# Patient Record
Sex: Female | Born: 1935 | Race: White | Hispanic: No | Smoking: Never smoker
Health system: Southern US, Community
[De-identification: ages and names within clinical notes are randomized; demographics above are authoritative.]

## PROBLEM LIST (undated history)

## (undated) DIAGNOSIS — K5792 Diverticulitis of intestine, part unspecified, without perforation or abscess without bleeding: Secondary | ICD-10-CM

## (undated) DIAGNOSIS — J449 Chronic obstructive pulmonary disease, unspecified: Secondary | ICD-10-CM

## (undated) DIAGNOSIS — I2510 Atherosclerotic heart disease of native coronary artery without angina pectoris: Secondary | ICD-10-CM

## (undated) DIAGNOSIS — H3530 Unspecified macular degeneration: Secondary | ICD-10-CM

## (undated) DIAGNOSIS — N189 Chronic kidney disease, unspecified: Secondary | ICD-10-CM

## (undated) DIAGNOSIS — I480 Paroxysmal atrial fibrillation: Secondary | ICD-10-CM

## (undated) DIAGNOSIS — A0472 Enterocolitis due to Clostridium difficile, not specified as recurrent: Secondary | ICD-10-CM

## (undated) DIAGNOSIS — K219 Gastro-esophageal reflux disease without esophagitis: Secondary | ICD-10-CM

## (undated) DIAGNOSIS — I10 Essential (primary) hypertension: Secondary | ICD-10-CM

## (undated) DIAGNOSIS — R001 Bradycardia, unspecified: Secondary | ICD-10-CM

## (undated) DIAGNOSIS — I5032 Chronic diastolic (congestive) heart failure: Secondary | ICD-10-CM

## (undated) HISTORY — DX: Atherosclerotic heart disease of native coronary artery without angina pectoris: I25.10

## (undated) HISTORY — DX: Chronic kidney disease, unspecified: N18.9

## (undated) HISTORY — DX: Chronic obstructive pulmonary disease, unspecified: J44.9

## (undated) HISTORY — DX: Chronic diastolic (congestive) heart failure: I50.32

## (undated) HISTORY — PX: APPENDECTOMY: SHX54

## (undated) HISTORY — PX: VAGINAL HYSTERECTOMY: SUR661

## (undated) HISTORY — DX: Bradycardia, unspecified: R00.1

## (undated) HISTORY — DX: Enterocolitis due to Clostridium difficile, not specified as recurrent: A04.72

## (undated) HISTORY — PX: CHOLECYSTECTOMY: SHX55

## (undated) HISTORY — PX: EYE SURGERY: SHX253

## (undated) HISTORY — DX: Paroxysmal atrial fibrillation: I48.0

---

## 1997-12-08 ENCOUNTER — Ambulatory Visit (HOSPITAL_COMMUNITY): Admission: RE | Admit: 1997-12-08 | Discharge: 1997-12-08 | Payer: Self-pay | Admitting: Internal Medicine

## 1997-12-08 ENCOUNTER — Emergency Department (HOSPITAL_COMMUNITY): Admission: EM | Admit: 1997-12-08 | Discharge: 1997-12-08 | Payer: Self-pay

## 1998-05-21 ENCOUNTER — Emergency Department (HOSPITAL_COMMUNITY): Admission: EM | Admit: 1998-05-21 | Discharge: 1998-05-21 | Payer: Self-pay | Admitting: Emergency Medicine

## 1998-05-21 ENCOUNTER — Encounter: Payer: Self-pay | Admitting: Emergency Medicine

## 1998-05-22 ENCOUNTER — Encounter: Payer: Self-pay | Admitting: Emergency Medicine

## 1998-07-12 ENCOUNTER — Ambulatory Visit (HOSPITAL_COMMUNITY): Admission: RE | Admit: 1998-07-12 | Discharge: 1998-07-12 | Payer: Self-pay | Admitting: Ophthalmology

## 1999-07-24 ENCOUNTER — Other Ambulatory Visit: Admission: RE | Admit: 1999-07-24 | Discharge: 1999-07-24 | Payer: Self-pay | Admitting: *Deleted

## 1999-12-25 ENCOUNTER — Encounter: Admission: RE | Admit: 1999-12-25 | Discharge: 1999-12-25 | Payer: Self-pay | Admitting: *Deleted

## 1999-12-25 ENCOUNTER — Encounter: Payer: Self-pay | Admitting: *Deleted

## 2001-02-25 ENCOUNTER — Ambulatory Visit (HOSPITAL_COMMUNITY): Admission: RE | Admit: 2001-02-25 | Discharge: 2001-02-25 | Payer: Self-pay | Admitting: Gastroenterology

## 2001-02-25 ENCOUNTER — Encounter: Payer: Self-pay | Admitting: Family Medicine

## 2001-07-08 ENCOUNTER — Encounter: Payer: Self-pay | Admitting: *Deleted

## 2001-07-08 ENCOUNTER — Encounter: Admission: RE | Admit: 2001-07-08 | Discharge: 2001-07-08 | Payer: Self-pay | Admitting: *Deleted

## 2001-11-11 ENCOUNTER — Ambulatory Visit (HOSPITAL_COMMUNITY): Admission: RE | Admit: 2001-11-11 | Discharge: 2001-11-11 | Payer: Self-pay | Admitting: Internal Medicine

## 2003-01-28 ENCOUNTER — Encounter: Admission: RE | Admit: 2003-01-28 | Discharge: 2003-01-28 | Payer: Self-pay | Admitting: Internal Medicine

## 2003-01-28 ENCOUNTER — Encounter: Payer: Self-pay | Admitting: Internal Medicine

## 2003-06-11 ENCOUNTER — Encounter: Admission: RE | Admit: 2003-06-11 | Discharge: 2003-06-11 | Payer: Self-pay | Admitting: Internal Medicine

## 2004-05-04 ENCOUNTER — Ambulatory Visit (HOSPITAL_COMMUNITY): Admission: RE | Admit: 2004-05-04 | Discharge: 2004-05-04 | Payer: Self-pay | Admitting: Internal Medicine

## 2005-10-20 ENCOUNTER — Emergency Department (HOSPITAL_COMMUNITY): Admission: EM | Admit: 2005-10-20 | Discharge: 2005-10-21 | Payer: Self-pay | Admitting: *Deleted

## 2005-11-19 ENCOUNTER — Encounter: Admission: RE | Admit: 2005-11-19 | Discharge: 2005-11-19 | Payer: Self-pay | Admitting: Interventional Cardiology

## 2006-07-15 ENCOUNTER — Ambulatory Visit: Payer: Self-pay | Admitting: Family Medicine

## 2006-07-15 ENCOUNTER — Encounter: Payer: Self-pay | Admitting: Family Medicine

## 2006-07-15 DIAGNOSIS — I10 Essential (primary) hypertension: Secondary | ICD-10-CM | POA: Insufficient documentation

## 2006-07-15 DIAGNOSIS — F329 Major depressive disorder, single episode, unspecified: Secondary | ICD-10-CM

## 2006-07-15 DIAGNOSIS — M7120 Synovial cyst of popliteal space [Baker], unspecified knee: Secondary | ICD-10-CM | POA: Insufficient documentation

## 2006-07-15 DIAGNOSIS — I2510 Atherosclerotic heart disease of native coronary artery without angina pectoris: Secondary | ICD-10-CM | POA: Insufficient documentation

## 2006-07-15 DIAGNOSIS — H5440 Blindness, one eye, unspecified eye: Secondary | ICD-10-CM | POA: Insufficient documentation

## 2006-07-15 DIAGNOSIS — E785 Hyperlipidemia, unspecified: Secondary | ICD-10-CM | POA: Insufficient documentation

## 2006-07-15 DIAGNOSIS — M810 Age-related osteoporosis without current pathological fracture: Secondary | ICD-10-CM | POA: Insufficient documentation

## 2006-07-15 DIAGNOSIS — K5730 Diverticulosis of large intestine without perforation or abscess without bleeding: Secondary | ICD-10-CM | POA: Insufficient documentation

## 2006-07-15 DIAGNOSIS — R159 Full incontinence of feces: Secondary | ICD-10-CM | POA: Insufficient documentation

## 2006-07-15 DIAGNOSIS — H3530 Unspecified macular degeneration: Secondary | ICD-10-CM | POA: Insufficient documentation

## 2006-12-02 ENCOUNTER — Encounter: Admission: RE | Admit: 2006-12-02 | Discharge: 2006-12-02 | Payer: Self-pay | Admitting: Family Medicine

## 2006-12-03 ENCOUNTER — Encounter (INDEPENDENT_AMBULATORY_CARE_PROVIDER_SITE_OTHER): Payer: Self-pay | Admitting: *Deleted

## 2006-12-04 ENCOUNTER — Ambulatory Visit: Payer: Self-pay | Admitting: Family Medicine

## 2006-12-04 DIAGNOSIS — G4700 Insomnia, unspecified: Secondary | ICD-10-CM

## 2006-12-04 DIAGNOSIS — N209 Urinary calculus, unspecified: Secondary | ICD-10-CM

## 2006-12-04 DIAGNOSIS — R4789 Other speech disturbances: Secondary | ICD-10-CM | POA: Insufficient documentation

## 2006-12-04 DIAGNOSIS — R079 Chest pain, unspecified: Secondary | ICD-10-CM

## 2006-12-13 ENCOUNTER — Encounter: Payer: Self-pay | Admitting: Family Medicine

## 2006-12-16 ENCOUNTER — Telehealth (INDEPENDENT_AMBULATORY_CARE_PROVIDER_SITE_OTHER): Payer: Self-pay | Admitting: *Deleted

## 2006-12-18 ENCOUNTER — Ambulatory Visit: Payer: Self-pay | Admitting: Family Medicine

## 2006-12-18 DIAGNOSIS — R197 Diarrhea, unspecified: Secondary | ICD-10-CM

## 2006-12-18 DIAGNOSIS — J158 Pneumonia due to other specified bacteria: Secondary | ICD-10-CM | POA: Insufficient documentation

## 2006-12-19 ENCOUNTER — Encounter: Payer: Self-pay | Admitting: Family Medicine

## 2006-12-30 ENCOUNTER — Ambulatory Visit: Payer: Self-pay | Admitting: Family Medicine

## 2006-12-30 ENCOUNTER — Encounter: Payer: Self-pay | Admitting: Family Medicine

## 2006-12-30 ENCOUNTER — Encounter (HOSPITAL_COMMUNITY): Admission: RE | Admit: 2006-12-30 | Discharge: 2007-03-12 | Payer: Self-pay | Admitting: Family Medicine

## 2006-12-30 ENCOUNTER — Telehealth: Payer: Self-pay | Admitting: Family Medicine

## 2006-12-30 DIAGNOSIS — R0609 Other forms of dyspnea: Secondary | ICD-10-CM

## 2006-12-30 DIAGNOSIS — E860 Dehydration: Secondary | ICD-10-CM

## 2006-12-30 DIAGNOSIS — R0989 Other specified symptoms and signs involving the circulatory and respiratory systems: Secondary | ICD-10-CM

## 2006-12-30 LAB — CONVERTED CEMR LAB
AST: 21 units/L (ref 0–37)
BUN: 14 mg/dL (ref 6–23)
Chloride: 101 meq/L (ref 96–112)
Cholesterol: 130 mg/dL (ref 0–200)
Creatinine, Ser: 0.9 mg/dL (ref 0.4–1.2)
HDL: 31.5 mg/dL — ABNORMAL LOW (ref >39.0)
LDL Cholesterol: 71 mg/dL (ref 0–99)
Potassium: 4.1 meq/L (ref 3.5–5.1)
Sodium: 137 meq/L (ref 135–145)
Total Bilirubin: 1.2 mg/dL (ref 0.3–1.2)
Total CHOL/HDL Ratio: 4.1
VLDL: 28 mg/dL (ref 0–40)

## 2006-12-31 ENCOUNTER — Ambulatory Visit: Payer: Self-pay | Admitting: Family Medicine

## 2006-12-31 LAB — CONVERTED CEMR LAB
Basophils Absolute: 0.1 10*3/uL (ref 0.0–0.1)
Basophils Relative: 0.8 % (ref 0.0–1.0)
Eosinophils Absolute: 0.3 10*3/uL (ref 0.0–0.6)
HCT: 33.1 % — ABNORMAL LOW (ref 36.0–46.0)
Hemoglobin: 11.9 g/dL — ABNORMAL LOW (ref 12.0–15.0)
Lymphocytes Relative: 16.7 % (ref 12.0–46.0)
MCHC: 35.9 g/dL (ref 30.0–36.0)
MCV: 98.6 fL (ref 78.0–100.0)
Monocytes Absolute: 0.7 10*3/uL (ref 0.2–0.7)
Neutro Abs: 6.4 10*3/uL (ref 1.4–7.7)
RBC: 3.35 M/uL — ABNORMAL LOW (ref 3.87–5.11)

## 2007-01-17 ENCOUNTER — Ambulatory Visit: Payer: Self-pay | Admitting: Family Medicine

## 2007-01-17 DIAGNOSIS — F0280 Dementia in other diseases classified elsewhere without behavioral disturbance: Secondary | ICD-10-CM | POA: Insufficient documentation

## 2007-01-17 LAB — CONVERTED CEMR LAB

## 2007-01-20 ENCOUNTER — Encounter: Payer: Self-pay | Admitting: Family Medicine

## 2007-01-20 LAB — CONVERTED CEMR LAB: TSH: 3.13 microintl units/mL (ref 0.35–5.50)

## 2007-02-03 ENCOUNTER — Telehealth: Payer: Self-pay | Admitting: Family Medicine

## 2007-02-04 ENCOUNTER — Ambulatory Visit: Payer: Self-pay | Admitting: Family Medicine

## 2007-03-04 ENCOUNTER — Encounter (INDEPENDENT_AMBULATORY_CARE_PROVIDER_SITE_OTHER): Payer: Self-pay | Admitting: *Deleted

## 2007-03-21 ENCOUNTER — Encounter: Payer: Self-pay | Admitting: Family Medicine

## 2007-09-19 ENCOUNTER — Encounter: Payer: Self-pay | Admitting: Family Medicine

## 2007-09-24 ENCOUNTER — Ambulatory Visit (HOSPITAL_COMMUNITY): Admission: RE | Admit: 2007-09-24 | Discharge: 2007-09-24 | Payer: Self-pay | Admitting: Family Medicine

## 2007-10-16 ENCOUNTER — Encounter: Admission: RE | Admit: 2007-10-16 | Discharge: 2007-11-18 | Payer: Self-pay | Admitting: Internal Medicine

## 2008-03-12 ENCOUNTER — Encounter: Admission: RE | Admit: 2008-03-12 | Discharge: 2008-03-12 | Payer: Self-pay | Admitting: Internal Medicine

## 2009-02-05 ENCOUNTER — Emergency Department (HOSPITAL_COMMUNITY): Admission: EM | Admit: 2009-02-05 | Discharge: 2009-02-06 | Payer: Self-pay | Admitting: Emergency Medicine

## 2010-04-30 ENCOUNTER — Encounter: Payer: Self-pay | Admitting: Internal Medicine

## 2010-07-10 ENCOUNTER — Other Ambulatory Visit: Payer: Self-pay | Admitting: Internal Medicine

## 2010-07-10 DIAGNOSIS — Z1231 Encounter for screening mammogram for malignant neoplasm of breast: Secondary | ICD-10-CM

## 2010-07-13 LAB — BASIC METABOLIC PANEL
BUN: 13 mg/dL (ref 6–23)
Calcium: 9.3 mg/dL (ref 8.4–10.5)
Chloride: 91 mEq/L — ABNORMAL LOW (ref 96–112)
Creatinine, Ser: 0.79 mg/dL (ref 0.4–1.2)
GFR calc Af Amer: >60 mL/min (ref >60)
GFR calc non Af Amer: >60 mL/min (ref >60)
Potassium: 4.2 mEq/L (ref 3.5–5.1)

## 2010-07-13 LAB — URINALYSIS, ROUTINE W REFLEX MICROSCOPIC
Glucose, UA: NEGATIVE mg/dL
Nitrite: NEGATIVE
Specific Gravity, Urine: 1.015 (ref 1.005–1.030)
pH: 7 (ref 5.0–8.0)

## 2010-07-13 LAB — CBC
RDW: 12 % (ref 11.5–15.5)
WBC: 5.8 10*3/uL (ref 4.0–10.5)

## 2010-07-13 LAB — TROPONIN I: Troponin I: 0.01 ng/mL (ref 0.00–0.06)

## 2010-07-13 LAB — CK TOTAL AND CKMB (NOT AT ARMC)
CK, MB: 3.8 ng/mL (ref 0.3–4.0)
Relative Index: INVALID (ref 0.0–2.5)
Total CK: 69 U/L (ref 7–177)

## 2010-07-13 LAB — DIFFERENTIAL
Eosinophils Absolute: 0.2 10*3/uL (ref 0.0–0.7)
Lymphs Abs: 1.1 10*3/uL (ref 0.7–4.0)
Monocytes Relative: 13 % — ABNORMAL HIGH (ref 3–12)
Neutro Abs: 3.8 10*3/uL (ref 1.7–7.7)

## 2010-07-13 LAB — GLUCOSE, CAPILLARY: Glucose-Capillary: 113 mg/dL — ABNORMAL HIGH (ref 70–99)

## 2010-07-13 LAB — URINE MICROSCOPIC-ADD ON

## 2010-08-01 ENCOUNTER — Ambulatory Visit
Admission: RE | Admit: 2010-08-01 | Discharge: 2010-08-01 | Disposition: A | Discharge status: Home / Self Care | Payer: MEDICARE | Source: Ambulatory Visit | Attending: Internal Medicine | Admitting: Internal Medicine

## 2010-08-01 DIAGNOSIS — Z1231 Encounter for screening mammogram for malignant neoplasm of breast: Secondary | ICD-10-CM

## 2010-12-27 ENCOUNTER — Other Ambulatory Visit: Payer: Self-pay | Admitting: Internal Medicine

## 2010-12-27 DIAGNOSIS — R1013 Epigastric pain: Secondary | ICD-10-CM

## 2010-12-28 ENCOUNTER — Ambulatory Visit
Admission: RE | Admit: 2010-12-28 | Discharge: 2010-12-28 | Disposition: A | Discharge status: Home / Self Care | Payer: Medicare Other | Source: Ambulatory Visit | Attending: Internal Medicine | Admitting: Internal Medicine

## 2010-12-28 DIAGNOSIS — R1013 Epigastric pain: Secondary | ICD-10-CM

## 2011-01-14 ENCOUNTER — Emergency Department (HOSPITAL_COMMUNITY): Payer: Medicare Other

## 2011-01-14 ENCOUNTER — Inpatient Hospital Stay (HOSPITAL_COMMUNITY)
Admission: EM | Admit: 2011-01-14 | Discharge: 2011-01-17 | DRG: 392 | Disposition: A | Discharge status: Home Health | Payer: Medicare Other | Attending: Internal Medicine | Admitting: Internal Medicine

## 2011-01-14 ENCOUNTER — Inpatient Hospital Stay (INDEPENDENT_AMBULATORY_CARE_PROVIDER_SITE_OTHER)
Admission: RE | Admit: 2011-01-14 | Discharge: 2011-01-14 | Disposition: A | Discharge status: Home / Self Care | Payer: Medicare Other | Source: Ambulatory Visit | Attending: Emergency Medicine | Admitting: Emergency Medicine

## 2011-01-14 DIAGNOSIS — F329 Major depressive disorder, single episode, unspecified: Secondary | ICD-10-CM | POA: Diagnosis present

## 2011-01-14 DIAGNOSIS — Z79899 Other long term (current) drug therapy: Secondary | ICD-10-CM

## 2011-01-14 DIAGNOSIS — R109 Unspecified abdominal pain: Secondary | ICD-10-CM

## 2011-01-14 DIAGNOSIS — I2510 Atherosclerotic heart disease of native coronary artery without angina pectoris: Secondary | ICD-10-CM | POA: Diagnosis present

## 2011-01-14 DIAGNOSIS — F3289 Other specified depressive episodes: Secondary | ICD-10-CM | POA: Diagnosis present

## 2011-01-14 DIAGNOSIS — K5732 Diverticulitis of large intestine without perforation or abscess without bleeding: Principal | ICD-10-CM | POA: Diagnosis present

## 2011-01-14 DIAGNOSIS — Z7902 Long term (current) use of antithrombotics/antiplatelets: Secondary | ICD-10-CM

## 2011-01-14 DIAGNOSIS — I10 Essential (primary) hypertension: Secondary | ICD-10-CM | POA: Diagnosis present

## 2011-01-14 DIAGNOSIS — M1990 Unspecified osteoarthritis, unspecified site: Secondary | ICD-10-CM | POA: Diagnosis present

## 2011-01-14 DIAGNOSIS — E785 Hyperlipidemia, unspecified: Secondary | ICD-10-CM | POA: Diagnosis present

## 2011-01-14 DIAGNOSIS — K5909 Other constipation: Secondary | ICD-10-CM | POA: Diagnosis present

## 2011-01-14 DIAGNOSIS — K219 Gastro-esophageal reflux disease without esophagitis: Secondary | ICD-10-CM | POA: Diagnosis present

## 2011-01-14 LAB — URINALYSIS, ROUTINE W REFLEX MICROSCOPIC
Bilirubin Urine: NEGATIVE
Ketones, ur: NEGATIVE mg/dL
Nitrite: NEGATIVE
Urobilinogen, UA: 0.2 mg/dL (ref 0.0–1.0)

## 2011-01-14 LAB — CBC
HCT: 33.3 % — ABNORMAL LOW (ref 36.0–46.0)
MCHC: 35.1 g/dL (ref 30.0–36.0)
MCV: 94.6 fL (ref 78.0–100.0)
Platelets: 219 10*3/uL (ref 150–400)
RBC: 3.52 MIL/uL — ABNORMAL LOW (ref 3.87–5.11)
RDW: 11.9 % (ref 11.5–15.5)

## 2011-01-14 LAB — DIFFERENTIAL
Basophils Absolute: 0 10*3/uL (ref 0.0–0.1)
Eosinophils Relative: 2 % (ref 0–5)
Lymphocytes Relative: 18 % (ref 12–46)
Lymphs Abs: 1.5 10*3/uL (ref 0.7–4.0)
Monocytes Absolute: 0.9 10*3/uL (ref 0.1–1.0)
Neutrophils Relative %: 70 % (ref 43–77)

## 2011-01-14 LAB — COMPREHENSIVE METABOLIC PANEL
Albumin: 3.3 g/dL — ABNORMAL LOW (ref 3.5–5.2)
Alkaline Phosphatase: 73 U/L (ref 39–117)
BUN: 10 mg/dL (ref 6–23)
CO2: 29 mEq/L (ref 19–32)
Calcium: 9.5 mg/dL (ref 8.4–10.5)
Creatinine, Ser: 0.69 mg/dL (ref 0.50–1.10)
GFR calc Af Amer: 86 mL/min — ABNORMAL LOW (ref >90)
GFR calc non Af Amer: 74 mL/min — ABNORMAL LOW (ref >90)
Glucose, Bld: 99 mg/dL (ref 70–99)

## 2011-01-14 LAB — POCT I-STAT TROPONIN I: Troponin i, poc: 0 ng/mL (ref 0.00–0.08)

## 2011-01-14 MED ORDER — IOHEXOL 300 MG/ML  SOLN
100.0000 mL | Freq: Once | INTRAMUSCULAR | Status: AC | PRN
  Administered 2011-01-14: 100 mL via INTRAVENOUS

## 2011-01-15 LAB — COMPREHENSIVE METABOLIC PANEL
ALT: 11 U/L (ref 0–35)
AST: 21 U/L (ref 0–37)
Albumin: 3.1 g/dL — ABNORMAL LOW (ref 3.5–5.2)
CO2: 28 mEq/L (ref 19–32)
Calcium: 9.2 mg/dL (ref 8.4–10.5)
Chloride: 101 mEq/L (ref 96–112)
Creatinine, Ser: 0.64 mg/dL (ref 0.50–1.10)
GFR calc non Af Amer: 76 mL/min — ABNORMAL LOW (ref >90)
Sodium: 138 mEq/L (ref 135–145)

## 2011-01-15 LAB — CBC
MCH: 32.7 pg (ref 26.0–34.0)
Platelets: 219 10*3/uL (ref 150–400)
RBC: 3.46 MIL/uL — ABNORMAL LOW (ref 3.87–5.11)
RDW: 11.8 % (ref 11.5–15.5)
WBC: 9.5 10*3/uL (ref 4.0–10.5)

## 2011-01-15 LAB — CK TOTAL AND CKMB (NOT AT ARMC)
CK, MB: 4.4 ng/mL — ABNORMAL HIGH (ref 0.3–4.0)
Relative Index: INVALID (ref 0.0–2.5)
Total CK: 59 U/L (ref 7–177)

## 2011-01-15 LAB — CARDIAC PANEL(CRET KIN+CKTOT+MB+TROPI)
CK, MB: 3.6 ng/mL (ref 0.3–4.0)
Relative Index: INVALID (ref 0.0–2.5)
Troponin I: <0.3 ng/mL (ref <0.30)

## 2011-01-15 LAB — TSH: TSH: 5.612 u[IU]/mL — ABNORMAL HIGH (ref 0.350–4.500)

## 2011-01-16 LAB — URINE CULTURE: Colony Count: 15000

## 2011-01-17 ENCOUNTER — Encounter (INDEPENDENT_AMBULATORY_CARE_PROVIDER_SITE_OTHER): Payer: Medicare Other | Admitting: Ophthalmology

## 2011-01-17 LAB — BASIC METABOLIC PANEL
GFR calc Af Amer: 66 mL/min — ABNORMAL LOW (ref >90)
GFR calc non Af Amer: 57 mL/min — ABNORMAL LOW (ref >90)
Potassium: 3.1 mEq/L — ABNORMAL LOW (ref 3.5–5.1)
Sodium: 138 mEq/L (ref 135–145)

## 2011-01-19 NOTE — Discharge Summary (Signed)
NAMEMarland Kitchen  Laura Zuniga, Laura Zuniga              ACCOUNT NO.:  [id number]  MEDICAL RECORD NO.:  [id number]  LOCATION:  4704                         FACILITY:  [facility]  PHYSICIAN:  Georgann Housekeeper, MD      DATE OF BIRTH:  12-11-1917  DATE OF ADMISSION:  01/14/2011 DATE OF DISCHARGE:  01/17/2011                              DISCHARGE SUMMARY   DISCHARGE DIAGNOSES: 1. Acute diverticulitis of the sigmoid colon. 2. History of coronary artery disease. 3. Depression. 4. Osteoarthritis. 5. Hyperlipidemia. 6. Gastroesophageal reflux disease. 7. Constipation.  MEDICATION ON DISCHARGE: 1. Diovan 160 mg p.o. daily. 2. Isosorbide 30 mg (Imdur) 1 tablet daily. 3. Protonix 40 mg daily. 4. Pravachol 40 mg daily. 5. Plavix 75 mg p.o. daily. 6. Lexapro 5 mg p.o. daily. 7. Cipro 500 mg b.i.d. for 10 days. 8. Flagyl 500 mg b.i.d. for 10 days. 9. MiraLax 17 g b.i.d. 10.Continue her I-CAP vitamins, vitamin C, and cranberry fruit daily.  ALLERGIES:  ACE INHIBITORS, SULFA, and PENICILLIN.  LABORATORY DATA:  Blood count, white count of 9.5, hemoglobin 11.3. Chemistries, potassium was 3.1, sodium 138.  On admission, potassium was 4.4, creatinine 0.86, glucose of 99.  Cardiac markers negative x2.  GI, LFTs normal.  Urine culture negative.  Blood culture negative.  UA was negative.  RADIOLOGY:  CT scan of the abdomen and pelvis showed sigmoid diverticulitis without any abscess or perforation.  CONSULTATION:  GI, Dr. Carman Ching.  HOSPITAL COURSE: 36. A 77 year old female admitted with abdominal pain, intermittent,     was found to have on CT scan acute diverticulitis.  The patient was     started on IV antibiotics.  GI was consulted.  She had a history of     chronic constipation, MiraLax was started.  The patient tolerated     the antibiotics well with Cipro and Flagyl.  Her white count     remained stable.  She remained afebrile.  Antibiotic will be     switched to p.o. Cipro and Flagyl for 10 days.   Given her age and     acute episode, GI recommended not to do any colon evaluation at     this point.  I treat conservatively with antibiotics and MiraLax.     The patient had some lower abdominal pain.  Continue Tylenol p.r.n.     Avoid any narcotics because of the worsening of constipation     problem.  The patient will be followed up with me in 1 week.  GI     followup if needed. 2. Hypertension and CAD, medically managed, continue on her current     medication.  She was on telemetry, remained in normal sinus rhythm.     Blood pressure was mildly elevated at the time of admission, but     improved during the hospital course. 3. History of depression, continue on Lexapro 5 mg.  She does have     little bit of confusion episodes in the hospital.  No clear     evidence of dementia.  She does have some memory impairment.  As     far as the GERD, continue on PPI as far as we will arrange  for some     home health and follow up in 1 week in the office and she will     follow up with GI if needed with Dr. Carman Ching.  I discussed     the case in her discharge planning with her daughter who was     present in the exam room with the patient and agree with the plan     of care.  Follow up in 1 week.     Georgann Housekeeper, MD     KH/MEDQ  D:  01/17/2011  T:  01/17/2011  Job:  [id number]  cc:   Fayrene Fearing L. Malon Kindle., M.D.  Electronically Signed by Georgann Housekeeper MD on 01/19/2011 09:22:49 PM

## 2011-01-20 NOTE — H&P (Signed)
NAMEMarland Kitchen  Laura Zuniga, Laura Zuniga              ACCOUNT NO.:  [id number]  MEDICAL RECORD NO.:  [id number]  LOCATION:  [facility]                         FACILITY:  [facility]  PHYSICIAN:  Lonia Blood, M.D.      DATE OF BIRTH:  April 20, 1917  DATE OF ADMISSION:  01/14/2011 DATE OF DISCHARGE:                             HISTORY & PHYSICAL   PRIMARY CARE PHYSICIAN:  Georgann Housekeeper, MD  PRESENTING COMPLAINT:  Nausea, vomiting, abdominal pain, and diarrhea.  HISTORY OF PRESENT ILLNESS:  The patient is a 77 year old female that presented with complaint of nausea, vomiting, and diarrhea.  She has also abdominal pain.  This has been going on, on and off for almost 5-6 weeks.  She has tried a couple of medications, mainly over the counter with no relief.  Her abdominal pain is so far has been 5-6, but the nausea, vomiting, and diarrhea is escalating in the last couple of days, especially since yesterday.  She has not been able to keep much down initially, but today she was able to keep food down.  However, due to the intermittent and worsening nature of her abdominal pain, they brought her to the emergency room.  Her pain is currently rated as 10/10 at its height, but has gotten better with medication here and down to 3/10.  No melena, no bright red blood per rectum, no hematemesis.  The abdominal pain is centrally located, is colicky in nature.  No significant radiation.  The patient denied any particular aggravating or relieving factors.  She has had history of diverticular disease and GERD, but this does not sound like her GERD-like symptoms.  PAST MEDICAL HISTORY:  Significant for: 1. Coronary artery disease. 2. Hypertension. 3. Diverticular disease. 4. GERD. 5. Hyperlipidemia.  ALLERGIES: 1. PENICILLIN. 2. SULFA.  CURRENT MEDICATIONS:  Diovan, isosorbide mononitrate, Lexapro, Plavix, Pravachol, and Protonix.  SOCIAL HISTORY:  The patient is a retired Engineer, civil (consulting).  She lives in [place].  She denied  tobacco, alcohol, or IV drug use.  She is remarkably strong, able to move all her ADLs, moving around, does not require much assistance.  FAMILY HISTORY:  Noncontributory due to her age.  REVIEW OF SYSTEMS:  All systems reviewed are negative except per HPI.  PHYSICAL EXAMINATION:  VITAL SIGNS:  Temperature is 97.5, her blood pressure today is 193/53 with a pulse of 65, respiratory rate 20, and sats 98% on room air. GENERAL:  She is awake, alert, oriented.  She is in no acute distress. HEENT:  PERRL.  EOMI.  No pallor, no jaundice, no rhinorrhea. NECK:  Supple.  No JVD, no lymphadenopathy. RESPIRATORY:  She has good air entry bilaterally.  No significant wheeze, no rales, no crackles. CARDIOVASCULAR:  She has S1 and S2.  No audible murmur. ABDOMEN:  Soft, full with mild left lower quadrant abdominal tenderness. Positive bowel sounds. EXTREMITIES:  No edema, cyanosis, or clubbing.  Stool guaiac is currently pending. SKIN:  No rashes.  No ulcers.  LABS:  White count is 8.6, hemoglobin 11.7 with platelet count of 219 and normal differential.  Sodium is 136, potassium 4.2, chloride 100, CO2 of 29, glucose 99, BUN 10, creatinine is 0.69.  Her total bilirubin is  0.3, alkaline phosphatase 73, albumin 3.3.  Initial cardiac enzymes so far negative.  Urinalysis showed moderate leukocyte esterase and small blood.  Urine microscopy shows wbc 7-10 and rare bacteria.  ASSESSMENT:  This is a 77 year old female presenting with acute sigmoid diverticulitis according to the CT of abdomen and pelvis that showed no free air or abscess.  Also, she has bacteriuria in addition to her chronic medical problems which include coronary artery disease, hypertension, gastroesophageal reflux disease, and hyperlipidemia.  PLAN: 1. Acute sigmoid diverticulitis.  We will admit the patient for bowel     rest.  Keep her only on clear liquids.  Pain control, IV Cipro,     Flagyl.  We will continue her PPIs. 2.  Possible UTI.  Again, she is going to be on Cipro.  Hopefully, her     bacteriuria is sensitive to Cipro and this will take care of this. 3. Coronary artery disease.  No chest pain.  No evidence of acute     coronary syndrome.  I will admit her to tele unit for monitoring,     cycle her enzymes. 4. Hypertension.  Blood pressure is elevated.  I will resume her home     medicine, but in the meantime I will use IV hydralazine due to her     low heart rate.  I will use hydralazine p.r.n. for any systolic     above 160. 5. GERD.  Continue with PPI. 6. Hyperlipidemia.  Continue with statin.     Lonia Blood, M.D.     Laura Zuniga  D:  01/15/2011  T:  01/15/2011  Job:  [id number]  Electronically Signed by Lonia Blood M.D. on 01/20/2011 78:29:56 PM

## 2011-01-21 LAB — CULTURE, BLOOD (ROUTINE X 2)
Culture  Setup Time: 201210081054
Culture: NO GROWTH

## 2011-01-25 NOTE — Consult Note (Signed)
  NAMEMarland Zuniga  Laura, Zuniga              ACCOUNT NO.:  [id number]  MEDICAL RECORD NO.:  [id number]  LOCATION:  [facility]G                          FACILITY:  [facility]  PHYSICIAN:  Winifred Bodiford L. Malon Kindle., M.D.DATE OF BIRTH:  Jul 06, 1917  DATE OF CONSULTATION:  01/15/2011 DATE OF DISCHARGE:  01/14/2011                                CONSULTATION   REQUESTING PHYSICIAN:  Georgann Housekeeper, MD  REASON FOR CONSULTATION:  Diverticulitis.  HISTORY:  The patient is a 77 year old woman who lives independently in her own home.  She has had several prior episodes of presumed clinical diverticulitis treated with outpatient antibiotics.  She was admitted to the hospital with 5-6 weeks of abdominal pain that would come and go. She is chronically constipated and takes some type of over-the-counter pills.  She developed worsening abdominal pain, nausea and vomiting and was admitted to the hospital.  CT scan of her abdomen showed active sigmoid diverticulitis with some stranding but no abscess or no signs of perforation.  The patient has now received about 24 hours of antibiotics.  CURRENT MEDICATIONS:  Cipro and Flagyl here in the hospital.  OUTPATIENT MEDICATIONS:  Diovan, isosorbide mononitrate, Lexapro, Plavix, Pravachol, and Protonix.  ALLERGIC:  She is allergic to PENICILLIN and SULFA.  PAST MEDICAL HISTORY:  She does have a history of coronary artery disease and hypertension.  She has reflux and hyperlipidemia.  FAMILY HISTORY:  Negative for colon cancer, but is positive for breast cancer.  PREVIOUS SURGERIES:  She has had her gallbladder out and has had a hysterectomy, some type of skin lesion.  SOCIAL HISTORY:  She is a retired Engineer, civil (consulting).  She lives in a house independently and is able to take care of herself and do her house work, etc.  PHYSICAL EXAMINATION:  VITAL SIGNS:  Temperature 98.3, pulse 69, blood pressure 179/69. GENERAL:  Alert, talkative, elderly white female, who appears much younger  than her stated age. EYES:  Sclera nonicteric. NECK:  Supple.  No lymphadenopathy. LUNGS:  Clear. HEART:  Regular rate and rhythm.  No murmurs, rubs or gallops. ABDOMEN:  Nondistended with good bowel sounds with mild tenderness in the left lower quadrant.  ASSESSMENT:  Diverticulitis is demonstrated on CT.  There is no worrisome signs.  She is chronically constipated and attention to her constipation would be appropriate.  RECOMMENDATIONS: 1. Would not recommend colonoscopy now.  I have discussed this with     the daughter.  This would place her at increased risk due to air     insufflation etc.  We could consider this in a month or so, but for     now would go ahead and continue to treat her with antibiotics. 2. We will start her on MiraLax here in the hospital and continue that     chronically as an outpatient to control her constipation.          ______________________________ Llana Aliment Malon Kindle., M.D.     Waldron Session  D:  01/15/2011  T:  01/15/2011  Job:  [id number]  cc:   Georgann Housekeeper, MD  Electronically Signed by Carman Ching M.D. on 01/25/2011 12:25:48 PM

## 2011-05-11 ENCOUNTER — Ambulatory Visit (INDEPENDENT_AMBULATORY_CARE_PROVIDER_SITE_OTHER): Payer: Medicare Other | Admitting: Ophthalmology

## 2011-05-11 DIAGNOSIS — H26499 Other secondary cataract, unspecified eye: Secondary | ICD-10-CM

## 2011-05-11 DIAGNOSIS — H43819 Vitreous degeneration, unspecified eye: Secondary | ICD-10-CM

## 2011-05-11 DIAGNOSIS — H3530 Unspecified macular degeneration: Secondary | ICD-10-CM

## 2011-05-21 ENCOUNTER — Ambulatory Visit (INDEPENDENT_AMBULATORY_CARE_PROVIDER_SITE_OTHER): Payer: Medicare Other | Admitting: Ophthalmology

## 2011-05-21 DIAGNOSIS — H2700 Aphakia, unspecified eye: Secondary | ICD-10-CM

## 2011-05-21 DIAGNOSIS — H3530 Unspecified macular degeneration: Secondary | ICD-10-CM

## 2011-06-28 ENCOUNTER — Other Ambulatory Visit: Payer: Self-pay

## 2011-06-28 ENCOUNTER — Emergency Department (HOSPITAL_COMMUNITY): Payer: Medicare Other

## 2011-06-28 ENCOUNTER — Inpatient Hospital Stay (HOSPITAL_COMMUNITY)
Admission: EM | Admit: 2011-06-28 | Discharge: 2011-06-29 | DRG: 312 | Disposition: A | Discharge status: Home Health | Payer: Medicare Other | Attending: Internal Medicine | Admitting: Internal Medicine

## 2011-06-28 ENCOUNTER — Encounter (HOSPITAL_COMMUNITY): Payer: Self-pay | Admitting: Emergency Medicine

## 2011-06-28 DIAGNOSIS — J449 Chronic obstructive pulmonary disease, unspecified: Secondary | ICD-10-CM | POA: Diagnosis present

## 2011-06-28 DIAGNOSIS — R55 Syncope and collapse: Secondary | ICD-10-CM | POA: Diagnosis present

## 2011-06-28 DIAGNOSIS — I509 Heart failure, unspecified: Secondary | ICD-10-CM | POA: Diagnosis present

## 2011-06-28 DIAGNOSIS — M7989 Other specified soft tissue disorders: Secondary | ICD-10-CM | POA: Diagnosis present

## 2011-06-28 DIAGNOSIS — F32A Depression, unspecified: Secondary | ICD-10-CM | POA: Diagnosis present

## 2011-06-28 DIAGNOSIS — F329 Major depressive disorder, single episode, unspecified: Secondary | ICD-10-CM | POA: Diagnosis present

## 2011-06-28 DIAGNOSIS — Z8719 Personal history of other diseases of the digestive system: Secondary | ICD-10-CM

## 2011-06-28 DIAGNOSIS — N390 Urinary tract infection, site not specified: Secondary | ICD-10-CM | POA: Diagnosis present

## 2011-06-28 DIAGNOSIS — I10 Essential (primary) hypertension: Secondary | ICD-10-CM | POA: Diagnosis present

## 2011-06-28 DIAGNOSIS — J4489 Other specified chronic obstructive pulmonary disease: Secondary | ICD-10-CM | POA: Diagnosis present

## 2011-06-28 DIAGNOSIS — F3289 Other specified depressive episodes: Secondary | ICD-10-CM | POA: Diagnosis present

## 2011-06-28 DIAGNOSIS — I5032 Chronic diastolic (congestive) heart failure: Secondary | ICD-10-CM | POA: Diagnosis present

## 2011-06-28 DIAGNOSIS — I2510 Atherosclerotic heart disease of native coronary artery without angina pectoris: Secondary | ICD-10-CM | POA: Diagnosis present

## 2011-06-28 DIAGNOSIS — E785 Hyperlipidemia, unspecified: Secondary | ICD-10-CM | POA: Diagnosis present

## 2011-06-28 HISTORY — DX: Gastro-esophageal reflux disease without esophagitis: K21.9

## 2011-06-28 HISTORY — DX: Diverticulitis of intestine, part unspecified, without perforation or abscess without bleeding: K57.92

## 2011-06-28 HISTORY — DX: Essential (primary) hypertension: I10

## 2011-06-28 LAB — BASIC METABOLIC PANEL
Chloride: 96 mEq/L (ref 96–112)
Creatinine, Ser: 0.82 mg/dL (ref 0.50–1.10)
GFR calc Af Amer: 70 mL/min — ABNORMAL LOW (ref >90)
Potassium: 4.3 mEq/L (ref 3.5–5.1)

## 2011-06-28 LAB — URINALYSIS, ROUTINE W REFLEX MICROSCOPIC
Bilirubin Urine: NEGATIVE
Ketones, ur: NEGATIVE mg/dL
Nitrite: NEGATIVE
Urobilinogen, UA: 0.2 mg/dL (ref 0.0–1.0)

## 2011-06-28 LAB — CBC
HCT: 32.8 % — ABNORMAL LOW (ref 36.0–46.0)
MCH: 33.1 pg (ref 26.0–34.0)
MCHC: 35 g/dL (ref 30.0–36.0)
MCHC: 35.1 g/dL (ref 30.0–36.0)
Platelets: 188 10*3/uL (ref 150–400)
RDW: 11.8 % (ref 11.5–15.5)

## 2011-06-28 LAB — DIFFERENTIAL
Basophils Absolute: 0 10*3/uL (ref 0.0–0.1)
Basophils Relative: 0 % (ref 0–1)
Monocytes Absolute: 0.6 10*3/uL (ref 0.1–1.0)
Neutro Abs: 6.2 10*3/uL (ref 1.7–7.7)

## 2011-06-28 LAB — URINE MICROSCOPIC-ADD ON

## 2011-06-28 LAB — MAGNESIUM: Magnesium: 1.7 mg/dL (ref 1.5–2.5)

## 2011-06-28 LAB — POCT I-STAT TROPONIN I: Troponin i, poc: 0 ng/mL (ref 0.00–0.08)

## 2011-06-28 LAB — COMPREHENSIVE METABOLIC PANEL
Alkaline Phosphatase: 73 U/L (ref 39–117)
BUN: 12 mg/dL (ref 6–23)
Calcium: 8.8 mg/dL (ref 8.4–10.5)
Creatinine, Ser: 0.73 mg/dL (ref 0.50–1.10)
GFR calc Af Amer: 84 mL/min — ABNORMAL LOW (ref >90)
Glucose, Bld: 116 mg/dL — ABNORMAL HIGH (ref 70–99)
Potassium: 4.1 mEq/L (ref 3.5–5.1)
Total Protein: 6.4 g/dL (ref 6.0–8.3)

## 2011-06-28 LAB — PHOSPHORUS: Phosphorus: 3 mg/dL (ref 2.3–4.6)

## 2011-06-28 LAB — CARDIAC PANEL(CRET KIN+CKTOT+MB+TROPI)
CK, MB: 4.5 ng/mL — ABNORMAL HIGH (ref 0.3–4.0)
Troponin I: <0.3 ng/mL (ref <0.30)

## 2011-06-28 LAB — APTT: aPTT: 31 seconds (ref 24–37)

## 2011-06-28 LAB — PROTIME-INR: INR: 1.06 (ref 0.00–1.49)

## 2011-06-28 MED ORDER — SODIUM CHLORIDE 0.9 % IV BOLUS (SEPSIS)
500.0000 mL | Freq: Once | INTRAVENOUS | Status: AC
  Administered 2011-06-28: 500 mL via INTRAVENOUS

## 2011-06-28 MED ORDER — CIPROFLOXACIN IN D5W 400 MG/200ML IV SOLN
400.0000 mg | INTRAVENOUS | Status: DC
  Administered 2011-06-29: 400 mg via INTRAVENOUS
  Filled 2011-06-28 (×2): qty 200

## 2011-06-28 MED ORDER — SIMVASTATIN 20 MG PO TABS
20.0000 mg | ORAL_TABLET | Freq: Every day | ORAL | Status: DC
  Filled 2011-06-28: qty 1

## 2011-06-28 MED ORDER — ADULT MULTIVITAMIN W/MINERALS CH
1.0000 | ORAL_TABLET | Freq: Every day | ORAL | Status: DC
  Administered 2011-06-29: 1 via ORAL
  Filled 2011-06-28: qty 1

## 2011-06-28 MED ORDER — VITAMIN C 500 MG PO TABS
500.0000 mg | ORAL_TABLET | Freq: Every day | ORAL | Status: DC
  Administered 2011-06-29: 500 mg via ORAL
  Filled 2011-06-28: qty 1

## 2011-06-28 MED ORDER — IPRATROPIUM BROMIDE 0.02 % IN SOLN: 0.5000 mg | Freq: Four times a day (QID) | RESPIRATORY_TRACT | Status: DC | PRN

## 2011-06-28 MED ORDER — CALCIUM CARBONATE-VITAMIN D 500-200 MG-UNIT PO TABS
1.0000 | ORAL_TABLET | Freq: Every day | ORAL | Status: DC
  Administered 2011-06-29: 1 via ORAL
  Filled 2011-06-28: qty 1

## 2011-06-28 MED ORDER — IRBESARTAN 150 MG PO TABS
150.0000 mg | ORAL_TABLET | Freq: Every day | ORAL | Status: DC
  Administered 2011-06-29: 150 mg via ORAL
  Filled 2011-06-28 (×2): qty 1

## 2011-06-28 MED ORDER — FUROSEMIDE 40 MG PO TABS
40.0000 mg | ORAL_TABLET | Freq: Every day | ORAL | Status: DC
  Administered 2011-06-29: 40 mg via ORAL
  Filled 2011-06-28 (×2): qty 1

## 2011-06-28 MED ORDER — SODIUM CHLORIDE 0.9 % IV SOLN
INTRAVENOUS | Status: DC
  Administered 2011-06-29: via INTRAVENOUS

## 2011-06-28 MED ORDER — ENOXAPARIN SODIUM 30 MG/0.3ML ~~LOC~~ SOLN
30.0000 mg | Freq: Every day | SUBCUTANEOUS | Status: DC
  Administered 2011-06-29: 30 mg via SUBCUTANEOUS
  Filled 2011-06-28 (×3): qty 0.3

## 2011-06-28 MED ORDER — ALBUTEROL SULFATE (5 MG/ML) 0.5% IN NEBU: 2.5000 mg | INHALATION_SOLUTION | Freq: Four times a day (QID) | RESPIRATORY_TRACT | Status: DC | PRN

## 2011-06-28 MED ORDER — CLOPIDOGREL BISULFATE 75 MG PO TABS
75.0000 mg | ORAL_TABLET | Freq: Every day | ORAL | Status: DC
  Administered 2011-06-29: 75 mg via ORAL
  Filled 2011-06-28: qty 1

## 2011-06-28 MED ORDER — FERROUS SULFATE 325 (65 FE) MG PO TABS
325.0000 mg | ORAL_TABLET | Freq: Every day | ORAL | Status: DC
  Administered 2011-06-29: 325 mg via ORAL
  Filled 2011-06-28: qty 1

## 2011-06-28 MED ORDER — POLYETHYLENE GLYCOL 3350 17 G PO PACK
17.0000 g | PACK | Freq: Every day | ORAL | Status: DC
  Administered 2011-06-29: 17 g via ORAL
  Filled 2011-06-28: qty 1

## 2011-06-28 MED ORDER — PANTOPRAZOLE SODIUM 40 MG PO TBEC
40.0000 mg | DELAYED_RELEASE_TABLET | Freq: Every day | ORAL | Status: DC
  Administered 2011-06-29: 40 mg via ORAL
  Filled 2011-06-28: qty 1

## 2011-06-28 MED ORDER — VITAMIN E 180 MG (400 UNIT) PO CAPS
400.0000 [IU] | ORAL_CAPSULE | Freq: Every day | ORAL | Status: DC
Start: 2011-06-29 — End: 2011-06-29
  Administered 2011-06-29: 400 [IU] via ORAL
  Filled 2011-06-28: qty 1

## 2011-06-28 MED ORDER — CYANOCOBALAMIN 500 MCG PO TABS
500.0000 ug | ORAL_TABLET | Freq: Every day | ORAL | Status: DC
  Administered 2011-06-29: 500 ug via ORAL
  Filled 2011-06-28: qty 1

## 2011-06-28 MED ORDER — SODIUM CHLORIDE 0.9 % IJ SOLN
3.0000 mL | Freq: Two times a day (BID) | INTRAMUSCULAR | Status: DC
  Administered 2011-06-29: 3 mL via INTRAVENOUS

## 2011-06-28 MED ORDER — ESCITALOPRAM OXALATE 5 MG PO TABS
5.0000 mg | ORAL_TABLET | Freq: Every day | ORAL | Status: DC
  Administered 2011-06-29: 5 mg via ORAL
  Filled 2011-06-28: qty 1

## 2011-06-28 MED ORDER — ISOSORBIDE MONONITRATE ER 30 MG PO TB24
30.0000 mg | ORAL_TABLET | Freq: Every day | ORAL | Status: DC
  Administered 2011-06-29: 30 mg via ORAL
  Filled 2011-06-28: qty 1

## 2011-06-28 MED ORDER — FUROSEMIDE 40 MG PO TABS: 40.0000 mg | ORAL_TABLET | Freq: Every day | ORAL | Status: DC

## 2011-06-28 NOTE — ED Notes (Signed)
EKG given to EDP, Linker,MD. 

## 2011-06-28 NOTE — ED Notes (Signed)
Per EMS: Pt became lightheaded, pale, diaphoretic. Sat down, called to daughter, was found "unresponsive" for "20 sec" eyes open during that time. Then awoke and vomited x 2, x3 for EMS. Initial pressure 84/52, hr 64. PIV in right hand, received . 12 lead showed NSR. En route began feeling better, last bp 150/70, hr 65-70. A&O on arrival.

## 2011-06-28 NOTE — ED Provider Notes (Signed)
History     CSN: [id number]  Arrival date & time 06/28/11  1638   First MD Initiated Contact with Patient 06/28/11 1746      Chief Complaint  Patient presents with  . Loss of Consciousness  . Emesis    (Consider location/radiation/quality/duration/timing/severity/associated sxs/prior treatment) HPI Patient presenting after her episode of syncope. Patient states that she felt lightheaded prior to this event and also nauseated. She does not remember the event but remembers afterwards she had 2 episodes of emesis. Family members state that she called out to them stating that she felt that she might pass out. They then saw that she had lost consciousness and this resolved spontaneously after several minutes. She did not have any seizure activity reported during this episode. They do agree that afterward she had multiple episodes of emesis but did seem to come back to her normal mental status rather quickly. Patient states that over the past several days she's been lightheaded upon standing. She has been taking Lasix for the past 2 months and last night took an extra dose. She denies any fever or chills no nausea vomiting or diarrhea other than today surrounding the episode of syncope.  There are no other alleviating or modifying factors.  There are no other associated systemic symptoms.      Past Medical History  Diagnosis Date  . CHF (congestive heart failure)   . GERD (gastroesophageal reflux disease)   . Hypertension   . Diverticulitis   . UTI (urinary tract infection)   . Blockage of coronary artery of heart     Past Surgical History  Procedure Date  . Cholecystectomy   . Appendectomy     History reviewed. No pertinent family history.  History  Substance Use Topics  . Smoking status: Never Smoker   . Smokeless tobacco: Not on file  . Alcohol Use: No    OB History    Grav Para Term Preterm Abortions TAB SAB Ect Mult Living                  Review of Systems ROS  reviewed and otherwise negative except for mentioned in HPI  Allergies  Penicillins and Sulfonamide derivatives  Home Medications   No current outpatient prescriptions on file.  BP 169/90  Pulse 71  Temp(Src) 98.7 F (37.1 C) (Oral)  Resp 24  Ht 5' (1.524 m)  Wt 142 lb 1.6 oz (64.456 kg)  BMI 27.75 kg/m2  SpO2 98% Vitals reviewed Physical Exam Physical Examination: General appearance - alert, well appearing, and in no distress Mental status - alert, oriented to person, place, and time Eyes - pupils equal and reactive, extraocular eye movements intact, no scleral icterus Mouth - mucous membranes moist, pharynx normal without lesions Chest - clear to auscultation, no wheezes, rales or rhonchi, symmetric air entry Heart - normal rate, regular rhythm, normal S1, S2, no murmurs, rubs, clicks or gallops Abdomen - soft, nontender, nondistended, no masses or organomegaly Musculoskeletal - no joint tenderness, deformity or swelling Extremities - peripheral pulses normal, no pedal edema, no clubbing or cyanosis Skin - normal coloration and turgor, no rashes, brisk cap refill  ED Course  Procedures (including critical care time)  Date: 06/28/2011  Rate: 78  Rhythm: sinus rhythm with first degree AV block  QRS Axis: normal  Intervals: normal  ST/T Wave abnormalities: nonspecific T wave changes  Conduction Disutrbances:first-degree A-V block   Narrative Interpretation: poor r wave progression  Old EKG Reviewed: none available  9:57 PM disucssed with [facility]- pt to be admitted to [facility], Team 2, telemetry bed.   Labs Reviewed  BASIC METABOLIC PANEL - Abnormal; Notable for the following:    Sodium 133 (*)    CO2 33 (*)    Glucose, Bld 112 (*)    GFR calc non Af Amer 61 (*)    GFR calc Af Amer 70 (*)    All other components within normal limits  URINALYSIS, ROUTINE W REFLEX MICROSCOPIC - Abnormal; Notable for the following:    Leukocytes, UA SMALL (*)    All other components  within normal limits  CBC - Abnormal; Notable for the following:    RBC 3.56 (*)    Hemoglobin 11.8 (*)    HCT 33.7 (*)    All other components within normal limits  LIPID PANEL - Abnormal; Notable for the following:    Triglycerides 177 (*)    HDL 38 (*)    All other components within normal limits  CBC - Abnormal; Notable for the following:    RBC 3.45 (*)    Hemoglobin 11.5 (*)    HCT 32.8 (*)    All other components within normal limits  COMPREHENSIVE METABOLIC PANEL - Abnormal; Notable for the following:    Glucose, Bld 116 (*)    Albumin 3.3 (*)    GFR calc non Af Amer 72 (*)    GFR calc Af Amer 84 (*)    All other components within normal limits  PRO B NATRIURETIC PEPTIDE - Abnormal; Notable for the following:    Pro B Natriuretic peptide (BNP) 880.2 (*)    All other components within normal limits  CARDIAC PANEL(CRET KIN+CKTOT+MB+TROPI) - Abnormal; Notable for the following:    CK, MB 4.5 (*)    All other components within normal limits  POCT I-STAT TROPONIN I  URINE MICROSCOPIC-ADD ON  MAGNESIUM  PHOSPHORUS  DIFFERENTIAL  APTT  PROTIME-INR  POCT CBG (FASTING - GLUCOSE)-MANUAL ENTRY  CBC  VITAMIN B12  FOLATE RBC  TSH  CARDIAC PANEL(CRET KIN+CKTOT+MB+TROPI)  HEMOGLOBIN A1C  COMPREHENSIVE METABOLIC PANEL  CBC  CARDIAC PANEL(CRET KIN+CKTOT+MB+TROPI)   Dg Chest 2 View  06/28/2011  *RADIOLOGY REPORT*  Clinical Data: Loss of consciousness  CHEST - 2 VIEW  Comparison: 02/05/2009  Findings: Heart size appears within normal limits.  There is chronic blunting of the left costophrenic angle which appears similar to previous exam.  Coarsened interstitial markings are identified bilaterally.  No focal airspace consolidation.  IMPRESSION:  1.  No acute cardiopulmonary abnormalities. 2.  COPD.  Original Report Authenticated By: Rosealee Albee, M.D.   Ct Head Wo Contrast  06/28/2011  *RADIOLOGY REPORT*  Clinical Data: Syncope.  CT HEAD WITHOUT CONTRAST  Technique:   Contiguous axial images were obtained from the base of the skull through the vertex without contrast.  Comparison: MRI of the brain performed 06/11/2003  Findings: There is no evidence of acute infarction, mass lesion, or intra- or extra-axial hemorrhage on CT.  Prominence of the ventricles and sulci reflects moderate cortical volume loss.  Mild cerebellar atrophy is noted.  Scattered periventricular white matter change likely reflects small vessel ischemic microangiopathy.  The brainstem and fourth ventricle are within normal limits.  The basal ganglia are unremarkable in appearance.  The cerebral hemispheres demonstrate grossly normal gray-white differentiation. No mass effect or midline shift is seen.  There is no evidence of fracture; visualized osseous structures are unremarkable in appearance.  The visualized portions of the orbits are  within normal limits.  The paranasal sinuses and mastoid air cells are well-aerated.  No significant soft tissue abnormalities are seen.  IMPRESSION:  1.  No acute intracranial pathology seen on CT. 2.  Moderate cortical volume loss noted. 3.  Scattered small vessel ischemic microangiopathy.  Original Report Authenticated By: Tonia Ghent, M.D.     1. Syncope       MDM  Patient presents after syncopal episode today. Workup in the emergency department was reassuring. By history she was initially hypotensive for EMS. Her symptoms may be due to some mild dehydration as she has been on Lasix for the past several weeks and has by history had multiple episodes that sound consistent with orthostatic hypotension. Patient was admitted to try hospitalist service for further evaluation and management.        Ethelda Chick, MD 06/29/11 [phone]

## 2011-06-28 NOTE — H&P (Addendum)
PCP:  Kerby Nora, MD, MD   DOA:  06/28/2011  5:07 PM  Chief Complaint:  syncope  HPI: 78 year old female with history of HTN, depression who presented to ED after syncopal episode at home. As per family member and patient, patient has lost consciousness for less than 5 minutes and did not appear to be confused after she regained consciousness. Prior to syncope prodromal symptoms reported by patient were lightheadedness but no chest pain and no shortness of breath or palpitations. Syncopal episode occurred while sitting. Patient also reports that she normally takes lasix 40 mg daily given by cardiologist but was told to take an extra dose is she felt short of breath. Since she felt short of breath last night, she took extra dose of lasix but no symptomatic relief of shortness of breath. There was no complaints of nausea or vomiting, no abdominal pain, no diarrhea or constipation.  Assessment/Plan  Principal Problem:   *Syncope - perhaps secondary to dehydration or perhaps secondary to lasix intake (extra dose) versus ? UTI - rule out cardiac arrhythmia: cycle cardiac enzymes and obtain 12 lead EKG - we will start ciprofloxacin IV - CXR is negative, shows COPD - obtain CT head - follow up TSH, A1c, B 12 and folate levels - PT evaluation - admit to telemetry - gentle IV fluid hydration given in ED - normal BP - start home dose lasix 40 mg daily  Active Problems:  Left lower extremity swelling - rule out DVT - give prophylactic dose of lovenox  COPD - nebulizer treatments as needed for shortness of breath - keep O2 saturation above 90%  CHF, diastolic, chronic - obtain BNP - unable to find previous record of 2 D ECHO to confirm the history of CHF but as per patient she has been diagnosed recently and for that reason put on lasix   HYPERLIPIDEMIA - continue pravachol - follow up lipid panel   DEPRESSION - continue lexapro   HYPERTENSION - BP 141/58 - continue imdur,  avapro   CAD - continue plavix provided no  Bleed on CT head  DVT Prophylaxis - lovenox sub Q  Code Status - full code  Education  - test results and diagnostic studies were discussed with patient and pt's family who was present at the bedside - patient and family have verbalized the understanding - questions were answered at the bedside and contact information was provided for additional questions or concerns  Disposition - admit to telemetry - spoke with Deboraha Sprang MD on call who covers for Dr. Jerelyn Scott and have informed them of patient's admission; they will see the patient in am    Allergies: Allergies  Allergen Reactions  . Penicillins   . Sulfonamide Derivatives     REACTION: Rash and swelling    Prior to Admission medications   Medication Sig Start Date End Date Taking? Authorizing Provider  calcium-vitamin D (OSCAL WITH D) 500-200 MG-UNIT per tablet Take 1 tablet by mouth daily.   Yes Historical Provider, MD  clopidogrel (PLAVIX) 75 MG tablet Take 75 mg by mouth daily.   Yes Historical Provider, MD  Cranberry 250 MG CAPS Take 1 capsule by mouth daily.   Yes Historical Provider, MD  cyanocobalamin 500 MCG tablet Take 500 mcg by mouth daily.   Yes Historical Provider, MD  escitalopram (LEXAPRO) 10 MG tablet Take 5 mg by mouth daily.   Yes Historical Provider, MD  ferrous sulfate 325 (65 FE) MG tablet Take 325 mg by mouth daily  with breakfast.   Yes Historical Provider, MD  furosemide (LASIX) 40 MG tablet Take 40 mg by mouth daily. Take extra tablet if needed   Yes Historical Provider, MD  isosorbide mononitrate (IMDUR) 30 MG 24 hr tablet Take 30 mg by mouth daily.   Yes Historical Provider, MD  isosorbide mononitrate (ISMO,MONOKET) 10 MG tablet Take by mouth 2 (two) times daily.   Yes Historical Provider, MD  Multiple Vitamin (MULITIVITAMIN WITH MINERALS) TABS Take 1 tablet by mouth daily.   Yes Historical Provider, MD  Multiple Vitamins-Minerals (ICAPS) CAPS Take 1 capsule by  mouth 2 (two) times daily.   Yes Historical Provider, MD  pantoprazole (PROTONIX) 40 MG tablet Take 40 mg by mouth daily.   Yes Historical Provider, MD  polyethylene glycol (MIRALAX / GLYCOLAX) packet Take 17 g by mouth daily.   Yes Historical Provider, MD  pravastatin (PRAVACHOL) 40 MG tablet Take 40 mg by mouth daily.   Yes Historical Provider, MD  valsartan (DIOVAN) 160 MG tablet Take 160 mg by mouth daily.   Yes Historical Provider, MD  vitamin C (ASCORBIC ACID) 500 MG tablet Take 500 mg by mouth daily.   Yes Historical Provider, MD  vitamin E 400 UNIT capsule Take 400 Units by mouth daily.   Yes Historical Provider, MD    Past Medical History  Diagnosis Date  . CHF (congestive heart failure)   . GERD (gastroesophageal reflux disease)   . Hypertension   . Diverticulitis   . UTI (urinary tract infection)   . Blockage of coronary artery of heart     Past Surgical History  Procedure Date  . Cholecystectomy   . Appendectomy     Social History:  reports that she has never smoked. She does not have any smokeless tobacco history on file. She reports that she does not drink alcohol or use illicit drugs.  History reviewed. No pertinent family history.  Review of Systems:  Constitutional: Denies fever, chills, diaphoresis, appetite change and fatigue.  HEENT: Denies photophobia, eye pain, redness, hearing loss, ear pain, congestion, sore throat, rhinorrhea, sneezing, mouth sores, trouble swallowing, neck pain, neck stiffness and tinnitus.   Respiratory: Denies SOB, DOE, cough, chest tightness,  and wheezing.   Cardiovascular: Denies chest pain, palpitations and leg swelling.  Gastrointestinal: Denies nausea, vomiting, abdominal pain, diarrhea, constipation, blood in stool and abdominal distention.  Genitourinary: Denies dysuria, urgency, frequency, hematuria, flank pain and difficulty urinating.  Musculoskeletal: Denies myalgias, back pain, joint swelling, arthralgias and gait problem.   Skin: Denies pallor, rash and wound.  Neurological: as per HPI Hematological: Denies adenopathy. Easy bruising, personal or family bleeding history  Psychiatric/Behavioral: Denies suicidal ideation, mood changes, confusion, nervousness, sleep disturbance and agitation   Physical Exam:  Filed Vitals:   06/28/11 2045 06/28/11 2100 06/28/11 2115 06/28/11 2130  BP: 155/65 145/56 167/64 141/58  Pulse: 72 69 74 67  Temp:      TempSrc:      Resp: 26 27 27 24   SpO2: 99% 98% 100% 98%    Constitutional: Vital signs reviewed.  Patient is in no acute distress and cooperative with exam. Alert and oriented x3.  Head: Normocephalic and atraumatic Ear: TM normal bilaterally Mouth: no erythema or exudates, dry oral mucosa Eyes: PERRL, EOMI, conjunctivae normal, No scleral icterus.  Neck: Supple, Trachea midline normal ROM, No JVD, mass, thyromegaly, or carotid bruit present.  Cardiovascular: RRR, S1 normal, S2 normal, no MRG, pulses symmetric and intact bilaterally Pulmonary/Chest: CTAB, no wheezes, rales, or  rhonchi Abdominal: Soft. Non-tender, non-distended, bowel sounds are normal, no masses, organomegaly, or guarding present.  GU: no CVA tenderness Musculoskeletal: No joint deformities, erythema, or stiffness, ROM full and no nontender Ext: left lower extremity swollen (+2) pitting edema, right lower extremity normal; ecchymotic areas over bilateral lower extremities  Hematology: no cervical, inginal, or axillary adenopathy.  Neurological: A&O x3, Strenght is normal and symmetric bilaterally, cranial nerve II-XII are grossly intact, no focal motor deficit, sensory intact to light touch bilaterally.  Skin: Warm, dry and intact. No rash, cyanosis, or clubbing.  Psychiatric: Normal mood and affect. speech and behavior is normal. Judgment and thought content normal. Cognition and memory are normal.   Labs on Admission:  Results for orders placed during the hospital encounter of 06/28/11 (from  the past 48 hour(s))  BASIC METABOLIC PANEL     Status: Abnormal   Collection Time   06/28/11  5:50 PM      Component Value Range Comment   Sodium 133 (*) 135 - 145 (mEq/L)    Potassium 4.3  3.5 - 5.1 (mEq/L)    Chloride 96  96 - 112 (mEq/L)    CO2 33 (*) 19 - 32 (mEq/L)    Glucose, Bld 112 (*) 70 - 99 (mg/dL)    BUN 13  6 - 23 (mg/dL)    Creatinine, Ser 1.61  0.50 - 1.10 (mg/dL)    Calcium 9.3  8.4 - 10.5 (mg/dL)    GFR calc non Af Amer 61 (*) >90 (mL/min)    GFR calc Af Amer 70 (*) >90 (mL/min)   POCT I-STAT TROPONIN I     Status: Normal   Collection Time   06/28/11  6:03 PM      Component Value Range Comment   Troponin i, poc 0.00  0.00 - 0.08 (ng/mL)    Comment 3            CBC     Status: Abnormal   Collection Time   06/28/11  6:30 PM      Component Value Range Comment   WBC 9.3  4.0 - 10.5 (K/uL)    RBC 3.56 (*) 3.87 - 5.11 (MIL/uL)    Hemoglobin 11.8 (*) 12.0 - 15.0 (g/dL)    HCT 09.6 (*) 04.5 - 46.0 (%)    MCV 94.7  78.0 - 100.0 (fL)    MCH 33.1  26.0 - 34.0 (pg)    MCHC 35.0  30.0 - 36.0 (g/dL)    RDW 40.9  81.1 - 91.4 (%)    Platelets 188  150 - 400 (K/uL)   URINALYSIS, ROUTINE W REFLEX MICROSCOPIC     Status: Abnormal   Collection Time   06/28/11  7:49 PM      Component Value Range Comment   Color, Urine YELLOW  YELLOW     APPearance CLEAR  CLEAR     Specific Gravity, Urine 1.013  1.005 - 1.030     pH 6.5  5.0 - 8.0     Glucose, UA NEGATIVE  NEGATIVE (mg/dL)    Hgb urine dipstick NEGATIVE  NEGATIVE     Bilirubin Urine NEGATIVE  NEGATIVE     Ketones, ur NEGATIVE  NEGATIVE (mg/dL)    Protein, ur NEGATIVE  NEGATIVE (mg/dL)    Urobilinogen, UA 0.2  0.0 - 1.0 (mg/dL)    Nitrite NEGATIVE  NEGATIVE     Leukocytes, UA SMALL (*) NEGATIVE    URINE MICROSCOPIC-ADD ON     Status: Normal  Collection Time   06/28/11  7:49 PM      Component Value Range Comment   Squamous Epithelial / LPF RARE  RARE     WBC, UA 7-10  <3 (WBC/hpf)     Time Spent on Admission: Over 30  minutes  Carl Butner 06/28/2011, 10:04 PM  [facility] Pager # [phone] Main Office # [phone]

## 2011-06-29 DIAGNOSIS — M79609 Pain in unspecified limb: Secondary | ICD-10-CM

## 2011-06-29 LAB — LIPID PANEL
HDL: 38 mg/dL — ABNORMAL LOW (ref >39)
LDL Cholesterol: 70 mg/dL (ref 0–99)
Total CHOL/HDL Ratio: 3.8 RATIO
Triglycerides: 177 mg/dL — ABNORMAL HIGH (ref <150)
VLDL: 35 mg/dL (ref 0–40)

## 2011-06-29 LAB — COMPREHENSIVE METABOLIC PANEL
BUN: 10 mg/dL (ref 6–23)
CO2: 30 mEq/L (ref 19–32)
Chloride: 102 mEq/L (ref 96–112)
Creatinine, Ser: 0.74 mg/dL (ref 0.50–1.10)
GFR calc Af Amer: 84 mL/min — ABNORMAL LOW (ref >90)
GFR calc non Af Amer: 72 mL/min — ABNORMAL LOW (ref >90)
Total Bilirubin: 0.5 mg/dL (ref 0.3–1.2)

## 2011-06-29 LAB — GLUCOSE, CAPILLARY: Glucose-Capillary: 99 mg/dL (ref 70–99)

## 2011-06-29 LAB — CBC
Platelets: 181 10*3/uL (ref 150–400)
RBC: 3.56 MIL/uL — ABNORMAL LOW (ref 3.87–5.11)
WBC: 6.8 10*3/uL (ref 4.0–10.5)

## 2011-06-29 LAB — CARDIAC PANEL(CRET KIN+CKTOT+MB+TROPI)
CK, MB: 4.2 ng/mL — ABNORMAL HIGH (ref 0.3–4.0)
Total CK: 76 U/L (ref 7–177)

## 2011-06-29 LAB — VITAMIN B12: Vitamin B-12: 897 pg/mL (ref 211–911)

## 2011-06-29 MED ORDER — POTASSIUM CHLORIDE CRYS ER 20 MEQ PO TBCR
20.0000 meq | EXTENDED_RELEASE_TABLET | Freq: Once | ORAL | Status: AC
  Administered 2011-06-29: 20 meq via ORAL
  Filled 2011-06-29 (×2): qty 1

## 2011-06-29 MED ORDER — CIPROFLOXACIN HCL 250 MG PO TABS: 250.0000 mg | ORAL_TABLET | Freq: Two times a day (BID) | ORAL | Status: AC

## 2011-06-29 NOTE — Progress Notes (Signed)
Physical Therapy Note  PT evaluation ordered then discontinued.  Please re-order if appropriate. Thanks.  Zenovia Jarred, PT Pager: [phone]

## 2011-06-29 NOTE — Progress Notes (Signed)
Left lower extremity venous duplex completed.  Preliminary report is negative for DVT, SVT, or a Baker's cyst on the left.  Negative for DVT in the right common femoral vein.

## 2011-06-29 NOTE — Discharge Summary (Signed)
Physician Discharge Summary  Patient ID: Laura Zuniga MRN: [id number] DOB/AGE: Apr 29, 1917 78 y.o.  Admit date: 06/28/2011 Discharge date: 06/29/2011  Admission Diagnoses:  Discharge Diagnoses:  Principal Problem:  *Syncope Active Problems:  HYPERLIPIDEMIA  DEPRESSION  HYPERTENSION  CAD  Swelling of left lower extremity  COPD (chronic obstructive pulmonary disease)  Diastolic CHF, chronic   Disposition: 06-Home-Health Care Svc   Consults: None  Significant Diagnostic Studies:    labs: as below and radiology: CXR: normal and CT scan:CT scan of the headnegative. Radiology   Results for orders placed during the hospital encounter of 06/28/11 (from the past 72 hour(s))  BASIC METABOLIC PANEL     Status: Abnormal   Collection Time   06/28/11  5:50 PM      Component Value Range Comment   Sodium 133 (*) 135 - 145 (mEq/L)    Potassium 4.3  3.5 - 5.1 (mEq/L)    Chloride 96  96 - 112 (mEq/L)    CO2 33 (*) 19 - 32 (mEq/L)    Glucose, Bld 112 (*) 70 - 99 (mg/dL)    BUN 13  6 - 23 (mg/dL)    Creatinine, Ser 4.09  0.50 - 1.10 (mg/dL)    Calcium 9.3  8.4 - 10.5 (mg/dL)    GFR calc non Af Amer 61 (*) >90 (mL/min)    GFR calc Af Amer 70 (*) >90 (mL/min)   POCT I-STAT TROPONIN I     Status: Normal   Collection Time   06/28/11  6:03 PM      Component Value Range Comment   Troponin i, poc 0.00  0.00 - 0.08 (ng/mL)    Comment 3            CBC     Status: Abnormal   Collection Time   06/28/11  6:30 PM      Component Value Range Comment   WBC 9.3  4.0 - 10.5 (K/uL)    RBC 3.56 (*) 3.87 - 5.11 (MIL/uL)    Hemoglobin 11.8 (*) 12.0 - 15.0 (g/dL)    HCT 81.1 (*) 91.4 - 46.0 (%)    MCV 94.7  78.0 - 100.0 (fL)    MCH 33.1  26.0 - 34.0 (pg)    MCHC 35.0  30.0 - 36.0 (g/dL)    RDW 78.2  95.6 - 21.3 (%)    Platelets 188  150 - 400 (K/uL)   URINALYSIS, ROUTINE W REFLEX MICROSCOPIC     Status: Abnormal   Collection Time   06/28/11  7:49 PM      Component Value Range Comment   Color,  Urine YELLOW  YELLOW     APPearance CLEAR  CLEAR     Specific Gravity, Urine 1.013  1.005 - 1.030     pH 6.5  5.0 - 8.0     Glucose, UA NEGATIVE  NEGATIVE (mg/dL)    Hgb urine dipstick NEGATIVE  NEGATIVE     Bilirubin Urine NEGATIVE  NEGATIVE     Ketones, ur NEGATIVE  NEGATIVE (mg/dL)    Protein, ur NEGATIVE  NEGATIVE (mg/dL)    Urobilinogen, UA 0.2  0.0 - 1.0 (mg/dL)    Nitrite NEGATIVE  NEGATIVE     Leukocytes, UA SMALL (*) NEGATIVE    URINE MICROSCOPIC-ADD ON     Status: Normal   Collection Time   06/28/11  7:49 PM      Component Value Range Comment   Squamous Epithelial / LPF RARE  RARE  WBC, UA 7-10  <3 (WBC/hpf)   VITAMIN B12     Status: Normal   Collection Time   06/28/11 10:30 PM      Component Value Range Comment   Vitamin B-12 897  211 - 911 (pg/mL)   LIPID PANEL     Status: Abnormal   Collection Time   06/28/11 10:30 PM      Component Value Range Comment   Cholesterol 143  0 - 200 (mg/dL)    Triglycerides 161 (*) <150 (mg/dL)    HDL 38 (*) >09 (mg/dL)    Total CHOL/HDL Ratio 3.8      VLDL 35  0 - 40 (mg/dL)    LDL Cholesterol 70  0 - 99 (mg/dL)   CBC     Status: Abnormal   Collection Time   06/28/11 10:30 PM      Component Value Range Comment   WBC 8.7  4.0 - 10.5 (K/uL)    RBC 3.45 (*) 3.87 - 5.11 (MIL/uL)    Hemoglobin 11.5 (*) 12.0 - 15.0 (g/dL)    HCT 60.4 (*) 54.0 - 46.0 (%)    MCV 95.1  78.0 - 100.0 (fL)    MCH 33.3  26.0 - 34.0 (pg)    MCHC 35.1  30.0 - 36.0 (g/dL)    RDW 98.1  19.1 - 47.8 (%)    Platelets 176  150 - 400 (K/uL)   COMPREHENSIVE METABOLIC PANEL     Status: Abnormal   Collection Time   06/28/11 10:30 PM      Component Value Range Comment   Sodium 136  135 - 145 (mEq/L)    Potassium 4.1  3.5 - 5.1 (mEq/L)    Chloride 101  96 - 112 (mEq/L)    CO2 31  19 - 32 (mEq/L)    Glucose, Bld 116 (*) 70 - 99 (mg/dL)    BUN 12  6 - 23 (mg/dL)    Creatinine, Ser 2.95  0.50 - 1.10 (mg/dL)    Calcium 8.8  8.4 - 10.5 (mg/dL)    Total Protein 6.4   6.0 - 8.3 (g/dL)    Albumin 3.3 (*) 3.5 - 5.2 (g/dL)    AST 23  0 - 37 (U/L)    ALT 15  0 - 35 (U/L)    Alkaline Phosphatase 73  39 - 117 (U/L)    Total Bilirubin 0.3  0.3 - 1.2 (mg/dL)    GFR calc non Af Amer 72 (*) >90 (mL/min)    GFR calc Af Amer 84 (*) >90 (mL/min)   MAGNESIUM     Status: Normal   Collection Time   06/28/11 10:30 PM      Component Value Range Comment   Magnesium 1.7  1.5 - 2.5 (mg/dL)   PHOSPHORUS     Status: Normal   Collection Time   06/28/11 10:30 PM      Component Value Range Comment   Phosphorus 3.0  2.3 - 4.6 (mg/dL)   DIFFERENTIAL     Status: Normal   Collection Time   06/28/11 10:30 PM      Component Value Range Comment   Neutrophils Relative 72  43 - 77 (%)    Neutro Abs 6.2  1.7 - 7.7 (K/uL)    Lymphocytes Relative 21  12 - 46 (%)    Lymphs Abs 1.8  0.7 - 4.0 (K/uL)    Monocytes Relative 7  3 - 12 (%)    Monocytes Absolute 0.6  0.1 - 1.0 (K/uL)    Eosinophils Relative 1  0 - 5 (%)    Eosinophils Absolute 0.0  0.0 - 0.7 (K/uL)    Basophils Relative 0  0 - 1 (%)    Basophils Absolute 0.0  0.0 - 0.1 (K/uL)   APTT     Status: Normal   Collection Time   06/28/11 10:30 PM      Component Value Range Comment   aPTT 31  24 - 37 (seconds)   PROTIME-INR     Status: Normal   Collection Time   06/28/11 10:30 PM      Component Value Range Comment   Prothrombin Time 14.0  11.6 - 15.2 (seconds)    INR 1.06  0.00 - 1.49    TSH     Status: Normal   Collection Time   06/28/11 10:30 PM      Component Value Range Comment   TSH 3.636  0.350 - 4.500 (uIU/mL)   PRO B NATRIURETIC PEPTIDE     Status: Abnormal   Collection Time   06/28/11 10:30 PM      Component Value Range Comment   Pro B Natriuretic peptide (BNP) 880.2 (*) 0 - 450 (pg/mL)   CARDIAC PANEL(CRET KIN+CKTOT+MB+TROPI)     Status: Abnormal   Collection Time   06/28/11 10:30 PM      Component Value Range Comment   Total CK 81  7 - 177 (U/L)    CK, MB 4.5 (*) 0.3 - 4.0 (ng/mL)    Troponin I <0.30   <0.30 (ng/mL)    Relative Index RELATIVE INDEX IS INVALID  0.0 - 2.5    HEMOGLOBIN A1C     Status: Normal   Collection Time   06/28/11 10:30 PM      Component Value Range Comment   Hemoglobin A1C 5.1  <5.7 (%)    Mean Plasma Glucose 100  <117 (mg/dL)   CARDIAC PANEL(CRET KIN+CKTOT+MB+TROPI)     Status: Abnormal   Collection Time   06/29/11  5:25 AM      Component Value Range Comment   Total CK 76  7 - 177 (U/L)    CK, MB 4.2 (*) 0.3 - 4.0 (ng/mL)    Troponin I <0.30  <0.30 (ng/mL)    Relative Index RELATIVE INDEX IS INVALID  0.0 - 2.5    COMPREHENSIVE METABOLIC PANEL     Status: Abnormal   Collection Time   06/29/11  5:25 AM      Component Value Range Comment   Sodium 137  135 - 145 (mEq/L)    Potassium 3.3 (*) 3.5 - 5.1 (mEq/L)    Chloride 102  96 - 112 (mEq/L)    CO2 30  19 - 32 (mEq/L)    Glucose, Bld 87  70 - 99 (mg/dL)    BUN 10  6 - 23 (mg/dL)    Creatinine, Ser 6.57  0.50 - 1.10 (mg/dL)    Calcium 8.7  8.4 - 10.5 (mg/dL)    Total Protein 6.2  6.0 - 8.3 (g/dL)    Albumin 3.1 (*) 3.5 - 5.2 (g/dL)    AST 22  0 - 37 (U/L)    ALT 15  0 - 35 (U/L)    Alkaline Phosphatase 65  39 - 117 (U/L)    Total Bilirubin 0.5  0.3 - 1.2 (mg/dL)    GFR calc non Af Amer 72 (*) >90 (mL/min)    GFR calc Af Amer 84 (*) >90 (  mL/min)   CBC     Status: Abnormal   Collection Time   06/29/11  5:25 AM      Component Value Range Comment   WBC 6.8  4.0 - 10.5 (K/uL)    RBC 3.56 (*) 3.87 - 5.11 (MIL/uL)    Hemoglobin 11.7 (*) 12.0 - 15.0 (g/dL)    HCT 40.9 (*) 81.1 - 46.0 (%)    MCV 94.7  78.0 - 100.0 (fL)    MCH 32.9  26.0 - 34.0 (pg)    MCHC 34.7  30.0 - 36.0 (g/dL)    RDW 91.4  78.2 - 95.6 (%)    Platelets 181  150 - 400 (K/uL)   GLUCOSE, CAPILLARY     Status: Normal   Collection Time   06/29/11  7:25 AM      Component Value Range Comment   Glucose-Capillary 99  70 - 99 (mg/dL)      Treatments: IV hydration and antibiotics: Cipro  Hospital Course: 78 years old female with underlying  history of diastolic heart failure, COPD, hypertension, depression, dementia,admitted with brief syncopal episode at home. According to the daughter patient was sitting on the chair and she felt passed out worried briefly, she had no prodromal symptoms of seizure or no confusion, no chest pain or shortness of breath. She does have has some dyspnea on exertion according to the daughter has been some days worse than the others. She had taken extra dose of Lasix the night before because she was feeling low short-winded; patient had no swelling in the legs, no chest pain . Hospital course patient was admitted, her initial evaluation including CT of the head negative chest x-ray negative underlying COPD, no acute finding, blood work including blood chemistries blood counts cardiac markers were all unremarkable. BNP was mildly elevated at age 72, without any clinical symptoms of CHF. Potassium was 3.3, urinalysis showed few white cells, previous history of UTIs in the past. She was put on telemetry, no arrhythmias overnight, she ruled out. Mild UTI, Cipro to 250 mg b.i.d. For 3 days. Follow up in the office in one week.  Discharged Condition: {condition:18240   Discharge Orders    Future Appointments: Provider: Department: Dept Phone: Center:   05/26/2012 9:30 AM Sherrie George, MD [facility] [phone] None     Future Orders Please Complete By Expires   Diet - low sodium heart healthy      Increase activity slowly        Medication List  As of 06/29/2011  8:38 AM   TAKE these medications         calcium-vitamin D 500-200 MG-UNIT per tablet   Commonly known as: OSCAL WITH D   Take 1 tablet by mouth daily.      ciprofloxacin 250 MG tablet   Commonly known as: CIPRO   Take 1 tablet (250 mg total) by mouth 2 (two) times daily.      clopidogrel 75 MG tablet   Commonly known as: PLAVIX   Take 75 mg by mouth daily.      Cranberry 250 MG Caps   Take 1 capsule by mouth daily.       cyanocobalamin 500 MCG tablet   Take 500 mcg by mouth daily.      escitalopram 10 MG tablet   Commonly known as: LEXAPRO   Take 5 mg by mouth daily.      ferrous sulfate 325 (65 FE) MG tablet   Take 325 mg  by mouth daily with breakfast.      furosemide 40 MG tablet   Commonly known as: LASIX   Take 40 mg by mouth daily. Take extra tablet if needed      ICAPS Caps   Take 1 capsule by mouth 2 (two) times daily.      isosorbide mononitrate 10 MG tablet   Commonly known as: ISMO,MONOKET   Take by mouth 2 (two) times daily.      isosorbide mononitrate 30 MG 24 hr tablet   Commonly known as: IMDUR   Take 30 mg by mouth daily.      mulitivitamin with minerals Tabs   Take 1 tablet by mouth daily.      pantoprazole 40 MG tablet   Commonly known as: PROTONIX   Take 40 mg by mouth daily.      polyethylene glycol packet   Commonly known as: MIRALAX / GLYCOLAX   Take 17 g by mouth daily.      pravastatin 40 MG tablet   Commonly known as: PRAVACHOL   Take 40 mg by mouth daily.      valsartan 160 MG tablet   Commonly known as: DIOVAN   Take 160 mg by mouth daily.      vitamin C 500 MG tablet   Commonly known as: ASCORBIC ACID   Take 500 mg by mouth daily.      vitamin E 400 UNIT capsule   Take 400 Units by mouth daily.           Follow-up Information    Follow up with Georgann Housekeeper, MD in 10 days.   Contact information:   [address] [place] [address] [phone]          Signed: Georgann Housekeeper 06/29/2011, 8:38 AM

## 2012-01-26 ENCOUNTER — Encounter (HOSPITAL_COMMUNITY): Payer: Self-pay | Admitting: Emergency Medicine

## 2012-01-26 ENCOUNTER — Emergency Department (HOSPITAL_COMMUNITY)
Admission: EM | Admit: 2012-01-26 | Discharge: 2012-01-26 | Disposition: A | Discharge status: Home / Self Care | Payer: Medicare Other | Source: Home / Self Care | Attending: Emergency Medicine | Admitting: Emergency Medicine

## 2012-01-26 DIAGNOSIS — IMO0002 Reserved for concepts with insufficient information to code with codable children: Secondary | ICD-10-CM

## 2012-01-26 DIAGNOSIS — T148XXA Other injury of unspecified body region, initial encounter: Secondary | ICD-10-CM

## 2012-01-26 DIAGNOSIS — Z23 Encounter for immunization: Secondary | ICD-10-CM

## 2012-01-26 MED ORDER — TETANUS-DIPHTH-ACELL PERTUSSIS 5-2.5-18.5 LF-MCG/0.5 IM SUSP
0.5000 mL | Freq: Once | INTRAMUSCULAR | Status: AC
  Administered 2012-01-26: 0.5 mL via INTRAMUSCULAR

## 2012-01-26 MED ORDER — TETANUS-DIPHTH-ACELL PERTUSSIS 5-2.5-18.5 LF-MCG/0.5 IM SUSP
INTRAMUSCULAR | Status: AC
  Filled 2012-01-26: qty 0.5

## 2012-01-26 NOTE — ED Notes (Signed)
Dog scratched left leg causing a laceration.   Pt denies any pain or other symptoms.   Does not remember last tdap.

## 2012-01-26 NOTE — ED Provider Notes (Signed)
Chief Complaint  Patient presents with  . Extremity Laceration    History of Present Illness:  Laura Zuniga is a 78 year old female who was scratched by her dog on the left lower leg this afternoon, sustaining a small L-shaped, flap laceration. Bleeding was easily stopped. There's been no swelling or erythema. No drainage. She denies any numbness or tingling in the extremity. She cannot recall when her last tetanus shot was.  Review of Systems:  Other than noted above, the patient denies any of the following symptoms: Systemic:  No fever or chills. Musculoskeletal:  No joint pain or decreased range of motion. Neuro:  No numbness, tingling, or weakness.  PMFSH:  Past medical history, family history, social history, meds, and allergies were reviewed.  Physical Exam:   Vital signs:  BP 180/71  Pulse 74  Temp 97.3 F (36.3 C) (Oral)  Resp 16  SpO2 98% Ext:  There was a 2 x 1 cm L-shaped, flap, skin tear on the left pretibial surface. Bleeding was stopped. No surrounding erythema or drainage.  All joints had a full ROM without pain.  Pulses were full.  Good capillary refill in all digits.  No edema. Neurological:  Alert and oriented.  No muscle weakness.  Sensation was intact to light touch.   Procedure: Verbal informed consent was obtained.  The patient was informed of the risks and benefits of the procedure and understands and accepts.  Identity of the patient was verified verbally and by wristband.   The laceration area described above was prepped with saline, tincture of benzoin was applied, and the skin tear was closed with 6 0.25 x 8" Steri-Strips. The patient was instructed to leave these on until they fell off on her own and to be on the lookout for any sign of infection.  Medications given in [facility]:  She was given a Tdap vaccine. She tolerated this well without any immediate side effects.  Assessment:  The encounter diagnosis was Skin tear.  Plan:   1.  The following meds were  prescribed:   New Prescriptions   No medications on file   2.  The patient was instructed in wound care and pain control, and handouts were given. 3.  The patient was told to return if any sign of infection.     Reuben Likes, MD 01/26/12 [phone]

## 2012-05-26 ENCOUNTER — Ambulatory Visit (INDEPENDENT_AMBULATORY_CARE_PROVIDER_SITE_OTHER): Payer: Medicare Other | Admitting: Ophthalmology

## 2012-09-11 ENCOUNTER — Other Ambulatory Visit: Payer: Self-pay

## 2012-09-11 DIAGNOSIS — Z1231 Encounter for screening mammogram for malignant neoplasm of breast: Secondary | ICD-10-CM

## 2012-09-23 ENCOUNTER — Ambulatory Visit (INDEPENDENT_AMBULATORY_CARE_PROVIDER_SITE_OTHER): Payer: Self-pay | Admitting: Ophthalmology

## 2012-10-01 ENCOUNTER — Ambulatory Visit (INDEPENDENT_AMBULATORY_CARE_PROVIDER_SITE_OTHER): Payer: Self-pay | Admitting: Ophthalmology

## 2012-10-13 ENCOUNTER — Ambulatory Visit (INDEPENDENT_AMBULATORY_CARE_PROVIDER_SITE_OTHER): Payer: Medicare Other | Admitting: Ophthalmology

## 2012-10-13 DIAGNOSIS — H35039 Hypertensive retinopathy, unspecified eye: Secondary | ICD-10-CM

## 2012-10-13 DIAGNOSIS — H3530 Unspecified macular degeneration: Secondary | ICD-10-CM

## 2012-10-13 DIAGNOSIS — H43819 Vitreous degeneration, unspecified eye: Secondary | ICD-10-CM

## 2012-10-13 DIAGNOSIS — I10 Essential (primary) hypertension: Secondary | ICD-10-CM

## 2012-10-16 ENCOUNTER — Ambulatory Visit
Admission: RE | Admit: 2012-10-16 | Discharge: 2012-10-16 | Disposition: A | Discharge status: Home / Self Care | Payer: Medicare Other | Source: Ambulatory Visit

## 2012-10-16 DIAGNOSIS — Z1231 Encounter for screening mammogram for malignant neoplasm of breast: Secondary | ICD-10-CM

## 2013-02-16 ENCOUNTER — Encounter: Payer: Self-pay | Admitting: Interventional Cardiology

## 2013-02-24 ENCOUNTER — Telehealth: Payer: Self-pay

## 2013-02-24 MED ORDER — FUROSEMIDE 40 MG PO TABS: ORAL_TABLET | ORAL | Status: DC

## 2013-02-24 NOTE — Telephone Encounter (Signed)
Refilled

## 2013-04-09 DIAGNOSIS — A0472 Enterocolitis due to Clostridium difficile, not specified as recurrent: Secondary | ICD-10-CM

## 2013-04-09 HISTORY — DX: Enterocolitis due to Clostridium difficile, not specified as recurrent: A04.72

## 2013-05-02 ENCOUNTER — Encounter: Payer: Self-pay | Admitting: Interventional Cardiology

## 2013-05-06 ENCOUNTER — Ambulatory Visit: Payer: Medicare Other | Admitting: Interventional Cardiology

## 2013-05-18 ENCOUNTER — Other Ambulatory Visit: Payer: Self-pay | Admitting: Cardiology

## 2013-05-18 MED ORDER — CLOPIDOGREL BISULFATE 75 MG PO TABS: 75.0000 mg | ORAL_TABLET | Freq: Every day | ORAL | Status: DC

## 2013-05-18 MED ORDER — VALSARTAN 160 MG PO TABS: 160.0000 mg | ORAL_TABLET | Freq: Every day | ORAL | Status: DC

## 2013-05-18 NOTE — Telephone Encounter (Signed)
Refilled

## 2013-06-01 ENCOUNTER — Telehealth: Payer: Self-pay | Admitting: Interventional Cardiology

## 2013-06-01 NOTE — Telephone Encounter (Signed)
New problem    Pt daughter called [facility] to phar [facility] on [address].   Please call daughter if any questions.

## 2013-06-02 MED ORDER — ISOSORBIDE MONONITRATE ER 60 MG PO TB24: ORAL_TABLET | ORAL | Status: DC

## 2013-06-02 NOTE — Telephone Encounter (Signed)
Refilled

## 2013-06-11 ENCOUNTER — Encounter: Payer: Self-pay | Admitting: Interventional Cardiology

## 2013-06-11 ENCOUNTER — Ambulatory Visit (INDEPENDENT_AMBULATORY_CARE_PROVIDER_SITE_OTHER): Payer: Medicare HMO | Admitting: Interventional Cardiology

## 2013-06-11 VITALS — BP 128/44 | HR 64 | Ht 60.0 in | Wt 143.0 lb

## 2013-06-11 DIAGNOSIS — R9430 Abnormal result of cardiovascular function study, unspecified: Secondary | ICD-10-CM

## 2013-06-11 DIAGNOSIS — I10 Essential (primary) hypertension: Secondary | ICD-10-CM

## 2013-06-11 DIAGNOSIS — I5032 Chronic diastolic (congestive) heart failure: Secondary | ICD-10-CM

## 2013-06-11 DIAGNOSIS — I509 Heart failure, unspecified: Secondary | ICD-10-CM

## 2013-06-11 DIAGNOSIS — E785 Hyperlipidemia, unspecified: Secondary | ICD-10-CM

## 2013-06-11 MED ORDER — FUROSEMIDE 40 MG PO TABS: ORAL_TABLET | ORAL | Status: DC

## 2013-06-11 NOTE — Progress Notes (Signed)
Patient ID: Laura Zuniga, female   DOB: [birth year], 80 y.o.   MRN: [id number]    [place], [address], [place]  [address] Phone: [phone] Fax:  [phone]  Date:  06/11/2013   ID:  Laura Zuniga, DOB 1917-07-31, MRN [id number]  PCP:  Wenda Low, MD      History of Present Illness: Laura Zuniga is a 80 y.o. female  who has CAD. SHe has had worsening SHOB. she has had some improvement with Lasix. Many years ago, she had a markedly abnormal stress test. Her symptoms were controlled with medicines. Due to her age, we opted for more conservative therapy.  She denies any pain or pressure in her chest. She is tired. Her daughter reports that she sleeps much of the time. These issues have been going on for some time.   Vital Signs      Wt Readings from Last 3 Encounters:  06/11/13 143 lb (64.864 kg)  06/29/11 142 lb 3.2 oz (64.5 kg)  02/04/07 136 lb 8 oz (61.916 kg)     Past Medical History  Diagnosis Date  . CHF (congestive heart failure)   . GERD (gastroesophageal reflux disease)   . Hypertension   . Diverticulitis   . UTI (urinary tract infection)   . Blockage of coronary artery of heart     Current Outpatient Prescriptions  Medication Sig Dispense Refill  . calcium-vitamin D (OSCAL WITH D) 500-200 MG-UNIT per tablet Take 1 tablet by mouth daily.      . clopidogrel (PLAVIX) 75 MG tablet Take 1 tablet (75 mg total) by mouth daily.  30 tablet  1  . Cranberry 250 MG CAPS Take 1 capsule by mouth daily.      . cyanocobalamin 500 MCG tablet Take 500 mcg by mouth daily.      Marland Kitchen escitalopram (LEXAPRO) 10 MG tablet Take 5 mg by mouth daily.      . ferrous sulfate 325 (65 FE) MG tablet Take 325 mg by mouth daily with breakfast.      . furosemide (LASIX) 40 MG tablet 2 tablets in the am and 1 tablet in the pm  90 tablet  4  . isosorbide mononitrate (IMDUR) 60 MG 24 hr tablet 1 tablet by mouth daily  30 tablet  2  . isosorbide mononitrate (ISMO,MONOKET) 10  MG tablet Take by mouth 2 (two) times daily.      . Multiple Vitamin (MULITIVITAMIN WITH MINERALS) TABS Take 1 tablet by mouth daily.      . Multiple Vitamins-Minerals (ICAPS) CAPS Take 1 capsule by mouth 2 (two) times daily.      . pantoprazole (PROTONIX) 40 MG tablet Take 40 mg by mouth daily.      . polyethylene glycol (MIRALAX / GLYCOLAX) packet Take 17 g by mouth daily.      . pravastatin (PRAVACHOL) 40 MG tablet Take 40 mg by mouth daily.      . valsartan (DIOVAN) 160 MG tablet Take 1 tablet (160 mg total) by mouth daily.  30 tablet  1  . vitamin C (ASCORBIC ACID) 500 MG tablet Take 500 mg by mouth daily.      . vitamin E 400 UNIT capsule Take 400 Units by mouth daily.       No current facility-administered medications for this visit.    Allergies:    Allergies  Allergen Reactions  . Penicillins   . Sulfonamide Derivatives     REACTION: Rash and swelling  Social History:  The patient  reports that she has never smoked. She does not have any smokeless tobacco history on file. She reports that she does not drink alcohol or use illicit drugs.   Family History:  The patient's family history is not on file.   ROS:  Please see the history of present illness.  No nausea, vomiting.  No fevers, chills.  No focal weakness.  No dysuria.    All other systems reviewed and negative.   PHYSICAL EXAM: VS:  BP 128/44  Pulse 64  Ht 5' (1.524 m)  Wt 143 lb (64.864 kg)  BMI 27.93 kg/m2 Well nourished, well developed, in no acute distress HEENT: normal Neck: no JVD, no carotid bruits Cardiac:  normal S1, S2; RRR;  Lungs:  clear to auscultation bilaterally, no wheezing, rhonchi or rales Abd: soft, nontender, no hepatomegaly Ext: no edema Skin: warm and dry Neuro:   no focal abnormalities noted    ASSESSMENT AND PLAN:  CAD  Continue Plavix Tablet, 75 MG, TAKE ONE TABLET BY MOUTH EVERY DAY IMAGING: EKG    Wellington,Stacy 10/02/2012 11:09:07 AM > Anchor Dwan,JAY 10/02/2012 11:26:34 AM  > NSR, no ST segment changes   Notes: No angina.  Trying to avoid invasive testing. No indication for cath at this time. OK to give parking pass (handicapped) due to heart disease, abnormal stress test and inability to walk 200 feet.   2. Mixed hyperlipidemia  Continue Pravachol Tablet, 40MG , TAKE ONE TABLET BY MOUTH EVERY DAY Notes: LDL 90. ; LDL 77, HDL 37, TG 214 in 10/14   3. Essential hypertension, benign  Continue Diovan Tablet, 160 MG, TAKE ONE TABLET BY MOUTH EVERY DAY. Notes: Controlled.    4. Dyspnea  Continue Lasix Tablet, 40 MG, 2 tablets in the am and 1 tablet in the pm, Orally, as directed Notes: Improved. She misses some doses. OK to take 2 pills in the afternoon if she is more SHOB. Elevate legs at night.  Normal LVEF in 3/14 by echo.  Chronic diastolic heart failure.   Procedure Codes     Signed, Mina Marble, MD, [facility] 06/11/2013 11:17 AM

## 2013-06-11 NOTE — Patient Instructions (Signed)
Your physician has recommended you make the following change in your medication:   1. Continue lasix as you are taking it, but you can take an extra 40 mg tablet in the evening if needed for swelling and shortness of breath.  Your physician wants you to follow-up in: 6 months with Dr. Irish Lack. You will receive a reminder letter in the mail two months in advance. If you don't receive a letter, please call our office to schedule the follow-up appointment.

## 2013-06-18 ENCOUNTER — Other Ambulatory Visit: Payer: Self-pay

## 2013-06-18 MED ORDER — VALSARTAN 160 MG PO TABS: 160.0000 mg | ORAL_TABLET | Freq: Every day | ORAL | Status: DC

## 2013-06-26 ENCOUNTER — Encounter: Payer: Self-pay | Admitting: Interventional Cardiology

## 2013-07-13 ENCOUNTER — Other Ambulatory Visit: Payer: Self-pay | Admitting: *Deleted

## 2013-07-13 MED ORDER — CLOPIDOGREL BISULFATE 75 MG PO TABS: 75.0000 mg | ORAL_TABLET | Freq: Every day | ORAL | Status: DC

## 2013-08-04 ENCOUNTER — Other Ambulatory Visit: Payer: Self-pay

## 2013-08-04 MED ORDER — FUROSEMIDE 40 MG PO TABS: ORAL_TABLET | ORAL | Status: DC

## 2013-08-12 ENCOUNTER — Other Ambulatory Visit: Payer: Self-pay | Admitting: *Deleted

## 2013-08-12 MED ORDER — FUROSEMIDE 40 MG PO TABS: ORAL_TABLET | ORAL | Status: DC

## 2013-09-16 ENCOUNTER — Emergency Department (HOSPITAL_COMMUNITY): Payer: Medicare HMO

## 2013-09-16 ENCOUNTER — Inpatient Hospital Stay (HOSPITAL_COMMUNITY)
Admission: EM | Admit: 2013-09-16 | Discharge: 2013-09-30 | DRG: 371 | Disposition: A | Discharge status: Skilled Nursing Facility | Payer: Medicare HMO | Attending: Internal Medicine | Admitting: Internal Medicine

## 2013-09-16 ENCOUNTER — Encounter (HOSPITAL_COMMUNITY): Payer: Self-pay | Admitting: Emergency Medicine

## 2013-09-16 DIAGNOSIS — I2510 Atherosclerotic heart disease of native coronary artery without angina pectoris: Secondary | ICD-10-CM | POA: Diagnosis present

## 2013-09-16 DIAGNOSIS — I129 Hypertensive chronic kidney disease with stage 1 through stage 4 chronic kidney disease, or unspecified chronic kidney disease: Secondary | ICD-10-CM | POA: Diagnosis present

## 2013-09-16 DIAGNOSIS — K648 Other hemorrhoids: Secondary | ICD-10-CM | POA: Diagnosis present

## 2013-09-16 DIAGNOSIS — I959 Hypotension, unspecified: Secondary | ICD-10-CM | POA: Diagnosis present

## 2013-09-16 DIAGNOSIS — N183 Chronic kidney disease, stage 3 unspecified: Secondary | ICD-10-CM | POA: Diagnosis present

## 2013-09-16 DIAGNOSIS — F3289 Other specified depressive episodes: Secondary | ICD-10-CM | POA: Diagnosis present

## 2013-09-16 DIAGNOSIS — A09 Infectious gastroenteritis and colitis, unspecified: Secondary | ICD-10-CM | POA: Diagnosis present

## 2013-09-16 DIAGNOSIS — I509 Heart failure, unspecified: Secondary | ICD-10-CM | POA: Diagnosis present

## 2013-09-16 DIAGNOSIS — R627 Adult failure to thrive: Secondary | ICD-10-CM | POA: Diagnosis present

## 2013-09-16 DIAGNOSIS — R0609 Other forms of dyspnea: Secondary | ICD-10-CM | POA: Diagnosis present

## 2013-09-16 DIAGNOSIS — E871 Hypo-osmolality and hyponatremia: Secondary | ICD-10-CM | POA: Diagnosis present

## 2013-09-16 DIAGNOSIS — K219 Gastro-esophageal reflux disease without esophagitis: Secondary | ICD-10-CM | POA: Diagnosis present

## 2013-09-16 DIAGNOSIS — Z7902 Long term (current) use of antithrombotics/antiplatelets: Secondary | ICD-10-CM

## 2013-09-16 DIAGNOSIS — F32A Depression, unspecified: Secondary | ICD-10-CM | POA: Diagnosis present

## 2013-09-16 DIAGNOSIS — J4489 Other specified chronic obstructive pulmonary disease: Secondary | ICD-10-CM | POA: Diagnosis present

## 2013-09-16 DIAGNOSIS — K589 Irritable bowel syndrome without diarrhea: Secondary | ICD-10-CM | POA: Diagnosis present

## 2013-09-16 DIAGNOSIS — I480 Paroxysmal atrial fibrillation: Secondary | ICD-10-CM | POA: Diagnosis present

## 2013-09-16 DIAGNOSIS — I5033 Acute on chronic diastolic (congestive) heart failure: Secondary | ICD-10-CM | POA: Diagnosis present

## 2013-09-16 DIAGNOSIS — Z79899 Other long term (current) drug therapy: Secondary | ICD-10-CM

## 2013-09-16 DIAGNOSIS — E872 Acidosis, unspecified: Secondary | ICD-10-CM | POA: Diagnosis present

## 2013-09-16 DIAGNOSIS — E785 Hyperlipidemia, unspecified: Secondary | ICD-10-CM | POA: Diagnosis present

## 2013-09-16 DIAGNOSIS — J9819 Other pulmonary collapse: Secondary | ICD-10-CM | POA: Diagnosis present

## 2013-09-16 DIAGNOSIS — Z888 Allergy status to other drugs, medicaments and biological substances status: Secondary | ICD-10-CM

## 2013-09-16 DIAGNOSIS — K529 Noninfective gastroenteritis and colitis, unspecified: Secondary | ICD-10-CM

## 2013-09-16 DIAGNOSIS — E876 Hypokalemia: Secondary | ICD-10-CM | POA: Diagnosis present

## 2013-09-16 DIAGNOSIS — K644 Residual hemorrhoidal skin tags: Secondary | ICD-10-CM | POA: Diagnosis present

## 2013-09-16 DIAGNOSIS — K560 Paralytic ileus: Secondary | ICD-10-CM | POA: Diagnosis present

## 2013-09-16 DIAGNOSIS — N170 Acute kidney failure with tubular necrosis: Secondary | ICD-10-CM

## 2013-09-16 DIAGNOSIS — K921 Melena: Secondary | ICD-10-CM | POA: Clinically undetermined

## 2013-09-16 DIAGNOSIS — I5032 Chronic diastolic (congestive) heart failure: Secondary | ICD-10-CM

## 2013-09-16 DIAGNOSIS — A039 Shigellosis, unspecified: Secondary | ICD-10-CM | POA: Diagnosis present

## 2013-09-16 DIAGNOSIS — A0472 Enterocolitis due to Clostridium difficile, not specified as recurrent: Principal | ICD-10-CM | POA: Diagnosis present

## 2013-09-16 DIAGNOSIS — F329 Major depressive disorder, single episode, unspecified: Secondary | ICD-10-CM | POA: Diagnosis present

## 2013-09-16 DIAGNOSIS — D72829 Elevated white blood cell count, unspecified: Secondary | ICD-10-CM | POA: Diagnosis present

## 2013-09-16 DIAGNOSIS — I4891 Unspecified atrial fibrillation: Secondary | ICD-10-CM | POA: Diagnosis present

## 2013-09-16 DIAGNOSIS — J449 Chronic obstructive pulmonary disease, unspecified: Secondary | ICD-10-CM

## 2013-09-16 DIAGNOSIS — R339 Retention of urine, unspecified: Secondary | ICD-10-CM | POA: Diagnosis present

## 2013-09-16 DIAGNOSIS — I10 Essential (primary) hypertension: Secondary | ICD-10-CM

## 2013-09-16 DIAGNOSIS — K5732 Diverticulitis of large intestine without perforation or abscess without bleeding: Secondary | ICD-10-CM | POA: Diagnosis present

## 2013-09-16 DIAGNOSIS — Z66 Do not resuscitate: Secondary | ICD-10-CM | POA: Diagnosis present

## 2013-09-16 DIAGNOSIS — R0989 Other specified symptoms and signs involving the circulatory and respiratory systems: Secondary | ICD-10-CM | POA: Diagnosis present

## 2013-09-16 DIAGNOSIS — Z880 Allergy status to penicillin: Secondary | ICD-10-CM

## 2013-09-16 DIAGNOSIS — E860 Dehydration: Secondary | ICD-10-CM | POA: Diagnosis present

## 2013-09-16 DIAGNOSIS — K311 Adult hypertrophic pyloric stenosis: Secondary | ICD-10-CM | POA: Diagnosis present

## 2013-09-16 HISTORY — DX: Unspecified macular degeneration: H35.30

## 2013-09-16 LAB — URINALYSIS, ROUTINE W REFLEX MICROSCOPIC
Bilirubin Urine: NEGATIVE
GLUCOSE, UA: NEGATIVE mg/dL
HGB URINE DIPSTICK: NEGATIVE
KETONES UR: NEGATIVE mg/dL
Leukocytes, UA: NEGATIVE
Nitrite: NEGATIVE
PROTEIN: NEGATIVE mg/dL
Specific Gravity, Urine: 1.03 (ref 1.005–1.030)
Urobilinogen, UA: 0.2 mg/dL (ref 0.0–1.0)
pH: 5.5 (ref 5.0–8.0)

## 2013-09-16 LAB — CBC WITH DIFFERENTIAL/PLATELET
BASOS PCT: 0 % (ref 0–1)
Basophils Absolute: 0 10*3/uL (ref 0.0–0.1)
EOS PCT: 1 % (ref 0–5)
Eosinophils Absolute: 0.1 10*3/uL (ref 0.0–0.7)
HEMATOCRIT: 31 % — AB (ref 36.0–46.0)
HEMOGLOBIN: 10.5 g/dL — AB (ref 12.0–15.0)
Lymphocytes Relative: 11 % — ABNORMAL LOW (ref 12–46)
Lymphs Abs: 1.4 10*3/uL (ref 0.7–4.0)
MCH: 32.3 pg (ref 26.0–34.0)
MCHC: 33.9 g/dL (ref 30.0–36.0)
MCV: 95.4 fL (ref 78.0–100.0)
MONO ABS: 1.4 10*3/uL — AB (ref 0.1–1.0)
MONOS PCT: 10 % (ref 3–12)
NEUTROS ABS: 10.4 10*3/uL — AB (ref 1.7–7.7)
Neutrophils Relative %: 78 % — ABNORMAL HIGH (ref 43–77)
Platelets: 197 10*3/uL (ref 150–400)
RBC: 3.25 MIL/uL — ABNORMAL LOW (ref 3.87–5.11)
RDW: 12.4 % (ref 11.5–15.5)
WBC: 13.3 10*3/uL — ABNORMAL HIGH (ref 4.0–10.5)

## 2013-09-16 LAB — COMPREHENSIVE METABOLIC PANEL
ALBUMIN: 2.8 g/dL — AB (ref 3.5–5.2)
ALT: 10 U/L (ref 0–35)
AST: 17 U/L (ref 0–37)
Alkaline Phosphatase: 85 U/L (ref 39–117)
BILIRUBIN TOTAL: 0.5 mg/dL (ref 0.3–1.2)
BUN: 18 mg/dL (ref 6–23)
CALCIUM: 8.7 mg/dL (ref 8.4–10.5)
CHLORIDE: 91 meq/L — AB (ref 96–112)
CO2: 26 meq/L (ref 19–32)
CREATININE: 1.29 mg/dL — AB (ref 0.50–1.10)
GFR calc Af Amer: 40 mL/min — ABNORMAL LOW (ref >90)
GFR, EST NON AFRICAN AMERICAN: 34 mL/min — AB (ref >90)
Glucose, Bld: 123 mg/dL — ABNORMAL HIGH (ref 70–99)
Potassium: 3.8 mEq/L (ref 3.7–5.3)
Sodium: 132 mEq/L — ABNORMAL LOW (ref 137–147)
Total Protein: 6.2 g/dL (ref 6.0–8.3)

## 2013-09-16 LAB — LIPASE, BLOOD: LIPASE: 13 U/L (ref 11–59)

## 2013-09-16 LAB — PHOSPHORUS: Phosphorus: 2.7 mg/dL (ref 2.3–4.6)

## 2013-09-16 LAB — POC OCCULT BLOOD, ED: Fecal Occult Bld: NEGATIVE

## 2013-09-16 LAB — MAGNESIUM: Magnesium: 1.6 mg/dL (ref 1.5–2.5)

## 2013-09-16 LAB — TSH: TSH: 6.22 u[IU]/mL — AB (ref 0.350–4.500)

## 2013-09-16 LAB — TROPONIN I

## 2013-09-16 LAB — I-STAT CG4 LACTIC ACID, ED: LACTIC ACID, VENOUS: 1.46 mmol/L (ref 0.5–2.2)

## 2013-09-16 MED ORDER — CIPROFLOXACIN IN D5W 400 MG/200ML IV SOLN
400.0000 mg | Freq: Once | INTRAVENOUS | Status: AC
  Administered 2013-09-16: 400 mg via INTRAVENOUS
  Filled 2013-09-16: qty 200

## 2013-09-16 MED ORDER — IOHEXOL 300 MG/ML  SOLN
75.0000 mL | Freq: Once | INTRAMUSCULAR | Status: AC | PRN
  Administered 2013-09-16: 75 mL via INTRAVENOUS

## 2013-09-16 MED ORDER — SODIUM CHLORIDE 0.9 % IV SOLN
INTRAVENOUS | Status: DC
Start: 2013-09-16 — End: 2013-09-20
  Administered 2013-09-16 – 2013-09-17 (×2): via INTRAVENOUS
  Administered 2013-09-18: 125 mL/h via INTRAVENOUS
  Administered 2013-09-20: via INTRAVENOUS

## 2013-09-16 MED ORDER — CLOPIDOGREL BISULFATE 75 MG PO TABS
75.0000 mg | ORAL_TABLET | Freq: Every day | ORAL | Status: DC
  Administered 2013-09-17 – 2013-09-30 (×14): 75 mg via ORAL
  Filled 2013-09-16 (×14): qty 1

## 2013-09-16 MED ORDER — ONDANSETRON HCL 4 MG/2ML IJ SOLN: 4.0000 mg | Freq: Four times a day (QID) | INTRAMUSCULAR | Status: DC | PRN

## 2013-09-16 MED ORDER — MORPHINE SULFATE 2 MG/ML IJ SOLN
1.0000 mg | INTRAMUSCULAR | Status: DC | PRN
  Filled 2013-09-16: qty 1

## 2013-09-16 MED ORDER — PANTOPRAZOLE SODIUM 40 MG PO TBEC
40.0000 mg | DELAYED_RELEASE_TABLET | Freq: Every day | ORAL | Status: DC
  Administered 2013-09-17 – 2013-09-21 (×5): 40 mg via ORAL
  Filled 2013-09-16 (×5): qty 1

## 2013-09-16 MED ORDER — ACETAMINOPHEN 650 MG RE SUPP: 650.0000 mg | Freq: Four times a day (QID) | RECTAL | Status: DC | PRN

## 2013-09-16 MED ORDER — ADULT MULTIVITAMIN W/MINERALS CH: 1.0000 | ORAL_TABLET | Freq: Every day | ORAL | Status: DC

## 2013-09-16 MED ORDER — ONDANSETRON HCL 4 MG/2ML IJ SOLN
4.0000 mg | Freq: Once | INTRAMUSCULAR | Status: AC
  Administered 2013-09-16: 4 mg via INTRAVENOUS
  Filled 2013-09-16: qty 2

## 2013-09-16 MED ORDER — ACETAMINOPHEN 325 MG PO TABS: 650.0000 mg | ORAL_TABLET | Freq: Four times a day (QID) | ORAL | Status: DC | PRN

## 2013-09-16 MED ORDER — FERROUS SULFATE 325 (65 FE) MG PO TABS
325.0000 mg | ORAL_TABLET | Freq: Every day | ORAL | Status: DC
  Administered 2013-09-17 – 2013-09-30 (×14): 325 mg via ORAL
  Filled 2013-09-16 (×15): qty 1

## 2013-09-16 MED ORDER — SODIUM CHLORIDE 0.9 % IV SOLN
Freq: Once | INTRAVENOUS | Status: AC
  Administered 2013-09-16: 14:00:00 via INTRAVENOUS

## 2013-09-16 MED ORDER — MORPHINE SULFATE 2 MG/ML IJ SOLN
2.0000 mg | Freq: Once | INTRAMUSCULAR | Status: AC
Start: 2013-09-16 — End: 2013-09-16
  Administered 2013-09-16: 2 mg via INTRAVENOUS
  Filled 2013-09-16: qty 1

## 2013-09-16 MED ORDER — CHLORHEXIDINE GLUCONATE 0.12 % MT SOLN
15.0000 mL | Freq: Two times a day (BID) | OROMUCOSAL | Status: DC
  Administered 2013-09-17 – 2013-09-30 (×24): 15 mL via OROMUCOSAL
  Filled 2013-09-16 (×32): qty 15

## 2013-09-16 MED ORDER — CYANOCOBALAMIN 500 MCG PO TABS
500.0000 ug | ORAL_TABLET | Freq: Every day | ORAL | Status: DC
  Administered 2013-09-17 – 2013-09-30 (×14): 500 ug via ORAL
  Filled 2013-09-16 (×14): qty 1

## 2013-09-16 MED ORDER — HYOSCYAMINE SULFATE 0.5 MG/ML IJ SOLN
0.2500 mg | Freq: Four times a day (QID) | INTRAMUSCULAR | Status: DC | PRN
  Filled 2013-09-16: qty 0.5

## 2013-09-16 MED ORDER — SODIUM CHLORIDE 0.9 % IV SOLN
INTRAVENOUS | Status: AC
  Administered 2013-09-16: 17:00:00 via INTRAVENOUS

## 2013-09-16 MED ORDER — VITAMIN C 500 MG PO TABS
500.0000 mg | ORAL_TABLET | Freq: Every day | ORAL | Status: DC
  Administered 2013-09-17 – 2013-09-30 (×14): 500 mg via ORAL
  Filled 2013-09-16 (×14): qty 1

## 2013-09-16 MED ORDER — BIOTENE DRY MOUTH MT LIQD
15.0000 mL | Freq: Two times a day (BID) | OROMUCOSAL | Status: DC
  Administered 2013-09-17 – 2013-09-29 (×21): 15 mL via OROMUCOSAL

## 2013-09-16 MED ORDER — IOHEXOL 300 MG/ML  SOLN
25.0000 mL | Freq: Once | INTRAMUSCULAR | Status: AC | PRN
  Administered 2013-09-16: 25 mL via ORAL

## 2013-09-16 MED ORDER — METRONIDAZOLE IN NACL 5-0.79 MG/ML-% IV SOLN
500.0000 mg | Freq: Once | INTRAVENOUS | Status: DC
  Filled 2013-09-16: qty 100

## 2013-09-16 MED ORDER — VITAMIN E 180 MG (400 UNIT) PO CAPS
400.0000 [IU] | ORAL_CAPSULE | Freq: Every day | ORAL | Status: DC
  Administered 2013-09-17 – 2013-09-30 (×14): 400 [IU] via ORAL
  Filled 2013-09-16 (×14): qty 1

## 2013-09-16 MED ORDER — ESCITALOPRAM OXALATE 5 MG PO TABS
5.0000 mg | ORAL_TABLET | Freq: Every day | ORAL | Status: DC
  Administered 2013-09-17 – 2013-09-30 (×14): 5 mg via ORAL
  Filled 2013-09-16 (×14): qty 1

## 2013-09-16 MED ORDER — CIPROFLOXACIN IN D5W 400 MG/200ML IV SOLN
400.0000 mg | INTRAVENOUS | Status: DC
  Administered 2013-09-16 – 2013-09-17 (×2): 400 mg via INTRAVENOUS
  Filled 2013-09-16 (×3): qty 200

## 2013-09-16 MED ORDER — ADULT MULTIVITAMIN W/MINERALS CH
1.0000 | ORAL_TABLET | Freq: Every day | ORAL | Status: DC
  Administered 2013-09-16 – 2013-09-30 (×15): 1 via ORAL
  Filled 2013-09-16 (×15): qty 1

## 2013-09-16 MED ORDER — SIMVASTATIN 20 MG PO TABS
20.0000 mg | ORAL_TABLET | Freq: Every day | ORAL | Status: DC
  Administered 2013-09-17 – 2013-09-27 (×11): 20 mg via ORAL
  Filled 2013-09-16 (×12): qty 1

## 2013-09-16 MED ORDER — METRONIDAZOLE IN NACL 5-0.79 MG/ML-% IV SOLN
500.0000 mg | Freq: Three times a day (TID) | INTRAVENOUS | Status: DC
  Administered 2013-09-16 – 2013-09-26 (×25): 500 mg via INTRAVENOUS
  Filled 2013-09-16 (×31): qty 100

## 2013-09-16 MED ORDER — ICAPS PO CAPS: 1.0000 | ORAL_CAPSULE | Freq: Two times a day (BID) | ORAL | Status: DC

## 2013-09-16 MED ORDER — OXYCODONE HCL 5 MG PO TABS
5.0000 mg | ORAL_TABLET | ORAL | Status: DC | PRN
  Administered 2013-09-16 – 2013-09-21 (×3): 5 mg via ORAL
  Filled 2013-09-16 (×5): qty 1

## 2013-09-16 MED ORDER — ONDANSETRON HCL 4 MG PO TABS: 4.0000 mg | ORAL_TABLET | Freq: Four times a day (QID) | ORAL | Status: DC | PRN

## 2013-09-16 MED ORDER — ONDANSETRON HCL 4 MG/2ML IJ SOLN: 4.0000 mg | Freq: Three times a day (TID) | INTRAMUSCULAR | Status: DC | PRN

## 2013-09-16 MED ORDER — ALBUTEROL SULFATE (2.5 MG/3ML) 0.083% IN NEBU
2.5000 mg | INHALATION_SOLUTION | Freq: Four times a day (QID) | RESPIRATORY_TRACT | Status: DC | PRN
  Administered 2013-09-20 – 2013-09-26 (×3): 2.5 mg via RESPIRATORY_TRACT
  Filled 2013-09-16 (×3): qty 3

## 2013-09-16 NOTE — ED Notes (Signed)
REPORT GIVEN TO THE RN ON 5000

## 2013-09-16 NOTE — ED Provider Notes (Signed)
CSN: [id number]     Arrival date & time 09/16/13  1223 History   First MD Initiated Contact with Patient 09/16/13 1245     Chief Complaint  Patient presents with  . Abdominal Pain  . Diarrhea  . Diverticulitis     (Consider location/radiation/quality/duration/timing/severity/associated sxs/prior Treatment) HPI Comments: Patient with three-day history of lower abdominal pain with diarrhea that is loose and watery. Daughter noticed some blood and mucous today. She was told at urgent care that she had diverticulitis and was started on Cipro and Flagyl 2 days ago. She had fever up to 101. No sick contacts. Pain started after eating barbecue over the weekend. No one else got sick. She's had diverticulitis in the past and this feels similar but much worse. Denies any chest pain or shortness of breath. Denies any dysuria hematuria. No vaginal bleeding or discharge. No recent travel. She has been on antibiotics for a UTI recently.  The history is provided by the patient.    Past Medical History  Diagnosis Date  . CHF (congestive heart failure)   . GERD (gastroesophageal reflux disease)   . Hypertension   . Diverticulitis   . UTI (urinary tract infection)   . Blockage of coronary artery of heart   . Macular degeneration    Past Surgical History  Procedure Laterality Date  . Cholecystectomy    . Appendectomy    . Vaginal hysterectomy    . Eye surgery     History reviewed. No pertinent family history. History  Substance Use Topics  . Smoking status: Never Smoker   . Smokeless tobacco: Not on file  . Alcohol Use: No   OB History   Grav Para Term Preterm Abortions TAB SAB Ect Mult Living                 Review of Systems  Constitutional: Positive for fever, activity change, appetite change and fatigue.  Respiratory: Negative for cough, chest tightness and shortness of breath.   Cardiovascular: Negative for chest pain.  Gastrointestinal: Positive for nausea, abdominal pain and  diarrhea. Negative for vomiting.  Genitourinary: Negative for dysuria, hematuria, vaginal bleeding and vaginal discharge.  Musculoskeletal: Positive for arthralgias and myalgias. Negative for back pain.  Skin: Negative for rash.  Neurological: Negative for dizziness, weakness and headaches.  A complete 10 system review of systems was obtained and all systems are negative except as noted in the HPI and PMH.      Allergies  Penicillins and Sulfonamide derivatives  Home Medications   Prior to Admission medications   Medication Sig Start Date End Date Taking? Authorizing Provider  acetaminophen (TYLENOL) 500 MG tablet Take 500-1,000 mg by mouth every 6 (six) hours as needed for moderate pain or headache.   Yes Historical Provider, MD  calcium-vitamin D (OSCAL WITH D) 500-200 MG-UNIT per tablet Take 1 tablet by mouth daily.   Yes Historical Provider, MD  ciprofloxacin (CIPRO) 500 MG tablet Take 500 mg by mouth 2 (two) times daily. 09/14/13 09/24/13 Yes Historical Provider, MD  clopidogrel (PLAVIX) 75 MG tablet Take 1 tablet (75 mg total) by mouth daily. 07/13/13  Yes Jettie Booze, MD  Cranberry 250 MG CAPS Take 1 capsule by mouth daily.   Yes Historical Provider, MD  cyanocobalamin 500 MCG tablet Take 500 mcg by mouth daily.   Yes Historical Provider, MD  escitalopram (LEXAPRO) 10 MG tablet Take 5 mg by mouth daily.   Yes Historical Provider, MD  ferrous sulfate 325 (65 FE)  MG tablet Take 325 mg by mouth daily with breakfast.   Yes Historical Provider, MD  furosemide (LASIX) 40 MG tablet Take 40-80 mg by mouth See admin instructions. Take 2 tablet in the morning and take 1 tablets at 3pm.  Pt may take 1-40mg  tablet extra in the evening if needed   Yes Historical Provider, MD  isosorbide mononitrate (IMDUR) 60 MG 24 hr tablet 1 tablet by mouth daily 06/02/13  Yes Jettie Booze, MD  loperamide (IMODIUM) 1 MG/5ML solution Take 3 mg by mouth as needed for diarrhea or loose stools.   Yes  Historical Provider, MD  meclizine (ANTIVERT) 25 MG tablet Take 25 mg by mouth 2 (two) times daily as needed for dizziness.  09/07/13  Yes Historical Provider, MD  metroNIDAZOLE (FLAGYL) 500 MG tablet Take 500 mg by mouth 2 (two) times daily. 09/14/13 09/24/13 Yes Historical Provider, MD  Multiple Vitamin (MULITIVITAMIN WITH MINERALS) TABS Take 1 tablet by mouth daily.   Yes Historical Provider, MD  Multiple Vitamins-Minerals (ICAPS) CAPS Take 1 capsule by mouth 2 (two) times daily.   Yes Historical Provider, MD  pantoprazole (PROTONIX) 40 MG tablet Take 40 mg by mouth daily.   Yes Historical Provider, MD  polyethylene glycol (MIRALAX / GLYCOLAX) packet Take 17 g by mouth daily as needed for mild constipation.    Yes Historical Provider, MD  pravastatin (PRAVACHOL) 40 MG tablet Take 40 mg by mouth daily.   Yes Historical Provider, MD  Spacer/Aero-Holding Chambers (AEROCHAMBER PLUS FLO-VU) MISC 1 each as needed.  07/15/13  Yes Historical Provider, MD  valsartan (DIOVAN) 160 MG tablet Take 1 tablet (160 mg total) by mouth daily. 06/18/13  Yes Jettie Booze, MD  vitamin C (ASCORBIC ACID) 500 MG tablet Take 500 mg by mouth daily.   Yes Historical Provider, MD  vitamin E 400 UNIT capsule Take 400 Units by mouth daily.   Yes Historical Provider, MD   BP 132/69  Pulse 101  Temp(Src) 98.5 F (36.9 C) (Oral)  Resp 16  Wt 139 lb (63.05 kg)  SpO2 96% Physical Exam  Constitutional: She is oriented to person, place, and time. She appears well-developed and well-nourished. No distress.  HENT:  Head: Normocephalic and atraumatic.  Mouth/Throat: Oropharynx is clear and moist. No oropharyngeal exudate.  Eyes: Conjunctivae and EOM are normal. Pupils are equal, round, and reactive to light.  Neck: Normal range of motion. Neck supple.  Cardiovascular: Normal rate, regular rhythm and normal heart sounds.   No murmur heard. Pulmonary/Chest: Effort normal and breath sounds normal. No respiratory distress.   Abdominal: Soft. There is tenderness. There is guarding. There is no rebound.  Diffuse tenderness with guarding.   Genitourinary:  External hemorroids, no gross blood  Musculoskeletal: Normal range of motion. She exhibits no edema and no tenderness.  Neurological: She is alert and oriented to person, place, and time. No cranial nerve deficit. She exhibits normal muscle tone. Coordination normal.  Skin: Skin is warm.    ED Course  Procedures (including critical care time) Labs Review Labs Reviewed  CBC WITH DIFFERENTIAL - Abnormal; Notable for the following:    WBC 13.3 (*)    RBC 3.25 (*)    Hemoglobin 10.5 (*)    HCT 31.0 (*)    Neutrophils Relative % 78 (*)    Neutro Abs 10.4 (*)    Lymphocytes Relative 11 (*)    Monocytes Absolute 1.4 (*)    All other components within normal limits  COMPREHENSIVE METABOLIC  PANEL - Abnormal; Notable for the following:    Sodium 132 (*)    Chloride 91 (*)    Glucose, Bld 123 (*)    Creatinine, Ser 1.29 (*)    Albumin 2.8 (*)    GFR calc non Af Amer 34 (*)    GFR calc Af Amer 40 (*)    All other components within normal limits  CLOSTRIDIUM DIFFICILE BY PCR  LIPASE, BLOOD  TROPONIN I  URINALYSIS, ROUTINE W REFLEX MICROSCOPIC  MAGNESIUM  PHOSPHORUS  TSH  COMPREHENSIVE METABOLIC PANEL  CBC  GI PATHOGEN PANEL BY PCR, STOOL  I-STAT CG4 LACTIC ACID, ED  POC OCCULT BLOOD, ED    Imaging Review Ct Abdomen Pelvis W Contrast  09/16/2013   CLINICAL DATA:  Diffuse abdominal pain.  Diarrhea.  EXAM: CT ABDOMEN AND PELVIS WITH CONTRAST  TECHNIQUE: Multidetector CT imaging of the abdomen and pelvis was performed using the standard protocol following bolus administration of intravenous contrast.  CONTRAST:  23mL OMNIPAQUE IOHEXOL 300 MG/ML  SOLN  COMPARISON:  CT scan dated 01/14/2011  FINDINGS: There is edema of the mucosa of the descending and sigmoid portions of the colon and also of the rectum. The findings consistent with colitis. There are some  diverticula in the sigmoid portion the colon but the appearance is not suggestive of diverticulitis. There is slight edema in the perirectal soft tissues as well as in the left pericolic gutter at around the sigmoid portion of the colon in the pelvis.  The liver, spleen, pancreas, adrenal glands, and kidneys demonstrate no acute abnormalities. Two benign appearing cyst on the right kidney, slightly larger than on the prior study. Gallbladder is been removed. No dilated bile ducts. Small bowel appears normal. No acute osseous abnormality.  IMPRESSION: Findings consistent with colitis involving the left side of the colon and extending to the rectum. This could be infectious or due to ischemia.   Electronically Signed   By: Rozetta Nunnery M.D.   On: 09/16/2013 15:36   Dg Abd Acute W/chest  09/16/2013   CLINICAL DATA:  Abdominal pain  EXAM: ACUTE ABDOMEN SERIES (ABDOMEN 2 VIEW & CHEST 1 VIEW)  COMPARISON:  CT from earlier in the same day.  FINDINGS: Cardiac shadow is within normal limits. The lungs demonstrate mild interstitial changes without focal infiltrate. Scattered contrast material is noted throughout the large and small bowel consistent with the recent CT. Contrast is also noted within the urinary bladder. No free air is seen. No obstructive changes noted no acute bony abnormality is seen.  IMPRESSION: No acute abnormality is noted.   Electronically Signed   By: Inez Catalina M.D.   On: 09/16/2013 15:44     EKG Interpretation   Date/Time:  Wednesday September 16 2013 12:43:51 EDT Ventricular Rate:  75 PR Interval:  189 QRS Duration: 89 QT Interval:  400 QTC Calculation: 447 R Axis:   7 Text Interpretation:  Sinus rhythm Low voltage, precordial leads  Borderline repolarization abnormality No significant change was found  Confirmed by Wyvonnia Dusky  MD, Kristl Morioka [phone]) on 09/16/2013 1:32:13 PM      MDM   Final diagnoses:  Colitis  Dehydration  3 day history of abdominal pain, 1 day history of diarrhea  with reported streaks of blood.  No vomiting.  Diffuse tenderness without peritoneal signs.  FOBT negative, poor sample. AAS neg. Lactate normal, WBC 13.  Colitis on CT without abscess. Doubt ischemic colitis with benign exam and normal lactate. Cr 1.3 from 0.7  IV hydration given, IV cipro and flagyl.  Admission dw Dr. Dyann Kief for IV fluids and antibiotics as patient has not been improving with PO treatment.  BP 132/69  Pulse 101  Temp(Src) 98.5 F (36.9 C) (Oral)  Resp 16  Wt 139 lb (63.05 kg)  SpO2 96%    Ezequiel Essex, MD 09/16/13 1825

## 2013-09-16 NOTE — ED Notes (Signed)
TO ED via [facility] with c/o abd pain, diarrhea for 3 days. Has a hx of diverticulitis, diarrhea has been watery, with some mucus and blood noted. Pt is ambulatory, alert, oriented x4.

## 2013-09-16 NOTE — ED Notes (Signed)
Pt in xray

## 2013-09-16 NOTE — ED Notes (Signed)
Lactic acid results given to Dr. Wyvonnia Dusky

## 2013-09-16 NOTE — H&P (Addendum)
[facility] History and Physical  Laura Zuniga NWG:[id number] DOB: 1917-11-02 DOA: 09/16/2013  Referring physician: Dr. Silvestre Gunner PCP: Wenda Low, MD   Chief Complaint: abd pain, nausea, diarrhea and general malaise  HPI: Laura Zuniga is a 80 y.o. female with PMH significant for HTN, chronic diastolic heart failure, CKD stage 3, GERD, hx of diverticulosis, HLD and COPD; came to ED complaining of 3-4 days of abdominal pain (diffuse, cramping in nature; with associated nausea and fever/chills), also reports mucous sanguineous diarrhea and general malaise and weakness. Patient reports she was seen over the weekend at [facility], prescribed antibiotics (flagyl and cipro); but symptoms has been continue gradually worsening and she is very weak and deconditioned now.  In ED was found with leukocytosis, acute on chronic renal failure, hyponatremia and soft BP. physical exam with mild dehydration.  Of note patient denies CP, SOB, cough, HA;s, dysuria, vomiting, hematemesis or any other acute complaints.    Review of Systems:  Negative except as otherwise mentioned on HPI.  Past Medical History  Diagnosis Date  . CHF (congestive heart failure)   . GERD (gastroesophageal reflux disease)   . Hypertension   . Diverticulitis   . UTI (urinary tract infection)   . Blockage of coronary artery of heart   . Macular degeneration    Past Surgical History  Procedure Laterality Date  . Cholecystectomy    . Appendectomy    . Vaginal hysterectomy    . Eye surgery     Social History:  reports that she has never smoked. She does not have any smokeless tobacco history on file. She reports that she does not drink alcohol or use illicit drugs.  Allergies  Allergen Reactions  . Penicillins     unknown  . Sulfonamide Derivatives     REACTION: Rash and swelling    Family history: significant for HTN and cholesterol; otherwise no contributory  Prior to Admission medications     Medication Sig Start Date End Date Taking? Authorizing Provider  acetaminophen (TYLENOL) 500 MG tablet Take 500-1,000 mg by mouth every 6 (six) hours as needed for moderate pain or headache.   Yes Historical Provider, MD  calcium-vitamin D (OSCAL WITH D) 500-200 MG-UNIT per tablet Take 1 tablet by mouth daily.   Yes Historical Provider, MD  ciprofloxacin (CIPRO) 500 MG tablet Take 500 mg by mouth 2 (two) times daily. 09/14/13 09/24/13 Yes Historical Provider, MD  clopidogrel (PLAVIX) 75 MG tablet Take 1 tablet (75 mg total) by mouth daily. 07/13/13  Yes Jettie Booze, MD  Cranberry 250 MG CAPS Take 1 capsule by mouth daily.   Yes Historical Provider, MD  cyanocobalamin 500 MCG tablet Take 500 mcg by mouth daily.   Yes Historical Provider, MD  escitalopram (LEXAPRO) 10 MG tablet Take 5 mg by mouth daily.   Yes Historical Provider, MD  ferrous sulfate 325 (65 FE) MG tablet Take 325 mg by mouth daily with breakfast.   Yes Historical Provider, MD  furosemide (LASIX) 40 MG tablet Take 40-80 mg by mouth See admin instructions. Take 2 tablet in the morning and take 1 tablets at 3pm.  Pt may take 1-40mg  tablet extra in the evening if needed   Yes Historical Provider, MD  isosorbide mononitrate (IMDUR) 60 MG 24 hr tablet 1 tablet by mouth daily 06/02/13  Yes Jettie Booze, MD  loperamide (IMODIUM) 1 MG/5ML solution Take 3 mg by mouth as needed for diarrhea or loose stools.   Yes Historical Provider,  MD  meclizine (ANTIVERT) 25 MG tablet Take 25 mg by mouth 2 (two) times daily as needed for dizziness.  09/07/13  Yes Historical Provider, MD  metroNIDAZOLE (FLAGYL) 500 MG tablet Take 500 mg by mouth 2 (two) times daily. 09/14/13 09/24/13 Yes Historical Provider, MD  Multiple Vitamin (MULITIVITAMIN WITH MINERALS) TABS Take 1 tablet by mouth daily.   Yes Historical Provider, MD  Multiple Vitamins-Minerals (ICAPS) CAPS Take 1 capsule by mouth 2 (two) times daily.   Yes Historical Provider, MD  pantoprazole  (PROTONIX) 40 MG tablet Take 40 mg by mouth daily.   Yes Historical Provider, MD  polyethylene glycol (MIRALAX / GLYCOLAX) packet Take 17 g by mouth daily as needed for mild constipation.    Yes Historical Provider, MD  pravastatin (PRAVACHOL) 40 MG tablet Take 40 mg by mouth daily.   Yes Historical Provider, MD  Spacer/Aero-Holding Chambers (AEROCHAMBER PLUS FLO-VU) MISC 1 each as needed.  07/15/13  Yes Historical Provider, MD  valsartan (DIOVAN) 160 MG tablet Take 1 tablet (160 mg total) by mouth daily. 06/18/13  Yes Jettie Booze, MD  vitamin C (ASCORBIC ACID) 500 MG tablet Take 500 mg by mouth daily.   Yes Historical Provider, MD  vitamin E 400 UNIT capsule Take 400 Units by mouth daily.   Yes Historical Provider, MD   Physical Exam: Filed Vitals:   09/16/13 1703  BP: 132/69  Pulse: 101  Temp: 98.5 F (36.9 C)  Resp: 16    BP 132/69  Pulse 101  Temp(Src) 98.5 F (36.9 C) (Oral)  Resp 16  Wt 63.05 kg (139 lb)  SpO2 96%  Head: Normocephalic and atraumatic.  Mouth/Throat: Oropharynx is clear and moist. No oropharyngeal exudate.  Eyes: Conjunctivae and EOM are normal. Pupils are equal, round, and reactive to light.  Neck: Normal range of motion. Neck supple.  Cardiovascular: Normal rate, regular rhythm and normal heart sounds.  No murmur heard.  Pulmonary/Chest: Effort normal and breath sounds normal. No respiratory distress.  Abdominal: Soft. Diffuse tenderness with guarding. Positive BS Musculoskeletal: Normal range of motion. She exhibits no edema and no tenderness.  Neurological: She is alert and oriented to person, place, and time. No cranial nerve deficit. She exhibits normal muscle tone. Coordination normal.  Skin: Skin is warm.            Labs on Admission:  Basic Metabolic Panel:  Recent Labs Lab 09/16/13 1304  NA 132*  K 3.8  CL 91*  CO2 26  GLUCOSE 123*  BUN 18  CREATININE 1.29*  CALCIUM 8.7   Liver Function Tests:  Recent Labs Lab  09/16/13 1304  AST 17  ALT 10  ALKPHOS 85  BILITOT 0.5  PROT 6.2  ALBUMIN 2.8*    Recent Labs Lab 09/16/13 1304  LIPASE 13   CBC:  Recent Labs Lab 09/16/13 1304  WBC 13.3*  NEUTROABS 10.4*  HGB 10.5*  HCT 31.0*  MCV 95.4  PLT 197   Cardiac Enzymes:  Recent Labs Lab 09/16/13 1304  TROPONINI <0.30    Radiological Exams on Admission: Ct Abdomen Pelvis W Contrast  09/16/2013   CLINICAL DATA:  Diffuse abdominal pain.  Diarrhea.  EXAM: CT ABDOMEN AND PELVIS WITH CONTRAST  TECHNIQUE: Multidetector CT imaging of the abdomen and pelvis was performed using the standard protocol following bolus administration of intravenous contrast.  CONTRAST:  78mL OMNIPAQUE IOHEXOL 300 MG/ML  SOLN  COMPARISON:  CT scan dated 01/14/2011  FINDINGS: There is edema of the mucosa of  the descending and sigmoid portions of the colon and also of the rectum. The findings consistent with colitis. There are some diverticula in the sigmoid portion the colon but the appearance is not suggestive of diverticulitis. There is slight edema in the perirectal soft tissues as well as in the left pericolic gutter at around the sigmoid portion of the colon in the pelvis.  The liver, spleen, pancreas, adrenal glands, and kidneys demonstrate no acute abnormalities. Two benign appearing cyst on the right kidney, slightly larger than on the prior study. Gallbladder is been removed. No dilated bile ducts. Small bowel appears normal. No acute osseous abnormality.  IMPRESSION: Findings consistent with colitis involving the left side of the colon and extending to the rectum. This could be infectious or due to ischemia.   Electronically Signed   By: Rozetta Nunnery M.D.   On: 09/16/2013 15:36   Dg Abd Acute W/chest  09/16/2013   CLINICAL DATA:  Abdominal pain  EXAM: ACUTE ABDOMEN SERIES (ABDOMEN 2 VIEW & CHEST 1 VIEW)  COMPARISON:  CT from earlier in the same day.  FINDINGS: Cardiac shadow is within normal limits. The lungs demonstrate  mild interstitial changes without focal infiltrate. Scattered contrast material is noted throughout the large and small bowel consistent with the recent CT. Contrast is also noted within the urinary bladder. No free air is seen. No obstructive changes noted no acute bony abnormality is seen.  IMPRESSION: No acute abnormality is noted.   Electronically Signed   By: Inez Catalina M.D.   On: 09/16/2013 15:44    EKG:  None   Assessment/Plan 1-abd pain and diarrhea: with reports of mucous-sanguineous stool. High concerns for infectious colitis. -admit to med-surg bed -IV antibiotics (cipro and flagyl) -Clear liquid diet, supportive care -will check GI pathogen panel -contact precautions -due to diarrhea will held laxatives for now. -check TSH  -PRN analgesics and antiemetics  2-HYPERLIPIDEMIA: continue statins  3-leukocytosis: due to #1. -follow trend. -continue empiric antibiotics -follow cx's  4-DEPRESSION: continue lexapro  5-HYPERTENSION: mild hypotension and dehydration on admission -will hold antihypertensive regimen overnight -resume in am base on clinical response -provide gentle fluid resuscitation  5-acute on chronic renal failure: stage 3 at baseline -due to dehydration and pre-renal azotemia; continuation of nephrotoxic agents contributing as well. -will hold ARB and lasix -provide gentle hydration -UA no demonstrating UTI -follow renal function trend   6-COPD (chronic obstructive pulmonary disease): stable. Denies SOB and currently no wheezing -will continue PRN albuterol  7-Diastolic CHF, chronic: compensated at this time. -will hold diuretics overnight -reassess volume status and resume lasix in am  8-Dehydration with hyponatremia: will provide gentle fluid resuscitation -follow electrolytes trend  9-general malaise and deconditioning: will ask PT to evaluate and provide rec's  DVT: SCD's (given the fact patient reports blood in her stools.  Code Status:  DNR Family Communication: daughters at bedside Disposition Plan: inpatient, LOS > 2 midnights; med-surg bed  Time spent: 50 minutes  Barton Dubois [facility] Pager [phone]  **Disclaimer: This note may have been dictated with voice recognition software. Similar sounding words can inadvertently be transcribed and this note may contain transcription errors which may not have been corrected upon publication of note.**

## 2013-09-16 NOTE — Progress Notes (Addendum)
NURSING PROGRESS NOTE  Laura Zuniga [id number] Admission Data: 09/16/2013 6:08 PM Attending Provider: Wenda Low, MD GBM:[id number], MD Code Status: DNR   Laura Zuniga is a 80 y.o. female patient admitted from ED:  -No acute distress noted.  -No complaints of shortness of breath.  -No complaints of chest pain.    Blood pressure 132/69, pulse 101, temperature 98.5 F (36.9 C), temperature source Oral, resp. rate 16, weight 63.05 kg (139 lb), SpO2 96.00%.   IV Fluids:  IV in place, occlusive dsg intact without redness, IV cath antecubital left, condition patent and no redness normal saline.   Allergies:  Penicillins and Sulfonamide derivatives  Past Medical History:   has a past medical history of CHF (congestive heart failure); GERD (gastroesophageal reflux disease); Hypertension; Diverticulitis; UTI (urinary tract infection); Blockage of coronary artery of heart; and Macular degeneration.  Past Surgical History:   has past surgical history that includes Cholecystectomy; Appendectomy; Vaginal hysterectomy; and Eye surgery.  Skin: discolored to bilateral lower legs R>L. Dry and flaky to bilateral legs. Bruise noted to L lateral foot. Right ring finger amputated, pt states "i cut it when i was using machine years ago"  Patient/Family orientated to room. Information packet given to patient/family. Admission inpatient armband information verified with patient/family to include name and date of birth and placed on patient arm. Side rails up x 2, fall assessment and education completed with patient/family. Patient/family able to verbalize understanding of risk associated with falls and verbalized understanding to call for assistance before getting out of bed. Call light within reach. Patient/family able to voice and demonstrate understanding of unit orientation instructions.    Will continue to evaluate and treat per MD orders.  Wallie Renshaw, RN

## 2013-09-16 NOTE — ED Notes (Signed)
Pt taken to radiology

## 2013-09-16 NOTE — ED Notes (Signed)
Saw regular dr yesterday, [facility], was given two antibiotics. Has continued to have diarrhea, though none today-- now feels weak and has pain in abd when sitting.

## 2013-09-16 NOTE — Progress Notes (Signed)
Report received from [place], [place] in ED for patient to be admitted into 5w11

## 2013-09-17 ENCOUNTER — Telehealth: Payer: Self-pay | Admitting: Interventional Cardiology

## 2013-09-17 LAB — CBC
HEMATOCRIT: 31.1 % — AB (ref 36.0–46.0)
HEMOGLOBIN: 10.6 g/dL — AB (ref 12.0–15.0)
MCH: 32.5 pg (ref 26.0–34.0)
MCHC: 34.1 g/dL (ref 30.0–36.0)
MCV: 95.4 fL (ref 78.0–100.0)
Platelets: 202 10*3/uL (ref 150–400)
RBC: 3.26 MIL/uL — AB (ref 3.87–5.11)
RDW: 12.5 % (ref 11.5–15.5)
WBC: 14.6 10*3/uL — ABNORMAL HIGH (ref 4.0–10.5)

## 2013-09-17 LAB — COMPREHENSIVE METABOLIC PANEL
ALBUMIN: 2.7 g/dL — AB (ref 3.5–5.2)
ALK PHOS: 71 U/L (ref 39–117)
ALT: 9 U/L (ref 0–35)
AST: 18 U/L (ref 0–37)
BILIRUBIN TOTAL: 0.5 mg/dL (ref 0.3–1.2)
BUN: 18 mg/dL (ref 6–23)
CO2: 22 mEq/L (ref 19–32)
Calcium: 8.3 mg/dL — ABNORMAL LOW (ref 8.4–10.5)
Chloride: 97 mEq/L (ref 96–112)
Creatinine, Ser: 1.29 mg/dL — ABNORMAL HIGH (ref 0.50–1.10)
GFR calc Af Amer: 40 mL/min — ABNORMAL LOW (ref >90)
GFR calc non Af Amer: 34 mL/min — ABNORMAL LOW (ref >90)
GLUCOSE: 124 mg/dL — AB (ref 70–99)
POTASSIUM: 3.4 meq/L — AB (ref 3.7–5.3)
Sodium: 133 mEq/L — ABNORMAL LOW (ref 137–147)
Total Protein: 5.9 g/dL — ABNORMAL LOW (ref 6.0–8.3)

## 2013-09-17 LAB — OCCULT BLOOD X 1 CARD TO LAB, STOOL: Fecal Occult Bld: NEGATIVE

## 2013-09-17 MED ORDER — ISOSORBIDE MONONITRATE ER 60 MG PO TB24
60.0000 mg | ORAL_TABLET | Freq: Every day | ORAL | Status: DC
  Administered 2013-09-17 – 2013-09-27 (×11): 60 mg via ORAL
  Filled 2013-09-17 (×11): qty 1

## 2013-09-17 NOTE — Telephone Encounter (Signed)
FYI to Dr. Irish Lack.

## 2013-09-17 NOTE — Progress Notes (Signed)
RT called to patients room to give breathing treatment to patient.  RT came to room and assessed patient.  BBS are clear and diminished.  Patient eating jello and comfortable at this time.  Sats were 90% on RA at rest.  After talking with family and patient RT decided to start patient on 2L [place] due to patient getting extremely short of breath when up walking or doing any kind of activity.  Patient stated that she has never smoked and does not use any respiratory medications at home.  RT did not feel that patient needed a treatment at this time.  RT will continue to monitor; made patient and family aware that if they had any questions or felt like she needed any more assistance with her breathing to let RN know and RT would come back to assist with patient.

## 2013-09-17 NOTE — Progress Notes (Signed)
Called ED and received report from ED RN at 1335. Awaiting pt arrival to unit.

## 2013-09-17 NOTE — Consult Note (Signed)
Subjective:   HPI  The patient is a 80 year old female who went to a barbecue on Sunday and after that developed a lot of diarrhea and abdominal pain. She started having large amounts of diarrhea. She went to an urgent care who gave her some antibiotics that she subsequently came to the ER because she wasn't feeling any better and she was admitted to the hospital. A CT scan of the abdomen was done which shows evidence of colitis in the left colon extending down to the rectum. This could be infectious or ischemic. She described her diarrhea as loose watery with mucus and some blood. She has not had any stool since yesterday. Her abdominal pain is a little better than yesterday. She has been unable to collect the stool specimen. She is being treated with antibiotics at this time.  Review of Systems Denies chest pain or shortness of breath  Past Medical History  Diagnosis Date  . CHF (congestive heart failure)   . GERD (gastroesophageal reflux disease)   . Hypertension   . Diverticulitis   . UTI (urinary tract infection)   . Blockage of coronary artery of heart   . Macular degeneration    Past Surgical History  Procedure Laterality Date  . Cholecystectomy    . Appendectomy    . Vaginal hysterectomy    . Eye surgery     History   Social History  . Marital Status: Widowed    Spouse Name: N/A    Number of Children: N/A  . Years of Education: N/A   Occupational History  . Not on file.   Social History Main Topics  . Smoking status: Never Smoker   . Smokeless tobacco: Not on file  . Alcohol Use: No  . Drug Use: No  . Sexual Activity: No   Other Topics Concern  . Not on file   Social History Narrative  . No narrative on file   family history is not on file. Current facility-administered medications:0.9 %  sodium chloride infusion, , Intravenous, Continuous, Wenda Low, MD, Last Rate: 75 mL/hr at 09/17/13 0405;  acetaminophen (TYLENOL) suppository 650 mg, 650 mg, Rectal,  Q6H PRN, Barton Dubois, MD;  acetaminophen (TYLENOL) tablet 650 mg, 650 mg, Oral, Q6H PRN, Barton Dubois, MD;  albuterol (PROVENTIL) (2.5 MG/3ML) 0.083% nebulizer solution 2.5 mg, 2.5 mg, Nebulization, Q6H PRN, Barton Dubois, MD antiseptic oral rinse (BIOTENE) solution 15 mL, 15 mL, Mouth Rinse, q12n4p, Wenda Low, MD;  chlorhexidine (PERIDEX) 0.12 % solution 15 mL, 15 mL, Mouth Rinse, BID, Wenda Low, MD, 15 mL at 09/17/13 0752;  ciprofloxacin (CIPRO) IVPB 400 mg, 400 mg, Intravenous, Q24H, Barton Dubois, MD, 400 mg at 09/16/13 1929;  clopidogrel (PLAVIX) tablet 75 mg, 75 mg, Oral, Daily, Barton Dubois, MD, 75 mg at 09/17/13 1029 cyanocobalamin tablet 500 mcg, 500 mcg, Oral, Daily, Barton Dubois, MD, 500 mcg at 09/17/13 1029;  escitalopram (LEXAPRO) tablet 5 mg, 5 mg, Oral, Daily, Barton Dubois, MD, 5 mg at 09/17/13 1030;  ferrous sulfate tablet 325 mg, 325 mg, Oral, Q breakfast, Barton Dubois, MD, 325 mg at 09/17/13 0751;  hyoscyamine (LEVSIN) 0.5 MG/ML injection 0.25 mg, 0.25 mg, Intravenous, Q6H PRN, Barton Dubois, MD metroNIDAZOLE (FLAGYL) IVPB 500 mg, 500 mg, Intravenous, Q8H, Barton Dubois, MD, 500 mg at 09/17/13 0751;  morphine 2 MG/ML injection 1 mg, 1 mg, Intravenous, Q3H PRN, Barton Dubois, MD;  multivitamin with minerals tablet 1 tablet, 1 tablet, Oral, Daily, Wenda Low, MD, 1 tablet at 09/17/13 1029;  ondansetron (  ZOFRAN) injection 4 mg, 4 mg, Intravenous, Q6H PRN, Barton Dubois, MD ondansetron [facility]) tablet 4 mg, 4 mg, Oral, Q6H PRN, Barton Dubois, MD;  oxyCODONE (Oxy IR/ROXICODONE) immediate release tablet 5 mg, 5 mg, Oral, Q4H PRN, Barton Dubois, MD, 5 mg at 09/16/13 2354;  pantoprazole (PROTONIX) EC tablet 40 mg, 40 mg, Oral, Daily, Barton Dubois, MD, 40 mg at 09/17/13 1000;  simvastatin (ZOCOR) tablet 20 mg, 20 mg, Oral, q1800, Barton Dubois, MD vitamin C (ASCORBIC ACID) tablet 500 mg, 500 mg, Oral, Daily, Barton Dubois, MD, 500 mg at 09/17/13 1029;  vitamin E capsule 400 Units,  400 Units, Oral, Daily, Barton Dubois, MD, 400 Units at 09/17/13 1029 Allergies  Allergen Reactions  . Penicillins     unknown  . Sulfonamide Derivatives     REACTION: Rash and swelling     Objective:     BP 101/59  Pulse 76  Temp(Src) 98.4 F (36.9 C) (Oral)  Resp 16  Ht 5\' 1"  (1.549 m)  Wt 63 kg (138 lb 14.2 oz)  BMI 26.26 kg/m2  SpO2 91%  She is in no distress  Nonicteric  Heart regular rhythm no murmurs  Lungs clear  Abdomen: Bowel sounds are present, the abdomen is soft, mild tenderness diffusely but no rebound  Laboratory No components found with this basename: d1      Assessment:     Probable infectious colitis, but it could be ischemic colitis      Plan:     Continue supportive care with IV fluids and IV antibiotics. She seems to be a little better than yesterday. Obtain GI pathogens panel if she is able to get a sample. I see no indication at this point for endoscopic evaluation. Follow clinically.

## 2013-09-17 NOTE — Progress Notes (Signed)
Subjective: Still some loose stool. Lower abdominal pain No N/V  Objective: Vital signs in last 24 hours: Temp:  [98.3 F (36.8 C)-98.7 F (37.1 C)] 98.4 F (36.9 C) (06/11 0540) Pulse Rate:  [69-101] 76 (06/11 0540) Resp:  [14-28] 16 (06/11 0540) BP: (90-132)/(33-77) 101/59 mmHg (06/11 0540) SpO2:  [91 %-98 %] 91 % (06/11 0540) Weight:  [63 kg (138 lb 14.2 oz)-63.05 kg (139 lb)] 63 kg (138 lb 14.2 oz) (06/11 0540) Weight change:  Last BM Date: 09/16/13  Intake/Output from previous day: 06/10 0701 - 06/11 0700 In: 1000 [P.O.:220; I.V.:680; IV Piggyback:100] Out: -  Intake/Output this shift:    General appearance: alert Resp: clear to auscultation bilaterally Cardio: regular rate and rhythm GI: soft BS present; tender diffuse lower quad  Lab Results:  Recent Labs  09/16/13 1304  WBC 13.3*  HGB 10.5*  HCT 31.0*  PLT 197   BMET  Recent Labs  09/16/13 1304  NA 132*  K 3.8  CL 91*  CO2 26  GLUCOSE 123*  BUN 18  CREATININE 1.29*  CALCIUM 8.7    Studies/Results: Ct Abdomen Pelvis W Contrast  09/16/2013   CLINICAL DATA:  Diffuse abdominal pain.  Diarrhea.  EXAM: CT ABDOMEN AND PELVIS WITH CONTRAST  TECHNIQUE: Multidetector CT imaging of the abdomen and pelvis was performed using the standard protocol following bolus administration of intravenous contrast.  CONTRAST:  23mL OMNIPAQUE IOHEXOL 300 MG/ML  SOLN  COMPARISON:  CT scan dated 01/14/2011  FINDINGS: There is edema of the mucosa of the descending and sigmoid portions of the colon and also of the rectum. The findings consistent with colitis. There are some diverticula in the sigmoid portion the colon but the appearance is not suggestive of diverticulitis. There is slight edema in the perirectal soft tissues as well as in the left pericolic gutter at around the sigmoid portion of the colon in the pelvis.  The liver, spleen, pancreas, adrenal glands, and kidneys demonstrate no acute abnormalities. Two benign  appearing cyst on the right kidney, slightly larger than on the prior study. Gallbladder is been removed. No dilated bile ducts. Small bowel appears normal. No acute osseous abnormality.  IMPRESSION: Findings consistent with colitis involving the left side of the colon and extending to the rectum. This could be infectious or due to ischemia.   Electronically Signed   By: Rozetta Nunnery M.D.   On: 09/16/2013 15:36   Dg Abd Acute W/chest  09/16/2013   CLINICAL DATA:  Abdominal pain  EXAM: ACUTE ABDOMEN SERIES (ABDOMEN 2 VIEW & CHEST 1 VIEW)  COMPARISON:  CT from earlier in the same day.  FINDINGS: Cardiac shadow is within normal limits. The lungs demonstrate mild interstitial changes without focal infiltrate. Scattered contrast material is noted throughout the large and small bowel consistent with the recent CT. Contrast is also noted within the urinary bladder. No free air is seen. No obstructive changes noted no acute bony abnormality is seen.  IMPRESSION: No acute abnormality is noted.   Electronically Signed   By: Inez Catalina M.D.   On: 09/16/2013 15:44    Medications: I have reviewed the patient's current medications.  Assessment/Plan: Abdominal pain/ diarrhea/ CT scan - with colitis ? Infectious or inflammatory h/o of Diarrhea ? Microscopic colitis- will get GI consult- continue IVF and Antibiotics Hypotension/ with dehydration: hold BP med  And Lasix A/C renal failure due to Dehydration- hold ARB- fluids; check renal function HTN/ CAD/ stable Weakness/ gait abnormal: PT therapy- could  benefit from SNF short term Social work consult.   LOS: 1 day   Shawni Volkov 09/17/2013, 7:25 AM

## 2013-09-17 NOTE — Progress Notes (Signed)
Office record reviewed patient had colonoscopy in 2002, history of diverticulosis, history of IBS. Laura Zuniga

## 2013-09-17 NOTE — Progress Notes (Signed)
PT Cancellation Note  Patient Details Name: Laura Zuniga MRN: [id number] DOB: 03/19/1918   Cancelled Treatment:    Reason Eval/Treat Not Completed: Patient at procedure or test/unavailable. RN providing education to pt, multiple family members. Will return later today.   Vyron Fronczak 09/17/2013, 1:51 PM Pager [phone]

## 2013-09-17 NOTE — Evaluation (Signed)
Physical Therapy Evaluation Patient Details Name: Laura Zuniga MRN: [id number] DOB: 01-15-1918 Today's Date: 09/17/2013   History of Present Illness  Adm 6/10 with colitis (?infection vs ischemia) PMHx- CHF, CAD, COPD, lives alone  Clinical Impression  Pt admitted with above. Pt with significant decline in her functional status due to generalized weakness, pain, and dyspnea. Pt currently with functional limitations due to the deficits listed below (see PT Problem List).  Pt will benefit from skilled PT to increase their independence and safety with mobility to allow discharge to the venue listed below. With daughter's encouragement, pt is agreeable to plan for short-term rehab at SNF prior to return home alone.      Follow Up Recommendations SNF    Equipment Recommendations  None recommended by PT    Recommendations for Other Services OT consult (if needed for SNF placement)     Precautions / Restrictions Precautions Precautions: Fall      Mobility  Bed Mobility Overal bed mobility: Needs Assistance Bed Mobility: Rolling;Sidelying to Sit Rolling: Min assist Sidelying to sit: Min assist;HOB elevated       General bed mobility comments: with rail and incr time; pt very independent and likes to do things herself  Transfers Overall transfer level: Needs assistance Equipment used: 1 person hand held assist Transfers: Sit to/from Stand Sit to Stand: Min assist         General transfer comment: wide base of support, slightly unsteady with lateral sway  Ambulation/Gait Ambulation/Gait assistance: Min assist Ambulation Distance (Feet): 3 Feet Assistive device: 1 person hand held assist Gait Pattern/deviations: Step-to pattern;Decreased stride length;Wide base of support   Gait velocity interpretation: <1.8 ft/sec, indicative of risk for recurrent falls General Gait Details: reaching to hold onto furniture; took hand held assist instead  Stairs             Wheelchair Mobility    Modified Rankin (Stroke Patients Only)       Balance Overall balance assessment: Needs assistance Sitting-balance support: Bilateral upper extremity supported;Feet supported Sitting balance-Leahy Scale: Poor Sitting balance - Comments: needed bil UE support due to abd pain and distension   Standing balance support: Single extremity supported Standing balance-Leahy Scale: Poor                               Pertinent Vitals/Pain Abdominal pain consistent throughout (despite repositioning); pre-medicated for pain prior to PT session;  In sitting, noted mild dyspnea, SaO2 87-92% on RA; reclined and instructed in slow, deep breathing with RN notified; placed dynamap pulse ox on pt with SaO2 90-92% with RN obtaining nasal cannula to go in to see pt    Home Living Family/patient expects to be discharged to:: Skilled nursing facility Living Arrangements: Alone               Additional Comments: Daughter present and spoke to pt about needing therapy at [facility] for ~2 weeks prior to home and pt then agreeable (pt resistant initially)    Prior Function Level of Independence: Independent with assistive device(s)         Comments: gardening; only uses rollator when goes out shopping with daughter     Hand Dominance        Extremity/Trunk Assessment   Upper Extremity Assessment: Generalized weakness           Lower Extremity Assessment: Generalized weakness      Cervical / Trunk Assessment: Normal  Communication   Communication: No difficulties  Cognition Arousal/Alertness: Awake/alert Behavior During Therapy: Flat affect Overall Cognitive Status: History of cognitive impairments - at baseline       Memory:  (Daughter corrected some history pt provided)              General Comments General comments (skin integrity, edema, etc.): Daughters present and very supportive. "She lives alone and does well. She still goes  out to the garden and we're not going to tell her she can't if that's what she enjoys." Encouraged pt to go to SNF for therapy prior to home alone (she refuses to stay with daughter, or them with her!)    Exercises        Assessment/Plan    PT Assessment Patient needs continued PT services  PT Diagnosis Difficulty walking;Generalized weakness;Acute pain   PT Problem List Decreased strength;Decreased activity tolerance;Decreased balance;Decreased mobility;Decreased cognition;Decreased knowledge of use of DME;Decreased knowledge of precautions;Cardiopulmonary status limiting activity;Pain  PT Treatment Interventions DME instruction;Gait training;Functional mobility training;Therapeutic activities;Balance training;Cognitive remediation;Patient/family education   PT Goals (Current goals can be found in the Care Plan section) Acute Rehab PT Goals Patient Stated Goal: return to her home alone PT Goal Formulation: With patient/family Time For Goal Achievement: 09/24/13 Potential to Achieve Goals: Good    Frequency Min 2X/week   Barriers to discharge Decreased caregiver support      Co-evaluation               End of Session Equipment Utilized During Treatment: Gait belt Activity Tolerance: Patient limited by fatigue;Patient limited by pain Patient left: in chair;with call bell/phone within reach;with chair alarm set;with family/visitor present Nurse Communication: Mobility status;Other (comment) (SaO2 90-91% at rest once in chair)         Time: 0223-3612 PT Time Calculation (min): 34 min   Charges:   PT Evaluation $Initial PT Evaluation Tier I: 1 Procedure PT Treatments $Therapeutic Activity: 23-37 mins   PT G Codes:          Daquavion Catala Oct 12, 2013, 4:51 PM Pager [phone]

## 2013-09-17 NOTE — Telephone Encounter (Signed)
New message     FYI Ms Larzelere is in the hosp--admitted yesterday.  Want Dr Irish Lack to know

## 2013-09-18 ENCOUNTER — Inpatient Hospital Stay (HOSPITAL_COMMUNITY): Payer: Medicare HMO

## 2013-09-18 LAB — CBC
HEMATOCRIT: 29 % — AB (ref 36.0–46.0)
Hemoglobin: 9.9 g/dL — ABNORMAL LOW (ref 12.0–15.0)
MCH: 32.7 pg (ref 26.0–34.0)
MCHC: 34.1 g/dL (ref 30.0–36.0)
MCV: 95.7 fL (ref 78.0–100.0)
Platelets: 214 10*3/uL (ref 150–400)
RBC: 3.03 MIL/uL — ABNORMAL LOW (ref 3.87–5.11)
RDW: 12.7 % (ref 11.5–15.5)
WBC: 17.1 10*3/uL — ABNORMAL HIGH (ref 4.0–10.5)

## 2013-09-18 LAB — BASIC METABOLIC PANEL
BUN: 24 mg/dL — ABNORMAL HIGH (ref 6–23)
CO2: 22 mEq/L (ref 19–32)
CREATININE: 1.99 mg/dL — AB (ref 0.50–1.10)
Calcium: 8.1 mg/dL — ABNORMAL LOW (ref 8.4–10.5)
Chloride: 95 mEq/L — ABNORMAL LOW (ref 96–112)
GFR, EST AFRICAN AMERICAN: 24 mL/min — AB (ref >90)
GFR, EST NON AFRICAN AMERICAN: 20 mL/min — AB (ref >90)
Glucose, Bld: 111 mg/dL — ABNORMAL HIGH (ref 70–99)
POTASSIUM: 3.4 meq/L — AB (ref 3.7–5.3)
Sodium: 132 mEq/L — ABNORMAL LOW (ref 137–147)

## 2013-09-18 LAB — GI PATHOGEN PANEL BY PCR, STOOL
C difficile toxin A/B: POSITIVE
CAMPYLOBACTER BY PCR: NEGATIVE
CRYPTOSPORIDIUM BY PCR: NEGATIVE
E COLI (ETEC) LT/ST: NEGATIVE
E COLI (STEC): NEGATIVE
E COLI 0157 BY PCR: NEGATIVE
G lamblia by PCR: NEGATIVE
Norovirus GI/GII: NEGATIVE
Rotavirus A by PCR: NEGATIVE
Salmonella by PCR: NEGATIVE
Shigella by PCR: POSITIVE

## 2013-09-18 LAB — CLOSTRIDIUM DIFFICILE BY PCR: Toxigenic C. Difficile by PCR: POSITIVE — AB

## 2013-09-18 MED ORDER — IOHEXOL 300 MG/ML  SOLN
25.0000 mL | INTRAMUSCULAR | Status: AC
  Administered 2013-09-18 (×2): 25 mL via INTRAVENOUS

## 2013-09-18 MED ORDER — VANCOMYCIN 50 MG/ML ORAL SOLUTION
125.0000 mg | Freq: Four times a day (QID) | ORAL | Status: DC
  Administered 2013-09-18 – 2013-09-21 (×12): 125 mg via ORAL
  Filled 2013-09-18 (×16): qty 2.5

## 2013-09-18 NOTE — Progress Notes (Signed)
Pt unable to void, In & out cath done at 2100 and drained 717ml of amber urine. Bladder scan done at 0500 with a result of 73ml, no c/o and no distress noted. MD on call notified of Bladder scan result, no new order received. Will wait for lab work/creatinine results. Dr Lysle Rubens will f/u per MD on call.

## 2013-09-18 NOTE — Progress Notes (Signed)
[facility] Progress Note  Subjective: The patient still has some complaints of abdominal pain. Stool study for Clostridium difficile is positive  Objective: Vital signs in last 24 hours: Temp:  [97.6 F (36.4 C)-98.5 F (36.9 C)] 98 F (36.7 C) (06/12 0506) Pulse Rate:  [72-86] 86 (06/12 0918) Resp:  [16-20] 18 (06/12 0506) BP: (105-110)/(58-62) 105/60 mmHg (06/12 0506) SpO2:  [88 %-95 %] 88 % (06/12 0918) Weight change:    PE:  She does not appear in any acute distress  Heart regular rhythm  Lungs clear  Abdomen: Bowel sounds present, soft, tenderness still in the lower abdomen  Lab Results: Results for orders placed during the hospital encounter of 09/16/13 (from the past 24 hour(s))  CLOSTRIDIUM DIFFICILE BY PCR     Status: Abnormal   Collection Time    09/17/13  8:21 PM      Result Value Ref Range   C difficile by pcr POSITIVE (*) NEGATIVE  OCCULT BLOOD X 1 CARD TO LAB, STOOL     Status: None   Collection Time    09/17/13  8:25 PM      Result Value Ref Range   Fecal Occult Bld NEGATIVE  NEGATIVE  CBC     Status: Abnormal   Collection Time    09/18/13  5:17 AM      Result Value Ref Range   WBC 17.1 (*) 4.0 - 10.5 K/uL   RBC 3.03 (*) 3.87 - 5.11 MIL/uL   Hemoglobin 9.9 (*) 12.0 - 15.0 g/dL   HCT 29.0 (*) 36.0 - 46.0 %   MCV 95.7  78.0 - 100.0 fL   MCH 32.7  26.0 - 34.0 pg   MCHC 34.1  30.0 - 36.0 g/dL   RDW 12.7  11.5 - 15.5 %   Platelets 214  150 - 400 K/uL  BASIC METABOLIC PANEL     Status: Abnormal   Collection Time    09/18/13  5:17 AM      Result Value Ref Range   Sodium 132 (*) 137 - 147 mEq/L   Potassium 3.4 (*) 3.7 - 5.3 mEq/L   Chloride 95 (*) 96 - 112 mEq/L   CO2 22  19 - 32 mEq/L   Glucose, Bld 111 (*) 70 - 99 mg/dL   BUN 24 (*) 6 - 23 mg/dL   Creatinine, Ser 1.99 (*) 0.50 - 1.10 mg/dL   Calcium 8.1 (*) 8.4 - 10.5 mg/dL   GFR calc non Af Amer 20 (*) >90 mL/min   GFR calc Af Amer 24 (*) >90 mL/min    Studies/Results: No  results found.    Assessment: C. difficile colitis  Plan: Continue Flagyl IV. Add vancomycin 125 mg by mouth 4 times a day. Discontinue Cipro.    Berwyn Bigley F 09/18/2013, 10:57 AM

## 2013-09-18 NOTE — Consult Note (Signed)
This patient is a 80-year-old female who went to a barbecue on Sunday, and after that developed copious diarrhea and abdominal pain.  She went to an urgent care and was treated with antibiotics. She subsequently went to the ER because she wasn't feeling any better and was admitted.  CT scan of the abdomen showed evidence of colitis in the left colon extending down to the rectum. C diff was positive.   She was on furosemide PTA. Creat Mar 2013 was 0.74. Admission creat was 1.29 mg/dl rising to 1.99 today.  BP was recorded at 128/69 on 6/10 day of admit, today BP is 93/54.   Hgb is down to 9.9 from 10.5.  Of note he has a history of hypertension and CHF.  In addition, she was unable to void today and had urinary retention today with 700cc remaining in the urinary bladder that was drained today.  She now has a foley.  No hydro by US. UA on admit neg.    Past Medical History  Diagnosis Date  . CHF (congestive heart failure)   . GERD (gastroesophageal reflux disease)   . Hypertension   . Diverticulitis   . UTI (urinary tract infection)   . Blockage of coronary artery of heart   . Macular degeneration    Past Surgical History  Procedure Laterality Date  . Cholecystectomy    . Appendectomy    . Vaginal hysterectomy    . Eye surgery     Social History:  reports that she has never smoked. She does not have any smokeless tobacco history on file. She reports that she does not drink alcohol or use illicit drugs. Allergies:  Allergies  Allergen Reactions  . Penicillins     unknown  . Sulfonamide Derivatives     REACTION: Rash and swelling   History reviewed. No pertinent family history.  Medications:  Scheduled: . antiseptic oral rinse  15 mL Mouth Rinse q12n4p  . chlorhexidine  15 mL Mouth Rinse BID  . clopidogrel  75 mg Oral Daily  . cyanocobalamin  500 mcg Oral Daily  . escitalopram  5 mg Oral Daily  . ferrous sulfate  325 mg Oral Q breakfast  . isosorbide mononitrate  60 mg Oral Daily   . metronidazole  500 mg Intravenous Q8H  . multivitamin with minerals  1 tablet Oral Daily  . pantoprazole  40 mg Oral Daily  . simvastatin  20 mg Oral q1800  . vancomycin  125 mg Oral 4 times per day  . vitamin C  500 mg Oral Daily  . vitamin E  400 Units Oral Daily     ROS: not able to obtain from pt  Blood pressure 93/54, pulse 66, temperature 97.4 F (36.3 C), temperature source Oral, resp. rate 18, height 5' 1" (1.549 m), weight 63 kg (138 lb 14.2 oz), SpO2 99.00%.  General appearance: cooperative Head: Normocephalic, without obvious abnormality, atraumatic Eyes: negative Ears: normal TM's and external ear canals both ears Nose: Nares normal. Septum midline. Mucosa normal. No drainage or sinus tenderness. Throat: lips, mucosa, and tongue normal; teeth and gums normal Chest wall: no tenderness GI: ditended and tympanetic Extremities: edema tr Skin: Skin color, texture, turgor normal. No rashes or lesions Neurologic: Grossly normal Results for orders placed during the hospital encounter of 09/16/13 (from the past 48 hour(s))  MAGNESIUM     Status: None   Collection Time    09/16/13  6:58 PM      Result Value Ref Range     Magnesium 1.6  1.5 - 2.5 mg/dL  PHOSPHORUS     Status: None   Collection Time    09/16/13  6:58 PM      Result Value Ref Range   Phosphorus 2.7  2.3 - 4.6 mg/dL  TSH     Status: Abnormal   Collection Time    09/16/13  6:58 PM      Result Value Ref Range   TSH 6.220 (*) 0.350 - 4.500 uIU/mL  CBC     Status: Abnormal   Collection Time    09/17/13  8:44 AM      Result Value Ref Range   WBC 14.6 (*) 4.0 - 10.5 K/uL   RBC 3.26 (*) 3.87 - 5.11 MIL/uL   Hemoglobin 10.6 (*) 12.0 - 15.0 g/dL   HCT 31.1 (*) 36.0 - 46.0 %   MCV 95.4  78.0 - 100.0 fL   MCH 32.5  26.0 - 34.0 pg   MCHC 34.1  30.0 - 36.0 g/dL   RDW 12.5  11.5 - 15.5 %   Platelets 202  150 - 400 K/uL  COMPREHENSIVE METABOLIC PANEL     Status: Abnormal   Collection Time    09/17/13  8:44 AM       Result Value Ref Range   Sodium 133 (*) 137 - 147 mEq/L   Potassium 3.4 (*) 3.7 - 5.3 mEq/L   Chloride 97  96 - 112 mEq/L   CO2 22  19 - 32 mEq/L   Glucose, Bld 124 (*) 70 - 99 mg/dL   BUN 18  6 - 23 mg/dL   Creatinine, Ser 1.29 (*) 0.50 - 1.10 mg/dL   Calcium 8.3 (*) 8.4 - 10.5 mg/dL   Total Protein 5.9 (*) 6.0 - 8.3 g/dL   Albumin 2.7 (*) 3.5 - 5.2 g/dL   AST 18  0 - 37 U/L   ALT 9  0 - 35 U/L   Alkaline Phosphatase 71  39 - 117 U/L   Total Bilirubin 0.5  0.3 - 1.2 mg/dL   GFR calc non Af Amer 34 (*) >90 mL/min   GFR calc Af Amer 40 (*) >90 mL/min   Comment: (NOTE)     The eGFR has been calculated using the CKD EPI equation.     This calculation has not been validated in all clinical situations.     eGFR's persistently <90 mL/min signify possible Chronic Kidney     Disease.  CLOSTRIDIUM DIFFICILE BY PCR     Status: Abnormal   Collection Time    09/17/13  8:21 PM      Result Value Ref Range   C difficile by pcr POSITIVE (*) NEGATIVE   Comment: CRITICAL RESULT CALLED TO, READ BACK BY AND VERIFIED WITH:     C.MEDFORD,RN 09/18/13 0839 BY BSLADE  OCCULT BLOOD X 1 CARD TO LAB, STOOL     Status: None   Collection Time    09/17/13  8:25 PM      Result Value Ref Range   Fecal Occult Bld NEGATIVE  NEGATIVE  CBC     Status: Abnormal   Collection Time    09/18/13  5:17 AM      Result Value Ref Range   WBC 17.1 (*) 4.0 - 10.5 K/uL   RBC 3.03 (*) 3.87 - 5.11 MIL/uL   Hemoglobin 9.9 (*) 12.0 - 15.0 g/dL   HCT 29.0 (*) 36.0 - 46.0 %   MCV 95.7  78.0 - 100.0 fL     MCH 32.7  26.0 - 34.0 pg   MCHC 34.1  30.0 - 36.0 g/dL   RDW 12.7  11.5 - 15.5 %   Platelets 214  150 - 400 K/uL  BASIC METABOLIC PANEL     Status: Abnormal   Collection Time    09/18/13  5:17 AM      Result Value Ref Range   Sodium 132 (*) 137 - 147 mEq/L   Potassium 3.4 (*) 3.7 - 5.3 mEq/L   Chloride 95 (*) 96 - 112 mEq/L   CO2 22  19 - 32 mEq/L   Glucose, Bld 111 (*) 70 - 99 mg/dL   BUN 24 (*) 6 - 23 mg/dL    Creatinine, Ser 1.99 (*) 0.50 - 1.10 mg/dL   Comment: DELTA CHECK NOTED   Calcium 8.1 (*) 8.4 - 10.5 mg/dL   GFR calc non Af Amer 20 (*) >90 mL/min   GFR calc Af Amer 24 (*) >90 mL/min   Comment: (NOTE)     The eGFR has been calculated using the CKD EPI equation.     This calculation has not been validated in all clinical situations.     eGFR's persistently <90 mL/min signify possible Chronic Kidney     Disease.   Ct Abdomen Pelvis Wo Contrast  09/18/2013   CLINICAL DATA:  C difficile colitis, continued abdominal pain  EXAM: CT ABDOMEN AND PELVIS WITHOUT CONTRAST  TECHNIQUE: Multidetector CT imaging of the abdomen and pelvis was performed following the standard protocol without IV contrast. Sagittal and coronal MPR images reconstructed from axial data set. Oral contrast present in GI tract.  COMPARISON:  09/16/2013  FINDINGS: Small bibasilar pleural effusions and atelectasis.  Small RIGHT renal cysts.  Scattered atherosclerotic calcification.  Within limitations of a nonenhanced exam, no other focal abnormalities of the liver, spleen, pancreas, kidneys, or adrenal glands.  Persistent wall thickening from distal transverse colon through rectum compatible with colitis.  Mild patchy wall thickening of the cecum.  Scattered pericolic infiltration particularly at the sigmoid colon.  Small amount of free fluid in pelvis and perihepatic.  Thickening of the lateral conal fascia bilaterally.  Stomach and small bowel loops unremarkable.  Suspect sigmoid diverticulosis.  Bladder decompressed by Foley catheter.  No mass, free air, or hernia.  Question enlarged RIGHT cardiophrenic angle lymph node 7 mm short axis image 9 versus small amount of anterior mediastinal fluid/ edema, not definitely seen on the 09/16/2013 exam.  IMPRESSION: Diffuse colitis as above similar distribution to previous study.  Slightly increased pericolic inflammation and newly identified small amount of ascites.  No evidence of bowel  obstruction or perforation.   Electronically Signed   By: Lavonia Dana M.D.   On: 09/18/2013 12:33   US Renal  09/18/2013   CLINICAL DATA:  Renal failure.  Urinary tract infection.  EXAM: RENAL/URINARY TRACT ULTRASOUND COMPLETE  COMPARISON:  CT of the abdomen and pelvis on 09/18/2013  FINDINGS: Right Kidney:  Length: 9.6 cm. Simple cysts are identified in the midpole region measuring 1.9 by 1.9 x 2.0 cm and 1.6 x 1.6 x 1.8 cm.  Left Kidney:  Length: 10.7 cm. Echogenicity within normal limits. No mass or hydronephrosis visualized.  Bladder:  The bladder is decompressed by a Foley.  IMPRESSION: 1. Right renal cyst. 2. No hydronephrosis. 3. Foley catheter.   Electronically Signed   By: Shon Hale M.D.   On: 09/18/2013 15:41    Assessment:  1 AKI due to relative hypotension in the aged  kidney, urinary retention, dehydration 2 C. Diff colitis 3 ?CKD  Plan: 1 Supportive RX as you are doing, drainage of the bladder 2 Renal function may worsen before it improves and I have informed pt and dtr.  , C 09/18/2013, 5:46 PM       

## 2013-09-18 NOTE — Progress Notes (Signed)
Subjective: Urine retention noted- I/O cath C/o Abdominal pain  Objective: Vital signs in last 24 hours: Temp:  [97.6 F (36.4 C)-98.5 F (36.9 C)] 98 F (36.7 C) (06/12 0506) Pulse Rate:  [72-77] 72 (06/12 0506) Resp:  [16-20] 18 (06/12 0506) BP: (105-110)/(58-62) 105/60 mmHg (06/12 0506) SpO2:  [90 %-95 %] 95 % (06/12 0506) Weight change:  Last BM Date: 09/17/13  Intake/Output from previous day: 06/11 0701 - 06/12 0700 In: -  Out: 700 [Urine:700] Intake/Output this shift:    General appearance: alert Resp: clear to auscultation bilaterally Cardio: regular rate and rhythm GI: soft, tender duffusely, BS present  Lab Results:  Recent Labs  09/17/13 0844 09/18/13 0517  WBC 14.6* 17.1*  HGB 10.6* 9.9*  HCT 31.1* 29.0*  PLT 202 214   BMET  Recent Labs  09/17/13 0844 09/18/13 0517  NA 133* 132*  K 3.4* 3.4*  CL 97 95*  CO2 22 22  GLUCOSE 124* 111*  BUN 18 24*  CREATININE 1.29* 1.99*  CALCIUM 8.3* 8.1*    Studies/Results: Ct Abdomen Pelvis W Contrast  09/16/2013   CLINICAL DATA:  Diffuse abdominal pain.  Diarrhea.  EXAM: CT ABDOMEN AND PELVIS WITH CONTRAST  TECHNIQUE: Multidetector CT imaging of the abdomen and pelvis was performed using the standard protocol following bolus administration of intravenous contrast.  CONTRAST:  75mL OMNIPAQUE IOHEXOL 300 MG/ML  SOLN  COMPARISON:  CT scan dated 01/14/2011  FINDINGS: There is edema of the mucosa of the descending and sigmoid portions of the colon and also of the rectum. The findings consistent with colitis. There are some diverticula in the sigmoid portion the colon but the appearance is not suggestive of diverticulitis. There is slight edema in the perirectal soft tissues as well as in the left pericolic gutter at around the sigmoid portion of the colon in the pelvis.  The liver, spleen, pancreas, adrenal glands, and kidneys demonstrate no acute abnormalities. Two benign appearing cyst on the right kidney, slightly  larger than on the prior study. Gallbladder is been removed. No dilated bile ducts. Small bowel appears normal. No acute osseous abnormality.  IMPRESSION: Findings consistent with colitis involving the left side of the colon and extending to the rectum. This could be infectious or due to ischemia.   Electronically Signed   By: Rozetta Nunnery M.D.   On: 09/16/2013 15:36   Dg Abd Acute W/chest  09/16/2013   CLINICAL DATA:  Abdominal pain  EXAM: ACUTE ABDOMEN SERIES (ABDOMEN 2 VIEW & CHEST 1 VIEW)  COMPARISON:  CT from earlier in the same day.  FINDINGS: Cardiac shadow is within normal limits. The lungs demonstrate mild interstitial changes without focal infiltrate. Scattered contrast material is noted throughout the large and small bowel consistent with the recent CT. Contrast is also noted within the urinary bladder. No free air is seen. No obstructive changes noted no acute bony abnormality is seen.  IMPRESSION: No acute abnormality is noted.   Electronically Signed   By: Inez Catalina M.D.   On: 09/16/2013 15:44    Medications: I have reviewed the patient's current medications.  Assessment/Plan: Abdominal pain/ diarrhea/ CT scan - with colitis worse WBC, more tender this am-C. Diff pending ? Ischemic colitis; vs infectious- on antibiotic repeat CT scan; may need surgical consult; GI following.  Hypotension/ with dehydration: hold BP med And Lasix - IVF A/C renal failure  fluids; worse renal function- check U/s renal- r/o obstruction- foley catheter to close monitor I/O HTN/ CAD/h/o Diastolic  CHF- stable  Weakness/ gait abnormal: PT therapy- could benefit from SNF short term     LOS: 2 days   Juliano Mceachin 09/18/2013, 7:03 AM

## 2013-09-18 NOTE — Clinical Social Work Placement (Addendum)
Clinical Social Work Department CLINICAL SOCIAL WORK PLACEMENT NOTE 09/18/2013  Patient:  Laura Zuniga, Laura Zuniga  Account Number:  [id number] [place] date:  09/16/2013  Clinical Social Worker:  Kemper Durie, [place]  Date/time:  09/18/2013 10:00 AM  Clinical Social Work is seeking post-discharge placement for this patient at the following level of care:   [facility]   (*CSW will update this form in Epic as items are completed)   09/18/2013  Patient/family provided with [facility] Department of Clinical Social Work's list of facilities offering this level of care within the geographic area requested by the patient (or if unable, by the patient's family).  09/18/2013  Patient/family informed of their freedom to choose among providers that offer the needed level of care, that participate in Medicare, Medicaid or managed care program needed by the patient, have an available bed and are willing to accept the patient.  09/18/2013  Patient/family informed of [facility]' ownership interest in [facility], as well as of the fact that they are under no obligation to receive care at this facility.  PASARR submitted to EDS on 09/18/2013 PASARR number received on 09/18/2013  FL2 transmitted to all facilities in geographic area requested by pt/family on  09/18/2013 FL2 transmitted to all facilities within larger geographic area on   Patient informed that his/her managed care company has contracts with or will negotiate with  certain facilities, including the following:     Patient/family informed of bed offers received:  09/18/2013 Patient chooses bed at [facility] 09/20/2013  Physician recommends and patient chooses bed at    Patient to be transferred to  on   Patient to be transferred to facility by  Patient and family notified of transfer on  Name of family member notified:    The following physician request were entered in Epic:   Additional Comments: Patient  encouraged family to determine which facility they will want.    Liz Beach MSW, [place], [place], [phone]

## 2013-09-18 NOTE — Progress Notes (Signed)
Physical Therapy Treatment Patient Details Name: Laura Zuniga MRN: [id number] DOB: 27-Oct-1917 Today's Date: 09/18/2013    History of Present Illness Adm 6/10 with colitis, Cdiff positive, PMHx- CHF, CAD, COPD, lives alone    PT Comments    Pt very pleasant and progressing with mobility. Pt on BSC on arrival with assist for pericare and use of underwear for gait. Pt encouraged to continued bil LE HEP, be OOB for all meals and ambulate with nursing over the weekend. Will continue to follow.   Follow Up Recommendations  SNF     Equipment Recommendations  None recommended by PT    Recommendations for Other Services       Precautions / Restrictions Precautions Precautions: Fall Precaution Comments: watch sats Restrictions Weight Bearing Restrictions: No    Mobility  Bed Mobility Overal bed mobility: Needs Assistance Bed Mobility: Sit to Supine       Sit to supine: Min assist   General bed mobility comments: min assist to elevate bil LE to surface  Transfers Overall transfer level: Needs assistance     Sit to Stand: Mod assist;Min assist         General transfer comment: initial stand from [facility] with mod assist due to heavy posterior lean, 2 repeated trials from [facility] and recliner with min assist with cues for hand placement, anterior translation and safety  Ambulation/Gait Ambulation/Gait assistance: Min assist Ambulation Distance (Feet): 60 Feet (30', 60' with seated rest) Assistive device: Rolling walker (2 wheeled) Gait Pattern/deviations: Step-through pattern;Decreased stride length;Trunk flexed   Gait velocity interpretation: <1.8 ft/sec, indicative of risk for recurrent falls General Gait Details: cues for posture, position in RW and direction to room   Stairs            Wheelchair Mobility    Modified Rankin (Stroke Patients Only)       Balance Overall balance assessment: Needs assistance   Sitting balance-Leahy Scale: Poor   Postural  control: Posterior lean   Standing balance-Leahy Scale: Poor                      Cognition Arousal/Alertness: Awake/alert Behavior During Therapy: WFL for tasks assessed/performed Overall Cognitive Status: History of cognitive impairments - at baseline                      Exercises General Exercises - Lower Extremity Straight Leg Raises: AROM;Both;15 reps;Supine    General Comments        Pertinent Vitals/Pain sats 95% on RA at rest 88-95% on RA with ambulation 1L end of session with sats 97% HR 73-86 5/10 abdominal pain    Home Living                      Prior Function            PT Goals (current goals can now be found in the care plan section) Progress towards PT goals: Progressing toward goals    Frequency       PT Plan Current plan remains appropriate    Co-evaluation             End of Session Equipment Utilized During Treatment: Gait belt Activity Tolerance: Patient limited by fatigue Patient left: in bed;with call bell/phone within reach;with bed alarm set     Time: 0800-0826 PT Time Calculation (min): 26 min  Charges:  $Gait Training: 8-22 mins $Therapeutic Activity: 8-22 mins  G CodesMelford Aase 2013/09/23, 9:21 AM Elwyn Reach, [place]

## 2013-09-18 NOTE — Clinical Social Work Psychosocial (Signed)
Clinical Social Work Department BRIEF PSYCHOSOCIAL ASSESSMENT 09/18/2013  Patient:  Laura Zuniga, Laura Zuniga     Account Number:  [id number]     Admit date:  09/16/2013  Clinical Social Worker:  Lovey Newcomer  Date/Time:  09/18/2013 10:00 AM  Referred by:  Physician  Date Referred:  09/18/2013 Referred for  SNF Placement   Other Referral:   Interview type:  Family Other interview type:   Patient's daughters interviewed at bedside to complete assessment.    PSYCHOSOCIAL DATA Living Status:  ALONE Admitted from facility:   Level of care:   Primary support name:  [name] and Gregary Cromer [phone]) Primary support relationship to patient:  CHILD, ADULT Degree of support available:   Support is good.    CURRENT CONCERNS Current Concerns  Post-Acute Placement   Other Concerns:    SOCIAL WORK ASSESSMENT / PLAN CSW met with daughter Hassan Rowan at bedside to complete assessment. Patient and daughter agreeable to a SNF search/placement. CSW explained SNF search/placement process and answered family's questions. CSW explained that patient will need to go to a facility that contracts with her insurance and that [facility] will have to authorize placement for patient. Patient and daughter [name]. Patient and daughter are happy that SNF may be an option for patient as patient currently lives alone and feels that it would be unsafe for patient to return home alone.   Assessment/plan status:  Psychosocial Support/Ongoing Assessment of Needs Other assessment/ plan:   Complete FL2, Fax, PASRR   Information/referral to community resources:   CSW contact information and SNF list given to patient.    PATIENT'S/FAMILY'S RESPONSE TO PLAN OF CARE: Patient and family agreeable to SNF placement. CSW will follow up with bed offers and assist with DC when appropriate.       Liz Beach MSW, [place], [place], [phone]

## 2013-09-19 ENCOUNTER — Inpatient Hospital Stay (HOSPITAL_COMMUNITY): Payer: Medicare HMO

## 2013-09-19 LAB — CBC
HEMATOCRIT: 30.4 % — AB (ref 36.0–46.0)
Hemoglobin: 10.3 g/dL — ABNORMAL LOW (ref 12.0–15.0)
MCH: 32 pg (ref 26.0–34.0)
MCHC: 33.9 g/dL (ref 30.0–36.0)
MCV: 94.4 fL (ref 78.0–100.0)
PLATELETS: 247 10*3/uL (ref 150–400)
RBC: 3.22 MIL/uL — ABNORMAL LOW (ref 3.87–5.11)
RDW: 12.5 % (ref 11.5–15.5)
WBC: 18.5 10*3/uL — ABNORMAL HIGH (ref 4.0–10.5)

## 2013-09-19 LAB — BASIC METABOLIC PANEL
BUN: 31 mg/dL — AB (ref 6–23)
CALCIUM: 8.4 mg/dL (ref 8.4–10.5)
CHLORIDE: 94 meq/L — AB (ref 96–112)
CO2: 22 meq/L (ref 19–32)
CREATININE: 2.31 mg/dL — AB (ref 0.50–1.10)
GFR calc Af Amer: 20 mL/min — ABNORMAL LOW (ref >90)
GFR calc non Af Amer: 17 mL/min — ABNORMAL LOW (ref >90)
GLUCOSE: 107 mg/dL — AB (ref 70–99)
Potassium: 4.4 mEq/L (ref 3.7–5.3)
Sodium: 130 mEq/L — ABNORMAL LOW (ref 137–147)

## 2013-09-19 MED ORDER — SACCHAROMYCES BOULARDII 250 MG PO CAPS
250.0000 mg | ORAL_CAPSULE | Freq: Two times a day (BID) | ORAL | Status: DC
  Administered 2013-09-19 – 2013-09-30 (×22): 250 mg via ORAL
  Filled 2013-09-19 (×25): qty 1

## 2013-09-19 MED ORDER — GUAIFENESIN ER 600 MG PO TB12
600.0000 mg | ORAL_TABLET | Freq: Two times a day (BID) | ORAL | Status: DC | PRN
  Administered 2013-09-19: 600 mg via ORAL
  Filled 2013-09-19: qty 1

## 2013-09-19 NOTE — Progress Notes (Signed)
Subjective: Per daughter, patient still with frequent bowel movements, she's afebrile, hemodynamically stable.  Objective: Vital signs in last 24 hours: Temp:  [97.4 F (36.3 C)-98.3 F (36.8 C)] 97.6 F (36.4 C) (06/13 4270) Pulse Rate:  [62-70] 62 (06/13 0637) Resp:  [17-18] 17 (06/13 0637) BP: (93-120)/(54-63) 104/63 mmHg (06/13 0637) SpO2:  [98 %-99 %] 98 % (06/13 0637) Weight:  [74.8 kg (164 lb 14.5 oz)] 74.8 kg (164 lb 14.5 oz) (06/13 6237) Weight change:  Last BM Date: 09/18/13  Intake/Output from previous day: 06/12 0701 - 06/13 0700 In: 1560 [I.V.:1360; IV Piggyback:200] Out: 300 [Urine:300] Intake/Output this shift:    Resp: clear to auscultation bilaterally Cardio: regular rate and rhythm, S1, S2 normal, no murmur, click, rub or gallop GI: Distended, tympanic, high-pitched bowel sounds  Lab Results:  Results for orders placed during the hospital encounter of 09/16/13 (from the past 24 hour(s))  BASIC METABOLIC PANEL     Status: Abnormal   Collection Time    09/19/13  5:00 AM      Result Value Ref Range   Sodium 130 (*) 137 - 147 mEq/L   Potassium 4.4  3.7 - 5.3 mEq/L   Chloride 94 (*) 96 - 112 mEq/L   CO2 22  19 - 32 mEq/L   Glucose, Bld 107 (*) 70 - 99 mg/dL   BUN 31 (*) 6 - 23 mg/dL   Creatinine, Ser 2.31 (*) 0.50 - 1.10 mg/dL   Calcium 8.4  8.4 - 10.5 mg/dL   GFR calc non Af Amer 17 (*) >90 mL/min   GFR calc Af Amer 20 (*) >90 mL/min  CBC     Status: Abnormal   Collection Time    09/19/13  5:00 AM      Result Value Ref Range   WBC 18.5 (*) 4.0 - 10.5 K/uL   RBC 3.22 (*) 3.87 - 5.11 MIL/uL   Hemoglobin 10.3 (*) 12.0 - 15.0 g/dL   HCT 30.4 (*) 36.0 - 46.0 %   MCV 94.4  78.0 - 100.0 fL   MCH 32.0  26.0 - 34.0 pg   MCHC 33.9  30.0 - 36.0 g/dL   RDW 12.5  11.5 - 15.5 %   Platelets 247  150 - 400 K/uL      Studies/Results: Ct Abdomen Pelvis Wo Contrast  09/18/2013   CLINICAL DATA:  C difficile colitis, continued abdominal pain  EXAM: CT ABDOMEN  AND PELVIS WITHOUT CONTRAST  TECHNIQUE: Multidetector CT imaging of the abdomen and pelvis was performed following the standard protocol without IV contrast. Sagittal and coronal MPR images reconstructed from axial data set. Oral contrast present in GI tract.  COMPARISON:  09/16/2013  FINDINGS: Small bibasilar pleural effusions and atelectasis.  Small RIGHT renal cysts.  Scattered atherosclerotic calcification.  Within limitations of a nonenhanced exam, no other focal abnormalities of the liver, spleen, pancreas, kidneys, or adrenal glands.  Persistent wall thickening from distal transverse colon through rectum compatible with colitis.  Mild patchy wall thickening of the cecum.  Scattered pericolic infiltration particularly at the sigmoid colon.  Small amount of free fluid in pelvis and perihepatic.  Thickening of the lateral conal fascia bilaterally.  Stomach and small bowel loops unremarkable.  Suspect sigmoid diverticulosis.  Bladder decompressed by Foley catheter.  No mass, free air, or hernia.  Question enlarged RIGHT cardiophrenic angle lymph node 7 mm short axis image 9 versus small amount of anterior mediastinal fluid/ edema, not definitely seen on the 09/16/2013 exam.  IMPRESSION: Diffuse colitis as above similar distribution to previous study.  Slightly increased pericolic inflammation and newly identified small amount of ascites.  No evidence of bowel obstruction or perforation.   Electronically Signed   By: Lavonia Dana M.D.   On: 09/18/2013 12:33   US Renal  09/18/2013   CLINICAL DATA:  Renal failure.  Urinary tract infection.  EXAM: RENAL/URINARY TRACT ULTRASOUND COMPLETE  COMPARISON:  CT of the abdomen and pelvis on 09/18/2013  FINDINGS: Right Kidney:  Length: 9.6 cm. Simple cysts are identified in the midpole region measuring 1.9 by 1.9 x 2.0 cm and 1.6 x 1.6 x 1.8 cm.  Left Kidney:  Length: 10.7 cm. Echogenicity within normal limits. No mass or hydronephrosis visualized.  Bladder:  The bladder is  decompressed by a Foley.  IMPRESSION: 1. Right renal cyst. 2. No hydronephrosis. 3. Foley catheter.   Electronically Signed   By: Shon Hale M.D.   On: 09/18/2013 15:41    Medications:  Prior to Admission:  Prescriptions prior to admission  Medication Sig Dispense Refill  . acetaminophen (TYLENOL) 500 MG tablet Take 500-1,000 mg by mouth every 6 (six) hours as needed for moderate pain or headache.      . calcium-vitamin D (OSCAL WITH D) 500-200 MG-UNIT per tablet Take 1 tablet by mouth daily.      . ciprofloxacin (CIPRO) 500 MG tablet Take 500 mg by mouth 2 (two) times daily.      . clopidogrel (PLAVIX) 75 MG tablet Take 1 tablet (75 mg total) by mouth daily.  30 tablet  5  . Cranberry 250 MG CAPS Take 1 capsule by mouth daily.      . cyanocobalamin 500 MCG tablet Take 500 mcg by mouth daily.      Marland Kitchen escitalopram (LEXAPRO) 10 MG tablet Take 5 mg by mouth daily.      . ferrous sulfate 325 (65 FE) MG tablet Take 325 mg by mouth daily with breakfast.      . furosemide (LASIX) 40 MG tablet Take 40-80 mg by mouth See admin instructions. Take 2 tablet in the morning and take 1 tablets at 3pm.  Pt may take 1-40mg  tablet extra in the evening if needed      . isosorbide mononitrate (IMDUR) 60 MG 24 hr tablet 1 tablet by mouth daily  30 tablet  2  . loperamide (IMODIUM) 1 MG/5ML solution Take 3 mg by mouth as needed for diarrhea or loose stools.      . meclizine (ANTIVERT) 25 MG tablet Take 25 mg by mouth 2 (two) times daily as needed for dizziness.       . metroNIDAZOLE (FLAGYL) 500 MG tablet Take 500 mg by mouth 2 (two) times daily.      . Multiple Vitamin (MULITIVITAMIN WITH MINERALS) TABS Take 1 tablet by mouth daily.      . Multiple Vitamins-Minerals (ICAPS) CAPS Take 1 capsule by mouth 2 (two) times daily.      . pantoprazole (PROTONIX) 40 MG tablet Take 40 mg by mouth daily.      . polyethylene glycol (MIRALAX / GLYCOLAX) packet Take 17 g by mouth daily as needed for mild constipation.       .  pravastatin (PRAVACHOL) 40 MG tablet Take 40 mg by mouth daily.      Marland Kitchen Spacer/Aero-Holding Chambers (AEROCHAMBER PLUS FLO-VU) MISC 1 each as needed.       . valsartan (DIOVAN) 160 MG tablet Take 1 tablet (160 mg total) by  mouth daily.  30 tablet  4  . vitamin C (ASCORBIC ACID) 500 MG tablet Take 500 mg by mouth daily.      . vitamin E 400 UNIT capsule Take 400 Units by mouth daily.       Scheduled: . antiseptic oral rinse  15 mL Mouth Rinse q12n4p  . chlorhexidine  15 mL Mouth Rinse BID  . clopidogrel  75 mg Oral Daily  . cyanocobalamin  500 mcg Oral Daily  . escitalopram  5 mg Oral Daily  . ferrous sulfate  325 mg Oral Q breakfast  . isosorbide mononitrate  60 mg Oral Daily  . metronidazole  500 mg Intravenous Q8H  . multivitamin with minerals  1 tablet Oral Daily  . pantoprazole  40 mg Oral Daily  . simvastatin  20 mg Oral q1800  . vancomycin  125 mg Oral 4 times per day  . vitamin C  500 mg Oral Daily  . vitamin E  400 Units Oral Daily   Continuous: . sodium chloride 125 mL/hr (09/18/13 0948)   IWP:[id number], acetaminophen, albuterol, hyoscyamine, morphine injection, ondansetron (ZOFRAN) IV, ondansetron, oxyCODONE  Assessment/Plan: C. difficile colitis, patient on Flagyl and vancomycin. Still having loose stool, no emesis. According to her daughter she's not eating much. Patient will abdominal distention , we will check x-ray to rule out ileus. Continue supportive measures Acute on chronic kidney disease Hypertension, coronary artery disease, history of diastolic dysfunction  LOS: 3 days   Rahcel Shutes D 09/19/2013, 10:14 AM

## 2013-09-19 NOTE — Clinical Social Work Note (Signed)
CSW continues to follow this patient. CSW met with patient and daughter Laura Zuniga) who was present at bedside. Per daughter, she has 2 sisters who have not seen the bed offers. Laura Zuniga stated sister [name](POA) will be available to discuss bed offers with she and her other sister tomorrow and then make a decision on a SNF. Laura Zuniga reported family preference was for [facility], but family was told [facility] did not contract with patient's insurance. CSW to follow-up with family tomorrow.  [place], [place] Weekend Clinical Social Worker [phone]

## 2013-09-19 NOTE — Progress Notes (Signed)
Assessment:  1 AKI due to relative hypotension in the aged kidney, urinary retention, dehydration  2 C. Diff colitis  3 ?CKD  4 Relative hypotension Plan:  1 Supportive Rx  Subjective: Interval History:   Objective: Vital signs in last 24 hours: Temp:  [97.6 F (36.4 C)-98.3 F (36.8 C)] 97.6 F (36.4 C) (06/13 5277) Pulse Rate:  [62-70] 62 (06/13 0637) Resp:  [17-18] 17 (06/13 0637) BP: (104-120)/(63) 104/63 mmHg (06/13 0637) SpO2:  [98 %] 98 % (06/13 0637) Weight:  [74.8 kg (164 lb 14.5 oz)] 74.8 kg (164 lb 14.5 oz) (06/13 8242) Weight change:   Intake/Output from previous day: 06/12 0701 - 06/13 0700 In: 1760 [I.V.:1360; IV Piggyback:400] Out: 300 [Urine:300] Intake/Output this shift: Total I/O In: 1562.5 [I.V.:1562.5] Out: -   General appearance: alert and cooperative Head: Normocephalic, without obvious abnormality, atraumatic GI: distendeded and tympanitic Extremities: edema 1-2+  Lab Results:  Recent Labs  09/18/13 0517 09/19/13 0500  WBC 17.1* 18.5*  HGB 9.9* 10.3*  HCT 29.0* 30.4*  PLT 214 247   BMET:  Recent Labs  09/18/13 0517 09/19/13 0500  NA 132* 130*  K 3.4* 4.4  CL 95* 94*  CO2 22 22  GLUCOSE 111* 107*  BUN 24* 31*  CREATININE 1.99* 2.31*  CALCIUM 8.1* 8.4   No results found for this basename: PTH,  in the last 72 hours Iron Studies: No results found for this basename: IRON, TIBC, TRANSFERRIN, FERRITIN,  in the last 72 hours Studies/Results: Ct Abdomen Pelvis Wo Contrast  09/18/2013   CLINICAL DATA:  C difficile colitis, continued abdominal pain  EXAM: CT ABDOMEN AND PELVIS WITHOUT CONTRAST  TECHNIQUE: Multidetector CT imaging of the abdomen and pelvis was performed following the standard protocol without IV contrast. Sagittal and coronal MPR images reconstructed from axial data set. Oral contrast present in GI tract.  COMPARISON:  09/16/2013  FINDINGS: Small bibasilar pleural effusions and atelectasis.  Small RIGHT renal cysts.   Scattered atherosclerotic calcification.  Within limitations of a nonenhanced exam, no other focal abnormalities of the liver, spleen, pancreas, kidneys, or adrenal glands.  Persistent wall thickening from distal transverse colon through rectum compatible with colitis.  Mild patchy wall thickening of the cecum.  Scattered pericolic infiltration particularly at the sigmoid colon.  Small amount of free fluid in pelvis and perihepatic.  Thickening of the lateral conal fascia bilaterally.  Stomach and small bowel loops unremarkable.  Suspect sigmoid diverticulosis.  Bladder decompressed by Foley catheter.  No mass, free air, or hernia.  Question enlarged RIGHT cardiophrenic angle lymph node 7 mm short axis image 9 versus small amount of anterior mediastinal fluid/ edema, not definitely seen on the 09/16/2013 exam.  IMPRESSION: Diffuse colitis as above similar distribution to previous study.  Slightly increased pericolic inflammation and newly identified small amount of ascites.  No evidence of bowel obstruction or perforation.   Electronically Signed   By: Lavonia Dana M.D.   On: 09/18/2013 12:33   US Renal  09/18/2013   CLINICAL DATA:  Renal failure.  Urinary tract infection.  EXAM: RENAL/URINARY TRACT ULTRASOUND COMPLETE  COMPARISON:  CT of the abdomen and pelvis on 09/18/2013  FINDINGS: Right Kidney:  Length: 9.6 cm. Simple cysts are identified in the midpole region measuring 1.9 by 1.9 x 2.0 cm and 1.6 x 1.6 x 1.8 cm.  Left Kidney:  Length: 10.7 cm. Echogenicity within normal limits. No mass or hydronephrosis visualized.  Bladder:  The bladder is decompressed by a Foley.  IMPRESSION: 1. Right renal cyst. 2. No hydronephrosis. 3. Foley catheter.   Electronically Signed   By: Shon Hale M.D.   On: 09/18/2013 15:41   Scheduled: . antiseptic oral rinse  15 mL Mouth Rinse q12n4p  . chlorhexidine  15 mL Mouth Rinse BID  . clopidogrel  75 mg Oral Daily  . cyanocobalamin  500 mcg Oral Daily  . escitalopram  5 mg  Oral Daily  . ferrous sulfate  325 mg Oral Q breakfast  . isosorbide mononitrate  60 mg Oral Daily  . metronidazole  500 mg Intravenous Q8H  . multivitamin with minerals  1 tablet Oral Daily  . pantoprazole  40 mg Oral Daily  . saccharomyces boulardii  250 mg Oral BID  . simvastatin  20 mg Oral q1800  . vancomycin  125 mg Oral 4 times per day  . vitamin C  500 mg Oral Daily  . vitamin E  400 Units Oral Daily     LOS: 3 days   Laura Zuniga C 09/19/2013,1:36 PM

## 2013-09-19 NOTE — Progress Notes (Signed)
Patient ID: JULANNE SCHLUETER, female   DOB: 1918/03/14, 80 y.o.   MRN: [id number] [facility] Gastroenterology Progress Note  VEE BAHE 80 y.o. 02/29/1918   Subjective: Continues to have diarrhea. Black diarrhea reported in chart. Complains of abdominal pain. Lying down in chair. Daughter in room. Daughter reports her coughing up bloody phlegm yesterday but none today.  Objective: Vital signs in last 24 hours: Filed Vitals:   09/19/13 0637  BP: 104/63  Pulse: 62  Temp: 97.6 F (36.4 C)  Resp: 17    Physical Exam: Gen: elderly, frail, no acute distress Abd: diffuse tender with guarding, distended, +BS  Lab Results:  Recent Labs  09/16/13 1858  09/18/13 0517 09/19/13 0500  NA  --   < > 132* 130*  K  --   < > 3.4* 4.4  CL  --   < > 95* 94*  CO2  --   < > 22 22  GLUCOSE  --   < > 111* 107*  BUN  --   < > 24* 31*  CREATININE  --   < > 1.99* 2.31*  CALCIUM  --   < > 8.1* 8.4  MG 1.6  --   --   --   PHOS 2.7  --   --   --   < > = values in this interval not displayed.  Recent Labs  09/16/13 1304 09/17/13 0844  AST 17 18  ALT 10 9  ALKPHOS 85 71  BILITOT 0.5 0.5  PROT 6.2 5.9*  ALBUMIN 2.8* 2.7*    Recent Labs  09/16/13 1304  09/18/13 0517 09/19/13 0500  WBC 13.3*  < > 17.1* 18.5*  NEUTROABS 10.4*  --   --   --   HGB 10.5*  < > 9.9* 10.3*  HCT 31.0*  < > 29.0* 30.4*  MCV 95.4  < > 95.7 94.4  PLT 197  < > 214 247  < > = values in this interval not displayed. No results found for this basename: LABPROT, INR,  in the last 72 hours    Assessment/Plan: 80 yo with C. Diff colitis on PO Vanco and IV Flagyl. Leucocytosis likely due to colitis. Hgb stable. Add Florastor. Continue IVFs, clears without advancing at this time. Supportive care. Defer leaking IV per daughter to primary team and nurse aware. Will follow.   [place] C. 09/19/2013, 10:29 AM

## 2013-09-20 ENCOUNTER — Telehealth: Payer: Self-pay | Admitting: Physician Assistant

## 2013-09-20 ENCOUNTER — Inpatient Hospital Stay (HOSPITAL_COMMUNITY): Payer: Medicare HMO

## 2013-09-20 LAB — BASIC METABOLIC PANEL
BUN: 35 mg/dL — ABNORMAL HIGH (ref 6–23)
CALCIUM: 8.1 mg/dL — AB (ref 8.4–10.5)
CO2: 18 mEq/L — ABNORMAL LOW (ref 19–32)
CREATININE: 2.36 mg/dL — AB (ref 0.50–1.10)
Chloride: 96 mEq/L (ref 96–112)
GFR calc non Af Amer: 17 mL/min — ABNORMAL LOW (ref >90)
GFR, EST AFRICAN AMERICAN: 19 mL/min — AB (ref >90)
Glucose, Bld: 97 mg/dL (ref 70–99)
Potassium: 3.2 mEq/L — ABNORMAL LOW (ref 3.7–5.3)
Sodium: 130 mEq/L — ABNORMAL LOW (ref 137–147)

## 2013-09-20 LAB — CBC
HCT: 31.4 % — ABNORMAL LOW (ref 36.0–46.0)
Hemoglobin: 11.2 g/dL — ABNORMAL LOW (ref 12.0–15.0)
MCH: 33.3 pg (ref 26.0–34.0)
MCHC: 35.7 g/dL (ref 30.0–36.0)
MCV: 93.5 fL (ref 78.0–100.0)
Platelets: 274 10*3/uL (ref 150–400)
RBC: 3.36 MIL/uL — ABNORMAL LOW (ref 3.87–5.11)
RDW: 12.5 % (ref 11.5–15.5)
WBC: 18.1 10*3/uL — AB (ref 4.0–10.5)

## 2013-09-20 MED ORDER — CIPROFLOXACIN IN D5W 400 MG/200ML IV SOLN
400.0000 mg | INTRAVENOUS | Status: DC
  Administered 2013-09-20 – 2013-09-29 (×9): 400 mg via INTRAVENOUS
  Filled 2013-09-20 (×11): qty 200

## 2013-09-20 MED ORDER — IOHEXOL 300 MG/ML  SOLN
25.0000 mL | INTRAMUSCULAR | Status: AC
  Administered 2013-09-20 (×2): 25 mL via ORAL

## 2013-09-20 MED ORDER — POTASSIUM CHLORIDE CRYS ER 20 MEQ PO TBCR
40.0000 meq | EXTENDED_RELEASE_TABLET | Freq: Once | ORAL | Status: AC
  Administered 2013-09-20: 40 meq via ORAL
  Filled 2013-09-20: qty 2

## 2013-09-20 MED ORDER — FUROSEMIDE 10 MG/ML IJ SOLN
80.0000 mg | Freq: Once | INTRAMUSCULAR | Status: AC
  Administered 2013-09-20: 80 mg via INTRAVENOUS
  Filled 2013-09-20: qty 8

## 2013-09-20 NOTE — Progress Notes (Signed)
Subjective: Patient believes her diarrhea is decreasing. She states she had 2 loose episodes yesterday. She states her abdomen is less tender. Less distended. X-ray did confirm mild ileus.  Objective: Vital signs in last 24 hours: Temp:  [97.5 F (36.4 C)-97.8 F (36.6 C)] 97.6 F (36.4 C) (06/14 0557) Pulse Rate:  [61-62] 62 (06/14 0557) Resp:  [18] 18 (06/14 0557) BP: (98-112)/(50-63) 105/63 mmHg (06/14 0557) SpO2:  [100 %] 100 % (06/14 0557) Weight:  [74.8 kg (164 lb 14.5 oz)] 74.8 kg (164 lb 14.5 oz) (06/14 0557) Weight change: 0 kg (0 lb) Last BM Date: 09/20/13  Intake/Output from previous day: 06/13 0701 - 06/14 0700 In: 3496.7 [P.O.:480; I.V.:2916.7; IV Piggyback:100] Out: 400 [Urine:400] Intake/Output this shift:    General appearance: alert and cooperative Resp: clear to auscultation bilaterally Cardio: regular rate and rhythm GI: Abdomen protuberant, tympanic, high-pitched bowel sounds Extremities: PAS hose in place, no edema noted  Lab Results:  No results found for this or any previous visit (from the past 24 hour(s)).    Studies/Results: Ct Abdomen Pelvis Wo Contrast  09/18/2013   CLINICAL DATA:  C difficile colitis, continued abdominal pain  EXAM: CT ABDOMEN AND PELVIS WITHOUT CONTRAST  TECHNIQUE: Multidetector CT imaging of the abdomen and pelvis was performed following the standard protocol without IV contrast. Sagittal and coronal MPR images reconstructed from axial data set. Oral contrast present in GI tract.  COMPARISON:  09/16/2013  FINDINGS: Small bibasilar pleural effusions and atelectasis.  Small RIGHT renal cysts.  Scattered atherosclerotic calcification.  Within limitations of a nonenhanced exam, no other focal abnormalities of the liver, spleen, pancreas, kidneys, or adrenal glands.  Persistent wall thickening from distal transverse colon through rectum compatible with colitis.  Mild patchy wall thickening of the cecum.  Scattered pericolic infiltration  particularly at the sigmoid colon.  Small amount of free fluid in pelvis and perihepatic.  Thickening of the lateral conal fascia bilaterally.  Stomach and small bowel loops unremarkable.  Suspect sigmoid diverticulosis.  Bladder decompressed by Foley catheter.  No mass, free air, or hernia.  Question enlarged RIGHT cardiophrenic angle lymph node 7 mm short axis image 9 versus small amount of anterior mediastinal fluid/ edema, not definitely seen on the 09/16/2013 exam.  IMPRESSION: Diffuse colitis as above similar distribution to previous study.  Slightly increased pericolic inflammation and newly identified small amount of ascites.  No evidence of bowel obstruction or perforation.   Electronically Signed   By: Lavonia Dana M.D.   On: 09/18/2013 12:33   US Renal  09/18/2013   CLINICAL DATA:  Renal failure.  Urinary tract infection.  EXAM: RENAL/URINARY TRACT ULTRASOUND COMPLETE  COMPARISON:  CT of the abdomen and pelvis on 09/18/2013  FINDINGS: Right Kidney:  Length: 9.6 cm. Simple cysts are identified in the midpole region measuring 1.9 by 1.9 x 2.0 cm and 1.6 x 1.6 x 1.8 cm.  Left Kidney:  Length: 10.7 cm. Echogenicity within normal limits. No mass or hydronephrosis visualized.  Bladder:  The bladder is decompressed by a Foley.  IMPRESSION: 1. Right renal cyst. 2. No hydronephrosis. 3. Foley catheter.   Electronically Signed   By: Shon Hale M.D.   On: 09/18/2013 15:41   Dg Abd 2 Views  09/20/2013   CLINICAL DATA:  Abdominal pain.  Question of ileus.  EXAM: ABDOMEN - 2 VIEW  COMPARISON:  Abdominal radiograph performed 09/16/2013, and CT of the abdomen and pelvis performed 09/18/2013  FINDINGS: There is distention of the ascending  and transverse colon, with residual contrast seen along the ascending colon. This raises question for mild ileus, likely secondary to known colitis. Associated air-fluid levels are seen within the colon on the decubitus view. There is no definite evidence for distal obstruction. No  small bowel dilatation is seen. No free intra-abdominal air is identified.  Clips are noted within the right upper quadrant, reflecting prior cholecystectomy. No acute osseous abnormalities are identified.  IMPRESSION: Distention of the ascending and transverse colon, with residual contrast along the ascending colon. The appearance is concerning for mild ileus, likely secondary to known colitis. Associated air-fluid levels seen within the colon. No definite evidence for distal obstruction.   Electronically Signed   By: Garald Balding M.D.   On: 09/20/2013 04:37    Medications:  Prior to Admission:  Prescriptions prior to admission  Medication Sig Dispense Refill  . acetaminophen (TYLENOL) 500 MG tablet Take 500-1,000 mg by mouth every 6 (six) hours as needed for moderate pain or headache.      . calcium-vitamin D (OSCAL WITH D) 500-200 MG-UNIT per tablet Take 1 tablet by mouth daily.      . ciprofloxacin (CIPRO) 500 MG tablet Take 500 mg by mouth 2 (two) times daily.      . clopidogrel (PLAVIX) 75 MG tablet Take 1 tablet (75 mg total) by mouth daily.  30 tablet  5  . Cranberry 250 MG CAPS Take 1 capsule by mouth daily.      . cyanocobalamin 500 MCG tablet Take 500 mcg by mouth daily.      Marland Kitchen escitalopram (LEXAPRO) 10 MG tablet Take 5 mg by mouth daily.      . ferrous sulfate 325 (65 FE) MG tablet Take 325 mg by mouth daily with breakfast.      . furosemide (LASIX) 40 MG tablet Take 40-80 mg by mouth See admin instructions. Take 2 tablet in the morning and take 1 tablets at 3pm.  Pt may take 1-40mg  tablet extra in the evening if needed      . isosorbide mononitrate (IMDUR) 60 MG 24 hr tablet 1 tablet by mouth daily  30 tablet  2  . loperamide (IMODIUM) 1 MG/5ML solution Take 3 mg by mouth as needed for diarrhea or loose stools.      . meclizine (ANTIVERT) 25 MG tablet Take 25 mg by mouth 2 (two) times daily as needed for dizziness.       . metroNIDAZOLE (FLAGYL) 500 MG tablet Take 500 mg by mouth 2  (two) times daily.      . Multiple Vitamin (MULITIVITAMIN WITH MINERALS) TABS Take 1 tablet by mouth daily.      . Multiple Vitamins-Minerals (ICAPS) CAPS Take 1 capsule by mouth 2 (two) times daily.      . pantoprazole (PROTONIX) 40 MG tablet Take 40 mg by mouth daily.      . polyethylene glycol (MIRALAX / GLYCOLAX) packet Take 17 g by mouth daily as needed for mild constipation.       . pravastatin (PRAVACHOL) 40 MG tablet Take 40 mg by mouth daily.      Marland Kitchen Spacer/Aero-Holding Chambers (AEROCHAMBER PLUS FLO-VU) MISC 1 each as needed.       . valsartan (DIOVAN) 160 MG tablet Take 1 tablet (160 mg total) by mouth daily.  30 tablet  4  . vitamin C (ASCORBIC ACID) 500 MG tablet Take 500 mg by mouth daily.      . vitamin E 400 UNIT capsule Take 400  Units by mouth daily.       Scheduled: . antiseptic oral rinse  15 mL Mouth Rinse q12n4p  . chlorhexidine  15 mL Mouth Rinse BID  . clopidogrel  75 mg Oral Daily  . cyanocobalamin  500 mcg Oral Daily  . escitalopram  5 mg Oral Daily  . ferrous sulfate  325 mg Oral Q breakfast  . isosorbide mononitrate  60 mg Oral Daily  . metronidazole  500 mg Intravenous Q8H  . multivitamin with minerals  1 tablet Oral Daily  . pantoprazole  40 mg Oral Daily  . saccharomyces boulardii  250 mg Oral BID  . simvastatin  20 mg Oral q1800  . vancomycin  125 mg Oral 4 times per day  . vitamin C  500 mg Oral Daily  . vitamin E  400 Units Oral Daily   Continuous: . sodium chloride 125 mL/hr at 09/20/13 0021   PTW:[id number], acetaminophen, albuterol, guaiFENesin, hyoscyamine, morphine injection, ondansetron (ZOFRAN) IV, ondansetron, oxyCODONE  Assessment/Plan: C. difficile colitis, patient on Flagyl and vancomycin. Still having loose stool but decreased, no emesis. X-ray confirms mild ileus. Continue supportive measures, check followup labs CBC, BMET Acute on chronic kidney disease  Mild hyponatremia check followup labs Hypertension, coronary artery disease,  history of diastolic dysfunction    LOS: 4 days   Thurlow Gallaga D 09/20/2013, 9:00 AM

## 2013-09-20 NOTE — Progress Notes (Signed)
Pt starting to show some non-pitting edema in hands, and upper airway expiratory wheezes. Tongue is very red.

## 2013-09-20 NOTE — Telephone Encounter (Signed)
[name] is a 80 y.o. female with a hx of diastolic CHF.  She is currently admitted to [facility] with CDiff colitis.  Her daughter called requesting that cardiology come see the patient.  I have asked her daughter [name]) to request that the attending call for a cardiology consult.  She was quite upset that she could not request a cardiology consult herself and I spent a good amount of time trying to explain to her how a consult is obtained.  I have encouraged her repeatedly to request a consult from the attending physician.  She requests that Dr. Casandra Doffing only come see her and no other cardiologist.  I have tried to explain to her that it is hard to know if Dr. Irish Lack would be the rounding cardiologist.  I will forward this note to Dr. Irish Lack in case he can make a social visit. Richardson Dopp, PA-C   09/20/2013 5:20 PM

## 2013-09-20 NOTE — Progress Notes (Signed)
Assessment:  1 AKI due to relative hypotension in the aged kidney, urinary retention, dehydration  2 C. Diff colitis  3 ?CKD  4 Relative hypotension 5 Hypokalemia  6 Vol xs Plan:  1 Stop IVFs 2 Furosemide x 1 3 CXR 4 K replace  Subjective: Interval History: Adb pain with movement, dyspnea and audible wheezes  Objective: Vital signs in last 24 hours: Temp:  [97.6 F (36.4 C)-97.8 F (36.6 C)] 97.6 F (36.4 C) (06/14 0557) Pulse Rate:  [61-62] 62 (06/14 0557) Resp:  [18] 18 (06/14 0557) BP: (105-121)/(63-69) 121/69 mmHg (06/14 1029) SpO2:  [100 %] 100 % (06/14 0557) Weight:  [74.8 kg (164 lb 14.5 oz)] 74.8 kg (164 lb 14.5 oz) (06/14 0557) Weight change: 0 kg (0 lb)  Intake/Output from previous day: 06/13 0701 - 06/14 0700 In: 3496.7 [P.O.:480; I.V.:2916.7; IV Piggyback:100] Out: 400 [Urine:400] Intake/Output this shift: Total I/O In: 1115 [P.O.:240; I.V.:875] Out: -   General appearance: alert and cooperative GI: distended and mild tender Extremities: edema 1+ BLE, LUE 1-2+ ?infiltrate  Lab Results:  Recent Labs  09/19/13 0500 09/20/13 0935  WBC 18.5* 18.1*  HGB 10.3* 11.2*  HCT 30.4* 31.4*  PLT 247 274   BMET:  Recent Labs  09/19/13 0500 09/20/13 0935  NA 130* 130*  K 4.4 3.2*  CL 94* 96  CO2 22 18*  GLUCOSE 107* 97  BUN 31* 35*  CREATININE 2.31* 2.36*  CALCIUM 8.4 8.1*   No results found for this basename: PTH,  in the last 72 hours Iron Studies: No results found for this basename: IRON, TIBC, TRANSFERRIN, FERRITIN,  in the last 72 hours Studies/Results: US Renal  09/18/2013   CLINICAL DATA:  Renal failure.  Urinary tract infection.  EXAM: RENAL/URINARY TRACT ULTRASOUND COMPLETE  COMPARISON:  CT of the abdomen and pelvis on 09/18/2013  FINDINGS: Right Kidney:  Length: 9.6 cm. Simple cysts are identified in the midpole region measuring 1.9 by 1.9 x 2.0 cm and 1.6 x 1.6 x 1.8 cm.  Left Kidney:  Length: 10.7 cm. Echogenicity within normal limits. No  mass or hydronephrosis visualized.  Bladder:  The bladder is decompressed by a Foley.  IMPRESSION: 1. Right renal cyst. 2. No hydronephrosis. 3. Foley catheter.   Electronically Signed   By: Shon Hale M.D.   On: 09/18/2013 15:41   Dg Abd 2 Views  09/20/2013   CLINICAL DATA:  Abdominal pain.  Question of ileus.  EXAM: ABDOMEN - 2 VIEW  COMPARISON:  Abdominal radiograph performed 09/16/2013, and CT of the abdomen and pelvis performed 09/18/2013  FINDINGS: There is distention of the ascending and transverse colon, with residual contrast seen along the ascending colon. This raises question for mild ileus, likely secondary to known colitis. Associated air-fluid levels are seen within the colon on the decubitus view. There is no definite evidence for distal obstruction. No small bowel dilatation is seen. No free intra-abdominal air is identified.  Clips are noted within the right upper quadrant, reflecting prior cholecystectomy. No acute osseous abnormalities are identified.  IMPRESSION: Distention of the ascending and transverse colon, with residual contrast along the ascending colon. The appearance is concerning for mild ileus, likely secondary to known colitis. Associated air-fluid levels seen within the colon. No definite evidence for distal obstruction.   Electronically Signed   By: Garald Balding M.D.   On: 09/20/2013 04:37     LOS: 4 days   Kalie Cabral C 09/20/2013,3:05 PM

## 2013-09-20 NOTE — Progress Notes (Signed)
Patient ID: Laura Zuniga, female   DOB: 11/13/1917, 80 y.o.   MRN: [id number] [facility] Gastroenterology Progress Note  Laura Zuniga 80 y.o. 1918/04/03   Subjective: Black loose stools. Abdominal pain. Daughter from [place] at bedside. Swelling of hands.  Objective: Vital signs in last 24 hours: Filed Vitals:   09/20/13 1029  BP: 121/69  Pulse: 62  Temp: 97.6  Resp: 18    Physical Exam: Gen: elderly, frail, mild acute distress Abd: increased distention, diffusely tender with guarding, decreased bowel sounds Ext: edema in hands noted, no LE edema  Lab Results:  Recent Labs  09/19/13 0500 09/20/13 0935  NA 130* 130*  K 4.4 3.2*  CL 94* 96  CO2 22 18*  GLUCOSE 107* 97  BUN 31* 35*  CREATININE 2.31* 2.36*  CALCIUM 8.4 8.1*   No results found for this basename: AST, ALT, ALKPHOS, BILITOT, PROT, ALBUMIN,  in the last 72 hours  Recent Labs  09/19/13 0500 09/20/13 0935  WBC 18.5* 18.1*  HGB 10.3* 11.2*  HCT 30.4* 31.4*  MCV 94.4 93.5  PLT 247 274   No results found for this basename: LABPROT, INR,  in the last 72 hours    Assessment/Plan: 80 yo with C. Diff colitis and Shigella on GI pathogen panel. On IV Flagyl and PO Vanco. Florastor. Continues to have diarrhea and black appearance could be due to worsening colitis. Hgb stable and doubt upper GI source. Xray from today noted. Will do CT without IV contrast due to worsening leucocytosis and worsening distention to evaluate for perforation or obstruction. May need a flex sig and EGD if black stools continue. Add Cipro IV to abx regimen for the Shigella although suspect the C. Diff is the main source of her diarrhea. Change to NPO except sips with meds and ice chips. Supportive care. Answered daughter's questions.   [place] C. 09/20/2013, 12:55 PM

## 2013-09-20 NOTE — Clinical Social Work Note (Signed)
CSW continues to follow this case CSW met with patient's daughter [name] and discussed d/c plan. Per [name], family has decided to accept bed offer from [facility]. CSW contacted [facility] and made facility aware. CSW to follow-up tomorrow.  [place], [place] Weekend Clinical Social Worker [phone]

## 2013-09-21 DIAGNOSIS — N170 Acute kidney failure with tubular necrosis: Secondary | ICD-10-CM

## 2013-09-21 LAB — RENAL FUNCTION PANEL
ALBUMIN: 2 g/dL — AB (ref 3.5–5.2)
BUN: 35 mg/dL — AB (ref 6–23)
CO2: 18 mEq/L — ABNORMAL LOW (ref 19–32)
CREATININE: 1.98 mg/dL — AB (ref 0.50–1.10)
Calcium: 8 mg/dL — ABNORMAL LOW (ref 8.4–10.5)
Chloride: 96 mEq/L (ref 96–112)
GFR calc Af Amer: 24 mL/min — ABNORMAL LOW (ref >90)
GFR calc non Af Amer: 20 mL/min — ABNORMAL LOW (ref >90)
Glucose, Bld: 87 mg/dL (ref 70–99)
POTASSIUM: 3.6 meq/L — AB (ref 3.7–5.3)
Phosphorus: 3.8 mg/dL (ref 2.3–4.6)
Sodium: 131 mEq/L — ABNORMAL LOW (ref 137–147)

## 2013-09-21 LAB — CBC
HCT: 28 % — ABNORMAL LOW (ref 36.0–46.0)
Hemoglobin: 9.8 g/dL — ABNORMAL LOW (ref 12.0–15.0)
MCH: 32.6 pg (ref 26.0–34.0)
MCHC: 35 g/dL (ref 30.0–36.0)
MCV: 93 fL (ref 78.0–100.0)
PLATELETS: 263 10*3/uL (ref 150–400)
RBC: 3.01 MIL/uL — ABNORMAL LOW (ref 3.87–5.11)
RDW: 12.5 % (ref 11.5–15.5)
WBC: 17.6 10*3/uL — ABNORMAL HIGH (ref 4.0–10.5)

## 2013-09-21 MED ORDER — ALBUTEROL SULFATE (2.5 MG/3ML) 0.083% IN NEBU
2.5000 mg | INHALATION_SOLUTION | Freq: Three times a day (TID) | RESPIRATORY_TRACT | Status: DC
  Administered 2013-09-21 – 2013-09-25 (×13): 2.5 mg via RESPIRATORY_TRACT
  Filled 2013-09-21 (×13): qty 3

## 2013-09-21 MED ORDER — FIDAXOMICIN 200 MG PO TABS
200.0000 mg | ORAL_TABLET | Freq: Two times a day (BID) | ORAL | Status: DC
  Administered 2013-09-21 – 2013-09-30 (×19): 200 mg via ORAL
  Filled 2013-09-21 (×20): qty 1

## 2013-09-21 MED ORDER — PANTOPRAZOLE SODIUM 40 MG PO TBEC
40.0000 mg | DELAYED_RELEASE_TABLET | Freq: Two times a day (BID) | ORAL | Status: DC
  Administered 2013-09-21 – 2013-09-30 (×18): 40 mg via ORAL
  Filled 2013-09-21 (×19): qty 1

## 2013-09-21 MED ORDER — FUROSEMIDE 10 MG/ML IJ SOLN: 80.0000 mg | Freq: Once | INTRAMUSCULAR | Status: DC

## 2013-09-21 MED ORDER — SODIUM BICARBONATE 650 MG PO TABS
650.0000 mg | ORAL_TABLET | Freq: Three times a day (TID) | ORAL | Status: DC
  Administered 2013-09-21 – 2013-09-28 (×21): 650 mg via ORAL
  Filled 2013-09-21 (×27): qty 1

## 2013-09-21 MED ORDER — SODIUM CHLORIDE 0.9 % IV SOLN
INTRAVENOUS | Status: DC
  Administered 2013-09-21: 23:00:00 via INTRAVENOUS
  Administered 2013-09-21 – 2013-09-22 (×2): 10 mL/h via INTRAVENOUS
  Administered 2013-09-26: 1000 mL via INTRAVENOUS

## 2013-09-21 NOTE — Progress Notes (Signed)
IV team paged about gaining IV access in patient, IV team nurse to be 2nd to assess.

## 2013-09-21 NOTE — Progress Notes (Signed)
A "jellybean" resembling substance was in pt's last bowel movement. It was pink, squishy, and was white on the inside. Pt stated that she had not had anything other than clear liquids til she was made NPO. RN left in a specimen cup in biohazard bag in pt room for MDs to assess just in case.

## 2013-09-21 NOTE — Progress Notes (Signed)
Subjective: C/o loose stool Belly pain  Objective: Vital signs in last 24 hours: Temp:  [97.6 F (36.4 C)-98.2 F (36.8 C)] 98.2 F (36.8 C) (06/15 0430) Pulse Rate:  [66-79] 79 (06/15 0430) Resp:  [16-18] 16 (06/15 0430) BP: (113-133)/(61-69) 133/69 mmHg (06/15 0430) SpO2:  [98 %] 98 % (06/15 0430) Weight:  [74.3 kg (163 lb 12.8 oz)] 74.3 kg (163 lb 12.8 oz) (06/15 0637) Weight change: -0.5 kg (-1 lb 1.6 oz) Last BM Date: 09/20/13  Intake/Output from previous day: 06/14 0701 - 06/15 0700 In: 1615 [P.O.:240; I.V.:875; IV Piggyback:500] Out: 7035 [Urine:1550] Intake/Output this shift:    General appearance: alert Resp: wheezes bibasilar Cardio: regular rate and rhythm GI: soft BSpresent, diffuse tender. mild distended  Lab Results:  Recent Labs  09/20/13 0935 09/21/13 0611  WBC 18.1* 17.6*  HGB 11.2* 9.8*  HCT 31.4* 28.0*  PLT 274 263   BMET  Recent Labs  09/20/13 0935 09/21/13 0611  NA 130* 131*  K 3.2* 3.6*  CL 96 96  CO2 18* 18*  GLUCOSE 97 87  BUN 35* 35*  CREATININE 2.36* 1.98*  CALCIUM 8.1* 8.0*    Studies/Results: Ct Abdomen Pelvis Wo Contrast  09/20/2013   CLINICAL DATA:  Abdominal pain. C difficile and she alae colitis. Black stools. Ileus on radiograph. Perforation. Colonic obstruction.  EXAM: CT ABDOMEN AND PELVIS WITHOUT CONTRAST  TECHNIQUE: Multidetector CT imaging of the abdomen and pelvis was performed following the standard protocol without IV contrast.  COMPARISON:  CT 09/18/2013.  FINDINGS: Bones: Lumbar spondylosis, scoliosis and facet arthrosis. No aggressive osseous lesions.  Lung Bases: Increasing small bilateral dependently layering pleural effusions. Old granulomatous disease. Compressive atelectasis in the lower lobes. 9 mm nodular density in the right middle lobe is unchanged compared to recent prior. Large pericardial fat pad. Diaphragmatic hernia is present with less fluid in the anterior pre pericardial herniated fat. This  probably represents herniated omentum.  Liver: Unenhanced CT was performed per clinician order. Lack of IV contrast limits sensitivity and specificity, especially for evaluation of abdominal/pelvic solid viscera. Grossly normal.  Spleen:  Normal.  Gallbladder:  Surgically absent.  Common bile duct:  Normal.  Pancreas:  Normal.  Adrenal glands:  Normal bilaterally.  Kidneys: There is high density of the kidneys, related to prior contrast injection and suggesting contrast induced nephropathy. The kidneys demonstrate Double the Hounsfield unit attenuation of the intravascular compartment. No excreted contrast is present in the collecting systems. No hydronephrosis. Right renal cysts.  Stomach:  Small hiatal hernia.  No inflammatory changes of stomach.  Small bowel: Normal appearance of the duodenum. No small bowel obstruction.  Colon: Pan colitis is present. There is no intra-abdominal free air or perforation. Pericolonic inflammatory changes. The distal colon is more decompressed than on the prior exam. Diverticulosis is again noted. Pericolonic ascites. The volume of ascites is small.  Pelvic Genitourinary: Hysterectomy. Urinary bladder decompressed with Foley catheter.  Vasculature: Atherosclerosis.  Body Wall: Anasarca.  Fat containing periumbilical hernia.  IMPRESSION: 1. Pan colitis.  No perforation or abscess. 2. High-density of the kidneys. This may be related to contrast induced nephropathy from CT 09/16/2012. 3. Small bilateral pleural effusions with associated atelectasis. 4. 9 mm right middle lobe subpleural pulmonary nodule. Two to three-month noncontrast chest CT recommended to assess for stability. Based on the sessile appearance, this probably represents a subpleural lymph node. 5. Anasarca.  Small volume ascites.   Electronically Signed   By: Dereck Ligas M.D.   On:  09/20/2013 19:55   Dg Chest Port 1 View  09/20/2013   CLINICAL DATA:  Difficulty breathing.  EXAM: PORTABLE CHEST - 1 VIEW   COMPARISON:  Abdominal series 6/10 2015.  FINDINGS: Mediastinum and hilar structures are normal. Bibasilar atelectasis and/or pneumonia noted. Small left pleural effusion. Cardiomegaly we normal pulmonary vascularity. No acute bony abnormality . Surgical clips right upper quadrant.  IMPRESSION: 1. Bibasilar atelectasis and/or pneumonia with small left pleural effusion.  2.  Cardiomegaly.  Normal pulmonary vascularity.   Electronically Signed   By: Marcello Moores  Register   On: 09/20/2013 16:10   Dg Abd 2 Views  09/20/2013   CLINICAL DATA:  Abdominal pain.  Question of ileus.  EXAM: ABDOMEN - 2 VIEW  COMPARISON:  Abdominal radiograph performed 09/16/2013, and CT of the abdomen and pelvis performed 09/18/2013  FINDINGS: There is distention of the ascending and transverse colon, with residual contrast seen along the ascending colon. This raises question for mild ileus, likely secondary to known colitis. Associated air-fluid levels are seen within the colon on the decubitus view. There is no definite evidence for distal obstruction. No small bowel dilatation is seen. No free intra-abdominal air is identified.  Clips are noted within the right upper quadrant, reflecting prior cholecystectomy. No acute osseous abnormalities are identified.  IMPRESSION: Distention of the ascending and transverse colon, with residual contrast along the ascending colon. The appearance is concerning for mild ileus, likely secondary to known colitis. Associated air-fluid levels seen within the colon. No definite evidence for distal obstruction.   Electronically Signed   By: Garald Balding M.D.   On: 09/20/2013 04:37    Medications: I have reviewed the patient's current medications.  Assessment/Plan: C. difficile colitis, patient on Flagyl and vancomycin. Still having loose stool but decreased, no emesis. X-ray confirms mild ileus.- CT scan repeat- ok- pancolitis- cipro added Continue supportive measures, check followup labs CBC, BMET   Atelectasis-  On chest xray- incentive spirometry COPD- neb tx. Acute on chronic kidney disease - per renal-  Cr better- lasix given- ? Restart Lasix or prn- will defer to renal Mild hyponatremia check stable. Hypertension, coronary artery disease, history of diastolic dysfunction-  Hold ARB due to ARF   LOS: 5 days   Laura Zuniga 09/21/2013, 7:29 AM

## 2013-09-21 NOTE — Progress Notes (Addendum)
Patient ID: Laura Zuniga, female   DOB: 11-02-1917, 80 y.o.   MRN: [id number] [facility] Gastroenterology Progress Note  Laura Zuniga 80 y.o. 07-Dec-1917   Subjective: Sitting in chair. Feels sore. Had black loose stool overnight and small fecal looking ball with red coating in collection jar from stool episode last night.  Objective: Vital signs in last 24 hours: Filed Vitals:   09/21/13 0913  BP: 133/69  Pulse: 68  Temp: 98.2  Resp: 16    Physical Exam: Gen: elderly, frail, alert, no acute distress Abd: diffusely tender with guarding, mild distention, +BS  Lab Results:  Recent Labs  09/20/13 0935 09/21/13 0611  NA 130* 131*  K 3.2* 3.6*  CL 96 96  CO2 18* 18*  GLUCOSE 97 87  BUN 35* 35*  CREATININE 2.36* 1.98*  CALCIUM 8.1* 8.0*  PHOS  --  3.8    Recent Labs  09/21/13 0611  ALBUMIN 2.0*    Recent Labs  09/20/13 0935 09/21/13 0611  WBC 18.1* 17.6*  HGB 11.2* 9.8*  HCT 31.4* 28.0*  MCV 93.5 93.0  PLT 274 263   No results found for this basename: LABPROT, INR,  in the last 72 hours    Assessment/Plan: 80 yo with severe C. Diff colitis and Shigella colitis with persisting leucocytosis and recurrent bloody (black and red) stools despite IV Flagyl, IV Cipro, and PO Vancomycin. CT consistent with pancolitis without obstruction or perforation. Will change to Dificid due to her severe C. Diff and failure to respond to PO Vanco. Will plan to do unprepped flex sig and EGD tomorrow to further evaluate her black stools. Will check for ischemic colitis although bleeding likely due to severe pseudomembranous colitis.   Laura C. 09/21/2013, 11:02 AM

## 2013-09-21 NOTE — Progress Notes (Signed)
Pt's daughter reported to RN that the pt's IV site was hurting and swelling up. RN assessed IV site to be infiltrated with IV cipro infusion. Area of redness and swelling around and above the right arm AC measured 12cm width by 20cm height. Ice pack applied and extremity was elevated. IV team called to start a new site.

## 2013-09-21 NOTE — Care Management Note (Signed)
    Page 1 of 1   09/24/2013     3:36:31 PM CARE MANAGEMENT NOTE 09/24/2013  Patient:  Laura Zuniga, Laura Zuniga   Account Number:  [id number]  Date Initiated:  09/21/2013  Documentation initiated by:  Tomi Bamberger  Subjective/Objective Assessment:   dx c diff  admit- lives alone.     Action/Plan:   pt eval rec snf  xray- mild ileus   Anticipated DC Date:  09/25/2013   Anticipated DC Plan:  SKILLED NURSING FACILITY  In-house referral  Clinical Social Worker      DC Planning Services  CM consult      Choice offered to / List presented to:             Status of service:  In process, will continue to follow Medicare Important Message given?  YES (If response is "NO", the following Medicare IM given date fields will be blank) Date Medicare IM given:  09/21/2013 Date Additional Medicare IM given:  09/24/2013  Discharge Disposition:    Per UR Regulation:  Reviewed for med. necessity/level of care/duration of stay  If discussed at [place] of Stay Meetings, dates discussed:    Comments:  09/24/13 9:58 Tomi Bamberger RN, BSN [phone] per MD still advancing diet , wants to see if patient tolerates soft diet today, and plan for dc tomorrow.  09/21/13 [place], BSN [phone] patient conts with loose stool , failure to respond to po vanc, plan for a flex sig and egd 6/16.

## 2013-09-21 NOTE — Progress Notes (Signed)
Assessment:  80 year old female with background of HTN, diastolic CHF, CKD stage 3 at baseline, HLD, diverticular disease and  GERD. Admitted with abdominal pain, diarrhea, fever and chills  CT with left sided colitis,with subsequent dx of CDiff, and Shigella .  Admission creatinine of 1.29 rose to a hIGH of 2.36 and we were asked to see.  Pt developed wheezing after IVF hydration. We were asked to see.   1 AKI due to relative hypotension in the aged kidney, urinary retention, dehydration - creatinine is starting to improve. Non-oliguric. Mild acidosis. Add some oral bicarb. 2 C. Diff colitis/+stool for Shigella - continued abd pain; gi has changed to dificid in addition to iv cipro and flagyl 3 Baseline CKD 3 4 Relative hypotension - soft BP's have improved 5 Hypokalemia - repleted 6 Vol xs - good uop with lasix yesterday 80 IV - with continued wheezing + some LE edema will repeat dose today  Subjective: Interval History:  Abd feels a little better "not so tight" Still a little short of breath despite O2  Objective: Vital signs in last 24 hours: Temp:  [97.6 F (36.4 C)-98.2 F (36.8 C)] 98.2 F (36.8 C) (06/15 0430) Pulse Rate:  [66-79] 68 (06/15 0913) Resp:  [16-18] 16 (06/15 0913) BP: (113-133)/(61-69) 133/69 mmHg (06/15 0430) SpO2:  [98 %] 98 % (06/15 0913) Weight:  [74.3 kg (163 lb 12.8 oz)] 74.3 kg (163 lb 12.8 oz) (06/15 0637) Weight change: -0.5 kg (-1 lb 1.6 oz)  Intake/Output from previous day: 06/14 0701 - 06/15 0700 In: 1615 [P.O.:240; I.V.:875; IV Piggyback:500] Out: 9147 [Urine:1550] Intake/Output this shift:   PHYSICAL EXAMINATION General appearance: alert and cooperative BP 133/69  Pulse 68  Temp(Src) 98.2 F (36.8 C) (Axillary)  Resp 16  Ht 5\' 1"  (1.549 m)  Wt 74.3 kg (163 lb 12.8 oz)  BMI 30.97 kg/m2  SpO2 98% Lungs with exp wheezes, few base crackles GI: + bs, soft, mild tenderness lower quadrants, no rebound Extremities: edema 1+ BLE  persistent Foley in place clear urine   Recent Labs  09/20/13 0935 09/21/13 0611  WBC 18.1* 17.6*  HGB 11.2* 9.8*  HCT 31.4* 28.0*  PLT 274 263   BMET:   Recent Labs  09/20/13 0935 09/21/13 0611  NA 130* 131*  K 3.2* 3.6*  CL 96 96  CO2 18* 18*  GLUCOSE 97 87  BUN 35* 35*  CREATININE 2.36* 1.98*  CALCIUM 8.1* 8.0*   . albuterol  2.5 mg Nebulization TID  . antiseptic oral rinse  15 mL Mouth Rinse q12n4p  . chlorhexidine  15 mL Mouth Rinse BID  . ciprofloxacin  400 mg Intravenous Q24H  . clopidogrel  75 mg Oral Daily  . cyanocobalamin  500 mcg Oral Daily  . escitalopram  5 mg Oral Daily  . ferrous sulfate  325 mg Oral Q breakfast  . fidaxomicin  200 mg Oral BID  . isosorbide mononitrate  60 mg Oral Daily  . metronidazole  500 mg Intravenous Q8H  . multivitamin with minerals  1 tablet Oral Daily  . pantoprazole  40 mg Oral BID  . saccharomyces boulardii  250 mg Oral BID  . simvastatin  20 mg Oral q1800  . vitamin C  500 mg Oral Daily  . vitamin E  400 Units Oral Daily   . sodium chloride 10 mL/hr (09/21/13 1250)   Studies/Results: Ct Abdomen Pelvis Wo Contrast  09/20/2013   CLINICAL DATA:  Abdominal pain. C difficile and she  alae colitis. Black stools. Ileus on radiograph. Perforation. Colonic obstruction.  EXAM: CT ABDOMEN AND PELVIS WITHOUT CONTRAST  TECHNIQUE: Multidetector CT imaging of the abdomen and pelvis was performed following the standard protocol without IV contrast.  COMPARISON:  CT 09/18/2013.  FINDINGS: Bones: Lumbar spondylosis, scoliosis and facet arthrosis. No aggressive osseous lesions.  Lung Bases: Increasing small bilateral dependently layering pleural effusions. Old granulomatous disease. Compressive atelectasis in the lower lobes. 9 mm nodular density in the right middle lobe is unchanged compared to recent prior. Large pericardial fat pad. Diaphragmatic hernia is present with less fluid in the anterior pre pericardial herniated fat. This probably  represents herniated omentum.  Liver: Unenhanced CT was performed per clinician order. Lack of IV contrast limits sensitivity and specificity, especially for evaluation of abdominal/pelvic solid viscera. Grossly normal.  Spleen:  Normal.  Gallbladder:  Surgically absent.  Common bile duct:  Normal.  Pancreas:  Normal.  Adrenal glands:  Normal bilaterally.  Kidneys: There is high density of the kidneys, related to prior contrast injection and suggesting contrast induced nephropathy. The kidneys demonstrate Double the Hounsfield unit attenuation of the intravascular compartment. No excreted contrast is present in the collecting systems. No hydronephrosis. Right renal cysts.  Stomach:  Small hiatal hernia.  No inflammatory changes of stomach.  Small bowel: Normal appearance of the duodenum. No small bowel obstruction.  Colon: Pan colitis is present. There is no intra-abdominal free air or perforation. Pericolonic inflammatory changes. The distal colon is more decompressed than on the prior exam. Diverticulosis is again noted. Pericolonic ascites. The volume of ascites is small.  Pelvic Genitourinary: Hysterectomy. Urinary bladder decompressed with Foley catheter.  Vasculature: Atherosclerosis.  Body Wall: Anasarca.  Fat containing periumbilical hernia.  IMPRESSION: 1. Pan colitis.  No perforation or abscess. 2. High-density of the kidneys. This may be related to contrast induced nephropathy from CT 09/16/2012. 3. Small bilateral pleural effusions with associated atelectasis. 4. 9 mm right middle lobe subpleural pulmonary nodule. Two to three-month noncontrast chest CT recommended to assess for stability. Based on the sessile appearance, this probably represents a subpleural lymph node. 5. Anasarca.  Small volume ascites.   Electronically Signed   By: Dereck Ligas M.D.   On: 09/20/2013 19:55   Dg Chest Port 1 View  09/20/2013   CLINICAL DATA:  Difficulty breathing.  EXAM: PORTABLE CHEST - 1 VIEW  COMPARISON:   Abdominal series 6/10 2015.  FINDINGS: Mediastinum and hilar structures are normal. Bibasilar atelectasis and/or pneumonia noted. Small left pleural effusion. Cardiomegaly we normal pulmonary vascularity. No acute bony abnormality . Surgical clips right upper quadrant.  IMPRESSION: 1. Bibasilar atelectasis and/or pneumonia with small left pleural effusion.  2.  Cardiomegaly.  Normal pulmonary vascularity.   Electronically Signed   By: Marcello Moores  Register   On: 09/20/2013 16:10   Dg Abd 2 Views  09/20/2013   CLINICAL DATA:  Abdominal pain.  Question of ileus.  EXAM: ABDOMEN - 2 VIEW  COMPARISON:  Abdominal radiograph performed 09/16/2013, and CT of the abdomen and pelvis performed 09/18/2013  FINDINGS: There is distention of the ascending and transverse colon, with residual contrast seen along the ascending colon. This raises question for mild ileus, likely secondary to known colitis. Associated air-fluid levels are seen within the colon on the decubitus view. There is no definite evidence for distal obstruction. No small bowel dilatation is seen. No free intra-abdominal air is identified.  Clips are noted within the right upper quadrant, reflecting prior cholecystectomy. No acute osseous abnormalities  are identified.  IMPRESSION: Distention of the ascending and transverse colon, with residual contrast along the ascending colon. The appearance is concerning for mild ileus, likely secondary to known colitis. Associated air-fluid levels seen within the colon. No definite evidence for distal obstruction.   Electronically Signed   By: Garald Balding M.D.   On: 09/20/2013 04:37     LOS: 5 days   Yanci Bachtell B 09/21/2013,2:37 PM

## 2013-09-21 NOTE — Clinical Documentation Improvement (Signed)
Presents with C Diff Colitis and Shigella; has AKI.   Renal Consult notes "AKI due to relative hypotension, aged kidneys, dehydration"  B/P at time of exam 93/54  Please further clarify if the patient has any other related diagnoses associated with the AKI if indicated. Please document findings in next progress note and discharge summary.  Acute Tubular Necrosis Acute Renal Cortical Necrosis Acute Renal Medullary Necrosis Other Condition Disagree with Query   Thank You, Zoila Shutter ,RN Clinical Documentation Specialist:  [facility] [employer] [facility]

## 2013-09-22 ENCOUNTER — Inpatient Hospital Stay (HOSPITAL_COMMUNITY): Payer: Medicare HMO | Admitting: Certified Registered Nurse Anesthetist

## 2013-09-22 ENCOUNTER — Encounter (HOSPITAL_COMMUNITY): Payer: Self-pay | Admitting: Certified Registered Nurse Anesthetist

## 2013-09-22 ENCOUNTER — Inpatient Hospital Stay (HOSPITAL_COMMUNITY): Payer: Medicare HMO

## 2013-09-22 ENCOUNTER — Encounter (HOSPITAL_COMMUNITY): Payer: Medicare HMO | Admitting: Certified Registered Nurse Anesthetist

## 2013-09-22 ENCOUNTER — Encounter (HOSPITAL_COMMUNITY)
Admission: EM | Disposition: A | Discharge status: Skilled Nursing Facility | Payer: Self-pay | Source: Home / Self Care | Attending: Internal Medicine

## 2013-09-22 DIAGNOSIS — K921 Melena: Secondary | ICD-10-CM | POA: Clinically undetermined

## 2013-09-22 DIAGNOSIS — A0472 Enterocolitis due to Clostridium difficile, not specified as recurrent: Secondary | ICD-10-CM | POA: Clinically undetermined

## 2013-09-22 HISTORY — PX: FLEXIBLE SIGMOIDOSCOPY: SHX5431

## 2013-09-22 HISTORY — PX: ESOPHAGOGASTRODUODENOSCOPY: SHX5428

## 2013-09-22 LAB — CBC
HCT: 28.3 % — ABNORMAL LOW (ref 36.0–46.0)
HEMOGLOBIN: 9.8 g/dL — AB (ref 12.0–15.0)
MCH: 32 pg (ref 26.0–34.0)
MCHC: 34.6 g/dL (ref 30.0–36.0)
MCV: 92.5 fL (ref 78.0–100.0)
Platelets: 287 10*3/uL (ref 150–400)
RBC: 3.06 MIL/uL — AB (ref 3.87–5.11)
RDW: 12.7 % (ref 11.5–15.5)
WBC: 14.6 10*3/uL — ABNORMAL HIGH (ref 4.0–10.5)

## 2013-09-22 LAB — RENAL FUNCTION PANEL
Albumin: 2 g/dL — ABNORMAL LOW (ref 3.5–5.2)
BUN: 31 mg/dL — ABNORMAL HIGH (ref 6–23)
CO2: 18 meq/L — AB (ref 19–32)
Calcium: 8.1 mg/dL — ABNORMAL LOW (ref 8.4–10.5)
Chloride: 100 mEq/L (ref 96–112)
Creatinine, Ser: 1.58 mg/dL — ABNORMAL HIGH (ref 0.50–1.10)
GFR calc non Af Amer: 27 mL/min — ABNORMAL LOW (ref >90)
GFR, EST AFRICAN AMERICAN: 31 mL/min — AB (ref >90)
GLUCOSE: 91 mg/dL (ref 70–99)
POTASSIUM: 3.1 meq/L — AB (ref 3.7–5.3)
Phosphorus: 3.6 mg/dL (ref 2.3–4.6)
Sodium: 133 mEq/L — ABNORMAL LOW (ref 137–147)

## 2013-09-22 SURGERY — SIGMOIDOSCOPY, FLEXIBLE
Anesthesia: Moderate Sedation

## 2013-09-22 SURGERY — EGD (ESOPHAGOGASTRODUODENOSCOPY)
Anesthesia: Monitor Anesthesia Care

## 2013-09-22 MED ORDER — POTASSIUM CHLORIDE CRYS ER 20 MEQ PO TBCR
20.0000 meq | EXTENDED_RELEASE_TABLET | Freq: Two times a day (BID) | ORAL | Status: AC
  Administered 2013-09-22 (×2): 20 meq via ORAL
  Filled 2013-09-22 (×2): qty 1

## 2013-09-22 MED ORDER — SODIUM CHLORIDE 0.9 % IJ SOLN
10.0000 mL | INTRAMUSCULAR | Status: DC | PRN
  Administered 2013-09-22 – 2013-09-26 (×7): 10 mL
  Administered 2013-09-27: 20 mL
  Administered 2013-09-29: 10 mL
  Administered 2013-09-30: 20 mL

## 2013-09-22 MED ORDER — FENTANYL CITRATE 0.05 MG/ML IJ SOLN: 25.0000 ug | INTRAMUSCULAR | Status: DC | PRN

## 2013-09-22 MED ORDER — POTASSIUM CHLORIDE 10 MEQ/100ML IV SOLN
10.0000 meq | INTRAVENOUS | Status: AC
  Administered 2013-09-22 (×3): 10 meq via INTRAVENOUS
  Filled 2013-09-22 (×3): qty 100

## 2013-09-22 MED ORDER — LACTATED RINGERS IV SOLN
INTRAVENOUS | Status: DC | PRN
  Administered 2013-09-22: 12:00:00 via INTRAVENOUS

## 2013-09-22 MED ORDER — PROPOFOL 10 MG/ML IV BOLUS
INTRAVENOUS | Status: DC | PRN
  Administered 2013-09-22 (×5): 10 mg via INTRAVENOUS
  Administered 2013-09-22: 20 mg via INTRAVENOUS

## 2013-09-22 MED ORDER — DROPERIDOL 2.5 MG/ML IJ SOLN: 0.6250 mg | INTRAMUSCULAR | Status: DC | PRN

## 2013-09-22 MED ORDER — SODIUM CHLORIDE 0.9 % IJ SOLN
10.0000 mL | Freq: Two times a day (BID) | INTRAMUSCULAR | Status: DC
  Administered 2013-09-28 (×2): 10 mL

## 2013-09-22 MED ORDER — FUROSEMIDE 10 MG/ML IJ SOLN
80.0000 mg | Freq: Once | INTRAMUSCULAR | Status: AC
  Administered 2013-09-22: 80 mg via INTRAVENOUS
  Filled 2013-09-22: qty 8

## 2013-09-22 NOTE — Progress Notes (Signed)
Lasix dose ordered yesterday was skipped due to loss of IV access and difficulty starting a new IV (by mult tries from IV team). By the time IV was restarted, BP is soft so Dr Deforest Hoyles ordered to hold the lasix dose for now.

## 2013-09-22 NOTE — Progress Notes (Signed)
Assessment:  80 year old female with background of HTN, diastolic CHF, CKD stage 3 at baseline, HLD, diverticular disease and  GERD. Admitted with abdominal pain, diarrhea, fever and chills  CT with left sided colitis,with subsequent dx of CDiff, and Shigella .  Admission creatinine of 1.29 rose to a hIgh of 2.36  Pt developed wheezing after IVF hydration. We were asked to see.   1 AKI due to relative hypotension in the aged kidney, urinary retention, dehydration - creatinine continues to improve. Non-oliguric. Mild acidosis. Added some oral bicarb yesterday. Had dose of lasix ordered that was not given due to loss of IV access, then held d/t soft BP.  Wheezing post endo today - give Lasix 80 IV X 1. Potassium 30 mEq X 2 doses 2 C. Diff colitis/and +stool for Shigella - continued abd pain; gi changed to dificid in addition to iv cipro and flagyl; flex sig today showed tics and pseudomembranes with blood clots 3 Baseline CKD 3 4 Relative hypotension - soft BP's have improved 5 Hypokalemia - repleted 6 Vol xs - good uop with lasix yesterday 80 IV on Sunday; did not get ordered dose yesterday; wheezing now - give today  Subjective: Interval History:  Just back from EGD and flex sig Wheezing and SOB Abd still hurts Says still has diarrhea  Objective: Vital signs in last 24 hours: Temp:  [97.6 F (36.4 C)-98.6 F (37 C)] 98.2 F (36.8 C) (06/16 1456) Pulse Rate:  [65-80] 67 (06/16 1456) Resp:  [16-22] 18 (06/16 1456) BP: (100-145)/(36-76) 145/76 mmHg (06/16 1456) SpO2:  [95 %-100 %] 98 % (06/16 1456) Weight:  [74 kg (163 lb 2.3 oz)] 74 kg (163 lb 2.3 oz) (06/16 0456) Weight change: -0.3 kg (-10.6 oz)  Intake/Output from previous day: 06/15 0701 - 06/16 0700 In: -  Out: 450 [Urine:450] Intake/Output this shift: Total I/O In: 200 [I.V.:200] Out: -  PHYSICAL EXAMINATION BP 145/76  Pulse 67  Temp(Src) 98.2 F (36.8 C) (Oral)  Resp 18  Ht 5\' 1"  (1.549 m)  Wt 74 kg (163 lb 2.3 oz)   BMI 30.84 kg/m2  SpO2 98% General appearance: alert and cooperative; wearing O2; says SOB Lungs with exp wheezes, few base crackles GI: + bs, soft, mild tenderness lower quadrants, no rebound Extremities: edema 1+ BLE persistent and unchanged Foley in place clear urine draining   Recent Labs  09/21/13 0611 09/22/13 0640  WBC 17.6* 14.6*  HGB 9.8* 9.8*  HCT 28.0* 28.3*  PLT 263 287   BMET:   Recent Labs  09/21/13 0611 09/22/13 0640  NA 131* 133*  K 3.6* 3.1*  CL 96 100  CO2 18* 18*  GLUCOSE 87 91  BUN 35* 31*  CREATININE 1.98* 1.58*  CALCIUM 8.0* 8.1*   . albuterol  2.5 mg Nebulization TID  . antiseptic oral rinse  15 mL Mouth Rinse q12n4p  . chlorhexidine  15 mL Mouth Rinse BID  . ciprofloxacin  400 mg Intravenous Q24H  . clopidogrel  75 mg Oral Daily  . cyanocobalamin  500 mcg Oral Daily  . escitalopram  5 mg Oral Daily  . ferrous sulfate  325 mg Oral Q breakfast  . fidaxomicin  200 mg Oral BID  . furosemide  80 mg Intravenous Once  . isosorbide mononitrate  60 mg Oral Daily  . metronidazole  500 mg Intravenous Q8H  . multivitamin with minerals  1 tablet Oral Daily  . pantoprazole  40 mg Oral BID  . saccharomyces boulardii  250 mg Oral BID  . simvastatin  20 mg Oral q1800  . sodium bicarbonate  650 mg Oral TID  . vitamin C  500 mg Oral Daily  . vitamin E  400 Units Oral Daily   . sodium chloride 10 mL/hr at 09/21/13 2249   Studies/Results: Ct Abdomen Pelvis Wo Contrast  09/20/2013   CLINICAL DATA:  Abdominal pain. C difficile and she alae colitis. Black stools. Ileus on radiograph. Perforation. Colonic obstruction.  EXAM: CT ABDOMEN AND PELVIS WITHOUT CONTRAST  TECHNIQUE: Multidetector CT imaging of the abdomen and pelvis was performed following the standard protocol without IV contrast.  COMPARISON:  CT 09/18/2013.  FINDINGS: Bones: Lumbar spondylosis, scoliosis and facet arthrosis. No aggressive osseous lesions.  Lung Bases: Increasing small bilateral  dependently layering pleural effusions. Old granulomatous disease. Compressive atelectasis in the lower lobes. 9 mm nodular density in the right middle lobe is unchanged compared to recent prior. Large pericardial fat pad. Diaphragmatic hernia is present with less fluid in the anterior pre pericardial herniated fat. This probably represents herniated omentum.  Liver: Unenhanced CT was performed per clinician order. Lack of IV contrast limits sensitivity and specificity, especially for evaluation of abdominal/pelvic solid viscera. Grossly normal.  Spleen:  Normal.  Gallbladder:  Surgically absent.  Common bile duct:  Normal.  Pancreas:  Normal.  Adrenal glands:  Normal bilaterally.  Kidneys: There is high density of the kidneys, related to prior contrast injection and suggesting contrast induced nephropathy. The kidneys demonstrate Double the Hounsfield unit attenuation of the intravascular compartment. No excreted contrast is present in the collecting systems. No hydronephrosis. Right renal cysts.  Stomach:  Small hiatal hernia.  No inflammatory changes of stomach.  Small bowel: Normal appearance of the duodenum. No small bowel obstruction.  Colon: Pan colitis is present. There is no intra-abdominal free air or perforation. Pericolonic inflammatory changes. The distal colon is more decompressed than on the prior exam. Diverticulosis is again noted. Pericolonic ascites. The volume of ascites is small.  Pelvic Genitourinary: Hysterectomy. Urinary bladder decompressed with Foley catheter.  Vasculature: Atherosclerosis.  Body Wall: Anasarca.  Fat containing periumbilical hernia.  IMPRESSION: 1. Pan colitis.  No perforation or abscess. 2. High-density of the kidneys. This may be related to contrast induced nephropathy from CT 09/16/2012. 3. Small bilateral pleural effusions with associated atelectasis. 4. 9 mm right middle lobe subpleural pulmonary nodule. Two to three-month noncontrast chest CT recommended to assess for  stability. Based on the sessile appearance, this probably represents a subpleural lymph node. 5. Anasarca.  Small volume ascites.   Electronically Signed   By: Dereck Ligas M.D.   On: 09/20/2013 19:55   Dg Chest Port 1 View  09/20/2013   CLINICAL DATA:  Difficulty breathing.  EXAM: PORTABLE CHEST - 1 VIEW  COMPARISON:  Abdominal series 6/10 2015.  FINDINGS: Mediastinum and hilar structures are normal. Bibasilar atelectasis and/or pneumonia noted. Small left pleural effusion. Cardiomegaly we normal pulmonary vascularity. No acute bony abnormality . Surgical clips right upper quadrant.  IMPRESSION: 1. Bibasilar atelectasis and/or pneumonia with small left pleural effusion.  2.  Cardiomegaly.  Normal pulmonary vascularity.   Electronically Signed   By: Marcello Moores  Register   On: 09/20/2013 16:10     LOS: 6 days   DUNHAM,CYNTHIA B 09/22/2013,3:21 PM

## 2013-09-22 NOTE — Transfer of Care (Signed)
Immediate Anesthesia Transfer of Care Note  Patient: Laura Zuniga  Procedure(s) Performed: Procedure(s): ESOPHAGOGASTRODUODENOSCOPY (EGD) (N/A) FLEXIBLE SIGMOIDOSCOPY (N/A)  Patient Location: PACU and Endoscopy Unit  Anesthesia Type:MAC  Level of Consciousness: alert , oriented, patient cooperative and responds to stimulation  Airway & Oxygen Therapy: Patient Spontanous Breathing and Patient connected to nasal cannula oxygen  Post-op Assessment: Report given to PACU RN and Post -op Vital signs reviewed and stable  Post vital signs: Reviewed and stable  Complications: No apparent anesthesia complications

## 2013-09-22 NOTE — Progress Notes (Signed)
Physical Therapy Treatment Patient Details Name: Laura Zuniga MRN: [id number] DOB: May 17, 1917 Today's Date: 13-Oct-2013    History of Present Illness Adm 6/10 with colitis, Cdiff PMHx- CHF, CAD, COPD, lives alone    PT Comments    Pt moving well but with increased fatigue with gait from last tx. Pt encouraged to be OOB daily and walk in room with staff as well as continue HEP. Dgtr present and educated as well throughout session. Will continue to follow.   Follow Up Recommendations  SNF     Equipment Recommendations       Recommendations for Other Services       Precautions / Restrictions Precautions Precautions: Fall    Mobility  Bed Mobility Overal bed mobility: Needs Assistance   Rolling: Min assist Sidelying to sit: Min assist;HOB elevated       General bed mobility comments: cues for sequence with assist to elevate trunk  Transfers     Transfers: Sit to/from Stand Sit to Stand: Min assist         General transfer comment: cues for hand placement and sequence x 2 trials  Ambulation/Gait Ambulation/Gait assistance: Min assist Ambulation Distance (Feet): 55 Feet Assistive device: Rolling walker (2 wheeled) Gait Pattern/deviations: Step-through pattern;Decreased stride length;Trunk flexed   Gait velocity interpretation: Below normal speed for age/gender General Gait Details: cues for posture, position in RW and direction to room   Stairs            Wheelchair Mobility    Modified Rankin (Stroke Patients Only)       Balance                                    Cognition Arousal/Alertness: Awake/alert Behavior During Therapy: WFL for tasks assessed/performed Overall Cognitive Status: History of cognitive impairments - at baseline                      Exercises General Exercises - Lower Extremity Long Arc Quad: AROM;Seated;Both;15 reps Hip Flexion/Marching: AROM;Seated;Both;15 reps Toe Raises:  AROM;Seated;Both;15 reps Heel Raises: AROM;Seated;Both;15 reps    General Comments        Pertinent Vitals/Pain No pain sats 94-96% on RA    Home Living                      Prior Function            PT Goals (current goals can now be found in the care plan section) Progress towards PT goals: Progressing toward goals    Frequency  Min 3X/week    PT Plan Current plan remains appropriate;Frequency needs to be updated    Co-evaluation             End of Session Equipment Utilized During Treatment: Gait belt Activity Tolerance: Patient limited by fatigue Patient left: in chair;with call bell/phone within reach;with chair alarm set;with family/visitor present     Time: 1696-7893 PT Time Calculation (min): 29 min  Charges:  $Gait Training: 8-22 mins $Therapeutic Activity: 8-22 mins                    G Codes:      Melford Aase 10-13-13, 10:20 AM Elwyn Reach, [place]

## 2013-09-22 NOTE — Anesthesia Procedure Notes (Signed)
Anesthesia Procedure Note CVP: Timeout, sterile prep, drape, FBP R neck.  Trendelenburg position.  1% lido local, finder and trocar RIJ 1st pass with US guidance.  2 lumen placed over J wire. Biopatch and sterile dressing on.  Patient tolerated well.  VSS.  Jenita Seashore, MD  [phone]

## 2013-09-22 NOTE — Progress Notes (Signed)
Subjective: Feel some better Less stool IV access issue  Objective: Vital signs in last 24 hours: Temp:  [97.6 F (36.4 C)-98.1 F (36.7 C)] 97.6 F (36.4 C) (06/16 0456) Pulse Rate:  [65-80] 72 (06/16 0456) Resp:  [16-18] 18 (06/16 0456) BP: (100-110)/(50-58) 100/50 mmHg (06/16 0456) SpO2:  [95 %-98 %] 98 % (06/16 0456) Weight:  [74 kg (163 lb 2.3 oz)] 74 kg (163 lb 2.3 oz) (06/16 0456) Weight change: -0.3 kg (-10.6 oz) Last BM Date: 09/21/13  Intake/Output from previous day: 06/15 0701 - 06/16 0700 In: -  Out: 450 [Urine:450] Intake/Output this shift:    General appearance: alert Resp: wheezes bibasilar Cardio: regular rate and rhythm GI: soft mild tender diffuse, BS present; not distended  Lab Results:  Recent Labs  09/21/13 0611 09/22/13 0640  WBC 17.6* 14.6*  HGB 9.8* 9.8*  HCT 28.0* 28.3*  PLT 263 287   BMET  Recent Labs  09/20/13 0935 09/21/13 0611  NA 130* 131*  K 3.2* 3.6*  CL 96 96  CO2 18* 18*  GLUCOSE 97 87  BUN 35* 35*  CREATININE 2.36* 1.98*  CALCIUM 8.1* 8.0*    Studies/Results: Ct Abdomen Pelvis Wo Contrast  09/20/2013   CLINICAL DATA:  Abdominal pain. C difficile and she alae colitis. Black stools. Ileus on radiograph. Perforation. Colonic obstruction.  EXAM: CT ABDOMEN AND PELVIS WITHOUT CONTRAST  TECHNIQUE: Multidetector CT imaging of the abdomen and pelvis was performed following the standard protocol without IV contrast.  COMPARISON:  CT 09/18/2013.  FINDINGS: Bones: Lumbar spondylosis, scoliosis and facet arthrosis. No aggressive osseous lesions.  Lung Bases: Increasing small bilateral dependently layering pleural effusions. Old granulomatous disease. Compressive atelectasis in the lower lobes. 9 mm nodular density in the right middle lobe is unchanged compared to recent prior. Large pericardial fat pad. Diaphragmatic hernia is present with less fluid in the anterior pre pericardial herniated fat. This probably represents herniated  omentum.  Liver: Unenhanced CT was performed per clinician order. Lack of IV contrast limits sensitivity and specificity, especially for evaluation of abdominal/pelvic solid viscera. Grossly normal.  Spleen:  Normal.  Gallbladder:  Surgically absent.  Common bile duct:  Normal.  Pancreas:  Normal.  Adrenal glands:  Normal bilaterally.  Kidneys: There is high density of the kidneys, related to prior contrast injection and suggesting contrast induced nephropathy. The kidneys demonstrate Double the Hounsfield unit attenuation of the intravascular compartment. No excreted contrast is present in the collecting systems. No hydronephrosis. Right renal cysts.  Stomach:  Small hiatal hernia.  No inflammatory changes of stomach.  Small bowel: Normal appearance of the duodenum. No small bowel obstruction.  Colon: Pan colitis is present. There is no intra-abdominal free air or perforation. Pericolonic inflammatory changes. The distal colon is more decompressed than on the prior exam. Diverticulosis is again noted. Pericolonic ascites. The volume of ascites is small.  Pelvic Genitourinary: Hysterectomy. Urinary bladder decompressed with Foley catheter.  Vasculature: Atherosclerosis.  Body Wall: Anasarca.  Fat containing periumbilical hernia.  IMPRESSION: 1. Pan colitis.  No perforation or abscess. 2. High-density of the kidneys. This may be related to contrast induced nephropathy from CT 09/16/2012. 3. Small bilateral pleural effusions with associated atelectasis. 4. 9 mm right middle lobe subpleural pulmonary nodule. Two to three-month noncontrast chest CT recommended to assess for stability. Based on the sessile appearance, this probably represents a subpleural lymph node. 5. Anasarca.  Small volume ascites.   Electronically Signed   By: Arvilla Market.D.  On: 09/20/2013 19:55   Dg Chest Port 1 View  09/20/2013   CLINICAL DATA:  Difficulty breathing.  EXAM: PORTABLE CHEST - 1 VIEW  COMPARISON:  Abdominal series 6/10  2015.  FINDINGS: Mediastinum and hilar structures are normal. Bibasilar atelectasis and/or pneumonia noted. Small left pleural effusion. Cardiomegaly we normal pulmonary vascularity. No acute bony abnormality . Surgical clips right upper quadrant.  IMPRESSION: 1. Bibasilar atelectasis and/or pneumonia with small left pleural effusion.  2.  Cardiomegaly.  Normal pulmonary vascularity.   Electronically Signed   By: Marcello Moores  Register   On: 09/20/2013 16:10    Medications: I have reviewed the patient's current medications.  Assessment/Plan: C. difficile colitis, Shigella- Pancolitis on CT- patient on Flagyl and vancomycin. Flex sig per GI today- continue Clear diet Picc line for IV access Atelectasis- On chest xray- incentive spirometry  COPD- neb tx.  Acute on chronic kidney disease/ ATN - per renal- Cr better-  will defer to renal  Mild hyponatremia check stable.  Hypertension, coronary artery disease, history of diastolic dysfunction- Hold ARB due to ARF Weakness/FTT: PT/OT SNF placement   LOS: 6 days   Yamilee Harmes 09/22/2013, 7:31 AM

## 2013-09-22 NOTE — Progress Notes (Signed)
Pt. Is ok for PICC line per verbal order from Dr. Lysle Rubens.

## 2013-09-22 NOTE — Op Note (Signed)
[facility] [place], [id number]   FLEXIBLE SIGMOIDOSCOPY PROCEDURE REPORT  PATIENT: Laura Zuniga, Laura Zuniga  MR#: [id number] BIRTHDATE: [birth year] , 89  yrs. old GENDER: Female ENDOSCOPIST: Wilford Corner, MD REFERRED BY:  hospital team PROCEDURE DATE:  09/22/2013 PROCEDURE:     Flexible sigmoidoscopy with biopsies ASA CLASS:    Class III INDICATIONS: Diarrhea; Melena MEDICATIONS:  See Anesthesia report  DESCRIPTION OF PROCEDURE:   Semi-liquid brown stool noted at rectum; Nonthrombosed external hemorrhoids noted; A pediatric colonoscope was inserted into an unprepped colon and advanced to the distal transverse colon. Starting at 40 cm pseudomembranes were noted along with patchy ulcers with blood and a few maroon clots seen. During withdrawal the pseudomembranes were more diffuse in the descending and proximal sigmoid colon. Biopsies taken to check for ischemic colitis in addition to pseudomembranous colitis. Sigmoid diverticuli noted. Retroflexion not attempted due to a small rectal vault but small internal hemorrhoids noted.        COMPLICATIONS:  None  ENDOSCOPIC IMPRESSION: 1. Pseudomembranous colitis with blood clots and blood seen - s/p biopsies to rule out ischemic colitis 2. Sigmoid diverticulosis 3. Internal and external hemorrhoids  RECOMMENDATIONS:  Continue Dificid and IV Flagyl. Continue Florastor. Supportive care. F/U on path. Slowly advance diet as tolerated.   _______________________________ Lorrin MaisWilford Corner, MD 09/22/2013 1:29 PM

## 2013-09-22 NOTE — Interval H&P Note (Signed)
History and Physical Interval Note:  09/22/2013 11:09 AM  Laura Zuniga  has presented today for surgery, with the diagnosis of diarrhea  The various methods of treatment have been discussed with the patient and family. After consideration of risks, benefits and other options for treatment, the patient has consented to  Procedure(s): ESOPHAGOGASTRODUODENOSCOPY (EGD) (N/A) FLEXIBLE SIGMOIDOSCOPY (N/A) as a surgical intervention .  The patient's history has been reviewed, patient examined, no change in status, stable for surgery.  I have reviewed the patient's chart and labs.  Questions were answered to the patient's satisfaction.     Canon C.

## 2013-09-22 NOTE — Anesthesia Preprocedure Evaluation (Addendum)
Anesthesia Evaluation  Patient identified by MRN, date of birth, ID band Patient awake    Reviewed: Allergy & Precautions, H&P , NPO status , Patient's Chart, lab work & pertinent test results  History of Anesthesia Complications Negative for: history of anesthetic complications  Airway Mallampati: II TM Distance: >3 FB Neck ROM: Full    Dental  (+) Missing, Partial Upper, Partial Lower, Dental Advisory Given   Pulmonary shortness of breath, COPD COPD inhaler,      rales    Cardiovascular hypertension, Pt. on medications + CAD and +CHF Rhythm:Regular Rate:Normal     Neuro/Psych    GI/Hepatic Neg liver ROS, GERD-  Medicated and Poorly Controlled,  Endo/Other  negative endocrine ROS  Renal/GU Renal InsufficiencyRenal disease     Musculoskeletal   Abdominal (+) + obese,   Peds  Hematology negative hematology ROS (+)   Anesthesia Other Findings   Reproductive/Obstetrics                          Anesthesia Physical Anesthesia Plan  ASA: III  Anesthesia Plan: MAC   Post-op Pain Management:    Induction:   Airway Management Planned: Nasal Cannula and Natural Airway  Additional Equipment: CVP  Intra-op Plan:   Post-operative Plan:   Informed Consent: I have reviewed the patients History and Physical, chart, labs and discussed the procedure including the risks, benefits and alternatives for the proposed anesthesia with the patient or authorized representative who has indicated his/her understanding and acceptance.   Dental advisory given  Plan Discussed with: CRNA and Surgeon  Anesthesia Plan Comments: (Plan routine monitors, central IV access, MAC bilat arms with prior IV infiltration, poor IV access)        Anesthesia Quick Evaluation

## 2013-09-22 NOTE — H&P (View-Only) (Signed)
Subjective: Feel some better Less stool IV access issue  Objective: Vital signs in last 24 hours: Temp:  [97.6 F (36.4 C)-98.1 F (36.7 C)] 97.6 F (36.4 C) (06/16 0456) Pulse Rate:  [65-80] 72 (06/16 0456) Resp:  [16-18] 18 (06/16 0456) BP: (100-110)/(50-58) 100/50 mmHg (06/16 0456) SpO2:  [95 %-98 %] 98 % (06/16 0456) Weight:  [74 kg (163 lb 2.3 oz)] 74 kg (163 lb 2.3 oz) (06/16 0456) Weight change: -0.3 kg (-10.6 oz) Last BM Date: 09/21/13  Intake/Output from previous day: 06/15 0701 - 06/16 0700 In: -  Out: 450 [Urine:450] Intake/Output this shift:    General appearance: alert Resp: wheezes bibasilar Cardio: regular rate and rhythm GI: soft mild tender diffuse, BS present; not distended  Lab Results:  Recent Labs  09/21/13 0611 09/22/13 0640  WBC 17.6* 14.6*  HGB 9.8* 9.8*  HCT 28.0* 28.3*  PLT 263 287   BMET  Recent Labs  09/20/13 0935 09/21/13 0611  NA 130* 131*  K 3.2* 3.6*  CL 96 96  CO2 18* 18*  GLUCOSE 97 87  BUN 35* 35*  CREATININE 2.36* 1.98*  CALCIUM 8.1* 8.0*    Studies/Results: Ct Abdomen Pelvis Wo Contrast  09/20/2013   CLINICAL DATA:  Abdominal pain. C difficile and she alae colitis. Black stools. Ileus on radiograph. Perforation. Colonic obstruction.  EXAM: CT ABDOMEN AND PELVIS WITHOUT CONTRAST  TECHNIQUE: Multidetector CT imaging of the abdomen and pelvis was performed following the standard protocol without IV contrast.  COMPARISON:  CT 09/18/2013.  FINDINGS: Bones: Lumbar spondylosis, scoliosis and facet arthrosis. No aggressive osseous lesions.  Lung Bases: Increasing small bilateral dependently layering pleural effusions. Old granulomatous disease. Compressive atelectasis in the lower lobes. 9 mm nodular density in the right middle lobe is unchanged compared to recent prior. Large pericardial fat pad. Diaphragmatic hernia is present with less fluid in the anterior pre pericardial herniated fat. This probably represents herniated  omentum.  Liver: Unenhanced CT was performed per clinician order. Lack of IV contrast limits sensitivity and specificity, especially for evaluation of abdominal/pelvic solid viscera. Grossly normal.  Spleen:  Normal.  Gallbladder:  Surgically absent.  Common bile duct:  Normal.  Pancreas:  Normal.  Adrenal glands:  Normal bilaterally.  Kidneys: There is high density of the kidneys, related to prior contrast injection and suggesting contrast induced nephropathy. The kidneys demonstrate Double the Hounsfield unit attenuation of the intravascular compartment. No excreted contrast is present in the collecting systems. No hydronephrosis. Right renal cysts.  Stomach:  Small hiatal hernia.  No inflammatory changes of stomach.  Small bowel: Normal appearance of the duodenum. No small bowel obstruction.  Colon: Pan colitis is present. There is no intra-abdominal free air or perforation. Pericolonic inflammatory changes. The distal colon is more decompressed than on the prior exam. Diverticulosis is again noted. Pericolonic ascites. The volume of ascites is small.  Pelvic Genitourinary: Hysterectomy. Urinary bladder decompressed with Foley catheter.  Vasculature: Atherosclerosis.  Body Wall: Anasarca.  Fat containing periumbilical hernia.  IMPRESSION: 1. Pan colitis.  No perforation or abscess. 2. High-density of the kidneys. This may be related to contrast induced nephropathy from CT 09/16/2012. 3. Small bilateral pleural effusions with associated atelectasis. 4. 9 mm right middle lobe subpleural pulmonary nodule. Two to three-month noncontrast chest CT recommended to assess for stability. Based on the sessile appearance, this probably represents a subpleural lymph node. 5. Anasarca.  Small volume ascites.   Electronically Signed   By: Arvilla Market.D.  On: 09/20/2013 19:55   Dg Chest Port 1 View  09/20/2013   CLINICAL DATA:  Difficulty breathing.  EXAM: PORTABLE CHEST - 1 VIEW  COMPARISON:  Abdominal series 6/10  2015.  FINDINGS: Mediastinum and hilar structures are normal. Bibasilar atelectasis and/or pneumonia noted. Small left pleural effusion. Cardiomegaly we normal pulmonary vascularity. No acute bony abnormality . Surgical clips right upper quadrant.  IMPRESSION: 1. Bibasilar atelectasis and/or pneumonia with small left pleural effusion.  2.  Cardiomegaly.  Normal pulmonary vascularity.   Electronically Signed   By: Marcello Moores  Register   On: 09/20/2013 16:10    Medications: I have reviewed the patient's current medications.  Assessment/Plan: C. difficile colitis, Shigella- Pancolitis on CT- patient on Flagyl and vancomycin. Flex sig per GI today- continue Clear diet Picc line for IV access Atelectasis- On chest xray- incentive spirometry  COPD- neb tx.  Acute on chronic kidney disease/ ATN - per renal- Cr better-  will defer to renal  Mild hyponatremia check stable.  Hypertension, coronary artery disease, history of diastolic dysfunction- Hold ARB due to ARF Weakness/FTT: PT/OT SNF placement   LOS: 6 days   HUSAIN,KARRAR 09/22/2013, 7:31 AM

## 2013-09-22 NOTE — Anesthesia Postprocedure Evaluation (Signed)
  Anesthesia Post-op Note  Patient: Laura Zuniga  Procedure(s) Performed: Procedure(s): ESOPHAGOGASTRODUODENOSCOPY (EGD) (N/A) FLEXIBLE SIGMOIDOSCOPY (N/A)  Patient Location: Endoscopy Unit  Anesthesia Type:MAC  Level of Consciousness: awake, alert , oriented and patient cooperative  Airway and Oxygen Therapy: Patient Spontanous Breathing and Patient connected to nasal cannula oxygen  Post-op Pain: none  Post-op Assessment: Post-op Vital signs reviewed, Patient's Cardiovascular Status Stable, Respiratory Function Stable, Patent Airway, No signs of Nausea or vomiting and Pain level controlled  Post-op Vital Signs: Reviewed and stable  Last Vitals:  Filed Vitals:   09/22/13 1350  BP: 136/41  Pulse: 72  Temp:   Resp: 21    Complications: No apparent anesthesia complications

## 2013-09-22 NOTE — Op Note (Signed)
[facility] [place], [id number]   ENDOSCOPY PROCEDURE REPORT  PATIENT: Laura Zuniga, Laura Zuniga  MR#: [id number] BIRTHDATE: 1917-12-02 , 89  yrs. old GENDER: Female  ENDOSCOPIST: Wilford Corner, MD REFERRED JJ:HERDEYCX team  PROCEDURE DATE:  09/22/2013 PROCEDURE:   EGD w/ biopsy ASA CLASS:   Class III INDICATIONS:Melena. MEDICATIONS: See Anesthesia Report.  TOPICAL ANESTHETIC:  DESCRIPTION OF PROCEDURE:   After the risks benefits and alternatives of the procedure were thoroughly explained, informed consent was obtained.  The PENTAX GASTOROSCOPE [id number]  endoscope was introduced through the mouth and advanced to the second portion of the duodenum , limited by Without limitations.   The instrument was slowly withdrawn as the mucosa was fully examined.     FINDINGS: The endoscope was inserted into the oropharynx and esophagus was intubated.  The gastroesophageal junction was noted to be 38 cm from the incisors and a widely patent distal esophageal ring was noted. The esophagus was otherwise normal in appearance. Endoscope was advanced into the stomach, which revealed circumferential edema and stenosis at the pyloric channel. An area of nodularity also seen. With mild resistance the endoscope was advanced into the duodenal bulb and second portion of duodenum which were unremarkable.  The endoscope was withdrawn back into the stomach and biopsies were done of the prepyloric area and pyloric channel to send for histology. Retroflexion revealed a small hiatal hernia.  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

## 2013-09-23 ENCOUNTER — Encounter (HOSPITAL_COMMUNITY): Payer: Self-pay | Admitting: Gastroenterology

## 2013-09-23 LAB — CBC
HEMATOCRIT: 26.8 % — AB (ref 36.0–46.0)
HEMOGLOBIN: 9.3 g/dL — AB (ref 12.0–15.0)
MCH: 32.5 pg (ref 26.0–34.0)
MCHC: 34.7 g/dL (ref 30.0–36.0)
MCV: 93.7 fL (ref 78.0–100.0)
Platelets: 314 10*3/uL (ref 150–400)
RBC: 2.86 MIL/uL — ABNORMAL LOW (ref 3.87–5.11)
RDW: 12.8 % (ref 11.5–15.5)
WBC: 11.5 10*3/uL — AB (ref 4.0–10.5)

## 2013-09-23 LAB — RENAL FUNCTION PANEL
ALBUMIN: 2.1 g/dL — AB (ref 3.5–5.2)
BUN: 23 mg/dL (ref 6–23)
CO2: 21 mEq/L (ref 19–32)
CREATININE: 1.09 mg/dL (ref 0.50–1.10)
Calcium: 8 mg/dL — ABNORMAL LOW (ref 8.4–10.5)
Chloride: 104 mEq/L (ref 96–112)
GFR calc Af Amer: 49 mL/min — ABNORMAL LOW (ref >90)
GFR calc non Af Amer: 42 mL/min — ABNORMAL LOW (ref >90)
GLUCOSE: 84 mg/dL (ref 70–99)
POTASSIUM: 3.4 meq/L — AB (ref 3.7–5.3)
Phosphorus: 2.7 mg/dL (ref 2.3–4.6)
Sodium: 137 mEq/L (ref 137–147)

## 2013-09-23 MED ORDER — POTASSIUM CHLORIDE CRYS ER 20 MEQ PO TBCR
20.0000 meq | EXTENDED_RELEASE_TABLET | Freq: Once | ORAL | Status: AC
  Administered 2013-09-23: 20 meq via ORAL
  Filled 2013-09-23: qty 1

## 2013-09-23 NOTE — Clinical Social Work Note (Signed)
CSW spoke with patient's daughter [name] and explained that MD expects that patient will be ready for discharge on Friday. Patient has a bed at [facility].   Liz Beach MSW, [place], [place], [phone]

## 2013-09-23 NOTE — Progress Notes (Signed)
Subjective: Pt feel better Less abdominal pain  Objective: Vital signs in last 24 hours: Temp:  [97.6 F (36.4 C)-98.6 F (37 C)] 97.9 F (36.6 C) (06/17 0437) Pulse Rate:  [67-73] 72 (06/17 0437) Resp:  [16-22] 18 (06/17 0437) BP: (104-146)/(36-76) 104/56 mmHg (06/17 0437) SpO2:  [96 %-100 %] 99 % (06/17 0437) Weight:  [72.9 kg (160 lb 11.5 oz)] 72.9 kg (160 lb 11.5 oz) (06/17 0437) Weight change: -1.1 kg (-2 lb 6.8 oz) Last BM Date: 09/21/13  Intake/Output from previous day: 06/16 0701 - 06/17 0700 In: 320 [P.O.:120; I.V.:200] Out: 1700 [Urine:1700] Intake/Output this shift:    General appearance: alert Resp: diminished breath sounds bibasilar Cardio: regular rate and rhythm GI: soft mild tender, BS present  Lab Results:  Recent Labs  09/22/13 0640 09/23/13 0500  WBC 14.6* 11.5*  HGB 9.8* 9.3*  HCT 28.3* 26.8*  PLT 287 314   BMET  Recent Labs  09/22/13 0640 09/23/13 0510  NA 133* 137  K 3.1* 3.4*  CL 100 104  CO2 18* 21  GLUCOSE 91 84  BUN 31* 23  CREATININE 1.58* 1.09  CALCIUM 8.1* 8.0*    Studies/Results: Dg Chest Port 1 View  09/22/2013   CLINICAL DATA:  Right IJ line placement.  EXAM: PORTABLE CHEST - 1 VIEW  COMPARISON:  09/20/2013  FINDINGS: There has been interval placement of a right jugular central venous catheter with tip overlying the mid to lower SVC. Cardiac silhouette remains mildly enlarged, unchanged. Thoracic aortic calcification is noted. The lungs remain hypoinflated with bibasilar parenchymal lung opacities, slightly improved on the right. No pneumothorax is identified. Small left pleural effusion is unchanged. Surgical clips are noted in the right upper abdomen. No acute osseous abnormality is identified.  IMPRESSION: 1. Right jugular catheter placement with tip overlying the mid to lower SVC. 2. Hypoinflation with slightly improved aeration of the right lung base. Persistent bibasilar lung opacities may reflect atelectasis.    Electronically Signed   By: Logan Bores   On: 09/22/2013 16:17    Medications: I have reviewed the patient's current medications.  Assessment/Plan: C. difficile colitis, Shigella- Pancolitis on CT- patient on Flagyl and cipro,dificid  Flex sig /EGD results noted- infectionous colitis/ BX done; EGD negative- advance diet. ? Change to PO cipro and flagyl. Atelectasis- On chest xray- incentive spirometry  COPD- neb tx.  Acute on chronic kidney disease/ ATN- improved - per renal- Cr normal today. Mild hyponatremia corrected/ hypokalemia- replace K Hypertension, coronary artery disease, history of diastolic dysfunction- Hold ARB due to ARF  Weakness/FTT: PT/OT  SNF placement- in next 1-2 days   LOS: 7 days   HUSAIN,KARRAR 09/23/2013, 7:17 AM

## 2013-09-23 NOTE — Progress Notes (Signed)
Assessment:  80 year old female with background of HTN, diastolic CHF, CKD stage 3 at baseline, HLD, diverticular disease and  GERD. Admitted with abdominal pain, diarrhea, fever and chills  CT with left sided colitis,with subsequent dx of CDiff, and Shigella .  Admission creatinine of 1.29 rose to a hIgh of 2.36  Pt developed wheezing after IVF hydration. We were asked to see.   1 AKI due to relative hypotension in the aged kidney, urinary retention, dehydration - probable mild ATN. Has nearly completely resolved. Good UOP. Can use lasix prn  2 C. Diff colitis/and +stool for Shigella - continued abd pain; flex sig showed tics and pseudomembranes; cipro/flagyl and dificid 3 Baseline CKD 3 - improved to better than admission values. 4 Relative hypotension - improved 5 Hypokalemia - repleted by primary service 6 Vol xs - good uop with lasix yesterday 80 IV and improved wheezing  At this time, renal services no longer needed with marked improvement in renal function.  Defer further management to Dr. Lysle Rubens. Will sign off. Should not need renal ofice followup.    Subjective: Interval History:  Feeling better Breathing better and abd sx better  Objective: Vital signs in last 24 hours: Temp:  [97.6 F (36.4 C)-98.6 F (37 C)] 97.9 F (36.6 C) (06/17 0437) Pulse Rate:  [67-73] 72 (06/17 0437) Resp:  [16-22] 18 (06/17 0437) BP: (104-146)/(36-76) 104/56 mmHg (06/17 0437) SpO2:  [96 %-100 %] 97 % (06/17 0903) Weight:  [72.9 kg (160 lb 11.5 oz)] 72.9 kg (160 lb 11.5 oz) (06/17 0437) Weight change: -1.1 kg (-2 lb 6.8 oz)  Intake/Output from previous day: 06/16 0701 - 06/17 0700 In: 320 [P.O.:120; I.V.:200] Out: 1700 [Urine:1700] Intake/Output this shift:   PHYSICAL EXAMINATION BP 104/56  Pulse 72  Temp(Src) 97.9 F (36.6 C) (Oral)  Resp 18  Ht 5\' 1"  (1.549 m)  Wt 72.9 kg (160 lb 11.5 oz)  BMI 30.38 kg/m2  SpO2 97% General appearance: alert and cooperative; wearing O2; says  SOB Lungs diminshed at bases GI: + bs, soft, mild tenderness lower quadrants, no rebound Extremities: edema trace bilat   Recent Labs  09/22/13 0640 09/23/13 0500  WBC 14.6* 11.5*  HGB 9.8* 9.3*  HCT 28.3* 26.8*  PLT 287 314    Recent Labs  09/22/13 0640 09/23/13 0510  NA 133* 137  K 3.1* 3.4*  CL 100 104  CO2 18* 21  GLUCOSE 91 84  BUN 31* 23  CREATININE 1.58* 1.09  CALCIUM 8.1* 8.0*   . albuterol  2.5 mg Nebulization TID  . antiseptic oral rinse  15 mL Mouth Rinse q12n4p  . chlorhexidine  15 mL Mouth Rinse BID  . ciprofloxacin  400 mg Intravenous Q24H  . clopidogrel  75 mg Oral Daily  . cyanocobalamin  500 mcg Oral Daily  . escitalopram  5 mg Oral Daily  . ferrous sulfate  325 mg Oral Q breakfast  . fidaxomicin  200 mg Oral BID  . isosorbide mononitrate  60 mg Oral Daily  . metronidazole  500 mg Intravenous Q8H  . multivitamin with minerals  1 tablet Oral Daily  . pantoprazole  40 mg Oral BID  . saccharomyces boulardii  250 mg Oral BID  . simvastatin  20 mg Oral q1800  . sodium bicarbonate  650 mg Oral TID  . sodium chloride  10-40 mL Intracatheter Q12H  . vitamin C  500 mg Oral Daily  . vitamin E  400 Units Oral Daily   .  sodium chloride 10 mL/hr (09/22/13 1958)   Studies/Results: Dg Chest Port 1 View  09/22/2013   CLINICAL DATA:  Right IJ line placement.  EXAM: PORTABLE CHEST - 1 VIEW  COMPARISON:  09/20/2013  FINDINGS: There has been interval placement of a right jugular central venous catheter with tip overlying the mid to lower SVC. Cardiac silhouette remains mildly enlarged, unchanged. Thoracic aortic calcification is noted. The lungs remain hypoinflated with bibasilar parenchymal lung opacities, slightly improved on the right. No pneumothorax is identified. Small left pleural effusion is unchanged. Surgical clips are noted in the right upper abdomen. No acute osseous abnormality is identified.  IMPRESSION: 1. Right jugular catheter placement with tip  overlying the mid to lower SVC. 2. Hypoinflation with slightly improved aeration of the right lung base. Persistent bibasilar lung opacities may reflect atelectasis.   Electronically Signed   By: Logan Bores   On: 09/22/2013 16:17     LOS: 7 days   DUNHAM,CYNTHIA B 09/23/2013,12:37 PM

## 2013-09-23 NOTE — Progress Notes (Signed)
Patient ID: Laura Zuniga, female   DOB: 07/18/1917, 80 y.o.   MRN: [id number] [facility] Gastroenterology Progress Note  Laura Zuniga 80 y.o. 20-Feb-1918   Subjective: Sitting in chair. Reports abdominal pain. Loose stools continuing.  Objective: Vital signs in last 24 hours: Filed Vitals:   09/23/13 0437  BP: 104/56  Pulse: 72  Temp: 97.9 F (36.6 C)  Resp: 18    Physical Exam: Gen: alert, no acute distress Abd: distended, diffusely tender (less tender compared with yesterday), +BS  Lab Results:  Recent Labs  09/22/13 0640 09/23/13 0510  NA 133* 137  K 3.1* 3.4*  CL 100 104  CO2 18* 21  GLUCOSE 91 84  BUN 31* 23  CREATININE 1.58* 1.09  CALCIUM 8.1* 8.0*  PHOS 3.6 2.7    Recent Labs  09/22/13 0640 09/23/13 0510  ALBUMIN 2.0* 2.1*    Recent Labs  09/22/13 0640 09/23/13 0500  WBC 14.6* 11.5*  HGB 9.8* 9.3*  HCT 28.3* 26.8*  MCV 92.5 93.7  PLT 287 314   No results found for this basename: LABPROT, INR,  in the last 72 hours    Assessment/Plan: Pseudomembranous colitis - biopsies c/w pseudomembranous colitis. No ischemia seen on biopsies. Stomach bx benign. Agree with full liquid and can advance tomorrow if ok. D/W Dr. Lysle Rubens to continue IV Flagyl until d/c and then stop. Change Cipro to PO prior to discharge to complete at 10 day course. Continue Dificid to complete a 10 day course. Will follow.   [place] C. 09/23/2013, 2:20 PM

## 2013-09-24 LAB — BASIC METABOLIC PANEL
BUN: 15 mg/dL (ref 6–23)
CO2: 23 meq/L (ref 19–32)
Calcium: 8.2 mg/dL — ABNORMAL LOW (ref 8.4–10.5)
Chloride: 102 mEq/L (ref 96–112)
Creatinine, Ser: 0.84 mg/dL (ref 0.50–1.10)
GFR calc Af Amer: 67 mL/min — ABNORMAL LOW (ref >90)
GFR, EST NON AFRICAN AMERICAN: 58 mL/min — AB (ref >90)
GLUCOSE: 116 mg/dL — AB (ref 70–99)
POTASSIUM: 3.3 meq/L — AB (ref 3.7–5.3)
SODIUM: 136 meq/L — AB (ref 137–147)

## 2013-09-24 MED ORDER — POTASSIUM CHLORIDE CRYS ER 20 MEQ PO TBCR
40.0000 meq | EXTENDED_RELEASE_TABLET | Freq: Once | ORAL | Status: AC
  Administered 2013-09-24: 40 meq via ORAL
  Filled 2013-09-24: qty 2

## 2013-09-24 MED ORDER — FUROSEMIDE 20 MG PO TABS
20.0000 mg | ORAL_TABLET | Freq: Every day | ORAL | Status: DC
  Administered 2013-09-24: 20 mg via ORAL
  Filled 2013-09-24 (×2): qty 1

## 2013-09-24 MED ORDER — POTASSIUM CHLORIDE CRYS ER 20 MEQ PO TBCR
20.0000 meq | EXTENDED_RELEASE_TABLET | Freq: Every day | ORAL | Status: DC
  Administered 2013-09-24 – 2013-09-26 (×3): 20 meq via ORAL
  Filled 2013-09-24 (×5): qty 1

## 2013-09-24 NOTE — Clinical Social Work Placement (Signed)
Clinical Social Work Department CLINICAL SOCIAL WORK PLACEMENT NOTE 09/24/2013  Patient:  Laura Zuniga, Laura Zuniga  Account Number:  [id number] [place] date:  09/16/2013  Clinical Social Worker:  Kemper Durie, [place]  Date/time:  09/18/2013 10:00 AM  Clinical Social Work is seeking post-discharge placement for this patient at the following level of care:   [place]   (*CSW will update this form in Epic as items are completed)   09/18/2013  Patient/family provided with [place] Department of Clinical Social Work's list of facilities offering this level of care within the geographic area requested by the patient (or if unable, by the patient's family).  09/18/2013  Patient/family informed of their freedom to choose among providers that offer the needed level of care, that participate in Medicare, Medicaid or managed care program needed by the patient, have an available bed and are willing to accept the patient.  09/18/2013  Patient/family informed of [facility]' ownership interest in [facility], as well as of the fact that they are under no obligation to receive care at this facility.  PASARR submitted to EDS on 09/18/2013 PASARR number received on 09/18/2013  FL2 transmitted to all facilities in geographic area requested by pt/family on  09/18/2013 FL2 transmitted to all facilities within larger geographic area on   Patient informed that his/her managed care company has contracts with or will negotiate with  certain facilities, including the following:     Patient/family informed of bed offers received:  09/18/2013 Patient chooses bed at [facility] Physician recommends and patient chooses bed at    Patient to be transferred to [place] on   Patient to be transferred to facility by Ambulance Patient and family notified of transfer on  Name of family member notified:    The following physician request were entered in Epic:   Additional  Comments: Patient encouraged family to determine which facility they will want.    Liz Beach MSW, [place], [place], [phone]

## 2013-09-24 NOTE — Progress Notes (Signed)
Physical Therapy Treatment Patient Details Name: Laura Zuniga MRN: [id number] DOB: 1918-03-17 Today's Date: 09/24/2013    History of Present Illness Adm 6/10 with colitis, Cdiff PMHx- CHF, CAD, COPD, lives alone    PT Comments    Pt limited in therapy today due to uncontrollable diarrhea. Pt very pleasant and motivated but was unable to mobilize other than from chair to bed due to diarrhea. Pt also demo increased fatigue and SOB with minimal activity. Pt continues to be great candidate for short term post acute rehab when medically stable.   Follow Up Recommendations  SNF     Equipment Recommendations  None recommended by PT    Recommendations for Other Services OT consult     Precautions / Restrictions Precautions Precautions: Fall Precaution Comments: pt on 2L o2 Restrictions Weight Bearing Restrictions: No    Mobility  Bed Mobility Overal bed mobility: Needs Assistance Bed Mobility: Sit to Sidelying;Rolling Rolling: Supervision       Sit to sidelying: Min assist General bed mobility comments: (A) to advance LEs into bed and into sidelying position due to overall deconditioning; cues for sequencing   Transfers Overall transfer level: Needs assistance Equipment used: Rolling walker (2 wheeled) Transfers: Sit to/from Stand Sit to Stand: Min assist         General transfer comment: performed sit to stand x 3; with each stand pt experienced unctrollable loose BM; pt required min (A) and max cues for safety for transfers; pt fatigued quickly with transfers and demo shallow labored breathing on 2L O2   Ambulation/Gait Ambulation/Gait assistance: Min assist Ambulation Distance (Feet): 8 Feet Assistive device: Rolling walker (2 wheeled) Gait Pattern/deviations: Step-through pattern;Shuffle;Decreased stride length;Wide base of support;Trunk flexed Gait velocity: decreased Gait velocity interpretation: Below normal speed for age/gender General Gait Details:  cues for upright posture and sequencing with RW; pt limited in mobility due to uncontrollable loose BMs with activity; min (A) to maintain balance and manage RW    Stairs            Wheelchair Mobility    Modified Rankin (Stroke Patients Only)       Balance Overall balance assessment: Needs assistance Sitting-balance support: Feet supported;No upper extremity supported Sitting balance-Leahy Scale: Fair     Standing balance support: During functional activity;Bilateral upper extremity supported Standing balance-Leahy Scale: Poor Standing balance comment: requires heavy support of UEs on RW to maintain balance; tolerated standing <1 min for pericare and demo labored and shallow breathing                    Cognition Arousal/Alertness: Awake/alert Behavior During Therapy: WFL for tasks assessed/performed Overall Cognitive Status: History of cognitive impairments - at baseline       Memory: Decreased short-term memory              Exercises General Exercises - Lower Extremity Ankle Circles/Pumps: AROM;Strengthening;Both;10 reps;Seated Long Arc Quad: AROM;Seated;Both;10 reps Heel Slides: AROM;Strengthening;Both;Supine;10 reps Hip Flexion/Marching: AROM;Seated;Both;10 reps    General Comments General comments (skin integrity, edema, etc.): daughter present throughout session       Pertinent Vitals/Pain Denies any pain this session    Home Living                      Prior Function            PT Goals (current goals can now be found in the care plan section) Acute Rehab PT Goals Patient Stated Goal: return to  her home alone PT Goal Formulation: With patient/family Time For Goal Achievement: 09/24/13 Potential to Achieve Goals: Good Progress towards PT goals: Not progressing toward goals - comment (due to controllable diarrhea )    Frequency  Min 2X/week    PT Plan Current plan remains appropriate;Frequency needs to be updated     Co-evaluation             End of Session Equipment Utilized During Treatment: Gait belt;Oxygen (2LO2 ) Activity Tolerance: Patient limited by fatigue;Other (comment) (uncontrollable diarrhea ) Patient left: in bed;with call bell/phone within reach;with nursing/sitter in room;with family/visitor present     Time: 1435-1500 PT Time Calculation (min): 25 min  Charges:  $Gait Training: 8-22 mins $Therapeutic Activity: 8-22 mins                    G CodesGustavus Bryant, [place]  [phone] 09/24/2013, 3:33 PM

## 2013-09-24 NOTE — Progress Notes (Signed)
In and out done on pt, pt voided 250 ml.

## 2013-09-24 NOTE — Progress Notes (Signed)
Lysle Rubens MD aware of pt output. MD states no foley at the moment. Wait another 6 hours to see if pt voids. Will continue to monitor pt.

## 2013-09-24 NOTE — Progress Notes (Signed)
Pt not voided since foley removed at 8am. Bladder scan revealed up to 252 ml of retained urine. Husain MD paged and gave order to In and Out cath the pt. If pt is retaining too much urine, MD order to put in foley.

## 2013-09-24 NOTE — Progress Notes (Signed)
Patient ID: Laura Zuniga, female   DOB: 1917/12/06, 80 y.o.   MRN: [id number] [facility] Gastroenterology Progress Note  Laura Zuniga 80 y.o. 08-07-1917   Subjective: Loose stools reported (black smear this morning). Sitting in chair. Daughter at bedside.  Objective: Vital signs in last 24 hours: Filed Vitals:   09/24/13 0547  BP: 122/69  Pulse: 129  Temp: 98.4 F (36.9 C)  Resp: 18    Physical Exam: Gen: elderly, frail, lethargic, no acute distress Abd: minimal periumbilical tenderness, soft, nondistended  Lab Results:  Recent Labs  09/22/13 0640 09/23/13 0510 09/24/13 0500  NA 133* 137 136*  K 3.1* 3.4* 3.3*  CL 100 104 102  CO2 18* 21 23  GLUCOSE 91 84 116*  BUN 31* 23 15  CREATININE 1.58* 1.09 0.84  CALCIUM 8.1* 8.0* 8.2*  PHOS 3.6 2.7  --     Recent Labs  09/22/13 0640 09/23/13 0510  ALBUMIN 2.0* 2.1*    Recent Labs  09/22/13 0640 09/23/13 0500  WBC 14.6* 11.5*  HGB 9.8* 9.3*  HCT 28.3* 26.8*  MCV 92.5 93.7  PLT 287 314   No results found for this basename: LABPROT, INR,  in the last 72 hours    Assessment/Plan: Resolving Pseudomembranous Colitis (C. Diff colitis): Complete 10 day course of Dificid. Change to Cipro PO tomorrow and complete a 10 day course. D/C Flagyl IV tomorrow and PO Flagyl not needed at D/C. Continue supportive care. Should be ok for rehab tomorrow from a GI standpoint. Will sign off. Call if questions.   [place] C. 09/24/2013, 1:58 PM

## 2013-09-24 NOTE — Progress Notes (Signed)
Patient has not voided since earlier today. Patient has tried to void and was unsuccessful. Day RN informed night RN that MD would like to know by 2230 if patient has voided. MD has been called.

## 2013-09-24 NOTE — Progress Notes (Signed)
Subjective: Pt tolerated full liquid Still some stool  Objective: Vital signs in last 24 hours: Temp:  [94 F (34.4 C)-98.4 F (36.9 C)] 98.4 F (36.9 C) (06/18 0547) Pulse Rate:  [64-129] 129 (06/18 0547) Resp:  [17-18] 18 (06/18 0547) BP: (113-131)/(57-72) 122/69 mmHg (06/18 0547) SpO2:  [94 %-97 %] 95 % (06/18 0547) Weight:  [72.8 kg (160 lb 7.9 oz)] 72.8 kg (160 lb 7.9 oz) (06/18 0547) Weight change: -0.1 kg (-3.5 oz) Last BM Date: 09/23/13  Intake/Output from previous day: 06/17 0701 - 06/18 0700 In: 684 [P.O.:684] Out: 1250 [Urine:1250] Intake/Output this shift:    General appearance: alert Resp: diminished breath sounds bibasilar Cardio: regular rate and rhythm GI: soft, non-tender; bowel sounds normal; no masses,  no organomegaly  Lab Results:  Recent Labs  09/22/13 0640 09/23/13 0500  WBC 14.6* 11.5*  HGB 9.8* 9.3*  HCT 28.3* 26.8*  PLT 287 314   BMET  Recent Labs  09/23/13 0510 09/24/13 0500  NA 137 136*  K 3.4* 3.3*  CL 104 102  CO2 21 23  GLUCOSE 84 116*  BUN 23 15  CREATININE 1.09 0.84  CALCIUM 8.0* 8.2*    Studies/Results: Dg Chest Port 1 View  09/22/2013   CLINICAL DATA:  Right IJ line placement.  EXAM: PORTABLE CHEST - 1 VIEW  COMPARISON:  09/20/2013  FINDINGS: There has been interval placement of a right jugular central venous catheter with tip overlying the mid to lower SVC. Cardiac silhouette remains mildly enlarged, unchanged. Thoracic aortic calcification is noted. The lungs remain hypoinflated with bibasilar parenchymal lung opacities, slightly improved on the right. No pneumothorax is identified. Small left pleural effusion is unchanged. Surgical clips are noted in the right upper abdomen. No acute osseous abnormality is identified.  IMPRESSION: 1. Right jugular catheter placement with tip overlying the mid to lower SVC. 2. Hypoinflation with slightly improved aeration of the right lung base. Persistent bibasilar lung opacities may  reflect atelectasis.   Electronically Signed   By: Logan Bores   On: 09/22/2013 16:17    Medications: I have reviewed the patient's current medications.  Assessment/Plan: C. difficile colitis, Shigella- Pancolitis on CT- patient on Flagyl and cipro,dificid Flex sig /EGD results noted- infectionous colitis/ BX negative; EGD negative- advance diet as tolerated; Change to PO cipro tomorrow, per GI- Dificid po for 10 days.  Atelectasis- On chest xray- incentive spirometry  COPD- neb tx. O2 prn H/o diastolic HF- restart lasix 20 mg daily Acute on chronic kidney disease/ ATN- improved - per renal- Cr normal today.  Mild hyponatremia corrected/ hypokalemia- replace K  Hypertension, coronary artery disease, history of diastolic dysfunction- Hold ARB due to ARF  Weakness/FTT: PT/OT  SNF placement- tomorrow D/c foley.   LOS: 8 days   Hanley Woerner 09/24/2013, 7:31 AM

## 2013-09-24 NOTE — Clinical Social Work Placement (Deleted)
Clinical Social Work Department CLINICAL SOCIAL WORK PLACEMENT NOTE 09/24/2013  Patient:  Laura Zuniga  Account Number:  [id number] Admit date:  09/22/2013  Clinical Social Worker:  Kemper Durie, [place]  Date/time:  09/24/2013 04:00 PM  Clinical Social Work is seeking post-discharge placement for this patient at the following level of care:   [place]   (*CSW will update this form in Epic as items are completed)   09/24/2013  Patient/family provided with [place] Department of Clinical Social Work's list of facilities offering this level of care within the geographic area requested by the patient (or if unable, by the patient's family).  09/24/2013  Patient/family informed of their freedom to choose among providers that offer the needed level of care, that participate in Medicare, Medicaid or managed care program needed by the patient, have an available bed and are willing to accept the patient.  09/24/2013  Patient/family informed of [facility]' ownership interest in [facility], as well as of the fact that they are under no obligation to receive care at this facility.  PASARR submitted to EDS on  PASARR number received on   FL2 transmitted to all facilities in geographic area requested by pt/family on  09/24/2013 FL2 transmitted to all facilities within larger geographic area on   Patient informed that his/her managed care company has contracts with or will negotiate with  certain facilities, including the following:     Patient/family informed of bed offers received:   Patient chooses bed at  Physician recommends and patient chooses bed at    Patient to be transferred to  on   Patient to be transferred to facility by  Patient and family notified of transfer on  Name of family member notified:    The following physician request were entered in Epic:   Additional Comments:    Liz Beach MSW, [place], [place], [phone]

## 2013-09-25 LAB — BASIC METABOLIC PANEL
BUN: 12 mg/dL (ref 6–23)
CHLORIDE: 102 meq/L (ref 96–112)
CO2: 24 mEq/L (ref 19–32)
Calcium: 8 mg/dL — ABNORMAL LOW (ref 8.4–10.5)
Creatinine, Ser: 0.83 mg/dL (ref 0.50–1.10)
GFR calc Af Amer: 68 mL/min — ABNORMAL LOW (ref >90)
GFR calc non Af Amer: 59 mL/min — ABNORMAL LOW (ref >90)
GLUCOSE: 108 mg/dL — AB (ref 70–99)
Potassium: 4 mEq/L (ref 3.7–5.3)
Sodium: 136 mEq/L — ABNORMAL LOW (ref 137–147)

## 2013-09-25 MED ORDER — TAMSULOSIN HCL 0.4 MG PO CAPS
0.4000 mg | ORAL_CAPSULE | Freq: Every day | ORAL | Status: DC
  Administered 2013-09-25 – 2013-09-30 (×6): 0.4 mg via ORAL
  Filled 2013-09-25 (×8): qty 1

## 2013-09-25 MED ORDER — FUROSEMIDE 40 MG PO TABS
40.0000 mg | ORAL_TABLET | Freq: Every day | ORAL | Status: DC
  Administered 2013-09-25 – 2013-09-26 (×2): 40 mg via ORAL
  Filled 2013-09-25 (×4): qty 1

## 2013-09-25 NOTE — Progress Notes (Signed)
Nurse tech checked urine residual and it was 293cc. Will pass on to day shift.

## 2013-09-25 NOTE — Progress Notes (Signed)
It has almost been 8 hours since patient last urinated. MD asked that he is made aware if she does not void. Will pass on to day nurse during report.

## 2013-09-25 NOTE — Progress Notes (Signed)
Subjective: Pt with urine retention after foley d/c Some worse of leg swelling  Objective: Vital signs in last 24 hours: Temp:  [97.4 F (36.3 C)-98.5 F (36.9 C)] 98.3 F (36.8 C) (06/19 0555) Pulse Rate:  [65-86] 65 (06/19 0555) Resp:  [18] 18 (06/19 0555) BP: (118-147)/(58-84) 118/68 mmHg (06/19 0555) SpO2:  [97 %-100 %] 98 % (06/19 0555) Weight:  [73.4 kg (161 lb 13.1 oz)] 73.4 kg (161 lb 13.1 oz) (06/19 0230) Weight change: 0.6 kg (1 lb 5.2 oz) Last BM Date: 09/24/13  Intake/Output from previous day: 06/18 0701 - 06/19 0700 In: 727.3 [P.O.:150; I.V.:177.3; IV Piggyback:400] Out: -  Intake/Output this shift:    General appearance: alert Resp: diminished breath sounds bibasilar Cardio: regular rate and rhythm GI: soft tender mild distended BS present Extremities: edema in upper thigh  Lab Results:  Recent Labs  09/23/13 0500  WBC 11.5*  HGB 9.3*  HCT 26.8*  PLT 314   BMET  Recent Labs  09/24/13 0500 09/25/13 0455  NA 136* 136*  K 3.3* 4.0  CL 102 102  CO2 23 24  GLUCOSE 116* 108*  BUN 15 12  CREATININE 0.84 0.83  CALCIUM 8.2* 8.0*    Studies/Results: No results found.  Medications: I have reviewed the patient's current medications.  Assessment/Plan: C. difficile colitis, Shigella- Pancolitis on CT- patient on Flagyl and cipro,dificid Flex sig /EGD results noted- infectionous colitis/ BX negative; EGD negative- advance diet as tolerated;d/c flagyl- continue others Urinary retention: d/c oxycodone; add Flomax; watch for next 24 hrs- I/O cath if needed; ck urine.  Atelectasis- On chest xray- incentive spirometry  COPD- neb tx. O2 prn  H/o diastolic HF- restart lasix 40 mg daily Acute on chronic kidney disease/ ATN- improved - per renal- Cr normal today.  Mild hyponatremia corrected/ hypokalemia- replace K- ok  Hypertension, coronary artery disease, history of diastolic dysfunction- BP is ok Weakness/FTT: PT/OT - continue with PT over weekened   SNF will hold on for weekend due to medical reasons   LOS: 9 days   HUSAIN,KARRAR 09/25/2013, 7:22 AM

## 2013-09-25 NOTE — Progress Notes (Signed)
Patient's daughter informed RN that patient had voided. Bladder scan showed only 85cc residual. MD told RN to hold off on placing foley and to update in another 8 hours. Will continue to monitor

## 2013-09-26 LAB — BASIC METABOLIC PANEL
BUN: 9 mg/dL (ref 6–23)
CALCIUM: 8 mg/dL — AB (ref 8.4–10.5)
CO2: 25 meq/L (ref 19–32)
CREATININE: 0.79 mg/dL (ref 0.50–1.10)
Chloride: 99 mEq/L (ref 96–112)
GFR calc Af Amer: 80 mL/min — ABNORMAL LOW (ref >90)
GFR calc non Af Amer: 69 mL/min — ABNORMAL LOW (ref >90)
Glucose, Bld: 101 mg/dL — ABNORMAL HIGH (ref 70–99)
Potassium: 4.1 mEq/L (ref 3.7–5.3)
SODIUM: 135 meq/L — AB (ref 137–147)

## 2013-09-26 LAB — URINE MICROSCOPIC-ADD ON

## 2013-09-26 LAB — URINALYSIS, ROUTINE W REFLEX MICROSCOPIC
BILIRUBIN URINE: NEGATIVE
Glucose, UA: NEGATIVE mg/dL
Ketones, ur: NEGATIVE mg/dL
NITRITE: NEGATIVE
PH: 5.5 (ref 5.0–8.0)
Protein, ur: NEGATIVE mg/dL
Specific Gravity, Urine: 1.015 (ref 1.005–1.030)
Urobilinogen, UA: 0.2 mg/dL (ref 0.0–1.0)

## 2013-09-26 MED ORDER — FUROSEMIDE 40 MG PO TABS: 40.0000 mg | ORAL_TABLET | Freq: Once | ORAL | Status: DC

## 2013-09-26 NOTE — Clinical Social Work Note (Signed)
CSW continues to follow this patient for d/c planning needs. CSW spoke with patient's RN Marc Morgans, who stated no anticipated d/c this weekend. Per Marc Morgans, patient will be ready for d/c on Monday.  [place], [place] Weekend Clinical Social Worker [phone]

## 2013-09-26 NOTE — Progress Notes (Signed)
Verbal order to give patient 1 time dose of lasix-40mg . RN putting in order now.

## 2013-09-26 NOTE — Progress Notes (Signed)
Patient's daughter is concerned that the patient is not getting enough lasix in the hospital (only receiving 40 daily). Daughter states that at home patient takes 80mg  in the morning hours and then 40mg  in the evening. Daughter states that she would really like this problem resolved tonight before she leaves. MD has been paged, RN is awaiting call. Will call RT in the main time.

## 2013-09-26 NOTE — Progress Notes (Signed)
Pt's family c/o pt has trouble breathing . family states she had difficulty in breathing during the day time too, checked o2 sat is 99%. Pt is on o2 nasal cannula @2  liter, notified to on call RT for breathing treatment.pt denies pain and no s/s of distress noted. Will cont to monitor.

## 2013-09-26 NOTE — Progress Notes (Signed)
Subjective: Pt feel better Urine ok- no retention Less stool  Objective: Vital signs in last 24 hours: Temp:  [98 F (36.7 C)-98.4 F (36.9 C)] 98 F (36.7 C) (06/20 0623) Pulse Rate:  [73-77] 74 (06/20 0623) Resp:  [16-18] 16 (06/20 0623) BP: (111-119)/(65-70) 111/65 mmHg (06/20 0623) SpO2:  [95 %-100 %] 100 % (06/19 2247) Weight:  [73.8 kg (162 lb 11.2 oz)] 73.8 kg (162 lb 11.2 oz) (06/20 0246) Weight change: 0.4 kg (14.1 oz) Last BM Date: 09/25/13  Intake/Output from previous day: 06/19 0701 - 06/20 0700 In: 1380 [P.O.:640; I.V.:240; IV Piggyback:500] Out: 1190 [Urine:1190] Intake/Output this shift:    General appearance: alert Resp: diminished breath sounds bibasilar Cardio: regular rate and rhythm GI: soft, non-tender; bowel sounds normal; no masses,  no organomegaly Extremities: edema +1  Lab Results: No results found for this basename: WBC, HGB, HCT, PLT,  in the last 72 hours BMET  Recent Labs  09/25/13 0455 09/26/13 0440  NA 136* 135*  K 4.0 4.1  CL 102 99  CO2 24 25  GLUCOSE 108* 101*  BUN 12 9  CREATININE 0.83 0.79  CALCIUM 8.0* 8.0*    Studies/Results: No results found.  Medications: I have reviewed the patient's current medications.  Assessment/Plan: Assessment/Plan:  C. difficile colitis, Shigella- Pancolitis on CT- patient cipro,dificid ; Flex sig /EGD results noted- infectionous colitis/ BX negative; EGD negative- diet- regular d/c flagyl- continue others  Urinary retention: better with flomax d/c oxycodone;  Atelectasis- On chest xray- incentive spirometry  COPD- neb tx. O2 prn  H/o diastolic HF- continue  lasix 40 mg daily  Acute on chronic kidney disease/ ATN- improved - - Cr normal today.  Mild hyponatremia corrected/ hypokalemia- replace K- ok  Hypertension, coronary artery disease, history of diastolic dysfunction- BP is ok  Weakness/FTT: PT/OT - continue with PT over weekened  SNF d/c probable 09/28/13- [facility]   LOS: 10  days   HUSAIN,KARRAR 09/26/2013, 9:39 AM

## 2013-09-27 ENCOUNTER — Encounter (HOSPITAL_COMMUNITY): Payer: Self-pay | Admitting: Cardiology

## 2013-09-27 ENCOUNTER — Inpatient Hospital Stay (HOSPITAL_COMMUNITY): Payer: Medicare HMO

## 2013-09-27 DIAGNOSIS — I480 Paroxysmal atrial fibrillation: Secondary | ICD-10-CM | POA: Diagnosis present

## 2013-09-27 DIAGNOSIS — I4891 Unspecified atrial fibrillation: Secondary | ICD-10-CM

## 2013-09-27 LAB — TROPONIN I

## 2013-09-27 LAB — CBC WITH DIFFERENTIAL/PLATELET
Basophils Absolute: 0 10*3/uL (ref 0.0–0.1)
Basophils Relative: 1 % (ref 0–1)
EOS PCT: 1 % (ref 0–5)
Eosinophils Absolute: 0.1 10*3/uL (ref 0.0–0.7)
HCT: 32 % — ABNORMAL LOW (ref 36.0–46.0)
HEMOGLOBIN: 10.7 g/dL — AB (ref 12.0–15.0)
LYMPHS ABS: 0.6 10*3/uL — AB (ref 0.7–4.0)
LYMPHS PCT: 7 % — AB (ref 12–46)
MCH: 32 pg (ref 26.0–34.0)
MCHC: 33.4 g/dL (ref 30.0–36.0)
MCV: 95.8 fL (ref 78.0–100.0)
MONO ABS: 0.6 10*3/uL (ref 0.1–1.0)
MONOS PCT: 7 % (ref 3–12)
NEUTROS ABS: 7.3 10*3/uL (ref 1.7–7.7)
Neutrophils Relative %: 84 % — ABNORMAL HIGH (ref 43–77)
Platelets: 274 10*3/uL (ref 150–400)
RBC: 3.34 MIL/uL — AB (ref 3.87–5.11)
RDW: 13.3 % (ref 11.5–15.5)
WBC: 8.6 10*3/uL (ref 4.0–10.5)

## 2013-09-27 LAB — COMPREHENSIVE METABOLIC PANEL
ALBUMIN: 2.4 g/dL — AB (ref 3.5–5.2)
ALK PHOS: 56 U/L (ref 39–117)
ALT: 21 U/L (ref 0–35)
AST: 36 U/L (ref 0–37)
BUN: 6 mg/dL (ref 6–23)
CALCIUM: 7.8 mg/dL — AB (ref 8.4–10.5)
CO2: 30 mEq/L (ref 19–32)
CREATININE: 0.65 mg/dL (ref 0.50–1.10)
Chloride: 96 mEq/L (ref 96–112)
GFR calc Af Amer: 85 mL/min — ABNORMAL LOW (ref >90)
GFR calc non Af Amer: 74 mL/min — ABNORMAL LOW (ref >90)
Glucose, Bld: 102 mg/dL — ABNORMAL HIGH (ref 70–99)
POTASSIUM: 3.3 meq/L — AB (ref 3.7–5.3)
Sodium: 136 mEq/L — ABNORMAL LOW (ref 137–147)
TOTAL PROTEIN: 5.6 g/dL — AB (ref 6.0–8.3)
Total Bilirubin: 0.3 mg/dL (ref 0.3–1.2)

## 2013-09-27 LAB — PRO B NATRIURETIC PEPTIDE: PRO B NATRI PEPTIDE: 4678 pg/mL — AB (ref 0–450)

## 2013-09-27 LAB — URINE CULTURE

## 2013-09-27 LAB — MAGNESIUM: Magnesium: 1.3 mg/dL — ABNORMAL LOW (ref 1.5–2.5)

## 2013-09-27 LAB — TSH: TSH: 8.39 u[IU]/mL — ABNORMAL HIGH (ref 0.350–4.500)

## 2013-09-27 LAB — MRSA PCR SCREENING: MRSA BY PCR: POSITIVE — AB

## 2013-09-27 MED ORDER — DILTIAZEM HCL 100 MG IV SOLR
5.0000 mg/h | INTRAVENOUS | Status: AC
  Administered 2013-09-27: 5 mg/h via INTRAVENOUS
  Filled 2013-09-27: qty 100

## 2013-09-27 MED ORDER — POTASSIUM CHLORIDE CRYS ER 20 MEQ PO TBCR: 20.0000 meq | EXTENDED_RELEASE_TABLET | Freq: Once | ORAL | Status: DC

## 2013-09-27 MED ORDER — AMIODARONE HCL IN DEXTROSE 360-4.14 MG/200ML-% IV SOLN
30.0000 mg/h | INTRAVENOUS | Status: AC
  Administered 2013-09-27 – 2013-09-28 (×2): 30 mg/h via INTRAVENOUS
  Filled 2013-09-27 (×5): qty 200

## 2013-09-27 MED ORDER — POTASSIUM CHLORIDE CRYS ER 20 MEQ PO TBCR: 30.0000 meq | EXTENDED_RELEASE_TABLET | Freq: Every day | ORAL | Status: DC

## 2013-09-27 MED ORDER — FUROSEMIDE 10 MG/ML IJ SOLN
INTRAMUSCULAR | Status: AC
  Administered 2013-09-27: 40 mg via INTRAVENOUS
  Filled 2013-09-27: qty 4

## 2013-09-27 MED ORDER — FUROSEMIDE 40 MG PO TABS
40.0000 mg | ORAL_TABLET | Freq: Two times a day (BID) | ORAL | Status: DC
  Filled 2013-09-27 (×2): qty 1

## 2013-09-27 MED ORDER — POTASSIUM CHLORIDE CRYS ER 20 MEQ PO TBCR
20.0000 meq | EXTENDED_RELEASE_TABLET | Freq: Two times a day (BID) | ORAL | Status: DC
  Administered 2013-09-27: 20 meq via ORAL
  Filled 2013-09-27 (×3): qty 1
  Filled 2013-09-27: qty 2

## 2013-09-27 MED ORDER — FUROSEMIDE 40 MG PO TABS
40.0000 mg | ORAL_TABLET | Freq: Once | ORAL | Status: AC
  Administered 2013-09-27: 40 mg via ORAL
  Filled 2013-09-27: qty 1

## 2013-09-27 MED ORDER — POTASSIUM CHLORIDE CRYS ER 20 MEQ PO TBCR
40.0000 meq | EXTENDED_RELEASE_TABLET | Freq: Once | ORAL | Status: AC
  Administered 2013-09-27: 40 meq via ORAL

## 2013-09-27 MED ORDER — FUROSEMIDE 10 MG/ML IJ SOLN
40.0000 mg | Freq: Two times a day (BID) | INTRAMUSCULAR | Status: DC
  Administered 2013-09-27: 40 mg via INTRAVENOUS

## 2013-09-27 MED ORDER — MAGNESIUM SULFATE 40 MG/ML IJ SOLN
2.0000 g | Freq: Once | INTRAMUSCULAR | Status: AC
  Administered 2013-09-27: 2 g via INTRAVENOUS
  Filled 2013-09-27: qty 50

## 2013-09-27 MED ORDER — AMIODARONE LOAD VIA INFUSION
150.0000 mg | Freq: Once | INTRAVENOUS | Status: AC
  Administered 2013-09-27: 150 mg via INTRAVENOUS
  Filled 2013-09-27: qty 83.34

## 2013-09-27 MED ORDER — LEVALBUTEROL HCL 0.63 MG/3ML IN NEBU
0.6300 mg | INHALATION_SOLUTION | Freq: Four times a day (QID) | RESPIRATORY_TRACT | Status: DC
  Administered 2013-09-28 (×2): 0.63 mg via RESPIRATORY_TRACT
  Filled 2013-09-27 (×5): qty 3

## 2013-09-27 MED ORDER — AMIODARONE HCL IN DEXTROSE 360-4.14 MG/200ML-% IV SOLN
60.0000 mg/h | INTRAVENOUS | Status: AC
  Administered 2013-09-27 (×2): 60 mg/h via INTRAVENOUS

## 2013-09-27 MED ORDER — FUROSEMIDE 10 MG/ML IJ SOLN
80.0000 mg | Freq: Two times a day (BID) | INTRAMUSCULAR | Status: DC
  Administered 2013-09-27 – 2013-09-28 (×3): 80 mg via INTRAVENOUS
  Filled 2013-09-27 (×6): qty 8

## 2013-09-27 NOTE — Progress Notes (Signed)
MD returned page, notified of patient in Afib with [facility]VR. Orders given to transfer to [facility] and start Cardizem drip. Cardiology consulted. Joslyn Hy, MSN, RN, [employer]

## 2013-09-27 NOTE — Progress Notes (Signed)
Subjective: More SOB yersterday Stool better- formed  Objective: Vital signs in last 24 hours: Temp:  [98.1 F (36.7 C)-98.8 F (37.1 C)] 98.8 F (37.1 C) (06/21 0438) Pulse Rate:  [69-90] 70 (06/21 0438) Resp:  [18] 18 (06/21 0438) BP: (127-161)/(66-75) 128/66 mmHg (06/21 0438) SpO2:  [98 %-100 %] 98 % (06/21 0438) Weight:  [73.9 kg (162 lb 14.7 oz)] 73.9 kg (162 lb 14.7 oz) (06/21 0438) Weight change: 0.1 kg (3.5 oz) Last BM Date: 09/26/13  Intake/Output from previous day: 06/20 0701 - 06/21 0700 In: 860.2 [P.O.:540; I.V.:120.2; IV Piggyback:200] Out: -  Intake/Output this shift: Total I/O In: 240 [P.O.:240] Out: -   General appearance: alert Resp: diminished breath sounds bibasilar- wheezing Cardio: regular rate and rhythm GI: soft, non-tender; bowel sounds normal; no masses,  no organomegaly  Lab Results: No results found for this basename: WBC, HGB, HCT, PLT,  in the last 72 hours BMET  Recent Labs  09/25/13 0455 09/26/13 0440  NA 136* 135*  K 4.0 4.1  CL 102 99  CO2 24 25  GLUCOSE 108* 101*  BUN 12 9  CREATININE 0.83 0.79  CALCIUM 8.0* 8.0*    Studies/Results: No results found.  Medications: I have reviewed the patient's current medications.  Assessment/Plan: C. difficile colitis, Shigella- Pancolitis on CT- patient cipro,dificid ; Flex sig /EGD results noted- infectionous colitis/ BX negative; EGD negative- diet- regular d/c flagyl- continue others  Urinary retention: better with flomax d/c oxycodone;  Atelectasis- On chest xray- incentive spirometry  COPD- neb tx. O2 prn  H/o diastolic HF- increase leg edema and sob; IV lasix 40 mg bid- chest xray  Acute on chronic kidney disease/ ATN- improved - - Cr normal today.  Mild hyponatremia corrected/ hypokalemia- replace K- ok  Hypertension, coronary artery disease, history of diastolic dysfunction- BP is ok  Weakness/FTT: PT/OT - continue with PT over weekened  SNF d/c probable 09/28/13- [facility]     LOS: 11 days   HUSAIN,KARRAR 09/27/2013, 10:13 AM

## 2013-09-27 NOTE — Progress Notes (Signed)
Report given to Heide Guile RN on 2H. Cardiology here now to see patient will transfer once cardiology is finished. Joslyn Hy, MSN, RN, [employer]

## 2013-09-27 NOTE — Consult Note (Signed)
Reason for Consult:  A fib with RVR  Referring Physician: Dr. Lysle Rubens   PCP: Wenda Low, MD Primary Cardiologist:Dr. Luciano Cutter Jerilynn Mages Kama is an 80 y.o. female.    Chief Complaint:  abd pain, nausea and diarrhea-admitted 09/16/13  HPI: 80 y.o. female with PMH significant for HTN, chronic diastolic heart failure, CKD stage 3-now AKI, GERD, hx of diverticulosis, HLD and COPD; came to ED complaining of 3-4 days of abdominal pain (diffuse, cramping in nature; with associated nausea and fever/chills), also reports mucous sanguineous diarrhea and general malaise and weakness. Patient reports she was seen over the weekend at [facility], prescribed antibiotics (flagyl and cipro); but symptoms has been continue gradually worsening and she is very weak and deconditioned on admit.  In ED was found with leukocytosis, acute on chronic renal failure, hyponatremia and soft BP. physical exam with mild dehydration.  GI consult with Dr. Michail Sermon with fex sig: pseudomembranous colitis with blood clots, biopsies done and dx of pseudomembranous colitis, no ischemia seen on biopsies.   Pt on diflicid and IV flagyl.  EGD with pyloric channel stenosis. C. Difficile colitis also problems with urinary retention.    Yesterday  And up to 3 days ago pt developed increasing SOB, wheezing and lower ext edema. Lasix adjusted, she had been on a lower dose due to dehydration.  Today she developed a fib with RVR.   EKG:  Atrial fibrillation with rapid ventricular response, Low voltage QRS, Nonspecific ST and T wave abnormality, Abnormal ECG  Pt denies any chest pain, only complains of SOB and wheezing, this is much worse than her usual SOB.  Her Stools are more soft than liquid now-improving.   Hx of CAD by Nuc. Stress test in 2007- + defect in anterior wall.  No cardiac cath because of high risk.  Last Echo 2014 with EF 60-65%, mild MR, mild AS, mild AR, mild Lt atrial enlargement.      Past Medical  History  Diagnosis Date  . CHF (congestive heart failure)   . GERD (gastroesophageal reflux disease)   . Hypertension   . Diverticulitis   . UTI (urinary tract infection)   . Blockage of coronary artery of heart 2007    never had cardiac cath, CAD dx by positive ant. wall defect on Nuc. study.  no cath due to high risk.  . Macular degeneration     Past Surgical History  Procedure Laterality Date  . Cholecystectomy    . Appendectomy    . Vaginal hysterectomy    . Eye surgery    . Esophagogastroduodenoscopy N/A 09/22/2013    Procedure: ESOPHAGOGASTRODUODENOSCOPY (EGD);  Surgeon: Lear Ng, MD;  Location[facility] ENDOSCOPY;  Service: Endoscopy;  Laterality: N/A;  . Flexible sigmoidoscopy N/A 09/22/2013    Procedure: FLEXIBLE SIGMOIDOSCOPY;  Surgeon: Lear Ng, MD;  Location: [facility];  Service: Endoscopy;  Laterality: N/A;  . Flexible sigmoidoscopy N/A 09/22/2013    Procedure: FLEXIBLE SIGMOIDOSCOPY;  Surgeon: Lear Ng, MD;  Location: [facility];  Service: Endoscopy;  Laterality: N/A;  unprepped/ egd first  . Esophagogastroduodenoscopy N/A 09/22/2013    Procedure: ESOPHAGOGASTRODUODENOSCOPY (EGD);  Surgeon: Lear Ng, MD;  Location: [facility] ENDOSCOPY;  Service: Endoscopy;  Laterality: N/A;    Family History  Problem Relation Age of Onset  . Heart attack Father 9  . Heart attack Sister   . Cancer Brother   . Cancer Brother   . Cirrhosis Brother   .  Heart disease Brother   . Cancer Brother   . COPD Brother   . COPD Sister   . Cancer Sister   . Cancer Sister    Social History:  reports that she has never smoked. She does not have any smokeless tobacco history on file. She reports that she does not drink alcohol or use illicit drugs.  Allergies:  Allergies  Allergen Reactions  . Penicillins     unknown  . Sulfonamide Derivatives     REACTION: Rash and swelling    Medications Prior to Admission  Medication Sig Dispense Refill  .  acetaminophen (TYLENOL) 500 MG tablet Take 500-1,000 mg by mouth every 6 (six) hours as needed for moderate pain or headache.      . calcium-vitamin D (OSCAL WITH D) 500-200 MG-UNIT per tablet Take 1 tablet by mouth daily.      . [EXPIRED] ciprofloxacin (CIPRO) 500 MG tablet Take 500 mg by mouth 2 (two) times daily.      . clopidogrel (PLAVIX) 75 MG tablet Take 1 tablet (75 mg total) by mouth daily.  30 tablet  5  . Cranberry 250 MG CAPS Take 1 capsule by mouth daily.      . cyanocobalamin 500 MCG tablet Take 500 mcg by mouth daily.      Marland Kitchen escitalopram (LEXAPRO) 10 MG tablet Take 5 mg by mouth daily.      . ferrous sulfate 325 (65 FE) MG tablet Take 325 mg by mouth daily with breakfast.      . furosemide (LASIX) 40 MG tablet Take 40-80 mg by mouth See admin instructions. Take 2 tablet in the morning and take 1 tablets at 3pm.  Pt may take 1-79m tablet extra in the evening if needed      . isosorbide mononitrate (IMDUR) 60 MG 24 hr tablet 1 tablet by mouth daily  30 tablet  2  . loperamide (IMODIUM) 1 MG/5ML solution Take 3 mg by mouth as needed for diarrhea or loose stools.      . meclizine (ANTIVERT) 25 MG tablet Take 25 mg by mouth 2 (two) times daily as needed for dizziness.       . [EXPIRED] metroNIDAZOLE (FLAGYL) 500 MG tablet Take 500 mg by mouth 2 (two) times daily.      . Multiple Vitamin (MULITIVITAMIN WITH MINERALS) TABS Take 1 tablet by mouth daily.      . Multiple Vitamins-Minerals (ICAPS) CAPS Take 1 capsule by mouth 2 (two) times daily.      . pantoprazole (PROTONIX) 40 MG tablet Take 40 mg by mouth daily.      . polyethylene glycol (MIRALAX / GLYCOLAX) packet Take 17 g by mouth daily as needed for mild constipation.       . pravastatin (PRAVACHOL) 40 MG tablet Take 40 mg by mouth daily.      .Marland KitchenSpacer/Aero-Holding Chambers (AEROCHAMBER PLUS FLO-VU) MISC 1 each as needed.       . valsartan (DIOVAN) 160 MG tablet Take 1 tablet (160 mg total) by mouth daily.  30 tablet  4  . vitamin C  (ASCORBIC ACID) 500 MG tablet Take 500 mg by mouth daily.      . vitamin E 400 UNIT capsule Take 400 Units by mouth daily.        Results for orders placed during the hospital encounter of 09/16/13 (from the past 48 hour(s))  URINALYSIS, ROUTINE W REFLEX MICROSCOPIC     Status: Abnormal   Collection Time  09/26/13  1:00 AM      Result Value Ref Range   Color, Urine YELLOW  YELLOW   APPearance CLOUDY (*) CLEAR   Specific Gravity, Urine 1.015  1.005 - 1.030   pH 5.5  5.0 - 8.0   Glucose, UA NEGATIVE  NEGATIVE mg/dL   Hgb urine dipstick TRACE (*) NEGATIVE   Bilirubin Urine NEGATIVE  NEGATIVE   Ketones, ur NEGATIVE  NEGATIVE mg/dL   Protein, ur NEGATIVE  NEGATIVE mg/dL   Urobilinogen, UA 0.2  0.0 - 1.0 mg/dL   Nitrite NEGATIVE  NEGATIVE   Leukocytes, UA SMALL (*) NEGATIVE  URINE CULTURE     Status: None   Collection Time    09/26/13  1:00 AM      Result Value Ref Range   Specimen Description URINE, RANDOM     Special Requests NONE     Culture  Setup Time       Value: 09/26/2013 11:30     Performed at [facility]       Value: 35,000 COLONIES/ML     Performed at [facility]   Culture       Value: Multiple bacterial morphotypes present, none predominant. Suggest appropriate recollection if clinically indicated.     Performed at [facility]   Report Status 09/27/2013 FINAL    URINE MICROSCOPIC-ADD ON     Status: Abnormal   Collection Time    09/26/13  1:00 AM      Result Value Ref Range   Squamous Epithelial / LPF FEW (*) RARE   WBC, UA 21-50  <3 WBC/hpf   RBC / HPF 0-2  <3 RBC/hpf   Bacteria, UA RARE  RARE   Casts HYALINE CASTS (*) NEGATIVE  BASIC METABOLIC PANEL     Status: Abnormal   Collection Time    09/26/13  4:40 AM      Result Value Ref Range   Sodium 135 (*) 137 - 147 mEq/L   Potassium 4.1  3.7 - 5.3 mEq/L   Chloride 99  96 - 112 mEq/L   CO2 25  19 - 32 mEq/L   Glucose, Bld 101 (*) 70 - 99 mg/dL   BUN 9  6 - 23 mg/dL     Creatinine, Ser 0.79  0.50 - 1.10 mg/dL   Calcium 8.0 (*) 8.4 - 10.5 mg/dL   GFR calc non Af Amer 69 (*) >90 mL/min   GFR calc Af Amer 80 (*) >90 mL/min   Comment: (NOTE)     The eGFR has been calculated using the CKD EPI equation.     This calculation has not been validated in all clinical situations.     eGFR's persistently <90 mL/min signify possible Chronic Kidney     Disease.   Dg Chest Port 1 View  09/27/2013   CLINICAL DATA:  Shortness of breath.  EXAM: PORTABLE CHEST - 1 VIEW  COMPARISON:  Chest x-ray 09/22/2013.  FINDINGS: There is a right-sided internal jugular central venous catheter with tip terminating in the mid superior vena cava. Lung volumes are low. Bibasilar opacities are favored to reflect subsegmental atelectasis, although underlying airspace consolidation is difficult to exclude. Small bilateral pleural effusions (left greater than right). Pulmonary vasculature is within normal limits. Heart size and mediastinal contours are normal. Atherosclerosis in the thoracic aorta.  IMPRESSION: 1. Low lung volumes with small bilateral pleural effusions and probable bibasilar subsegmental atelectasis.   Electronically Signed   By:  [name] M.D.   On: 09/27/2013 15:22    ROS: General:no colds or fevers, no weight changes, + diarrhea on admit Skin:no rashes or ulcers HEENT:no blurred vision, no congestion CV:see HPI PUL:see HPI GI:+diarrhea, no constipation or melena, no indigestion GU:no hematuria, no dysuria- now with some urinary retention but improving MS:no joint pain, no claudication Neuro:no syncope, no lightheadedness Endo:no diabetes, no thyroid disease   Blood pressure 111/71, pulse 150, temperature 98.4 F (36.9 C), temperature source Oral, resp. rate 16, height _0  (1.549 m), weight 162 lb 14.7 oz (73.9 kg), SpO2 98.00%. PE: General:Pleasant affect, obviously not feeling well Skin:Warm and dry, brisk capillary refill HEENT:normocephalic, sclera clear,  mucus membranes moist Neck:supple, no JVD, no bruits  Heart:irreg irreg without murmur, gallup, rub or click Lungs:diminshed, + wheezes  NOT:RRNH, mild mid abd tenderness, + BS, do not palpate liver spleen or masses Ext:1-2+ lower ext edema, 1+ pedal pulses, 2+ radial pulses Neuro:alert and oriented X 3, MAE, follows commands, + facial symmetry    Assessment/Plan Principal Problem:   Colitis Active Problems:   HYPERLIPIDEMIA   DEPRESSION   HYPERTENSION   COPD (chronic obstructive pulmonary disease)   Diastolic CHF, chronic   Dehydration   Leukocytosis, unspecified   Colitis, infectious   ATN (acute tubular necrosis)   C. difficile colitis   Melena   D (diarrhea)   Atrial fibrillation with RVR rate 130-150     INGOLD,LAURA R  Nurse Practitioner Certified [place] Pager [phone] or after 5pm or weekends call [phone] 09/27/2013, 4:27 PM As above, patient seen and examined. Briefly she is a 80 year old female with a past medical history of present coronary disease because of previous abnormal nuclear study, chronic diastolic congestive heart failure, hypertension, renal insufficiency, COPD admitted with Clostridium difficile colitis who I am now asked to evaluate for new onset atrial fibrillation and diastolic congestive heart failure. Last echocardiogram in March of 2014 showed normal LV function and mild aortic/mitral regurgitation. Patient admitted on June 10 with complaints of abdominal pain, nausea and diarrhea. Abdominal CT showed pancolitis. Patient diagnosed with Clostridium difficile colitis. She has been treated with antibiotics. Diuretics were held. Her diarrhea is slowly improving but she has developed marked dyspnea and volume excess. Heart rate noted to be elevated today and electrocardiogram shows new-onset atrial fibrillation. Cardiology asked to evaluate. Patient is markedly volume overloaded on examination. She has pulmonary edema and 3+ lower  extremity edema. Her systolic blood pressure is approximately 110-120. Discontinue Imdur. Plan to treat with low-dose Cardizem. I will add IV amiodarone to assist with rate control as her blood pressure is borderline. Check TSH and echocardiogram. Continue Plavix. I will not add heparin at this point given ongoing colitis. Atrial fibrillation is most likely related to hyperadrenergic state related to Clostridium difficile colitis and also acute diastolic congestive heart failure. If she converts on her own I would not anticoagulate long-term. Add Lasix 80 mg IV twice a day and follow renal function. I have clarified that she is a no CODE BLUE and does not want intubation. Kirk Ruths

## 2013-09-27 NOTE — Progress Notes (Signed)
Patient transferred to 2H17 on monitor with RN without complications. Joslyn Hy, MSN, RN, [employer]

## 2013-09-27 NOTE — Progress Notes (Signed)
Second page out to Dr Lysle Rubens through  His answering service HR in 120-150's. Spoke to [employer]

## 2013-09-27 NOTE — Progress Notes (Signed)
MD notified of patient with increased HR. EKG ordered. Joslyn Hy, MSN, RN, [employer]

## 2013-09-27 NOTE — Progress Notes (Addendum)
Patient HR elevated EKG done patient in atrial Fibrillation with RVR. MD notified through answering service. Amber is the representive that took the message and it was stated this is an emergency.

## 2013-09-28 DIAGNOSIS — I359 Nonrheumatic aortic valve disorder, unspecified: Secondary | ICD-10-CM

## 2013-09-28 DIAGNOSIS — I5033 Acute on chronic diastolic (congestive) heart failure: Secondary | ICD-10-CM

## 2013-09-28 DIAGNOSIS — I5032 Chronic diastolic (congestive) heart failure: Secondary | ICD-10-CM | POA: Diagnosis not present

## 2013-09-28 LAB — CBC
HEMATOCRIT: 30.3 % — AB (ref 36.0–46.0)
HEMOGLOBIN: 10.2 g/dL — AB (ref 12.0–15.0)
MCH: 32.3 pg (ref 26.0–34.0)
MCHC: 33.7 g/dL (ref 30.0–36.0)
MCV: 95.9 fL (ref 78.0–100.0)
Platelets: 261 10*3/uL (ref 150–400)
RBC: 3.16 MIL/uL — AB (ref 3.87–5.11)
RDW: 13 % (ref 11.5–15.5)
WBC: 7.1 10*3/uL (ref 4.0–10.5)

## 2013-09-28 LAB — BASIC METABOLIC PANEL
BUN: 5 mg/dL — ABNORMAL LOW (ref 6–23)
CO2: 33 meq/L — AB (ref 19–32)
Calcium: 7.9 mg/dL — ABNORMAL LOW (ref 8.4–10.5)
Chloride: 94 mEq/L — ABNORMAL LOW (ref 96–112)
Creatinine, Ser: 0.69 mg/dL (ref 0.50–1.10)
GFR calc Af Amer: 84 mL/min — ABNORMAL LOW (ref >90)
GFR calc non Af Amer: 72 mL/min — ABNORMAL LOW (ref >90)
Glucose, Bld: 108 mg/dL — ABNORMAL HIGH (ref 70–99)
Potassium: 3.9 mEq/L (ref 3.7–5.3)
SODIUM: 135 meq/L — AB (ref 137–147)

## 2013-09-28 LAB — MAGNESIUM: MAGNESIUM: 1.8 mg/dL (ref 1.5–2.5)

## 2013-09-28 LAB — TROPONIN I: Troponin I: <0.3 ng/mL (ref <0.30)

## 2013-09-28 MED ORDER — AMIODARONE HCL 200 MG PO TABS
200.0000 mg | ORAL_TABLET | Freq: Every day | ORAL | Status: DC
  Filled 2013-09-28: qty 1

## 2013-09-28 MED ORDER — DILTIAZEM HCL ER COATED BEADS 120 MG PO CP24
120.0000 mg | ORAL_CAPSULE | Freq: Every day | ORAL | Status: DC
  Administered 2013-09-28: 120 mg via ORAL
  Filled 2013-09-28 (×2): qty 1

## 2013-09-28 MED ORDER — AMIODARONE HCL 200 MG PO TABS
200.0000 mg | ORAL_TABLET | Freq: Two times a day (BID) | ORAL | Status: DC
  Administered 2013-09-28 – 2013-09-30 (×5): 200 mg via ORAL
  Filled 2013-09-28 (×6): qty 1

## 2013-09-28 MED ORDER — MUPIROCIN 2 % EX OINT
1.0000 "application " | TOPICAL_OINTMENT | Freq: Two times a day (BID) | CUTANEOUS | Status: DC
  Administered 2013-09-28 – 2013-09-30 (×5): 1 via NASAL
  Filled 2013-09-28: qty 22

## 2013-09-28 MED ORDER — POTASSIUM CHLORIDE CRYS ER 20 MEQ PO TBCR
40.0000 meq | EXTENDED_RELEASE_TABLET | Freq: Once | ORAL | Status: AC
  Administered 2013-09-28: 40 meq via ORAL

## 2013-09-28 MED ORDER — PRAVASTATIN SODIUM 40 MG PO TABS
40.0000 mg | ORAL_TABLET | Freq: Every day | ORAL | Status: DC
  Administered 2013-09-28 – 2013-09-29 (×2): 40 mg via ORAL
  Filled 2013-09-28 (×3): qty 1

## 2013-09-28 MED ORDER — CHLORHEXIDINE GLUCONATE CLOTH 2 % EX PADS
6.0000 | MEDICATED_PAD | Freq: Every day | CUTANEOUS | Status: DC
  Administered 2013-09-28 – 2013-09-30 (×3): 6 via TOPICAL

## 2013-09-28 MED ORDER — LEVALBUTEROL HCL 0.63 MG/3ML IN NEBU
0.6300 mg | INHALATION_SOLUTION | Freq: Three times a day (TID) | RESPIRATORY_TRACT | Status: DC
  Administered 2013-09-28 – 2013-09-30 (×7): 0.63 mg via RESPIRATORY_TRACT
  Filled 2013-09-28 (×9): qty 3

## 2013-09-28 MED ORDER — POTASSIUM CHLORIDE CRYS ER 20 MEQ PO TBCR
20.0000 meq | EXTENDED_RELEASE_TABLET | Freq: Three times a day (TID) | ORAL | Status: DC
  Administered 2013-09-28 – 2013-09-29 (×4): 20 meq via ORAL
  Filled 2013-09-28 (×6): qty 1

## 2013-09-28 NOTE — Progress Notes (Signed)
Subjective: Pt converted to NSR Feel better No SOB  Objective: Vital signs in last 24 hours: Temp:  [98.1 F (36.7 C)-99 F (37.2 C)] 98.1 F (36.7 C) (06/22 0400) Pulse Rate:  [49-159] 66 (06/22 0500) Resp:  [13-43] 13 (06/22 0500) BP: (107-145)/(46-105) 116/53 mmHg (06/22 0500) SpO2:  [97 %-100 %] 100 % (06/22 0500) Weight:  [72.7 kg (160 lb 4.4 oz)-74 kg (163 lb 2.3 oz)] 72.7 kg (160 lb 4.4 oz) (06/22 0400) Weight change: 0.1 kg (3.5 oz) Last BM Date: 09/27/13  Intake/Output from previous day: 06/21 0701 - 06/22 0700 In: 781.2 [P.O.:240; I.V.:491.2; IV Piggyback:50] Out: 1520 [Urine:1520] Intake/Output this shift: Total I/O In: 405.9 [I.V.:355.9; IV Piggyback:50] Out: 1520 [Urine:1520]  General appearance: alert Resp: diminished breath sounds bibasilar Cardio: regular rate and rhythm GI: soft, non-tender; bowel sounds normal; no masses,  no organomegaly  Lab Results:  Recent Labs  09/27/13 1730 09/28/13 0600  WBC 8.6 7.1  HGB 10.7* 10.2*  HCT 32.0* 30.3*  PLT 274 261   BMET  Recent Labs  09/27/13 1730 09/28/13 0600  NA 136* 135*  K 3.3* 3.9  CL 96 94*  CO2 30 33*  GLUCOSE 102* 108*  BUN 6 5*  CREATININE 0.65 0.69  CALCIUM 7.8* 7.9*    Studies/Results: Dg Chest Port 1 View  09/27/2013   CLINICAL DATA:  Shortness of breath.  EXAM: PORTABLE CHEST - 1 VIEW  COMPARISON:  Chest x-ray 09/22/2013.  FINDINGS: There is a right-sided internal jugular central venous catheter with tip terminating in the mid superior vena cava. Lung volumes are low. Bibasilar opacities are favored to reflect subsegmental atelectasis, although underlying airspace consolidation is difficult to exclude. Small bilateral pleural effusions (left greater than right). Pulmonary vasculature is within normal limits. Heart size and mediastinal contours are normal. Atherosclerosis in the thoracic aorta.  IMPRESSION: 1. Low lung volumes with small bilateral pleural effusions and probable  bibasilar subsegmental atelectasis.   Electronically Signed   By: [name] M.D.   On: 09/27/2013 15:22    Medications: I have reviewed the patient's current medications.  Assessment/Plan: . difficile colitis, Shigella- Pancolitis on CT- patient cipro,dificid ; Flex sig /EGD results noted- infectionous colitis/ BX negative; EGD negative- diet- regular ;continue others  Urinary retention: better with flomax   Afib/ RVR episode- in setting of CHF; Cardiology consult noted-  On amiodarone gtt- converted to NSR within 24 hrs- agree with no anticoagulation; may switch to po amiodarone  - if ok with cardiology Acute flare of Diastolic CHF- continue IV lasix today- K ok Atelectasis- On chest xray- incentive spirometry  COPD- neb tx. O2 prn - change nebs to Xoponex Acute on chronic kidney disease/ ATN- improved - - Cr normal today.  Mild hyponatremia corrected/ hypokalemia- replace K- ok  Hypertension, coronary artery disease, history of diastolic dysfunction- BP is ok  Weakness/FTT: PT/OT - continue with PT. SNF d/c probable 1-2 days  [facility]    LOS: 12 days   HUSAIN,KARRAR 09/28/2013, 6:58 AM

## 2013-09-28 NOTE — Telephone Encounter (Signed)
New Message  Pt daughter called states that the pt is in ICU [facility] and asks if Dr. Irish Lack  can take care of her personnally. Please call back to discuss.

## 2013-09-28 NOTE — Progress Notes (Addendum)
Ok Edwards is Probation officer. Per MD note, "SNF d/c probable 1-2 days [facility]." CSW has received handoff from pt's previous CSW and will facilitate discharge when pt is medically ready.  Addendum: [facility] requested updated clinicals. Sent MD notes from today.  Ky Barban, MSW, [facility] Clinical Social Worker [phone]

## 2013-09-28 NOTE — Progress Notes (Signed)
  Echocardiogram 2D Echocardiogram has been performed.  BROWN, Laura Zuniga, Laura Zuniga

## 2013-09-28 NOTE — Progress Notes (Signed)
     SUBJECTIVE: Still with SOB. No pain.   BP 122/42  Pulse 66  Temp(Src) 98 F (36.7 C) (Oral)  Resp 30  Ht 5\' 1"  (1.549 m)  Wt 160 lb 4.4 oz (72.7 kg)  BMI 30.30 kg/m2  SpO2 99%  Intake/Output Summary (Last 24 hours) at 09/28/13 4696 Last data filed at 09/28/13 0800  Gross per 24 hour  Intake 834.58 ml  Output   1530 ml  Net -695.42 ml    PHYSICAL EXAM General: Well developed, well nourished, in no acute distress. Alert and oriented x 3.  Psych:  Good affect, responds appropriately Neck: No JVD. No masses noted.  Lungs: Clear bilaterally with no wheezes or rhonci noted.  Heart: RRR  Abdomen: Bowel sounds are present. Soft, non-tender.  Extremities: 1+ bilateral extremity edema.   LABS: Basic Metabolic Panel:  Recent Labs  09/27/13 1730 09/28/13 0600  NA 136* 135*  K 3.3* 3.9  CL 96 94*  CO2 30 33*  GLUCOSE 102* 108*  BUN 6 5*  CREATININE 0.65 0.69  CALCIUM 7.8* 7.9*  MG 1.3* 1.8   CBC:  Recent Labs  09/27/13 1730 09/28/13 0600  WBC 8.6 7.1  NEUTROABS 7.3  --   HGB 10.7* 10.2*  HCT 32.0* 30.3*  MCV 95.8 95.9  PLT 274 261   Cardiac Enzymes:  Recent Labs  09/27/13 1730 09/28/13 0200  TROPONINI <0.30 <0.30   Current Meds: . antiseptic oral rinse  15 mL Mouth Rinse q12n4p  . chlorhexidine  15 mL Mouth Rinse BID  . Chlorhexidine Gluconate Cloth  6 each Topical Q0600  . ciprofloxacin  400 mg Intravenous Q24H  . clopidogrel  75 mg Oral Daily  . cyanocobalamin  500 mcg Oral Daily  . escitalopram  5 mg Oral Daily  . ferrous sulfate  325 mg Oral Q breakfast  . fidaxomicin  200 mg Oral BID  . furosemide  80 mg Intravenous BID  . furosemide  40 mg Oral Once  . levalbuterol  0.63 mg Nebulization Q6H  . multivitamin with minerals  1 tablet Oral Daily  . mupirocin ointment  1 application Nasal BID  . pantoprazole  40 mg Oral BID  . potassium chloride  20 mEq Oral TID  . pravastatin  40 mg Oral q1800  . saccharomyces boulardii  250 mg Oral BID   . sodium bicarbonate  650 mg Oral TID  . sodium chloride  10-40 mL Intracatheter Q12H  . tamsulosin  0.4 mg Oral QPC breakfast  . vitamin C  500 mg Oral Daily  . vitamin E  400 Units Oral Daily    ASSESSMENT AND PLAN:  1. Acute on chronic diastolic CHF: She is overall 4 liters positive since admission but diuresing well now on IV Lasix. She is negative 700cc last 24 hours. Would continue IV Lasix today. Follow creatinine closely.   2. Atrial fibrillation with RVR: Converted to sinus overnight. BP stable. Will change to po amiodarone and po Cardizem. Not a good candidate for long term anti-coagulation.   MCALHANY,CHRISTOPHER  6/22/20158:11 AM

## 2013-09-28 NOTE — Telephone Encounter (Signed)
Called patient's daughter and she states that Dr.Crenshaw had seen her Mom in the hospital yesterday. Advised that Dr.Varanasi is assigned to the cath lab the next 3 days, not the unit, but I will send him this message. She is currently on the 2nd floor in the critical care unit 2H17.

## 2013-09-29 DIAGNOSIS — A0472 Enterocolitis due to Clostridium difficile, not specified as recurrent: Principal | ICD-10-CM

## 2013-09-29 LAB — BASIC METABOLIC PANEL
BUN: 31 mg/dL — ABNORMAL HIGH (ref 6–23)
BUN: 7 mg/dL (ref 6–23)
CHLORIDE: 94 meq/L — AB (ref 96–112)
CO2: 29 mEq/L (ref 19–32)
CO2: 34 mEq/L — ABNORMAL HIGH (ref 19–32)
CREATININE: 0.59 mg/dL (ref 0.50–1.10)
Calcium: 9.4 mg/dL (ref 8.4–10.5)
Calcium: 8 mg/dL — ABNORMAL LOW (ref 8.4–10.5)
Chloride: 96 mEq/L (ref 96–112)
Creatinine, Ser: 1.54 mg/dL — ABNORMAL HIGH (ref 0.50–1.10)
GFR calc Af Amer: 32 mL/min — ABNORMAL LOW (ref >90)
GFR calc Af Amer: 88 mL/min — ABNORMAL LOW (ref >90)
GFR calc non Af Amer: 28 mL/min — ABNORMAL LOW (ref >90)
GFR calc non Af Amer: 76 mL/min — ABNORMAL LOW (ref >90)
GLUCOSE: 104 mg/dL — AB (ref 70–99)
Glucose, Bld: 101 mg/dL — ABNORMAL HIGH (ref 70–99)
POTASSIUM: 3.7 meq/L (ref 3.7–5.3)
Potassium: 4.2 mEq/L (ref 3.7–5.3)
Sodium: 136 mEq/L — ABNORMAL LOW (ref 137–147)
Sodium: 136 mEq/L — ABNORMAL LOW (ref 137–147)

## 2013-09-29 LAB — COMPREHENSIVE METABOLIC PANEL
ALT: 21 U/L (ref 0–35)
AST: 34 U/L (ref 0–37)
Albumin: 2.5 g/dL — ABNORMAL LOW (ref 3.5–5.2)
Alkaline Phosphatase: 63 U/L (ref 39–117)
BILIRUBIN TOTAL: 0.4 mg/dL (ref 0.3–1.2)
BUN: 7 mg/dL (ref 6–23)
CALCIUM: 8.4 mg/dL (ref 8.4–10.5)
CHLORIDE: 94 meq/L — AB (ref 96–112)
CO2: 34 meq/L — AB (ref 19–32)
Creatinine, Ser: 0.63 mg/dL (ref 0.50–1.10)
GFR calc non Af Amer: 74 mL/min — ABNORMAL LOW (ref >90)
GFR, EST AFRICAN AMERICAN: 86 mL/min — AB (ref >90)
GLUCOSE: 113 mg/dL — AB (ref 70–99)
Potassium: 4.2 mEq/L (ref 3.7–5.3)
SODIUM: 135 meq/L — AB (ref 137–147)
Total Protein: 5.8 g/dL — ABNORMAL LOW (ref 6.0–8.3)

## 2013-09-29 LAB — CBC
HCT: 32.3 % — ABNORMAL LOW (ref 36.0–46.0)
HEMOGLOBIN: 10.7 g/dL — AB (ref 12.0–15.0)
MCH: 32.2 pg (ref 26.0–34.0)
MCHC: 33.1 g/dL (ref 30.0–36.0)
MCV: 97.3 fL (ref 78.0–100.0)
PLATELETS: 270 10*3/uL (ref 150–400)
RBC: 3.32 MIL/uL — ABNORMAL LOW (ref 3.87–5.11)
RDW: 13.3 % (ref 11.5–15.5)
WBC: 7.9 10*3/uL (ref 4.0–10.5)

## 2013-09-29 MED ORDER — FUROSEMIDE 40 MG PO TABS: 40.0000 mg | ORAL_TABLET | Freq: Every day | ORAL | Status: DC

## 2013-09-29 MED ORDER — POTASSIUM CHLORIDE CRYS ER 20 MEQ PO TBCR: 20.0000 meq | EXTENDED_RELEASE_TABLET | Freq: Every day | ORAL | Status: DC

## 2013-09-29 MED ORDER — SODIUM CHLORIDE 0.9 % IJ SOLN: 3.0000 mL | Freq: Two times a day (BID) | INTRAMUSCULAR | Status: DC

## 2013-09-29 MED ORDER — ALUM & MAG HYDROXIDE-SIMETH 200-200-20 MG/5ML PO SUSP: 30.0000 mL | Freq: Four times a day (QID) | ORAL | Status: DC | PRN

## 2013-09-29 MED ORDER — ACETAMINOPHEN 325 MG PO TABS: 650.0000 mg | ORAL_TABLET | Freq: Four times a day (QID) | ORAL | Status: DC | PRN

## 2013-09-29 MED ORDER — TAMSULOSIN HCL 0.4 MG PO CAPS: 0.4000 mg | ORAL_CAPSULE | Freq: Every day | ORAL | Status: DC

## 2013-09-29 MED ORDER — POTASSIUM CHLORIDE CRYS ER 20 MEQ PO TBCR
20.0000 meq | EXTENDED_RELEASE_TABLET | Freq: Every day | ORAL | Status: DC
  Administered 2013-09-30: 20 meq via ORAL
  Filled 2013-09-29: qty 1

## 2013-09-29 MED ORDER — ACETAMINOPHEN 650 MG RE SUPP: 650.0000 mg | Freq: Four times a day (QID) | RECTAL | Status: DC | PRN

## 2013-09-29 MED ORDER — DILTIAZEM HCL ER COATED BEADS 180 MG PO CP24: 180.0000 mg | ORAL_CAPSULE | Freq: Every day | ORAL | Status: DC

## 2013-09-29 MED ORDER — FUROSEMIDE 10 MG/ML IJ SOLN: 40.0000 mg | Freq: Two times a day (BID) | INTRAMUSCULAR | Status: DC

## 2013-09-29 MED ORDER — LEVALBUTEROL HCL 0.63 MG/3ML IN NEBU: 0.6300 mg | INHALATION_SOLUTION | Freq: Three times a day (TID) | RESPIRATORY_TRACT | Status: DC

## 2013-09-29 MED ORDER — DILTIAZEM HCL ER COATED BEADS 180 MG PO CP24
180.0000 mg | ORAL_CAPSULE | Freq: Every day | ORAL | Status: DC
  Administered 2013-09-29 – 2013-09-30 (×2): 180 mg via ORAL
  Filled 2013-09-29 (×2): qty 1

## 2013-09-29 MED ORDER — ALBUTEROL SULFATE (2.5 MG/3ML) 0.083% IN NEBU: 2.5000 mg | INHALATION_SOLUTION | Freq: Four times a day (QID) | RESPIRATORY_TRACT | Status: DC | PRN

## 2013-09-29 MED ORDER — AMIODARONE HCL 200 MG PO TABS: 200.0000 mg | ORAL_TABLET | Freq: Two times a day (BID) | ORAL | Status: DC

## 2013-09-29 MED ORDER — FIDAXOMICIN 200 MG PO TABS: 200.0000 mg | ORAL_TABLET | Freq: Two times a day (BID) | ORAL | Status: DC

## 2013-09-29 NOTE — Discharge Summary (Signed)
Physician Discharge Summary  Patient ID: Laura Zuniga MRN: [id number] DOB/AGE: November 01, 1917 80 y.o.  Admit date: 09/16/2013 Discharge date: 09/30/2013 Admission Diagnoses:  Discharge Diagnoses:  Principal Problem:   Colitis Active Problems:   HYPERLIPIDEMIA   DEPRESSION   HYPERTENSION   COPD (chronic obstructive pulmonary disease)   Diastolic CHF, chronic   Dehydration   Leukocytosis, unspecified   Colitis, infectious   ATN (acute tubular necrosis)   C. difficile colitis   Melena   D (diarrhea)   Atrial fibrillation with RVR   Acute on chronic diastolic CHF (congestive heart failure), NYHA class 4   Discharged Condition: good  Hospital Course: Problem #1: Pancolitis, status post flex sigmoidoscopy and EGD, biopsy negative-for ischemic colitis, CT scan of the abdomen consistent with pancolitis-Shigella and C. Difficile.-Treated with full course of Cipro; continue deficid for 3 more days. Problem #2: Acute renal failure secondary to ATN-resolved; renal function normal.-renal ultrasound no obstruction Problem #3 acute CHF diastolic heart failure-required IV diuresing; continue her Lasix and potassium; 2-D echo-EF normal Problem #4 A. fib with rapid ventricular response: Converted with amiodarone-normal sinus rhythm; continue on p.o. amiodarone and Cardizem; patient intermitten in atrial fibrillation rate control;  no anticoagulation because of recent colitis and fall risk.-Brief episode of A. fib less than 24 hours Problem #5: Anemia continue Iron supplement Problem #6: COPD continue Xopenex nebulizers p.r.n. Problem #7: Malnutrition moderate: Continue supplements Problem #8: Depression continue Lexapro Problem #9 urinary retention- improved with Flomax_CONTINUE THIS FOR ONE MONTH> Problem #10 hyponatremia resolved Patient is DO NOT RESUSCITATE Note: Patient need to monitor- renal function and potassium weekly. Follow up with cardiology- 1-2 weeks. Lasix dose can be adjusted  based on her leg edema and shortness of breath. Consults: cardiology and nephrology  Significant Diagnostic Studies: labs: WBC count 7.0 hemoglobin 10.2, creatinine 0.7 potassium 4.2, troponins negative elevated BNP, UA negative, microbiology: urine culture: negative and stool positive for C. difficile and radiology: CXR: CHF, CT scan: pan colitis and Ultrasound: renal ultrasound no obstruction  Treatments: IV hydration, antibiotics: Cipro and metronidazole, cardiac meds: furosemide and amiodarone and respiratory therapy: albuterol/atropine nebulizer  Discharge Exam: Blood pressure 118/77, pulse 98, temperature 97.5 F (36.4 C), temperature source Oral, resp. rate 21, height 5' 1.5" (1.562 m), weight 71.215 kg (157 lb), SpO2 99.00%. General appearance: alert Resp: diminished breath sounds bibasilar Cardio: regular rate and rhythm GI: soft, non-tender; bowel sounds normal; no masses,  no organomegaly Extremities: edema 1+  Disposition: skilled nursing facility     Medication List    STOP taking these medications       cefUROXime 500 MG tablet  Commonly known as:  CEFTIN     ciprofloxacin 500 MG tablet  Commonly known as:  CIPRO     ICAPS Caps     isosorbide mononitrate 60 MG 24 hr tablet  Commonly known as:  IMDUR     loperamide 1 MG/5ML solution  Commonly known as:  IMODIUM     metroNIDAZOLE 500 MG tablet  Commonly known as:  FLAGYL     polyethylene glycol packet  Commonly known as:  MIRALAX / GLYCOLAX     valsartan 160 MG tablet  Commonly known as:  DIOVAN     vitamin E 400 UNIT capsule      TAKE these medications       acetaminophen 500 MG tablet  Commonly known as:  TYLENOL  Take 500-1,000 mg by mouth every 6 (six) hours as needed for moderate pain or headache.  AEROCHAMBER PLUS FLO-VU Misc  1 each as needed.     albuterol (2.5 MG/3ML) 0.083% nebulizer solution  Commonly known as:  PROVENTIL  Take 3 mLs (2.5 mg total) by nebulization every 6 (six)  hours as needed for wheezing or shortness of breath.     alum & mag hydroxide-simeth 200-200-20 MG/5ML suspension  Commonly known as:  MAALOX/MYLANTA  Take 30 mLs by mouth every 6 (six) hours as needed for indigestion or heartburn (dyspepsia).     amiodarone 200 MG tablet  Commonly known as:  PACERONE  Take 1 tablet (200 mg total) by mouth 2 (two) times daily.     calcium-vitamin D 500-200 MG-UNIT per tablet  Commonly known as:  OSCAL WITH D  Take 1 tablet by mouth daily.     clopidogrel 75 MG tablet  Commonly known as:  PLAVIX  Take 1 tablet (75 mg total) by mouth daily.     Cranberry 250 MG Caps  Take 1 capsule by mouth daily.     cyanocobalamin 500 MCG tablet  Take 500 mcg by mouth daily.     diltiazem 180 MG 24 hr capsule  Commonly known as:  CARDIZEM CD  Take 1 capsule (180 mg total) by mouth daily.     escitalopram 10 MG tablet  Commonly known as:  LEXAPRO  Take 5 mg by mouth daily.     ferrous sulfate 325 (65 FE) MG tablet  Take 325 mg by mouth daily with breakfast.     fidaxomicin 200 MG Tabs tablet  Commonly known as:  DIFICID  Take 1 tablet (200 mg total) by mouth 2 (two) times daily. For 3 more days     furosemide 40 MG tablet  Commonly known as:  LASIX  Take 1 tablet (40 mg total) by mouth daily.     levalbuterol 0.63 MG/3ML nebulizer solution  Commonly known as:  XOPENEX  Take 3 mLs (0.63 mg total) by nebulization 3 (three) times daily.     meclizine 25 MG tablet  Commonly known as:  ANTIVERT  Take 25 mg by mouth 2 (two) times daily as needed for dizziness.     multivitamin with minerals Tabs tablet  Take 1 tablet by mouth daily.     pantoprazole 40 MG tablet  Commonly known as:  PROTONIX  Take 40 mg by mouth daily.     potassium chloride SA 20 MEQ tablet  Commonly known as:  K-DUR,KLOR-CON  Take 1 tablet (20 mEq total) by mouth daily.  Start taking on:  09/30/2013     pravastatin 40 MG tablet  Commonly known as:  PRAVACHOL  Take 40 mg by  mouth daily.     tamsulosin 0.4 MG Caps capsule  Commonly known as:  FLOMAX  Take 1 capsule (0.4 mg total) by mouth daily after breakfast.     vitamin C 500 MG tablet  Commonly known as:  ASCORBIC ACID  Take 500 mg by mouth daily.       Discharge planning time total: 45 min.   SignedWenda Low 09/29/2013, 10:57 PM

## 2013-09-29 NOTE — Progress Notes (Signed)
Pt transferred to [facility]. This CSW provided unit CSW a handoff. This CSW signing off.   Ky Barban, MSW, [facility] Clinical Social Worker [phone]

## 2013-09-29 NOTE — Progress Notes (Signed)
Pt transferred to room 3E28 from [place] via bed. Family at bedside. Assessment completed. Pt denies pain or concerns. Pt oriented to unit and room. Call bell in reach. Will continue to monitor pt closely.  Eulis Canner, RN

## 2013-09-29 NOTE — Progress Notes (Addendum)
     SUBJECTIVE: Feels weak. Mild SOB. No pain. LE edema better. Diarrhea improved-reports stools slightly more formed.   BP 126/62  Pulse 77  Temp(Src) 97.7 F (36.5 C) (Oral)  Resp 27  Ht 5\' 1"  (1.549 m)  Wt 158 lb 1.1 oz (71.7 kg)  BMI 29.88 kg/m2  SpO2 95%  Intake/Output Summary (Last 24 hours) at 09/29/13 1607 Last data filed at 09/29/13 0039  Gross per 24 hour  Intake 1066.8 ml  Output   2500 ml  Net -1433.2 ml    PHYSICAL EXAM General: Well developed, well nourished, in no acute distress. Alert and oriented x 3.  Psych:  Good affect, responds appropriately Neck: No JVD. No masses noted.  Lungs: Clear bilaterally with no wheezes or rhonci noted.  Heart: Irregular irregular with no murmurs noted. Abdomen: Bowel sounds are present. Soft, non-tender.  Extremities: Trace to 1+ bilateral lower extremity edema.   LABS: Basic Metabolic Panel:  Recent Labs  09/27/13 1730 09/28/13 0600 09/29/13 0400  NA 136* 135* 136*  K 3.3* 3.9 3.7  CL 96 94* 96  CO2 30 33* 29  GLUCOSE 102* 108* 101*  BUN 6 5* 31*  CREATININE 0.65 0.69 1.54*  CALCIUM 7.8* 7.9* 9.4  MG 1.3* 1.8  --    CBC:  Recent Labs  09/27/13 1730 09/28/13 0600  WBC 8.6 7.1  NEUTROABS 7.3  --   HGB 10.7* 10.2*  HCT 32.0* 30.3*  MCV 95.8 95.9  PLT 274 261   Cardiac Enzymes:  Recent Labs  09/27/13 1730 09/28/13 0200  TROPONINI <0.30 <0.30   Current Meds: . amiodarone  200 mg Oral BID  . antiseptic oral rinse  15 mL Mouth Rinse q12n4p  . chlorhexidine  15 mL Mouth Rinse BID  . Chlorhexidine Gluconate Cloth  6 each Topical Q0600  . ciprofloxacin  400 mg Intravenous Q24H  . clopidogrel  75 mg Oral Daily  . cyanocobalamin  500 mcg Oral Daily  . diltiazem  120 mg Oral Daily  . escitalopram  5 mg Oral Daily  . ferrous sulfate  325 mg Oral Q breakfast  . fidaxomicin  200 mg Oral BID  . furosemide  40 mg Oral Once  . levalbuterol  0.63 mg Nebulization TID  . multivitamin with minerals  1  tablet Oral Daily  . mupirocin ointment  1 application Nasal BID  . pantoprazole  40 mg Oral BID  . potassium chloride  20 mEq Oral TID  . pravastatin  40 mg Oral q1800  . saccharomyces boulardii  250 mg Oral BID  . sodium chloride  10-40 mL Intracatheter Q12H  . tamsulosin  0.4 mg Oral QPC breakfast  . vitamin C  500 mg Oral Daily  . vitamin E  400 Units Oral Daily   ASSESSMENT AND PLAN:  1. Acute on chronic diastolic CHF: She has diuresed well. Negative 2.1 liters last 48 hours. Creatinine bump today. Will hold Lasix today and follow renal function. Since she is back in sinus, may only require small dose of oral Lasix.   2. Atrial fibrillation with RVR: Converted back to atrial fib this am. Rates 110-120. Will continue amiodarone and Cardizem. Will increase Cardizem to 180 mg daily. Not a good candidate for long term anti-coagulation.   3. C diff colitis: per primary team  May be ok for transfer to telemetry unit later today.    MCALHANY,CHRISTOPHER  6/23/20158:52 AM

## 2013-09-29 NOTE — Progress Notes (Signed)
Updated [employer] case manager Ok Edwards that pt is not ready for discharge today, but may move to telemetry floor today, per MD note. Unit CSW will inform [employer] rep when pt is ready for discharge to [facility].   Ky Barban, MSW, [facility] Clinical Social Worker [phone]

## 2013-09-29 NOTE — Progress Notes (Signed)
Subjective: Pt feel some SOB wheezing  Objective: Vital signs in last 24 hours: Temp:  [97.7 F (36.5 C)-98.7 F (37.1 C)] 98.3 F (36.8 C) (06/23 0431) Pulse Rate:  [64-77] 73 (06/23 0423) Resp:  [13-43] 35 (06/23 0423) BP: (122-144)/(36-90) 144/41 mmHg (06/23 0420) SpO2:  [91 %-100 %] 98 % (06/23 0423) Weight:  [71.7 kg (158 lb 1.1 oz)] 71.7 kg (158 lb 1.1 oz) (06/23 0431) Weight change: -2.3 kg (-5 lb 1.1 oz) Last BM Date: 09/29/13  Intake/Output from previous day: 06/22 0701 - 06/23 0700 In: 1093.5 [P.O.:650; I.V.:243.5; IV Piggyback:200] Out: 2500 [Urine:2500] Intake/Output this shift: Total I/O In: 40 [I.V.:40] Out: 1000 [Urine:1000]  General appearance: alert Resp: wheezes bibasilar Cardio: regular rate and rhythm GI: soft, non-tender; bowel sounds normal; no masses,  no organomegaly  Lab Results:  Recent Labs  09/27/13 1730 09/28/13 0600  WBC 8.6 7.1  HGB 10.7* 10.2*  HCT 32.0* 30.3*  PLT 274 261   BMET  Recent Labs  09/28/13 0600 09/29/13 0400  NA 135* 136*  K 3.9 3.7  CL 94* 96  CO2 33* 29  GLUCOSE 108* 101*  BUN 5* 31*  CREATININE 0.69 1.54*  CALCIUM 7.9* 9.4    Studies/Results: Dg Chest Port 1 View  09/27/2013   CLINICAL DATA:  Shortness of breath.  EXAM: PORTABLE CHEST - 1 VIEW  COMPARISON:  Chest x-ray 09/22/2013.  FINDINGS: There is a right-sided internal jugular central venous catheter with tip terminating in the mid superior vena cava. Lung volumes are low. Bibasilar opacities are favored to reflect subsegmental atelectasis, although underlying airspace consolidation is difficult to exclude. Small bilateral pleural effusions (left greater than right). Pulmonary vasculature is within normal limits. Heart size and mediastinal contours are normal. Atherosclerosis in the thoracic aorta.  IMPRESSION: 1. Low lung volumes with small bilateral pleural effusions and probable bibasilar subsegmental atelectasis.   Electronically Signed   By: [name] M.D.   On: 09/27/2013 15:22    Medications: I have reviewed the patient's current medications.  Assessment/Plan: . difficile colitis, Shigella- Pancolitis on CT- patient cipro,dificid ; Flex sig /EGD results noted- infectionous colitis/ BX negative; EGD negative- diet- regular ;continue others -  Urinary retention: better with flomax  Afib/ RVR episode- in setting of CHF;  On amiodarone and cardizem po-  Remains in NSR ; agree with no anticoagulation; . Acute flare of Diastolic CHF- continue IV lasix today- K ok - Cr rising-concerning for over diuresing? decrease lasix dose- repeat Renal panel in after- recent ATN-  Echo pending Atelectasis- On chest xray- incentive spirometry  COPD- neb tx. O2 prn - change nebs to Xoponex  Acute on chronic kidney disease/ ATN- Mild hyponatremia corrected/ hypokalemia- replace K- ok  Hypertension, coronary artery disease, history of diastolic dysfunction- BP is ok  Weakness/FTT: PT/OT - continue with PT.  SNF d/c probable 1-2 days [facility]    LOS: 13 days   Laura Zuniga,Laura Zuniga 09/29/2013, 6:27 AM

## 2013-09-30 LAB — BASIC METABOLIC PANEL
BUN: 7 mg/dL (ref 6–23)
CALCIUM: 8.4 mg/dL (ref 8.4–10.5)
CO2: 34 mEq/L — ABNORMAL HIGH (ref 19–32)
Chloride: 95 mEq/L — ABNORMAL LOW (ref 96–112)
Creatinine, Ser: 0.68 mg/dL (ref 0.50–1.10)
GFR calc non Af Amer: 73 mL/min — ABNORMAL LOW (ref >90)
GFR, EST AFRICAN AMERICAN: 84 mL/min — AB (ref >90)
GLUCOSE: 104 mg/dL — AB (ref 70–99)
POTASSIUM: 4.3 meq/L (ref 3.7–5.3)
SODIUM: 136 meq/L — AB (ref 137–147)

## 2013-09-30 NOTE — Discharge Summary (Signed)
Addendum to discharge summary dictated earlier: Please see details of medications - in the previous discharge summary Please add this to the discharge summary dictated:  Diet: Regular Nutritional supplement: Ensure 3 times a day. Wenda Low

## 2013-10-01 ENCOUNTER — Non-Acute Institutional Stay (SKILLED_NURSING_FACILITY): Payer: Commercial Managed Care - HMO | Admitting: Adult Health

## 2013-10-01 ENCOUNTER — Encounter: Payer: Self-pay | Admitting: Adult Health

## 2013-10-01 ENCOUNTER — Telehealth: Payer: Self-pay | Admitting: Interventional Cardiology

## 2013-10-01 DIAGNOSIS — F3289 Other specified depressive episodes: Secondary | ICD-10-CM

## 2013-10-01 DIAGNOSIS — I509 Heart failure, unspecified: Secondary | ICD-10-CM

## 2013-10-01 DIAGNOSIS — I4891 Unspecified atrial fibrillation: Secondary | ICD-10-CM

## 2013-10-01 DIAGNOSIS — R339 Retention of urine, unspecified: Secondary | ICD-10-CM

## 2013-10-01 DIAGNOSIS — E785 Hyperlipidemia, unspecified: Secondary | ICD-10-CM

## 2013-10-01 DIAGNOSIS — F329 Major depressive disorder, single episode, unspecified: Secondary | ICD-10-CM

## 2013-10-01 DIAGNOSIS — D62 Acute posthemorrhagic anemia: Secondary | ICD-10-CM

## 2013-10-01 DIAGNOSIS — I5032 Chronic diastolic (congestive) heart failure: Secondary | ICD-10-CM

## 2013-10-01 DIAGNOSIS — J438 Other emphysema: Secondary | ICD-10-CM

## 2013-10-01 DIAGNOSIS — I10 Essential (primary) hypertension: Secondary | ICD-10-CM

## 2013-10-01 DIAGNOSIS — A0472 Enterocolitis due to Clostridium difficile, not specified as recurrent: Secondary | ICD-10-CM

## 2013-10-01 MED ORDER — FUROSEMIDE 40 MG PO TABS: 40.0000 mg | ORAL_TABLET | Freq: Two times a day (BID) | ORAL | Status: DC

## 2013-10-01 NOTE — Progress Notes (Signed)
Patient ID: Laura Zuniga, female   DOB: 06/05/1917, 80 y.o.   MRN: [id number]               PROGRESS NOTE  DATE: 10/01/2013  FACILITY: Nursing Home Location: [facility]  LEVEL OF CARE: SNF (31)  Acute Visit  CHIEF COMPLAINT:  Follow-up Hospitalization  HISTORY OF PRESENT ILLNESS: This is a 80 year old female who has been admitted to [facility] on 09/30/13 from [facility] with principal discharge diagnosis of C. Difficile Colitis. She has been admitted for a short-term rehabilitation.  REASSESSMENT OF ONGOING PROBLEM(S):  HTN: Pt 's HTN remains stable.  Denies CP, sob, DOE, pedal edema, headaches, dizziness or visual disturbances.  No complications from the medications currently being used.  Last BP : 132/78  CHF:The patient has worsening edema, denies sob, DOE, orthopnea, PNDs, palpitations or chest pain.  No complications form the medications being used.  ATRIAL FIBRILLATION: the patients atrial fibrillation remains stable.  The patient denies DOE, tachycardia, orthopnea, transient neurological sx, palpitations, & PNDs.  No complications noted from the medications currently being used.  PAST MEDICAL HISTORY : Reviewed.  No changes/see problem list  CURRENT MEDICATIONS: Reviewed per MAR/see medication list  REVIEW OF SYSTEMS:  GENERAL: no change in appetite, no fatigue, no weight changes, no fever, chills or weakness RESPIRATORY: no cough, SOB, DOE, wheezing, hemoptysis CARDIAC: no chest pain, or palpitations, +edema  GI: no abdominal pain, diarrhea, constipation, heart burn, nausea or vomiting  PHYSICAL EXAMINATION  GENERAL: no acute distress, normal body habitus EYES: conjunctivae normal, sclerae normal, normal eye lids NECK: supple, trachea midline, no neck masses, no thyroid tenderness, no thyromegaly LYMPHATICS: no LAN in the neck, no supraclavicular LAN RESPIRATORY: breathing is even & unlabored, BS CTAB CARDIAC: RRR, no murmur,no extra  heart sounds, BUE edema 2+, BLE edema 3+ GI: abdomen soft, normal BS, no masses, no tenderness, no hepatomegaly, no splenomegaly EXTREMITIES: able to move all 4 extremities; uses walker PSYCHIATRIC: the patient is alert & oriented to person, affect & behavior appropriate  LABS/RADIOLOGY: Labs reviewed: Basic Metabolic Panel:  Recent Labs  09/16/13 1858  09/21/13 0611 09/22/13 0640 09/23/13 0510  09/27/13 1730 09/28/13 0600  09/29/13 1400 09/29/13 1755 09/30/13 0545  NA  --   < > 131* 133* 137  < > 136* 135*  < > 136* 135* 136*  K  --   < > 3.6* 3.1* 3.4*  < > 3.3* 3.9  < > 4.2 4.2 4.3  CL  --   < > 96 100 104  < > 96 94*  < > 94* 94* 95*  CO2  --   < > 18* 18* 21  < > 30 33*  < > 34* 34* 34*  GLUCOSE  --   < > 87 91 84  < > 102* 108*  < > 104* 113* 104*  BUN  --   < > 35* 31* 23  < > 6 5*  < > 7 7 7   CREATININE  --   < > 1.98* 1.58* 1.09  < > 0.65 0.69  < > 0.59 0.63 0.68  CALCIUM  --   < > 8.0* 8.1* 8.0*  < > 7.8* 7.9*  < > 8.0* 8.4 8.4  MG 1.6  --   --   --   --   --  1.3* 1.8  --   --   --   --   PHOS 2.7  --  3.8 3.6 2.7  --   --   --   --   --   --   --   < > = values in this interval not displayed. Liver Function Tests:  Recent Labs  09/17/13 0844  09/23/13 0510 09/27/13 1730 09/29/13 1755  AST 18  --   --  36 34  ALT 9  --   --  21 21  ALKPHOS 71  --   --  56 63  BILITOT 0.5  --   --  0.3 0.4  PROT 5.9*  --   --  5.6* 5.8*  ALBUMIN 2.7*  < > 2.1* 2.4* 2.5*  < > = values in this interval not displayed.  Recent Labs  09/16/13 1304  LIPASE 13   CBC:  Recent Labs  09/16/13 1304  09/27/13 1730 09/28/13 0600 09/29/13 1755  WBC 13.3*  < > 8.6 7.1 7.9  NEUTROABS 10.4*  --  7.3  --   --   HGB 10.5*  < > 10.7* 10.2* 10.7*  HCT 31.0*  < > 32.0* 30.3* 32.3*  MCV 95.4  < > 95.8 95.9 97.3  PLT 197  < > 274 261 270  < > = values in this interval not displayed.  Cardiac Enzymes:  Recent Labs  09/16/13 1304 09/27/13 1730 09/28/13 0200  TROPONINI <0.30  <0.30 <0.30     ASSESSMENT/PLAN:  C. Difficile Colitis - continue Dificid X 3 more days Hyperlipidemia - continue Pravastatin Depression - continue Lexapro Hypertension - well-controlled; continue Diltiazem COPD - stable; continue Xopenex neb. PRN Chronic diastolic CHF -recently increased Lasix to 4o mg BID by Dr. Christ Kick Atrial Fibrillation - rate-controlled; continue Amiodarone; no anticoagulation due to recent colitis and fall risk Anemia, acute blood loss - continue Iron supplementation Urinary retention - continue Flomax X 1 month   CPT CODE: 61683  Monina Vargas - NP [facility] [phone]

## 2013-10-01 NOTE — Telephone Encounter (Signed)
Per Dr. Irish Lack pt should increase lasix to 40 mg BID and have Shively draw Bmet in 1 week.

## 2013-10-01 NOTE — Telephone Encounter (Signed)
New message  Pt was on 120 mg of lasix.Daughter states that  Dr Lorenda Hatchet now only has her on 40 mg of lasix.Marland Kitchen pt has began to swell since she has been at [facility] which is her new living facility. Please call back to discuss.

## 2013-10-01 NOTE — Telephone Encounter (Signed)
See other telephone note.  

## 2013-10-01 NOTE — Telephone Encounter (Signed)
Order faxed. Pts daughter aware as she is at the facility with her mother.

## 2013-10-01 NOTE — Telephone Encounter (Signed)
Spoke with Heron Sabins pts nurse at [facility]. Order needs to be faxed to [phone]

## 2013-10-01 NOTE — Telephone Encounter (Signed)
To Dr. Varanasi, please advise.  

## 2013-10-01 NOTE — Telephone Encounter (Signed)
Increased Lasix dose may be indicated.  I cannot call orders in to [facility].  Will send note to Dr. Lysle Rubens since he is managing her there.  Karrar, please see the string of notes.  The patient's daughter is reporting that the patient is on Less lasix and having swelling.

## 2013-10-01 NOTE — Telephone Encounter (Signed)
Laura Zuniga at 10/01/2013 11:12 AM     Status: Signed        Patient's daughter called in and is concerned with the fluid her mother is obtaning. Her hands and feet are VERY swollen. She feels like the lasix needs to be increased, she is currently taking 40mg . Please call and advise. She was moved to [facility].

## 2013-10-01 NOTE — Telephone Encounter (Signed)
Patient's daughter called in and is concerned with the fluid her mother is obtaning. Her hands and feet are VERY swollen. She feels like the lasix needs to be increased, she is currently taking 40mg . Please call and advise. She was moved to [facility].

## 2013-10-02 ENCOUNTER — Non-Acute Institutional Stay (SKILLED_NURSING_FACILITY): Payer: Commercial Managed Care - HMO | Admitting: Internal Medicine

## 2013-10-02 DIAGNOSIS — I10 Essential (primary) hypertension: Secondary | ICD-10-CM

## 2013-10-02 DIAGNOSIS — I4891 Unspecified atrial fibrillation: Secondary | ICD-10-CM

## 2013-10-02 DIAGNOSIS — I5032 Chronic diastolic (congestive) heart failure: Secondary | ICD-10-CM

## 2013-10-02 DIAGNOSIS — I509 Heart failure, unspecified: Secondary | ICD-10-CM

## 2013-10-02 DIAGNOSIS — J449 Chronic obstructive pulmonary disease, unspecified: Secondary | ICD-10-CM

## 2013-10-03 ENCOUNTER — Telehealth: Payer: Self-pay | Admitting: Nurse Practitioner

## 2013-10-03 ENCOUNTER — Encounter (HOSPITAL_COMMUNITY): Payer: Self-pay | Admitting: Emergency Medicine

## 2013-10-03 ENCOUNTER — Emergency Department (HOSPITAL_COMMUNITY): Payer: Medicare HMO

## 2013-10-03 ENCOUNTER — Inpatient Hospital Stay (HOSPITAL_COMMUNITY)
Admission: EM | Admit: 2013-10-03 | Discharge: 2013-10-12 | DRG: 293 | Disposition: A | Discharge status: Skilled Nursing Facility | Payer: Medicare HMO | Attending: Internal Medicine | Admitting: Internal Medicine

## 2013-10-03 DIAGNOSIS — F0280 Dementia in other diseases classified elsewhere without behavioral disturbance: Secondary | ICD-10-CM | POA: Diagnosis present

## 2013-10-03 DIAGNOSIS — J4489 Other specified chronic obstructive pulmonary disease: Secondary | ICD-10-CM | POA: Diagnosis present

## 2013-10-03 DIAGNOSIS — N181 Chronic kidney disease, stage 1: Secondary | ICD-10-CM | POA: Diagnosis present

## 2013-10-03 DIAGNOSIS — I5033 Acute on chronic diastolic (congestive) heart failure: Principal | ICD-10-CM

## 2013-10-03 DIAGNOSIS — K5730 Diverticulosis of large intestine without perforation or abscess without bleeding: Secondary | ICD-10-CM | POA: Diagnosis present

## 2013-10-03 DIAGNOSIS — E8770 Fluid overload, unspecified: Secondary | ICD-10-CM

## 2013-10-03 DIAGNOSIS — J449 Chronic obstructive pulmonary disease, unspecified: Secondary | ICD-10-CM | POA: Diagnosis present

## 2013-10-03 DIAGNOSIS — F32A Depression, unspecified: Secondary | ICD-10-CM | POA: Diagnosis present

## 2013-10-03 DIAGNOSIS — E785 Hyperlipidemia, unspecified: Secondary | ICD-10-CM | POA: Diagnosis present

## 2013-10-03 DIAGNOSIS — I4891 Unspecified atrial fibrillation: Secondary | ICD-10-CM | POA: Diagnosis present

## 2013-10-03 DIAGNOSIS — K219 Gastro-esophageal reflux disease without esophagitis: Secondary | ICD-10-CM | POA: Diagnosis present

## 2013-10-03 DIAGNOSIS — I480 Paroxysmal atrial fibrillation: Secondary | ICD-10-CM | POA: Diagnosis present

## 2013-10-03 DIAGNOSIS — I2510 Atherosclerotic heart disease of native coronary artery without angina pectoris: Secondary | ICD-10-CM

## 2013-10-03 DIAGNOSIS — Z8249 Family history of ischemic heart disease and other diseases of the circulatory system: Secondary | ICD-10-CM

## 2013-10-03 DIAGNOSIS — R5381 Other malaise: Secondary | ICD-10-CM | POA: Diagnosis present

## 2013-10-03 DIAGNOSIS — Z9181 History of falling: Secondary | ICD-10-CM

## 2013-10-03 DIAGNOSIS — F329 Major depressive disorder, single episode, unspecified: Secondary | ICD-10-CM | POA: Diagnosis present

## 2013-10-03 DIAGNOSIS — I5032 Chronic diastolic (congestive) heart failure: Secondary | ICD-10-CM

## 2013-10-03 DIAGNOSIS — Z880 Allergy status to penicillin: Secondary | ICD-10-CM

## 2013-10-03 DIAGNOSIS — H3530 Unspecified macular degeneration: Secondary | ICD-10-CM | POA: Diagnosis present

## 2013-10-03 DIAGNOSIS — Z882 Allergy status to sulfonamides status: Secondary | ICD-10-CM

## 2013-10-03 DIAGNOSIS — I509 Heart failure, unspecified: Secondary | ICD-10-CM

## 2013-10-03 DIAGNOSIS — F0390 Unspecified dementia without behavioral disturbance: Secondary | ICD-10-CM | POA: Diagnosis present

## 2013-10-03 DIAGNOSIS — F411 Generalized anxiety disorder: Secondary | ICD-10-CM | POA: Diagnosis present

## 2013-10-03 DIAGNOSIS — I10 Essential (primary) hypertension: Secondary | ICD-10-CM | POA: Diagnosis present

## 2013-10-03 DIAGNOSIS — I482 Chronic atrial fibrillation, unspecified: Secondary | ICD-10-CM

## 2013-10-03 DIAGNOSIS — I129 Hypertensive chronic kidney disease with stage 1 through stage 4 chronic kidney disease, or unspecified chronic kidney disease: Secondary | ICD-10-CM | POA: Diagnosis present

## 2013-10-03 DIAGNOSIS — F3289 Other specified depressive episodes: Secondary | ICD-10-CM | POA: Diagnosis present

## 2013-10-03 DIAGNOSIS — Z7902 Long term (current) use of antithrombotics/antiplatelets: Secondary | ICD-10-CM

## 2013-10-03 DIAGNOSIS — I498 Other specified cardiac arrhythmias: Secondary | ICD-10-CM | POA: Diagnosis present

## 2013-10-03 DIAGNOSIS — M7989 Other specified soft tissue disorders: Secondary | ICD-10-CM

## 2013-10-03 DIAGNOSIS — D638 Anemia in other chronic diseases classified elsewhere: Secondary | ICD-10-CM | POA: Diagnosis present

## 2013-10-03 LAB — CLOSTRIDIUM DIFFICILE BY PCR: Toxigenic C. Difficile by PCR: NEGATIVE

## 2013-10-03 LAB — CBC WITH DIFFERENTIAL/PLATELET
Basophils Absolute: 0 10*3/uL (ref 0.0–0.1)
Basophils Relative: 0 % (ref 0–1)
Eosinophils Absolute: 0.1 10*3/uL (ref 0.0–0.7)
Eosinophils Relative: 1 % (ref 0–5)
HCT: 31.2 % — ABNORMAL LOW (ref 36.0–46.0)
Hemoglobin: 10.2 g/dL — ABNORMAL LOW (ref 12.0–15.0)
Lymphocytes Relative: 21 % (ref 12–46)
Lymphs Abs: 1.7 10*3/uL (ref 0.7–4.0)
MCH: 32.8 pg (ref 26.0–34.0)
MCHC: 32.7 g/dL (ref 30.0–36.0)
MCV: 100.3 fL — ABNORMAL HIGH (ref 78.0–100.0)
Monocytes Absolute: 1 10*3/uL (ref 0.1–1.0)
Monocytes Relative: 13 % — ABNORMAL HIGH (ref 3–12)
Neutro Abs: 5.2 10*3/uL (ref 1.7–7.7)
Neutrophils Relative %: 65 % (ref 43–77)
Platelets: 245 10*3/uL (ref 150–400)
RBC: 3.11 MIL/uL — ABNORMAL LOW (ref 3.87–5.11)
RDW: 13.5 % (ref 11.5–15.5)
WBC: 8 10*3/uL (ref 4.0–10.5)

## 2013-10-03 LAB — BASIC METABOLIC PANEL
BUN: 11 mg/dL (ref 6–23)
CO2: 33 mEq/L — ABNORMAL HIGH (ref 19–32)
Calcium: 8.9 mg/dL (ref 8.4–10.5)
Chloride: 93 mEq/L — ABNORMAL LOW (ref 96–112)
Creatinine, Ser: 0.81 mg/dL (ref 0.50–1.10)
GFR calc Af Amer: 70 mL/min — ABNORMAL LOW (ref >90)
GFR calc non Af Amer: 60 mL/min — ABNORMAL LOW (ref >90)
Glucose, Bld: 108 mg/dL — ABNORMAL HIGH (ref 70–99)
Potassium: 4.6 mEq/L (ref 3.7–5.3)
Sodium: 137 mEq/L (ref 137–147)

## 2013-10-03 LAB — TROPONIN I: Troponin I: <0.3 ng/mL (ref <0.30)

## 2013-10-03 LAB — PRO B NATRIURETIC PEPTIDE: Pro B Natriuretic peptide (BNP): 3462 pg/mL — ABNORMAL HIGH (ref 0–450)

## 2013-10-03 MED ORDER — SODIUM CHLORIDE 0.9 % IJ SOLN
3.0000 mL | Freq: Two times a day (BID) | INTRAMUSCULAR | Status: DC
  Administered 2013-10-03: 3 mL via INTRAVENOUS
  Administered 2013-10-04: 10:00:00 via INTRAVENOUS
  Administered 2013-10-04 – 2013-10-11 (×15): 3 mL via INTRAVENOUS

## 2013-10-03 MED ORDER — ALBUTEROL SULFATE (2.5 MG/3ML) 0.083% IN NEBU: 2.5000 mg | INHALATION_SOLUTION | Freq: Four times a day (QID) | RESPIRATORY_TRACT | Status: DC | PRN

## 2013-10-03 MED ORDER — POTASSIUM CHLORIDE 20 MEQ/15ML (10%) PO LIQD
20.0000 meq | Freq: Once | ORAL | Status: AC
  Administered 2013-10-03: 20 meq via ORAL
  Filled 2013-10-03: qty 15

## 2013-10-03 MED ORDER — FUROSEMIDE 10 MG/ML IJ SOLN
80.0000 mg | Freq: Two times a day (BID) | INTRAMUSCULAR | Status: DC
  Administered 2013-10-03 – 2013-10-05 (×4): 80 mg via INTRAVENOUS
  Filled 2013-10-03 (×6): qty 8

## 2013-10-03 MED ORDER — FUROSEMIDE 10 MG/ML IJ SOLN
80.0000 mg | Freq: Once | INTRAMUSCULAR | Status: AC
  Administered 2013-10-03: 80 mg via INTRAVENOUS
  Filled 2013-10-03: qty 8

## 2013-10-03 MED ORDER — AMIODARONE HCL 200 MG PO TABS
200.0000 mg | ORAL_TABLET | Freq: Two times a day (BID) | ORAL | Status: DC
  Administered 2013-10-04 (×2): 200 mg via ORAL
  Filled 2013-10-03 (×5): qty 1

## 2013-10-03 MED ORDER — MECLIZINE HCL 25 MG PO TABS
25.0000 mg | ORAL_TABLET | Freq: Two times a day (BID) | ORAL | Status: DC | PRN
  Filled 2013-10-03: qty 1

## 2013-10-03 MED ORDER — DILTIAZEM HCL ER COATED BEADS 180 MG PO CP24
180.0000 mg | ORAL_CAPSULE | Freq: Every day | ORAL | Status: DC
  Administered 2013-10-04 – 2013-10-05 (×2): 180 mg via ORAL
  Filled 2013-10-03 (×4): qty 1

## 2013-10-03 MED ORDER — CLOPIDOGREL BISULFATE 75 MG PO TABS
75.0000 mg | ORAL_TABLET | Freq: Every day | ORAL | Status: DC
  Administered 2013-10-04 – 2013-10-12 (×9): 75 mg via ORAL
  Filled 2013-10-03 (×11): qty 1

## 2013-10-03 MED ORDER — TAMSULOSIN HCL 0.4 MG PO CAPS
0.4000 mg | ORAL_CAPSULE | Freq: Every day | ORAL | Status: DC
  Administered 2013-10-04 – 2013-10-05 (×2): 0.4 mg via ORAL
  Filled 2013-10-03 (×4): qty 1

## 2013-10-03 MED ORDER — HEPARIN SODIUM (PORCINE) 5000 UNIT/ML IJ SOLN
5000.0000 [IU] | Freq: Three times a day (TID) | INTRAMUSCULAR | Status: DC
  Administered 2013-10-03 – 2013-10-12 (×27): 5000 [IU] via SUBCUTANEOUS
  Filled 2013-10-03 (×29): qty 1

## 2013-10-03 MED ORDER — POTASSIUM CHLORIDE CRYS ER 20 MEQ PO TBCR
40.0000 meq | EXTENDED_RELEASE_TABLET | Freq: Once | ORAL | Status: AC
  Administered 2013-10-03: 10 meq via ORAL
  Filled 2013-10-03: qty 2

## 2013-10-03 MED ORDER — ESCITALOPRAM OXALATE 5 MG PO TABS
5.0000 mg | ORAL_TABLET | Freq: Every day | ORAL | Status: DC
  Administered 2013-10-03 – 2013-10-12 (×10): 5 mg via ORAL
  Filled 2013-10-03 (×10): qty 1

## 2013-10-03 NOTE — ED Provider Notes (Signed)
CSN: [id number]     Arrival date & time 10/03/13  1111 History   First MD Initiated Contact with Patient 10/03/13 1112     Chief Complaint  Patient presents with  . Shortness of Breath     (Consider location/radiation/quality/duration/timing/severity/associated sxs/prior Treatment) HPI  [name] with dyspnea. Past hx of HTN, chronic diastolic heart failure, CKD, afib, GERD, hx of diverticulosis, HLD and COPD. Recent admit for pseudomembranous colitis and developed volume overload/new onset afib in hospital. Cites worsening dyspnea over past 24-36 hours. Was having some chest pressure substernal ly earlier this morning, but has since resolved. No cough. SOB worse when laying back. LE edema. She thinks this has been about stable.    Past Medical History  Diagnosis Date  . CHF (congestive heart failure)   . GERD (gastroesophageal reflux disease)   . Hypertension   . Diverticulitis   . UTI (urinary tract infection)   . Blockage of coronary artery of heart 2007    never had cardiac cath, CAD dx by positive ant. wall defect on Nuc. study.  no cath due to high risk.  . Macular degeneration    Past Surgical History  Procedure Laterality Date  . Cholecystectomy    . Appendectomy    . Vaginal hysterectomy    . Eye surgery    . Esophagogastroduodenoscopy N/A 09/22/2013    Procedure: ESOPHAGOGASTRODUODENOSCOPY (EGD);  Surgeon: Lear Ng, MD;  Location[facility] ENDOSCOPY;  Service: Endoscopy;  Laterality: N/A;  . Flexible sigmoidoscopy N/A 09/22/2013    Procedure: FLEXIBLE SIGMOIDOSCOPY;  Surgeon: Lear Ng, MD;  Location: [place];  Service: Endoscopy;  Laterality: N/A;  . Flexible sigmoidoscopy N/A 09/22/2013    Procedure: FLEXIBLE SIGMOIDOSCOPY;  Surgeon: Lear Ng, MD;  Location: [place];  Service: Endoscopy;  Laterality: N/A;  unprepped/ egd first  . Esophagogastroduodenoscopy N/A 09/22/2013    Procedure: ESOPHAGOGASTRODUODENOSCOPY (EGD);  Surgeon: Lear Ng, MD;  Location: [facility] ENDOSCOPY;  Service: Endoscopy;  Laterality: N/A;   Family History  Problem Relation Age of Onset  . Heart attack Father 82  . Heart attack Sister   . Cancer Brother   . Cancer Brother   . Cirrhosis Brother   . Heart disease Brother   . Cancer Brother   . COPD Brother   . COPD Sister   . Cancer Sister   . Cancer Sister    History  Substance Use Topics  . Smoking status: Never Smoker   . Smokeless tobacco: Not on file  . Alcohol Use: No   OB History   Grav Para Term Preterm Abortions TAB SAB Ect Mult Living                 Review of Systems  All systems reviewed and negative, other than as noted in HPI.   Allergies  Penicillins and Sulfonamide derivatives  Home Medications   Prior to Admission medications   Medication Sig Start Date End Date Taking? Authorizing Matisse Salais  acetaminophen (TYLENOL) 500 MG tablet Take 500-1,000 mg by mouth every 6 (six) hours as needed for moderate pain or headache.   Yes Historical Blakelyn Dinges, MD  albuterol (PROVENTIL) (2.5 MG/3ML) 0.083% nebulizer solution Take 3 mLs (2.5 mg total) by nebulization every 6 (six) hours as needed for wheezing or shortness of breath. 09/29/13  Yes Wenda Low, MD  alum & mag hydroxide-simeth (MAALOX/MYLANTA) 200-200-20 MG/5ML suspension Take 30 mLs by mouth every 6 (six) hours as needed for indigestion or heartburn (dyspepsia). 09/29/13  Yes  Wenda Low, MD  amiodarone (PACERONE) 200 MG tablet Take 1 tablet (200 mg total) by mouth 2 (two) times daily. 09/29/13  Yes Wenda Low, MD  calcium-vitamin D (OSCAL WITH D) 500-200 MG-UNIT per tablet Take 1 tablet by mouth daily.   Yes Historical Pennie Vanblarcom, MD  clopidogrel (PLAVIX) 75 MG tablet Take 1 tablet (75 mg total) by mouth daily. 07/13/13  Yes Jettie Booze, MD  Cranberry 250 MG CAPS Take 1 capsule by mouth daily.   Yes Historical Cari Burgo, MD  cyanocobalamin 500 MCG tablet Take 500 mcg by mouth daily.   Yes Historical Stephaie Dardis, MD   diltiazem (CARDIZEM CD) 180 MG 24 hr capsule Take 1 capsule (180 mg total) by mouth daily. 09/29/13  Yes Wenda Low, MD  escitalopram (LEXAPRO) 10 MG tablet Take 5 mg by mouth daily.   Yes Historical Sidnee Gambrill, MD  ferrous sulfate 325 (65 FE) MG tablet Take 325 mg by mouth daily with breakfast.   Yes Historical Angella Montas, MD  fidaxomicin (DIFICID) 200 MG TABS tablet Take 1 tablet (200 mg total) by mouth 2 (two) times daily. For 3 more days 09/29/13  Yes Wenda Low, MD  furosemide (LASIX) 40 MG tablet Take 40 mg by mouth daily.   Yes Historical Juno Alers, MD  levalbuterol (XOPENEX) 0.63 MG/3ML nebulizer solution Take 3 mLs (0.63 mg total) by nebulization 3 (three) times daily. 09/29/13  Yes Wenda Low, MD  meclizine (ANTIVERT) 25 MG tablet Take 25 mg by mouth 2 (two) times daily as needed for dizziness.  09/07/13  Yes Historical Euclid Cassetta, MD  Multiple Vitamin (MULITIVITAMIN WITH MINERALS) TABS Take 1 tablet by mouth daily.   Yes Historical Chrysta Fulcher, MD  pantoprazole (PROTONIX) 40 MG tablet Take 40 mg by mouth daily.   Yes Historical Aritha Huckeba, MD  potassium chloride SA (K-DUR,KLOR-CON) 20 MEQ tablet Take 1 tablet (20 mEq total) by mouth daily. 09/30/13  Yes Wenda Low, MD  pravastatin (PRAVACHOL) 40 MG tablet Take 40 mg by mouth daily.   Yes Historical Arlett Goold, MD  tamsulosin (FLOMAX) 0.4 MG CAPS capsule Take 1 capsule (0.4 mg total) by mouth daily after breakfast. 09/29/13  Yes Wenda Low, MD  vitamin C (ASCORBIC ACID) 500 MG tablet Take 500 mg by mouth daily.   Yes Historical Agastya Meister, MD   BP 129/49  Pulse 63  Temp(Src) 98.2 F (36.8 C) (Oral)  Resp 33  SpO2 100% Physical Exam  Nursing note and vitals reviewed. Constitutional: She appears well-developed and well-nourished. No distress.  HENT:  Head: Normocephalic and atraumatic.  Eyes: Conjunctivae are normal. Right eye exhibits no discharge. Left eye exhibits no discharge.  Neck: Neck supple.  Cardiovascular: Normal rate, regular  rhythm and normal heart sounds.  Exam reveals no gallop and no friction rub.   No murmur heard. Pulmonary/Chest:  Tachypnea. Crackles b/l bases.   Abdominal: Soft. She exhibits no distension. There is no tenderness.  Musculoskeletal: She exhibits edema. She exhibits no tenderness.  Symmetric pitting LE edema  Neurological: She is alert.  Skin: Skin is warm and dry.  Psychiatric: She has a normal mood and affect. Her behavior is normal. Thought content normal.    ED Course  Procedures (including critical care time) Labs Review Labs Reviewed  BASIC METABOLIC PANEL - Abnormal; Notable for the following:    Chloride 93 (*)    CO2 33 (*)    Glucose, Bld 108 (*)    GFR calc non Af Amer 60 (*)    GFR calc Af Amer 70 (*)  All other components within normal limits  PRO B NATRIURETIC PEPTIDE - Abnormal; Notable for the following:    Pro B Natriuretic peptide (BNP) 3462.0 (*)    All other components within normal limits  CBC WITH DIFFERENTIAL - Abnormal; Notable for the following:    RBC 3.11 (*)    Hemoglobin 10.2 (*)    HCT 31.2 (*)    MCV 100.3 (*)    Monocytes Relative 13 (*)    All other components within normal limits  CLOSTRIDIUM DIFFICILE BY PCR  TROPONIN I    Imaging Review Dg Chest 2 View  10/03/2013   CLINICAL DATA:  dyspnea dyspnea  EXAM: CHEST - 2 VIEW  COMPARISON:  09/27/2013  FINDINGS: The central line has been removed. Persistent pleural effusions left greater than right with adjacent consolidation/ atelectasis at the lung bases. Mild central pulmonary vascular congestion. Mild cardiomegaly. Surgical clips in the upper abdomen. Patchy aorta aortic calcifications.  IMPRESSION: 1. Little convincing change since previous exam in bilateral infiltrates/edema, effusions, and cardiomegaly.   Electronically Signed   By: Arne Cleveland M.D.   On: 10/03/2013 13:25     EKG Interpretation   Date/Time:  Saturday October 03 2013 11:22:33 EDT Ventricular Rate:  58 PR Interval:     QRS Duration: 85 QT Interval:  467 QTC Calculation: 459 R Axis:   43 Text Interpretation:  Atrial fibrillation versus sinus with pac Low  voltage, extremity and precordial leads Non-specific ST-t changes No  significant change since last tracing Reconfirmed by Vidor  MD, [place]  ([id number]) on 10/03/2013 1:51:50 PM      MDM   Final diagnoses:  Acute on chronic diastolic CHF (congestive heart failure), NYHA class 4  Hypervolemia, unspecified hypervolemia type     [name] with dyspnea. Hx of chronic diastolic HF and new onset afib, but from review of records appears to have converted to sinus prior to recent discharge. Suspect in sinus rhythm currently. Appears to be so on monitor. EKG with low voltage and difficult to definitively discern p waves. Suspect sinus rhythm though with a PAC versus atrial fibrillation. On diltiazem, amiodarone. Rate fine. BP fine.  Volume overloaded. CXR with vascular congestion, persistent effusions.  Weight recorded as 138 two days ago. 145 today. On lasix. Additional ordered. Pt still breathing 30/min. Unfortunately I feel she needs admission back to hospital.     Virgel Manifold, MD 10/06/13 [phone]

## 2013-10-03 NOTE — Clinical Social Work Placement (Addendum)
Clinical Social Work Department CLINICAL SOCIAL WORK PLACEMENT NOTE 09/30/2013  Patient:  Laura Zuniga, Laura Zuniga  Account Number:  [id number] [place] date:  09/16/2013  Clinical Social Worker:  Kemper Durie, [place]  Date/time:  09/18/2013 10:00 AM  Clinical Social Work is seeking post-discharge placement for this patient at the following level of care:   [place]   (*CSW will update this form in Epic as items are completed)   09/18/2013  Patient/family provided with [place] Department of Clinical Social Work's list of facilities offering this level of care within the geographic area requested by the patient (or if unable, by the patient's family).  09/18/2013  Patient/family informed of their freedom to choose among providers that offer the needed level of care, that participate in Medicare, Medicaid or managed care program needed by the patient, have an available bed and are willing to accept the patient.  09/18/2013  Patient/family informed of [facility]' ownership interest in [facility], as well as of the fact that they are under no obligation to receive care at this facility.  PASARR submitted to EDS on 09/18/2013 PASARR number received on 09/18/2013  FL2 transmitted to all facilities in geographic area requested by pt/family on  09/18/2013 FL2 transmitted to all facilities within larger geographic area on   Patient informed that his/her managed care company has contracts with or will negotiate with  certain facilities, including the following:     Patient/family informed of bed offers received:  09/18/2013 Patient chooses bed at [facility] Physician recommends and patient chooses bed at    Patient to be transferred to [place] on  09/30/2013 Patient to be transferred to facility by Ambulance Patient and family notified of transfer on 09/30/2013 Name of family member notified:  Hassan Rowan and [place] -daughters  The following physician request  were entered in Epic: Physician Request  Please sign FL2.    Additional Comments: Patient encouraged family to determine which facility they will want.  09/30/13  OK per MD for d/c today to [facility]. CSW met with patient and daughters- they were pleased wtih d/c plan. [employer] Auth in place per [place]. Nursing notified to call report.  CSW signing off.

## 2013-10-03 NOTE — H&P (Signed)
[facility] History and Physical  Laura Zuniga QPR:[id number] DOB: 03-Jul-1917 DOA: 10/03/2013  Referring physician: ED PCP: Wenda Low, MD  Specialists: none  Chief Complaint: Acute decompensated heart failure  HPI: 80 y/o ?, known h/o recent admission 6/10-6/24 for pseudomembranous colitis/sigmoid diverticulosis,  Cad without Cath 2007, Grade 1 CKD? From [place] facility after just being discharged. Daughter relates that patient seemed to be more short of breath on the day of discharge and had lower extremity swelling.  She has been unable to sleep flat and daughter was concerned. She has had a sensation of feeling like needing to burp.  She has had no issue eating or drinking.  She is passing stool which is still not solid. She is currently still taking treatment for C. difficile colitis Her daughter and family at bedside  Emergency room workup reveals BNP 3462 Hemoglobin 10.2 MCV 100.3  CO2 33 BUN/creatinine-11/0.81  Chest x-ray = pulmonary edema   Review of Systems: The patient denies fever  chills N/v Blurred vision double vision wekaness on one side of body  Past Medical History  Diagnosis Date  . CHF (congestive heart failure)   . GERD (gastroesophageal reflux disease)   . Hypertension   . Diverticulitis   . UTI (urinary tract infection)   . Blockage of coronary artery of heart 2007    never had cardiac cath, CAD dx by positive ant. wall defect on Nuc. study.  no cath due to high risk.  . Macular degeneration    Past Surgical History  Procedure Laterality Date  . Cholecystectomy    . Appendectomy    . Vaginal hysterectomy    . Eye surgery    . Esophagogastroduodenoscopy N/A 09/22/2013    Procedure: ESOPHAGOGASTRODUODENOSCOPY (EGD);  Surgeon: Lear Ng, MD;  Location: [facility] ENDOSCOPY;  Service: Endoscopy;  Laterality: N/A;  . Flexible sigmoidoscopy N/A 09/22/2013    Procedure: FLEXIBLE SIGMOIDOSCOPY;  Surgeon: Lear Ng,  MD;  Location: [place];  Service: Endoscopy;  Laterality: N/A;  . Flexible sigmoidoscopy N/A 09/22/2013    Procedure: FLEXIBLE SIGMOIDOSCOPY;  Surgeon: Lear Ng, MD;  Location: [place];  Service: Endoscopy;  Laterality: N/A;  unprepped/ egd first  . Esophagogastroduodenoscopy N/A 09/22/2013    Procedure: ESOPHAGOGASTRODUODENOSCOPY (EGD);  Surgeon: Lear Ng, MD;  Location: [facility] ENDOSCOPY;  Service: Endoscopy;  Laterality: N/A;   Social History:  History   Social History Narrative  . No narrative on file    Allergies  Allergen Reactions  . Penicillins     unknown  . Sulfonamide Derivatives     REACTION: Rash and swelling    Family History  Problem Relation Age of Onset  . Heart attack Father 25  . Heart attack Sister   . Cancer Brother   . Cancer Brother   . Cirrhosis Brother   . Heart disease Brother   . Cancer Brother   . COPD Brother   . COPD Sister   . Cancer Sister   . Cancer Sister     Prior to Admission medications   Medication Sig Start Date End Date Taking? Authorizing Provider  acetaminophen (TYLENOL) 500 MG tablet Take 500-1,000 mg by mouth every 6 (six) hours as needed for moderate pain or headache.   Yes Historical Provider, MD  albuterol (PROVENTIL) (2.5 MG/3ML) 0.083% nebulizer solution Take 3 mLs (2.5 mg total) by nebulization every 6 (six) hours as needed for wheezing or shortness of breath. 09/29/13  Yes Wenda Low, MD  alum &  mag hydroxide-simeth (MAALOX/MYLANTA) 200-200-20 MG/5ML suspension Take 30 mLs by mouth every 6 (six) hours as needed for indigestion or heartburn (dyspepsia). 09/29/13  Yes Wenda Low, MD  amiodarone (PACERONE) 200 MG tablet Take 1 tablet (200 mg total) by mouth 2 (two) times daily. 09/29/13  Yes Wenda Low, MD  calcium-vitamin D (OSCAL WITH D) 500-200 MG-UNIT per tablet Take 1 tablet by mouth daily.   Yes Historical Provider, MD  clopidogrel (PLAVIX) 75 MG tablet Take 1 tablet (75 mg total) by mouth  daily. 07/13/13  Yes Jettie Booze, MD  Cranberry 250 MG CAPS Take 1 capsule by mouth daily.   Yes Historical Provider, MD  cyanocobalamin 500 MCG tablet Take 500 mcg by mouth daily.   Yes Historical Provider, MD  diltiazem (CARDIZEM CD) 180 MG 24 hr capsule Take 1 capsule (180 mg total) by mouth daily. 09/29/13  Yes Wenda Low, MD  escitalopram (LEXAPRO) 10 MG tablet Take 5 mg by mouth daily.   Yes Historical Provider, MD  ferrous sulfate 325 (65 FE) MG tablet Take 325 mg by mouth daily with breakfast.   Yes Historical Provider, MD  fidaxomicin (DIFICID) 200 MG TABS tablet Take 1 tablet (200 mg total) by mouth 2 (two) times daily. For 3 more days 09/29/13  Yes Wenda Low, MD  furosemide (LASIX) 40 MG tablet Take 40 mg by mouth daily.   Yes Historical Provider, MD  levalbuterol (XOPENEX) 0.63 MG/3ML nebulizer solution Take 3 mLs (0.63 mg total) by nebulization 3 (three) times daily. 09/29/13  Yes Wenda Low, MD  meclizine (ANTIVERT) 25 MG tablet Take 25 mg by mouth 2 (two) times daily as needed for dizziness.  09/07/13  Yes Historical Provider, MD  Multiple Vitamin (MULITIVITAMIN WITH MINERALS) TABS Take 1 tablet by mouth daily.   Yes Historical Provider, MD  pantoprazole (PROTONIX) 40 MG tablet Take 40 mg by mouth daily.   Yes Historical Provider, MD  potassium chloride SA (K-DUR,KLOR-CON) 20 MEQ tablet Take 1 tablet (20 mEq total) by mouth daily. 09/30/13  Yes Wenda Low, MD  pravastatin (PRAVACHOL) 40 MG tablet Take 40 mg by mouth daily.   Yes Historical Provider, MD  tamsulosin (FLOMAX) 0.4 MG CAPS capsule Take 1 capsule (0.4 mg total) by mouth daily after breakfast. 09/29/13  Yes Wenda Low, MD  vitamin C (ASCORBIC ACID) 500 MG tablet Take 500 mg by mouth daily.   Yes Historical Provider, MD   Physical Exam: Filed Vitals:   10/03/13 1309 10/03/13 1330 10/03/13 1400 10/03/13 1425  BP: 129/49 124/52 120/54   Pulse: 63 59 60   Temp:      TempSrc:      Resp: 33 22 36   Weight:     65.772 kg (145 lb)  SpO2: 100% 100% 100%      General:  EOMI NCAT  Eyes: See above  ENT: Moderate dentition looks younger than stated age  Neck: Soft supple global scar right neck from IJ  Cardiovascular: S1-S2 no murmur rub or gallop  Respiratory: Clinically clear  Abdomen: Soft nontender nondistended no rebound  Skin: Grade 2 to 3 lower extremity pitting edema bilaterally slightly red left leg  Musculoskeletal: Range of motion intact  Psychiatric: Euthymic  Neurologic: Pleasant, 5/5 power reflexes not tested  Labs on Admission:  Basic Metabolic Panel:  Recent Labs Lab 09/27/13 1730 09/28/13 0600 09/29/13 0400 09/29/13 1400 09/29/13 1755 09/30/13 0545 10/03/13 1137  NA 136* 135* 136* 136* 135* 136* 137  K 3.3* 3.9 3.7 4.2  4.2 4.3 4.6  CL 96 94* 96 94* 94* 95* 93*  CO2 30 33* 29 34* 34* 34* 33*  GLUCOSE 102* 108* 101* 104* 113* 104* 108*  BUN 6 5* 31* 7 7 7 11   CREATININE 0.65 0.69 1.54* 0.59 0.63 0.68 0.81  CALCIUM 7.8* 7.9* 9.4 8.0* 8.4 8.4 8.9  MG 1.3* 1.8  --   --   --   --   --    Liver Function Tests:  Recent Labs Lab 09/27/13 1730 09/29/13 1755  AST 36 34  ALT 21 21  ALKPHOS 56 63  BILITOT 0.3 0.4  PROT 5.6* 5.8*  ALBUMIN 2.4* 2.5*   No results found for this basename: LIPASE, AMYLASE,  in the last 168 hours No results found for this basename: AMMONIA,  in the last 168 hours CBC:  Recent Labs Lab 09/27/13 1730 09/28/13 0600 09/29/13 1755 10/03/13 1137  WBC 8.6 7.1 7.9 8.0  NEUTROABS 7.3  --   --  5.2  HGB 10.7* 10.2* 10.7* 10.2*  HCT 32.0* 30.3* 32.3* 31.2*  MCV 95.8 95.9 97.3 100.3*  PLT 274 261 270 245   Cardiac Enzymes:  Recent Labs Lab 09/27/13 1730 09/28/13 0200 10/03/13 1137  TROPONINI <0.30 <0.30 <0.30    BNP (last 3 results)  Recent Labs  09/27/13 1730 10/03/13 1137  PROBNP 4678.0* 3462.0*   CBG: No results found for this basename: GLUCAP,  in the last 168 hours  Radiological Exams on Admission: Dg  Chest 2 View  10/03/2013   CLINICAL DATA:  dyspnea dyspnea  EXAM: CHEST - 2 VIEW  COMPARISON:  09/27/2013  FINDINGS: The central line has been removed. Persistent pleural effusions left greater than right with adjacent consolidation/ atelectasis at the lung bases. Mild central pulmonary vascular congestion. Mild cardiomegaly. Surgical clips in the upper abdomen. Patchy aorta aortic calcifications.  IMPRESSION: 1. Little convincing change since previous exam in bilateral infiltrates/edema, effusions, and cardiomegaly.   Electronically Signed   By: Arne Cleveland M.D.   On: 10/03/2013 13:25    EKG: Independently reviewed. Each of fibrillation rate controlled rate. 3-1 to 41. QRS axis 45  Assessment/Plan Principal Problem:   Acute on chronic diastolic CHF (congestive heart failure), NYHA class 4-patient presents with likely acute exacerbation of underlying diastolic dysfunction. Surprisingly echo done 6/20 does not confirm diastolic. We will continue Lasix at a higher dose 80 mg every 12 hourly. Patient will also continue Cardizem 180 every 24 as well as amiodarone 200 3 times a day with a goal to transition to from 20 mg daily in about a week or so. Consult cardiology if needed as per attending physician in a.m. Active Problems:   Recently treated C. difficile/Shigella colitis-last 8 [place] was supposed to be 6/26. 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 Monitor   HYPERTENSION-see above discussion   CAD-consider aspirin low-dose once out of threshold for aspirin   COPD (chronic obstructive pulmonary disease)   Atrial fibrillation with RVR-none systemic anticoagulation secondary to recurrent falls and recent colitis she still in atrial fibrillation and this will need to be discussed long-term with primary care physician    COPD-continue inhalers  Patient to be assumed care of a Dr. Deforest Hoyles in the morning Time spent: 27 Patient states she would like to be a focal. This will need to be discussed  further with primary care physician and patient  [place], [place] [facility] Pager [phone]  If 7PM-7AM, please contact night-coverage [email or web address] Password [email or web address] 10/03/2013, 2:51 PM

## 2013-10-03 NOTE — Telephone Encounter (Signed)
Pt recently d/c'd from [facility] after admission with C diff and PAF.  She is @ [facility].  Dtr called this AM stating that pts breathing is shallow and she is very swollen.  Difficult to arouse this AM.  Says staff @ SNF not doing anything for her and she plans to call EMS.  I rec that based on her description, it does sound like the pt should be seen by med staff today.  I rec that she contact staff @ SNF to let them know that she was planning to have her tx to [facility] for eval.  She verbalized understanding.

## 2013-10-03 NOTE — ED Notes (Signed)
Per [facility], pt from nursing rehab facility [facility]). pt hx of Cdiff, cleared from antibiotics. Earlier today she had some dyspnea with some chest pressure. Pt denies pain at this time. 93% RA, 98% on 2L. 12 lead unremarkable, NSR with occasional PVC. Pt has increased WOB while in bed. Pt states she feels like she needs to burp. Denies feeling pressure in chest.

## 2013-10-04 ENCOUNTER — Encounter (HOSPITAL_COMMUNITY): Payer: Self-pay

## 2013-10-04 LAB — COMPREHENSIVE METABOLIC PANEL
ALBUMIN: 2.5 g/dL — AB (ref 3.5–5.2)
ALK PHOS: 58 U/L (ref 39–117)
ALT: 29 U/L (ref 0–35)
AST: 36 U/L (ref 0–37)
BUN: 12 mg/dL (ref 6–23)
CO2: 36 mEq/L — ABNORMAL HIGH (ref 19–32)
CREATININE: 0.89 mg/dL (ref 0.50–1.10)
Calcium: 8.7 mg/dL (ref 8.4–10.5)
Chloride: 94 mEq/L — ABNORMAL LOW (ref 96–112)
GFR calc Af Amer: 62 mL/min — ABNORMAL LOW (ref >90)
GFR calc non Af Amer: 54 mL/min — ABNORMAL LOW (ref >90)
Glucose, Bld: 91 mg/dL (ref 70–99)
POTASSIUM: 4.6 meq/L (ref 3.7–5.3)
Sodium: 139 mEq/L (ref 137–147)
Total Bilirubin: 0.6 mg/dL (ref 0.3–1.2)
Total Protein: 6.1 g/dL (ref 6.0–8.3)

## 2013-10-04 LAB — CBC
HCT: 28.8 % — ABNORMAL LOW (ref 36.0–46.0)
HCT: 29.9 % — ABNORMAL LOW (ref 36.0–46.0)
HEMOGLOBIN: 9.3 g/dL — AB (ref 12.0–15.0)
Hemoglobin: 9.6 g/dL — ABNORMAL LOW (ref 12.0–15.0)
MCH: 32 pg (ref 26.0–34.0)
MCH: 31.9 pg (ref 26.0–34.0)
MCHC: 32.3 g/dL (ref 30.0–36.0)
MCHC: 32.1 g/dL (ref 30.0–36.0)
MCV: 99 fL (ref 78.0–100.0)
MCV: 99.3 fL (ref 78.0–100.0)
Platelets: 234 10*3/uL (ref 150–400)
Platelets: 240 10*3/uL (ref 150–400)
RBC: 2.91 MIL/uL — AB (ref 3.87–5.11)
RBC: 3.01 MIL/uL — ABNORMAL LOW (ref 3.87–5.11)
RDW: 13.6 % (ref 11.5–15.5)
RDW: 13.5 % (ref 11.5–15.5)
WBC: 5.7 10*3/uL (ref 4.0–10.5)
WBC: 6.3 10*3/uL (ref 4.0–10.5)

## 2013-10-04 LAB — PROTIME-INR
INR: 1.14 (ref 0.00–1.49)
Prothrombin Time: 14.6 seconds (ref 11.6–15.2)

## 2013-10-04 MED ORDER — FUROSEMIDE 80 MG PO TABS: 80.0000 mg | ORAL_TABLET | Freq: Two times a day (BID) | ORAL | Status: DC

## 2013-10-04 MED ORDER — POTASSIUM CHLORIDE CRYS ER 20 MEQ PO TBCR: 20.0000 meq | EXTENDED_RELEASE_TABLET | Freq: Two times a day (BID) | ORAL | Status: DC

## 2013-10-04 NOTE — Progress Notes (Signed)
Assessment/Plan: Principal Problem:   Acute on chronic diastolic CHF (congestive heart failure), NYHA class 4 - improved. Will plan on giving her more furosemide today.  Active Problems:   DEMENTIA, CCE, W/O BEHAVIORAL DISTURBANCE   DEPRESSION   HYPERTENSION   CAD   COPD (chronic obstructive pulmonary disease)   Atrial fibrillation with RVR   CHF (congestive heart failure)   Acute heart failure   Subjective: Feeling better. Breathing better. Slept some overnight.   Objective:  Vital Signs: Filed Vitals:   10/03/13 2028 10/03/13 2209 10/04/13 0045 10/04/13 0445  BP: 147/47  115/44 114/61  Pulse: 62 54 53 62  Temp: 97.3 F (36.3 C)   97.5 F (36.4 C)  TempSrc: Oral   Oral  Resp: 18  18 18   Height:      Weight:    68.992 kg (152 lb 1.6 oz)  SpO2: 98%  98% 98%     EXAM: Clear at bases.    Intake/Output Summary (Last 24 hours) at 10/04/13 0959 Last data filed at 10/04/13 0300  Gross per 24 hour  Intake    100 ml  Output   1450 ml  Net  -1350 ml    Lab Results:  Recent Labs  10/03/13 1137 10/04/13 0410  NA 137 139  K 4.6 4.6  CL 93* 94*  CO2 33* 36*  GLUCOSE 108* 91  BUN 11 12  CREATININE 0.81 0.89  CALCIUM 8.9 8.7    Recent Labs  10/04/13 0410  AST 36  ALT 29  ALKPHOS 58  BILITOT 0.6  PROT 6.1  ALBUMIN 2.5*   No results found for this basename: LIPASE, AMYLASE,  in the last 72 hours  Recent Labs  10/03/13 1137 10/04/13 0410  WBC 8.0 5.7  NEUTROABS 5.2  --   HGB 10.2* 9.3*  HCT 31.2* 28.8*  MCV 100.3* 99.0  PLT 245 234    Recent Labs  10/03/13 1137  TROPONINI <0.30   BNP    Component Value Date/Time   PROBNP 3462.0* 10/03/2013 1137   No results found for this basename: DDIMER,  in the last 72 hours No results found for this basename: HGBA1C,  in the last 72 hours No results found for this basename: CHOL, HDL, LDLCALC, TRIG, CHOLHDL, LDLDIRECT,  in the last 72 hours No results found for this basename: TSH, T4TOTAL, FREET3,  T3FREE, THYROIDAB,  in the last 72 hours No results found for this basename: VITAMINB12, FOLATE, FERRITIN, TIBC, IRON, RETICCTPCT,  in the last 72 hours  Studies/Results: Dg Chest 2 View  10/03/2013   CLINICAL DATA:  dyspnea dyspnea  EXAM: CHEST - 2 VIEW  COMPARISON:  09/27/2013  FINDINGS: The central line has been removed. Persistent pleural effusions left greater than right with adjacent consolidation/ atelectasis at the lung bases. Mild central pulmonary vascular congestion. Mild cardiomegaly. Surgical clips in the upper abdomen. Patchy aorta aortic calcifications.  IMPRESSION: 1. Little convincing change since previous exam in bilateral infiltrates/edema, effusions, and cardiomegaly.   Electronically Signed   By: Arne [place] M.D.   On: 10/03/2013 13:25   Medications: Medications administered in the last 24 hours reviewed.  Current Medication List reviewed.    LOS: 1 day   [facility] Internal Medicine @ [facility] [phone]) 10/04/2013, 9:59 AM

## 2013-10-04 NOTE — Progress Notes (Signed)
HISTORY & PHYSICAL  DATE: 10/02/2013   FACILITY: [facility]  LEVEL OF CARE: SNF (31)  ALLERGIES:  Allergies  Allergen Reactions  . Penicillins     unknown  . Sulfonamide Derivatives     REACTION: Rash and swelling    CHIEF COMPLAINT:  Manage CHF, COPD & HTN  HISTORY OF PRESENT ILLNESS: 80 y/o Caucasian female was hospitalized secondary to pancolitis.  After hospitalization pt was admitted to this facility for SNF rehab.  COPD: the COPD remains stable.  Pt denies sob, cough, wheezing or declining exercise tolerance.  No complications from the medications presently being used.  CHF:The patient does not relate significant weight changes, denies sob, DOE, orthopnea, PNDs, palpitations or chest pain.  CHF remains stable.  No complications form the medications being used. C/o LE swelling.  HTN: Pt 's HTN remains stable.  Denies CP, sob, DOE, headaches, dizziness or visual disturbances.  No complications from the medications currently being used.  Last BP :132/66.  PAST MEDICAL HISTORY :  Past Medical History  Diagnosis Date  . CHF (congestive heart failure)   . GERD (gastroesophageal reflux disease)   . Hypertension   . Diverticulitis   . UTI (urinary tract infection)   . Blockage of coronary artery of heart 2007    never had cardiac cath, CAD dx by positive ant. wall defect on Nuc. study.  no cath due to high risk.  . Macular degeneration     PAST SURGICAL HISTORY: Past Surgical History  Procedure Laterality Date  . Cholecystectomy    . Appendectomy    . Vaginal hysterectomy    . Eye surgery    . Esophagogastroduodenoscopy N/A 09/22/2013    Procedure: ESOPHAGOGASTRODUODENOSCOPY (EGD);  Surgeon: Lear Ng, MD;  Location: [facility] ENDOSCOPY;  Service: Endoscopy;  Laterality: N/A;  . Flexible sigmoidoscopy N/A 09/22/2013    Procedure: FLEXIBLE SIGMOIDOSCOPY;  Surgeon: Lear Ng, MD;  Location: [place];  Service: Endoscopy;   Laterality: N/A;  . Flexible sigmoidoscopy N/A 09/22/2013    Procedure: FLEXIBLE SIGMOIDOSCOPY;  Surgeon: Lear Ng, MD;  Location: [place];  Service: Endoscopy;  Laterality: N/A;  unprepped/ egd first  . Esophagogastroduodenoscopy N/A 09/22/2013    Procedure: ESOPHAGOGASTRODUODENOSCOPY (EGD);  Surgeon: Lear Ng, MD;  Location: [facility] ENDOSCOPY;  Service: Endoscopy;  Laterality: N/A;    SOCIAL HISTORY:  reports that she has never smoked. She does not have any smokeless tobacco history on file. She reports that she does not drink alcohol or use illicit drugs.  FAMILY HISTORY:  Family History  Problem Relation Age of Onset  . Heart attack Father 56  . Heart attack Sister   . Cancer Brother   . Cancer Brother   . Cirrhosis Brother   . Heart disease Brother   . Cancer Brother   . COPD Brother   . COPD Sister   . Cancer Sister   . Cancer Sister     CURRENT MEDICATIONS: Reviewed per MAR/see medication list  REVIEW OF SYSTEMS:  See HPI otherwise 14 point ROS is negative.  PHYSICAL EXAMINATION  VS:  See VS section  GENERAL: no acute distress, normal body habitus EYES: conjunctivae normal, sclerae normal, normal eye lids MOUTH/THROAT: lips without lesions,no lesions in the mouth,tongue is without lesions,uvula elevates in midline NECK: supple, trachea midline, no neck masses, no thyroid tenderness, no thyromegaly LYMPHATICS: no LAN in the neck, no supraclavicular LAN RESPIRATORY: breathing is even & unlabored,  BS CTAB CARDIAC: RRR, no murmur,no extra heart sounds, +2 BLE edema, LUE +1 edema GI:  ABDOMEN: abdomen soft, normal BS, no masses, no tenderness  LIVER/SPLEEN: no hepatomegaly, no splenomegaly MUSCULOSKELETAL: HEAD: normal to inspection  EXTREMITIES: LEFT UPPER EXTREMITY: full range of motion, decreased strength & tone RIGHT UPPER EXTREMITY:  full range of motion, decreased strength & tone LEFT LOWER EXTREMITY: moderate range of motion, decreased  strength & tone RIGHT LOWER EXTREMITY: moderate range of motion, decreased strength & tone PSYCHIATRIC: the patient is alert & oriented to person, affect & behavior appropriate  LABS/RADIOLOGY:  Labs reviewed: Basic Metabolic Panel:  Recent Labs  09/16/13 1858  09/21/13 0611 09/22/13 0640 09/23/13 0510  09/27/13 1730 09/28/13 0600  09/30/13 0545 10/03/13 1137 10/04/13 0410  NA  --   < > 131* 133* 137  < > 136* 135*  < > 136* 137 139  K  --   < > 3.6* 3.1* 3.4*  < > 3.3* 3.9  < > 4.3 4.6 4.6  CL  --   < > 96 100 104  < > 96 94*  < > 95* 93* 94*  CO2  --   < > 18* 18* 21  < > 30 33*  < > 34* 33* 36*  GLUCOSE  --   < > 87 91 84  < > 102* 108*  < > 104* 108* 91  BUN  --   < > 35* 31* 23  < > 6 5*  < > 7 11 12   CREATININE  --   < > 1.98* 1.58* 1.09  < > 0.65 0.69  < > 0.68 0.81 0.89  CALCIUM  --   < > 8.0* 8.1* 8.0*  < > 7.8* 7.9*  < > 8.4 8.9 8.7  MG 1.6  --   --   --   --   --  1.3* 1.8  --   --   --   --   PHOS 2.7  --  3.8 3.6 2.7  --   --   --   --   --   --   --   < > = values in this interval not displayed. Liver Function Tests:  Recent Labs  09/27/13 1730 09/29/13 1755 10/04/13 0410  AST 36 34 36  ALT 21 21 29   ALKPHOS 56 63 58  BILITOT 0.3 0.4 0.6  PROT 5.6* 5.8* 6.1  ALBUMIN 2.4* 2.5* 2.5*    Recent Labs  09/16/13 1304  LIPASE 13   CBC:  Recent Labs  09/16/13 1304  09/27/13 1730  09/29/13 1755 10/03/13 1137 10/04/13 0410  WBC 13.3*  < > 8.6  < > 7.9 8.0 5.7  NEUTROABS 10.4*  --  7.3  --   --  5.2  --   HGB 10.5*  < > 10.7*  < > 10.7* 10.2* 9.3*  HCT 31.0*  < > 32.0*  < > 32.3* 31.2* 28.8*  MCV 95.4  < > 95.8  < > 97.3 100.3* 99.0  PLT 197  < > 274  < > 270 245 234  < > = values in this interval not displayed.  Cardiac Enzymes:  Recent Labs  09/27/13 1730 09/28/13 0200 10/03/13 1137  TROPONINI <0.30 <0.30 <0.30    Transthoracic Echocardiography  Patient:    Alexi, Dorminey MR #:       [id number] Study Date: 09/28/2013 Gender:      F Age:  89 Height:     154.9 cm Weight:     72.6 kg BSA:        1.79 m^2 Pt. Status: Room:       2H17C   ADMITTING    Wenda Low [id number]  [id number]    ATFTDD, Karrar [address] [id number]  [place], [place]  SONOGRAPHER  Tresa Res, RDCS  PERFORMING   [facility], Inpatient  cc:  ------------------------------------------------------------------- LV EF: 55% -   60%  ------------------------------------------------------------------- Indications:      Atrial fibrillation - 427.31.  CHF - 428.0.   ------------------------------------------------------------------- Study Conclusions  - Left ventricle: Wall thickness was increased in a pattern of mild   LVH. Systolic function was normal. The estimated ejection   fraction was in the range of 55% to 60%. - Aortic valve: There was mild regurgitation. - Mitral valve: There was mild regurgitation. - Left atrium: The atrium was moderately dilated. - Right atrium: The atrium was mildly dilated. - Pulmonary arteries: PA peak pressure: 52 mm Hg (S).  Transthoracic echocardiography.  M-mode, complete 2D, spectral Doppler, and color Doppler.  Birthdate:  Patient birthdate: 1917-12-08.  Age:  Patient is 80 yr old.  Sex:  Gender: female. Height:  Height: 154.9 cm. Height: 61 in.  Weight:  Weight: 72.6 kg. Weight: 159.7 lb.  Body mass index:  BMI: 30.2 kg/m^2.  Body surface area:    BSA: 1.79 m^2.  Blood pressure:     135/42 Patient status:  Inpatient.  Study date:  Study date: 09/28/2013. Study time: 12:15 PM.  Location:  ICU/CCU  -------------------------------------------------------------------  ------------------------------------------------------------------- Left ventricle:   Wall thickness was increased in a pattern of mild LVH.   Systolic function was normal. The estimated ejection fraction was in the range of 55% to  60%.  ------------------------------------------------------------------- Aortic valve:  AV is moderately thickened and calcified with very mild restricted motion.  Doppler:  There was mild regurgitation.   ------------------------------------------------------------------- Mitral valve:  MR directed posterior into LA Appearsmderately severe to severe .  Mildly thickened leaflets .  Doppler:  There was mild regurgitation.    Peak gradient (D): 5 mm Hg.  ------------------------------------------------------------------- Left atrium:  The atrium was moderately dilated.  ------------------------------------------------------------------- Right ventricle:  The cavity size was normal. Wall thickness was normal. Systolic function was normal.  ------------------------------------------------------------------- Tricuspid valve:   Structurally normal valve.   Leaflet separation was normal.  Doppler:  Transvalvular velocity was within the normal range. There was mild regurgitation.  ------------------------------------------------------------------- Right atrium:  The atrium was mildly dilated.  ------------------------------------------------------------------- Pericardium:  There was no pericardial effusion.  ------------------------------------------------------------------- Systemic veins: Inferior vena cava: The vessel was dilated. The respirophasic diameter changes were blunted (< 50%), consistent with elevated central venous pressure.  ------------------------------------------------------------------- Prepared and Electronically Authenticated by  Dorris Carnes, M.D. 2015-06-22T17:24:40  ------------------------------------------------------------------- Measurements   Left ventricle                              Value        Reference  LV ID, ED, PLAX chordal             (L)     39.5  mm     43 - 52  LV ID, ES, PLAX chordal             (N)     27.9  mm     23 -  38  LV fx  shortening, PLAX chordal      (N)     29    %      >=29  LV PW thickness, ED                         11.1  mm     ---------  IVS/LV PW ratio, ED                 (N)     1.2          <=1.3  LV e&', lateral                              10.5  cm/s   ---------  LV E/e&', lateral                            10.76        ---------  LV e&', medial                               8.44  cm/s   ---------  LV E/e&', medial                             13.39        ---------  LV e&', average                              9.47  cm/s   ---------  LV E/e&', average                            11.93        ---------    Ventricular septum                          Value        Reference  IVS thickness, ED                           13.3  mm     ---------    LVOT                                        Value        Reference  LVOT ID, S                                  18    mm     ---------  LVOT area                                   2.54  cm^2   ---------    Aortic valve                                Value  Reference  Aortic regurg pressure half-time            208   ms     ---------    Aorta                                       Value        Reference  Aortic root ID, ED                          25    mm     ---------    Left atrium                                 Value        Reference  LA ID, A-P, ES                              38    mm     ---------  LA ID/bsa, A-P                      (N)     2.12  cm/m^2 <=2.2    Mitral valve                                Value        Reference  Mitral E-wave peak velocity                 113   cm/s   ---------  Mitral A-wave peak velocity                 43.9  cm/s   ---------  Mitral deceleration time            (N)     165   ms     150 - 230  Mitral peak gradient, D                     5     mm Hg  ---------  Mitral E/A ratio, peak                      2.6          ---------  Mitral maximal regurg velocity,             506   cm/s   ---------  PISA   Mitral regurg VTI, PISA                     191   cm     ---------  Mitral ERO, PISA                            0.1   cm^2   ---------  Mitral regurg volume, PISA                  19    ml     ---------    Pulmonary arteries  Value        Reference  PA pressure, S, DP                  (H)     52    mm Hg  <=30    Tricuspid valve                             Value        Reference  Tricuspid regurg peak velocity              305   cm/s   ---------  Tricuspid peak RV-RA gradient               37    mm Hg  ---------  Tricuspid maximal regurg velocity,          305   cm/s   ---------  PISA    Systemic veins                              Value        Reference  Estimated CVP                               15    mm Hg  ---------    Right ventricle                             Value        Reference  RV pressure, S, DP                  (H)     52    mm Hg  <=30  Legend: (L)  and  (H)  mark values outside specified reference range.  (N)  marks values inside specified reference range.     Wall Scoring      Printable Result Report    Result Report      RENAL/URINARY TRACT ULTRASOUND COMPLETE   COMPARISON:  CT of the abdomen and pelvis on 09/18/2013   FINDINGS: Right Kidney:   Length: 9.6 cm. Simple cysts are identified in the midpole region measuring 1.9 by 1.9 x 2.0 cm and 1.6 x 1.6 x 1.8 cm.   Left Kidney:   Length: 10.7 cm. Echogenicity within normal limits. No mass or hydronephrosis visualized.   Bladder:   The bladder is decompressed by a Foley.   IMPRESSION: 1. Right renal cyst. 2. No hydronephrosis. 3. Foley catheter.   ABDOMEN - 2 VIEW   COMPARISON:  Abdominal radiograph performed 09/16/2013, and CT of the abdomen and pelvis performed 09/18/2013   FINDINGS: There is distention of the ascending and transverse colon, with residual contrast seen along the ascending colon. This raises question for mild ileus, likely secondary to known  colitis. Associated air-fluid levels are seen within the colon on the decubitus view. There is no definite evidence for distal obstruction. No small bowel dilatation is seen. No free intra-abdominal air is identified.   Clips are noted within the right upper quadrant, reflecting prior cholecystectomy. No acute osseous abnormalities are identified.   IMPRESSION: Distention of the ascending and transverse colon, with residual contrast along the ascending colon. The appearance is concerning for mild ileus, likely secondary to known colitis. Associated air-fluid levels seen within  the colon. No definite evidence for distal obstruction.   CT ABDOMEN AND PELVIS WITHOUT CONTRAST   TECHNIQUE: Multidetector CT imaging of the abdomen and pelvis was performed following the standard protocol without IV contrast.   COMPARISON:  CT 09/18/2013.   FINDINGS: Bones: Lumbar spondylosis, scoliosis and facet arthrosis. No aggressive osseous lesions.   Lung Bases: Increasing small bilateral dependently layering pleural effusions. Old granulomatous disease. Compressive atelectasis in the lower lobes. 9 mm nodular density in the right middle lobe is unchanged compared to recent prior. Large pericardial fat pad. Diaphragmatic hernia is present with less fluid in the anterior pre pericardial herniated fat. This probably represents herniated omentum.   Liver: Unenhanced CT was performed per clinician order. Lack of IV contrast limits sensitivity and specificity, especially for evaluation of abdominal/pelvic solid viscera. Grossly normal.   Spleen:  Normal.   Gallbladder:  Surgically absent.   Common bile duct:  Normal.   Pancreas:  Normal.   Adrenal glands:  Normal bilaterally.   Kidneys: There is high density of the kidneys, related to prior contrast injection and suggesting contrast induced nephropathy. The kidneys demonstrate Double the Hounsfield unit attenuation of the intravascular  compartment. No excreted contrast is present in the collecting systems. No hydronephrosis. Right renal cysts.   Stomach:  Small hiatal hernia.  No inflammatory changes of stomach.   Small bowel: Normal appearance of the duodenum. No small bowel obstruction.   Colon: Pan colitis is present. There is no intra-abdominal free air or perforation. Pericolonic inflammatory changes. The distal colon is more decompressed than on the prior exam. Diverticulosis is again noted. Pericolonic ascites. The volume of ascites is small.   Pelvic Genitourinary: Hysterectomy. Urinary bladder decompressed with Foley catheter.   Vasculature: Atherosclerosis.   Body Wall: Anasarca.  Fat containing periumbilical hernia.   IMPRESSION: 1. Pan colitis.  No perforation or abscess. 2. High-density of the kidneys. This may be related to contrast induced nephropathy from CT 09/16/2012. 3. Small bilateral pleural effusions with associated atelectasis. 4. 9 mm right middle lobe subpleural pulmonary nodule. Two to three-month noncontrast chest CT recommended to assess for stability. Based on the sessile appearance, this probably represents a subpleural lymph node. 5. Anasarca.  Small volume ascites. CHEST - 2 VIEW   COMPARISON:  09/27/2013   FINDINGS: The central line has been removed. Persistent pleural effusions left greater than right with adjacent consolidation/ atelectasis at the lung bases. Mild central pulmonary vascular congestion. Mild cardiomegaly. Surgical clips in the upper abdomen. Patchy aorta aortic calcifications.   IMPRESSION: 1. Little convincing change since previous exam in bilateral infiltrates/edema, effusions, and cardiomegaly.     ASSESSMENT/PLAN:  COPD-compensated CHF-compensated HTN-well controlled. Atrial fibrillation- cont. Amiodarone Moderate malnutrition-cont. Supplements Depression-cont. lexapro Check cbc & bmp  I have reviewed patient's medical records received  at admission/from hospitalization.  CPT CODE: 23557  Gayani Y [place], [place] [phone]

## 2013-10-05 ENCOUNTER — Telehealth: Payer: Self-pay | Admitting: Interventional Cardiology

## 2013-10-05 ENCOUNTER — Inpatient Hospital Stay (HOSPITAL_COMMUNITY): Payer: Medicare HMO

## 2013-10-05 ENCOUNTER — Encounter: Payer: Self-pay | Admitting: Interventional Cardiology

## 2013-10-05 DIAGNOSIS — I4891 Unspecified atrial fibrillation: Secondary | ICD-10-CM

## 2013-10-05 DIAGNOSIS — I2510 Atherosclerotic heart disease of native coronary artery without angina pectoris: Secondary | ICD-10-CM

## 2013-10-05 LAB — BASIC METABOLIC PANEL
BUN: 29 mg/dL — ABNORMAL HIGH (ref 6–23)
CO2: 26 mEq/L (ref 19–32)
Calcium: 8.6 mg/dL (ref 8.4–10.5)
Chloride: 97 mEq/L (ref 96–112)
Creatinine, Ser: 1.26 mg/dL — ABNORMAL HIGH (ref 0.50–1.10)
GFR calc Af Amer: 41 mL/min — ABNORMAL LOW (ref >90)
GFR calc non Af Amer: 35 mL/min — ABNORMAL LOW (ref >90)
Glucose, Bld: 93 mg/dL (ref 70–99)
Potassium: 4.7 mEq/L (ref 3.7–5.3)
Sodium: 135 mEq/L — ABNORMAL LOW (ref 137–147)

## 2013-10-05 LAB — PRO B NATRIURETIC PEPTIDE: Pro B Natriuretic peptide (BNP): 2305 pg/mL — ABNORMAL HIGH (ref 0–450)

## 2013-10-05 MED ORDER — FUROSEMIDE 40 MG PO TABS: ORAL_TABLET | ORAL | Status: DC

## 2013-10-05 MED ORDER — FUROSEMIDE 40 MG PO TABS: 40.0000 mg | ORAL_TABLET | ORAL | Status: DC

## 2013-10-05 MED ORDER — FUROSEMIDE 10 MG/ML IJ SOLN
40.0000 mg | Freq: Two times a day (BID) | INTRAMUSCULAR | Status: DC
  Administered 2013-10-05: 40 mg via INTRAVENOUS
  Filled 2013-10-05 (×3): qty 4

## 2013-10-05 MED ORDER — AMIODARONE HCL 200 MG PO TABS
200.0000 mg | ORAL_TABLET | Freq: Every day | ORAL | Status: DC
Start: 2013-10-05 — End: 2014-05-06

## 2013-10-05 MED ORDER — AMIODARONE HCL 200 MG PO TABS
200.0000 mg | ORAL_TABLET | Freq: Every day | ORAL | Status: DC
  Administered 2013-10-05 – 2013-10-12 (×8): 200 mg via ORAL
  Filled 2013-10-05 (×8): qty 1

## 2013-10-05 MED ORDER — FUROSEMIDE 40 MG PO TABS
40.0000 mg | ORAL_TABLET | Freq: Every day | ORAL | Status: DC
  Filled 2013-10-05: qty 1

## 2013-10-05 MED ORDER — ISOSORBIDE MONONITRATE 15 MG HALF TABLET
15.0000 mg | ORAL_TABLET | Freq: Every day | ORAL | Status: DC
  Administered 2013-10-05 – 2013-10-12 (×8): 15 mg via ORAL
  Filled 2013-10-05 (×9): qty 1

## 2013-10-05 MED ORDER — FUROSEMIDE 80 MG PO TABS
80.0000 mg | ORAL_TABLET | Freq: Every day | ORAL | Status: DC
  Filled 2013-10-05: qty 1

## 2013-10-05 MED ORDER — HYDRALAZINE HCL 10 MG PO TABS
10.0000 mg | ORAL_TABLET | Freq: Three times a day (TID) | ORAL | Status: DC
  Administered 2013-10-05 – 2013-10-12 (×20): 10 mg via ORAL
  Filled 2013-10-05 (×25): qty 1

## 2013-10-05 NOTE — Telephone Encounter (Signed)
Pts daughter was concerned because cardiology had not been consulted this morning, but now they have been. She will call the hospital to get an update on her mother.

## 2013-10-05 NOTE — Discharge Summary (Signed)
Physician Discharge Summary  Patient ID: Laura Zuniga MRN: [id number] DOB/AGE: 07/15/1917 80 y.o.  Admit date: 10/03/2013 Discharge date: 10/05/2013  Admission Diagnoses:  Discharge Diagnoses:  Principal Problem:   Acute on chronic diastolic CHF (congestive heart failure), NYHA class 4 Active Problems:   DEMENTIA, CCE, W/O BEHAVIORAL DISTURBANCE   DEPRESSION   HYPERTENSION   CAD   COPD (chronic obstructive pulmonary disease)   Atrial fibrillation with RVR   CHF (congestive heart failure)   Acute heart failure C. Difficile colitis resolved- repeat stool test negative Chronic anemia stable  Discharged Condition: good  Hospital Course: 80 years old female admitted with shortness of breath found to have mild exacerbation of diastolic heart failure acute on chronic chest x-ray showed small effusion and vascular congestion no change from previous she left the hospital one week ago after a prolonged course with colitis with see different shigellae finish the antibiotic treatments, also had A. fib and RVR which was medically treated; nonicteric for Coumadin Cardiac markers negative. Problem #1: Acute on chronic diastolic heart failure: Patient was started on IV Lasix diuresed well over the 2 days, creatinine up to 1.2- from 0.8 suggesting of over diuresing; shortness of breath improved. She also has underlying history of COPD. Lasix dose changed to p.o. a day milligrams the morning 40 mg in the afternoon with increasing and potassium to twice a day; Follow with cardiology as scheduled per her daughter's- sometimes in one to 2 weeks She will need a blood chemistry checked again for her kidney function potassium. Problem #2: Recent history of C. difficile colitis; finished antibiotics- repeat stool studies negative for C. difficile. Stools now more formed without diarrhea. WBC count normal Problem 3: Atrial fibrillation with RVR and previous hospitalization started on amiodarone and  diltiazem; heart rate in the 50s to 60s decrease amiodarone to once a day; continue diltiazem follow with cardiology. Problem #4 hypertension blood pressure controlled. Problem #5: Anemia of chronic disease stable hemoglobin 9.3 Problem#6 depression continue her Lexapro Problem #7 COPD continue nebulizer treatment and oxygen p.r.n.  Consults: None  Significant Diagnostic Studies: labs: WBC count normal, hemoglobin 9.3, BNP slightly elevated at 3426; troponin negative; creatinine 1.2. Potassium 4.2; C. difficile negative and radiology: CXR: cardiomegaly and pleural effusion: small  Treatments: cardiac meds: furosemide  Discharge Exam: Blood pressure 138/79, pulse 60, temperature 97.5 F (36.4 C), temperature source Oral, resp. rate 22, height 5\' 1"  (1.549 m), weight 68.402 kg (150 lb 12.8 oz), SpO2 96.00%. Resp: clear to auscultation bilaterally Cardio: irregularly irregular rhythm GI: soft, non-tender; bowel sounds normal; no masses,  no organomegaly  Disposition: 03-Skilled Nursing Facility  Discharge Instructions   Diet - low sodium heart healthy    Complete by:  As directed      Increase activity slowly    Complete by:  As directed             Medication List    STOP taking these medications       fidaxomicin 200 MG Tabs tablet  Commonly known as:  DIFICID      TAKE these medications       acetaminophen 500 MG tablet  Commonly known as:  TYLENOL  Take 500-1,000 mg by mouth every 6 (six) hours as needed for moderate pain or headache.     albuterol (2.5 MG/3ML) 0.083% nebulizer solution  Commonly known as:  PROVENTIL  Take 3 mLs (2.5 mg total) by nebulization every 6 (six) hours as needed for wheezing or shortness  of breath.     alum & mag hydroxide-simeth 200-200-20 MG/5ML suspension  Commonly known as:  MAALOX/MYLANTA  Take 30 mLs by mouth every 6 (six) hours as needed for indigestion or heartburn (dyspepsia).     amiodarone 200 MG tablet  Commonly known as:   PACERONE  Take 1 tablet (200 mg total) by mouth daily.     calcium-vitamin D 500-200 MG-UNIT per tablet  Commonly known as:  OSCAL WITH D  Take 1 tablet by mouth daily.     clopidogrel 75 MG tablet  Commonly known as:  PLAVIX  Take 1 tablet (75 mg total) by mouth daily.     Cranberry 250 MG Caps  Take 1 capsule by mouth daily.     cyanocobalamin 500 MCG tablet  Take 500 mcg by mouth daily.     diltiazem 180 MG 24 hr capsule  Commonly known as:  CARDIZEM CD  Take 1 capsule (180 mg total) by mouth daily.     escitalopram 10 MG tablet  Commonly known as:  LEXAPRO  Take 5 mg by mouth daily.     ferrous sulfate 325 (65 FE) MG tablet  Take 325 mg by mouth daily with breakfast.     furosemide 40 MG tablet  Commonly known as:  LASIX  2 tablet in am, and 1 tablet at noon     levalbuterol 0.63 MG/3ML nebulizer solution  Commonly known as:  XOPENEX  Take 3 mLs (0.63 mg total) by nebulization 3 (three) times daily.     meclizine 25 MG tablet  Commonly known as:  ANTIVERT  Take 25 mg by mouth 2 (two) times daily as needed for dizziness.     multivitamin with minerals Tabs tablet  Take 1 tablet by mouth daily.     pantoprazole 40 MG tablet  Commonly known as:  PROTONIX  Take 40 mg by mouth daily.     potassium chloride SA 20 MEQ tablet  Commonly known as:  K-DUR,KLOR-CON  Take 1 tablet (20 mEq total) by mouth 2 (two) times daily.     pravastatin 40 MG tablet  Commonly known as:  PRAVACHOL  Take 40 mg by mouth daily.     tamsulosin 0.4 MG Caps capsule  Commonly known as:  FLOMAX  Take 1 capsule (0.4 mg total) by mouth daily after breakfast.     vitamin C 500 MG tablet  Commonly known as:  ASCORBIC ACID  Take 500 mg by mouth daily.       discharge planning time 45 min.  SignedWenda Low 10/05/2013, 8:12 AM

## 2013-10-05 NOTE — Consult Note (Signed)
CARDIOLOGY CONSULT NOTE   Patient ID: Laura Zuniga MRN: [id number] DOB/AGE: 10-02-1917 80 y.o.  Admit date: 10/03/2013  Primary Physician   Wenda Low, MD Primary Cardiologist   Dr. Irish Lack Reason for Consultation   CHF, afib  CWC:BJSEG Laura Zuniga is a 80 y.o. female with a history of possible CAD. She was hospitalized 06/10-06/24 for c. Diff colitis. Cardiology saw for rapid atrial fib. She was discharged on amiodarone 200 mg BID and Cardizem CD 180 mg, both new.   Pt was having some SOB at discharge, but felt OK. After discharge, the SOB got worse. She was unable to do anything, staff were helping her get from bed to chair. Prior to 06/10 admission, she was ambulating in the halls and doing well.   Since admission, she feels her breathing has improved. However, respiratory rate is > 20, and she does not think she could lie flat. Checked O2 sats on room air and they were 92-93%. She improved with oxygen, but was not on home O2 prior to admission.   She rarely ever gets chest pain, occasional twinges only. She never has to take nitro.   Past Medical History  Diagnosis Date  . CHF (congestive heart failure)   . GERD (gastroesophageal reflux disease)   . Hypertension   . Diverticulitis   . UTI (urinary tract infection)   . Blockage of coronary artery of heart 2007    never had cardiac cath, CAD dx by positive ant. wall defect on Nuc. study.  no cath due to high risk.  . Macular degeneration      Past Surgical History  Procedure Laterality Date  . Cholecystectomy    . Appendectomy    . Vaginal hysterectomy    . Eye surgery    . Esophagogastroduodenoscopy N/A 09/22/2013    Procedure: ESOPHAGOGASTRODUODENOSCOPY (EGD);  Surgeon: Lear Ng, MD;  Location: [facility] ENDOSCOPY;  Service: Endoscopy;  Laterality: N/A;  . Flexible sigmoidoscopy N/A 09/22/2013    Procedure: FLEXIBLE SIGMOIDOSCOPY;  Surgeon: Lear Ng, MD;  Location: [place];  Service:  Endoscopy;  Laterality: N/A;  . Flexible sigmoidoscopy N/A 09/22/2013    Procedure: FLEXIBLE SIGMOIDOSCOPY;  Surgeon: Lear Ng, MD;  Location: [place];  Service: Endoscopy;  Laterality: N/A;  unprepped/ egd first  . Esophagogastroduodenoscopy N/A 09/22/2013    Procedure: ESOPHAGOGASTRODUODENOSCOPY (EGD);  Surgeon: Lear Ng, MD;  Location: [facility] ENDOSCOPY;  Service: Endoscopy;  Laterality: N/A;    Allergies  Allergen Reactions  . Penicillins     unknown  . Sulfonamide Derivatives     REACTION: Rash and swelling    I have reviewed the patient's current medications . amiodarone  200 mg Oral Daily  . clopidogrel  75 mg Oral Q breakfast  . diltiazem  180 mg Oral Daily  . escitalopram  5 mg Oral Daily  . furosemide  40 mg Oral q1800  . [START ON 10/06/2013] furosemide  80 mg Oral Q breakfast  . heparin  5,000 Units Subcutaneous 3 times per day  . sodium chloride  3 mL Intravenous Q12H  . tamsulosin  0.4 mg Oral QPC breakfast     albuterol, meclizine  Medication Sig  acetaminophen (TYLENOL) 500 MG tablet Take 500-1,000 mg by mouth every 6 (six) hours as needed for moderate pain or headache.  albuterol (PROVENTIL) (2.5 MG/3ML) 0.083% nebulizer solution Take 3 mLs (2.5 mg total) by nebulization every 6 (six) hours as needed for wheezing or shortness of breath.  alum & mag hydroxide-simeth (MAALOX/MYLANTA) 200-200-20 MG/5ML suspension Take 30 mLs by mouth every 6 (six) hours as needed for indigestion or heartburn (dyspepsia).  calcium-vitamin D (OSCAL WITH D) 500-200 MG-UNIT per tablet Take 1 tablet by mouth daily.  clopidogrel (PLAVIX) 75 MG tablet Take 1 tablet (75 mg total) by mouth daily.  Cranberry 250 MG CAPS Take 1 capsule by mouth daily.  cyanocobalamin 500 MCG tablet Take 500 mcg by mouth daily.  diltiazem (CARDIZEM CD) 180 MG 24 hr capsule Take 1 capsule (180 mg total) by mouth daily.  escitalopram (LEXAPRO) 10 MG tablet Take 5 mg by mouth daily.  ferrous  sulfate 325 (65 FE) MG tablet Take 325 mg by mouth daily with breakfast.  fidaxomicin (DIFICID) 200 MG TABS tablet Take 1 tablet (200 mg total) by mouth 2 (two) times daily. For 3 more days  levalbuterol (XOPENEX) 0.63 MG/3ML nebulizer solution Take 3 mLs (0.63 mg total) by nebulization 3 (three) times daily.  meclizine (ANTIVERT) 25 MG tablet Take 25 mg by mouth 2 (two) times daily as needed for dizziness.   Multiple Vitamin (MULITIVITAMIN WITH MINERALS) TABS Take 1 tablet by mouth daily.  pantoprazole (PROTONIX) 40 MG tablet Take 40 mg by mouth daily.  pravastatin (PRAVACHOL) 40 MG tablet Take 40 mg by mouth daily.  tamsulosin (FLOMAX) 0.4 MG CAPS capsule Take 1 capsule (0.4 mg total) by mouth daily after breakfast.  vitamin C (ASCORBIC ACID) 500 MG tablet Take 500 mg by mouth daily.  amiodarone (PACERONE) 200 MG tablet Take 1 tablet (200 mg total) by mouth daily.  furosemide (LASIX) 40 MG tablet 1 tablet daily  potassium chloride SA (K-DUR,KLOR-CON) 20 MEQ tablet      History   Social History  . Marital Status: Widowed    Spouse Name: N/A    Number of Children: N/A  . Years of Education: N/A   Occupational History  . Not on file.   Social History Main Topics  . Smoking status: Never Smoker   . Smokeless tobacco: Not on file  . Alcohol Use: No  . Drug Use: No  . Sexual Activity: No   Other Topics Concern  . Not on file   Social History Narrative        father died age 7: Mass[place] MI         mother died age 2: gallbladder surgery complications         4 brothers: all deceased; prostate ca, throat ca, emphesema, MI, cirrhosis from ETOH         3 sisters: one living: MI, emphesema, 2 sisters with breast CA    Family Status  Relation Status Death Age  . Mother Deceased 69    gallbladder complications  . Father Deceased   . Sister Alive   . Brother Deceased   . Brother Deceased   . Brother Deceased   . Brother Deceased   . Sister Deceased   . Sister Deceased     Family History  Problem Relation Age of Onset  . Heart attack Father 61  . Heart attack Sister   . Cancer Brother   . Cancer Brother   . Cirrhosis Brother   . Heart disease Brother   . Cancer Brother   . COPD Brother   . COPD Sister   . Cancer Sister   . Cancer Sister      ROS: She is weak, but feels better than on admission. Diarrhea has improved. Full 14 point review of systems  complete and found to be negative unless listed above.  Physical Exam: Blood pressure 123/57, pulse 65, temperature 97.5 F (36.4 C), temperature source Oral, resp. rate 18, height 5\' 1"  (1.549 m), weight 150 lb 12.8 oz (68.402 kg), SpO2 98.00%.  General: Well developed, well nourished, elderly female in no acute distress Head: Eyes PERRLA, No xanthomas.   Normocephalic and atraumatic, oropharynx without edema or exudate. Dentition: poor Lungs: decreased BS bilaterally, no wheezing Heart:Heart irregular rate and rhythm with S1, S2; 1/6 systolic murmur. pulses are 2+ all 4 extrem.   Neck: No carotid bruits. No lymphadenopathy.  JVD. Abdomen: Bowel sounds present, abdomen soft and non-tender without masses or hernias noted. Msk:  No spine or cva tenderness. No weakness, no joint deformities or effusions. Extremities: No clubbing or cyanosis.1+ Left and trace right edema.  Neuro: Alert and oriented X 3. No focal deficits noted. Psych:  Good affect, responds appropriately Skin: No rashes or lesions noted.  Labs:   Lab Results  Component Value Date   WBC 6.3 10/04/2013   HGB 9.6* 10/04/2013   HCT 29.9* 10/04/2013   MCV 99.3 10/04/2013   PLT 240 10/04/2013    Recent Labs  10/04/13 0410  INR 1.14    Recent Labs Lab 10/04/13 0410 10/05/13 0325  NA 139 135*  K 4.6 4.7  CL 94* 97  CO2 36* 26  BUN 12 29*  CREATININE 0.89 1.26*  CALCIUM 8.7 8.6  PROT 6.1  --   BILITOT 0.6  --   ALKPHOS 58  --   ALT 29  --   AST 36  --   GLUCOSE 91 93  ALBUMIN 2.5*  --    Magnesium  Date Value Ref Range  Status  09/28/2013 1.8  1.5 - 2.5 mg/dL Final    Recent Labs  10/03/13 1137  TROPONINI <0.30   Pro B Natriuretic peptide (BNP)  Date/Time Value Ref Range Status  10/03/2013 11:37 AM 3462.0* 0 - 450 pg/mL Final  09/27/2013  5:30 PM 4678.0* 0 - 450 pg/mL Final   Lipase  Date/Time Value Ref Range Status  09/16/2013  1:04 PM 13  11 - 59 U/L Final   TSH  Date/Time Value Ref Range Status  09/27/2013  5:20 PM 8.390* 0.350 - 4.500 uIU/mL Final    Echo: 09/28/2013 Study Conclusions - Left ventricle: Wall thickness was increased in a pattern of mild LVH. Systolic function was normal. The estimated ejection fraction was in the range of 55% to 60%. - Aortic valve: There was mild regurgitation. - Mitral valve: There was mild regurgitation. - Left atrium: The atrium was moderately dilated. - Right atrium: The atrium was mildly dilated. - Pulmonary arteries: PA peak pressure: 52 mm Hg (S).  ECG:  10/03/2013 Sinus bradycardia with 1st degree A-V block with Premature atrial complexes Low voltage QRS Prolonged QT Vent. rate 56 BPM PR interval 212 ms QRS duration 86 ms QT/QTc 492/474 ms P-R-T axes 43 7 28  Radiology:  Dg Chest 2 View 10/03/2013   CLINICAL DATA:  dyspnea dyspnea  EXAM: CHEST - 2 VIEW  COMPARISON:  09/27/2013  FINDINGS: The central line has been removed. Persistent pleural effusions left greater than right with adjacent consolidation/ atelectasis at the lung bases. Mild central pulmonary vascular congestion. Mild cardiomegaly. Surgical clips in the upper abdomen. Patchy aorta aortic calcifications.  IMPRESSION: 1. Little convincing change since previous exam in bilateral infiltrates/edema, effusions, and cardiomegaly.   Electronically Signed   By: Delories Heinz.D.  On: 10/03/2013 13:25   Dg Chest Port 1 View 10/05/2013   CLINICAL DATA:  Shortness of breath.  EXAM: PORTABLE CHEST - 1 VIEW  COMPARISON:  10/03/2013.  FINDINGS: The heart is enlarged but stable. Mild vascular  congestion and probable interstitial edema with bilateral pleural effusions and bibasilar atelectasis. No significant interval change.  IMPRESSION: Stable chest x-ray findings as above.   Electronically Signed   By: Kalman Jewels M.D.   On: 10/05/2013 11:08    ASSESSMENT AND PLAN:   The patient was seen today by Dr Claiborne Billings, the patient evaluated and the data reviewed.  Principal Problem:   Acute on chronic diastolic CHF (congestive heart failure), NYHA class 4 - Dry weight unclear. Pt on Lasix 80 mg am, 40 mg pm at last admit. Was on Lasix 40 mg daily at d/c. Has received Lasix 80 mg IV x 5 doses since admit. Weight down 1 lb. I/O negative by 1700 cc. BUN/Cr trending up. BNP lower on admission than it was last admit. Will recheck BNP. Continue IV Lasix at 40 mg BID. Would recheck BMET in am, defer d/c today.   Probable CAD - abnl stress test in 2007, never cathed. Medical management. Can add Imdur at 15 mg daily and hydralazine at 10 mg TID, increase as BP will tolerate.     Atrial fibrillation with RVR - Paroxysmal, in SR on admission. QT/QTc longer than on admission 06/10 (400/447), prior to starting amiodarone. Cardizem also new, this may contribute to CHF. Will d/c Cardizem, try low-dose metoprolol AT 12.5 BID, but has 1st degree AV block (old), so will have to watch carefully. Can increase amiodarone back to BID as she has not been fully loaded yet. Continue to follow.  Active Problems:   DEMENTIA, CCE, W/O BEHAVIORAL DISTURBANCE   DEPRESSION   HYPERTENSION   CAD   COPD (chronic obstructive pulmonary disease)   CHF (congestive heart failure)   Acute heart failure   Signed:  Rosaria Ferries, PA-C 10/05/2013 12:01 PM Beeper [phone]  Co-Sign MD   Patient seen and examined. Agree with assessment and plan.Very pleasant 80 yo WF who resides at [facility].  A review of her medical records indicates that she was found to have significant abnormality in her anterior wall on remote nuclear  imaging in 2007.  At that time, decision was made not to intervene invasively but treat medically.  She had been recently hospitalized with pseudomembranous colitis and had developed rapid atrial fibrillation.  She was discharged on 09/29/2013 but 4 days later was readmitted with increasing shortness of breath and leg swelling.  At that time, and she was readmitted in sinus rhythm on her amiodarone 200 mg twice a day and Cardizem dosing.  Subsequent, she developed recurrent atrial fibrillation.  She has been aggressively diuresis with IV furosemide and receive several doses of 80 mg intravenously.  She continues to have peripheral edema; Left leg greater than right.  BNP is elevated and is concordant with probable diastolic heart failure in light of her documented normal systolic function with ejection fraction of 55-60% on recent echocardiography.  Her serum creatinine has risen from 0.8, 9-1.26 with IV diuresis.  At present, I favor addition of very low-dose nitrates as last hydralazine, which would be helpful for probable underlying CAD and D., as well as congestive heart failure symptomatology without further worsening renal function.  It may be possible to discontinue Cardizem.  She has not been fully loaded with amiodarone and and may require  increased dosing at least short-term prior to reducing her to the 200 mg daily dose.  I discussed the scenario with her primary cardiologist, Dr. Irish Lack.  We will continue to follow her with you during her hospitalization.   Troy Sine, MD, [facility] 10/05/2013 1:36 PM

## 2013-10-05 NOTE — Telephone Encounter (Signed)
Spoke with pts daughter and she is upset because Dr. Lysle Rubens wanted to discharge her mother this morning. So she called to speak with Dr. Lysle Rubens and he decided to not discharge her mother and keep her at the hospital. I told the pts daughter that cardiology has been consulted.

## 2013-10-05 NOTE — Telephone Encounter (Signed)
New Message  Daughter called for pt.. States the pt was recently sent to the hospital because she couldn't breath.. She states that her legs, feet and hands are swollen. Pt daughter says that Dr. Lorenda Hatchet has advised to "not call 911 every time something happens". Daughter also says that the pt is at the hospital now still has fluid and is still swollen and they are about to discharge her.. She also states that a chest x ray was not completed. She believes that her mother is being neglected. Requests a call back to discuss further.

## 2013-10-05 NOTE — Progress Notes (Signed)
Subjective: Patient with acute on chronic heart failure diuresis with IV Lasix , iincreased creatinine this 1.2 Discussed with the daughter patient not ready for discharge today.   Objective: Vital signs in last 24 hours: Temp:  [97.5 F (36.4 C)-97.9 F (36.6 C)] 97.5 F (36.4 C) (06/29 0532) Pulse Rate:  [60-65] 60 (06/29 0532) Resp:  [20-22] 22 (06/29 0532) BP: (128-148)/(50-79) 138/79 mmHg (06/29 0532) SpO2:  [96 %-98 %] 96 % (06/29 0532) Weight:  [68.402 kg (150 lb 12.8 oz)] 68.402 kg (150 lb 12.8 oz) (06/29 0532) Weight change: 2.631 kg (5 lb 12.8 oz) Last BM Date: 10/04/13  Intake/Output from previous day: 06/28 0701 - 06/29 0700 In: 240 [P.O.:240] Out: 950 [Urine:950] Intake/Output this shift: Total I/O In: 360 [P.O.:360] Out: -   General appearance: alert Resp: clear to auscultation bilaterally Cardio: irregularly irregular rhythm  Lab Results:  Recent Labs  10/04/13 0410 10/04/13 1208  WBC 5.7 6.3  HGB 9.3* 9.6*  HCT 28.8* 29.9*  PLT 234 240   BMET  Recent Labs  10/04/13 0410 10/05/13 0325  NA 139 135*  K 4.6 4.7  CL 94* 97  CO2 36* 26  GLUCOSE 91 93  BUN 12 29*  CREATININE 0.89 1.26*  CALCIUM 8.7 8.6    Studies/Results: Dg Chest 2 View  10/03/2013   CLINICAL DATA:  dyspnea dyspnea  EXAM: CHEST - 2 VIEW  COMPARISON:  09/27/2013  FINDINGS: The central line has been removed. Persistent pleural effusions left greater than right with adjacent consolidation/ atelectasis at the lung bases. Mild central pulmonary vascular congestion. Mild cardiomegaly. Surgical clips in the upper abdomen. Patchy aorta aortic calcifications.  IMPRESSION: 1. Little convincing change since previous exam in bilateral infiltrates/edema, effusions, and cardiomegaly.   Electronically Signed   By: Arne Cleveland M.D.   On: 10/03/2013 13:25    Medications: I have reviewed the patient's current medications.  Assessment/Plan: Principal Problem:  Acute on chronic diastolic  CHF (congestive heart failure), NYHA class 4 - improved. chage Lasix to p.o. Chest x-ray today and cardiology  Repeat renal function in the morning. Active Problems:  DEMENTIA, CCE, W/O BEHAVIORAL DISTURBANCE  DEPRESSION  HYPERTENSION  CAD  COPD (chronic obstructive pulmonary disease)  Atrial fibrillation with RVR - decrease amiodarone to once a day CHF (congestive heart failure)  Acute heart failure   LOS: 2 days   HUSAIN,KARRAR 10/05/2013, 10:10 AM

## 2013-10-06 LAB — BASIC METABOLIC PANEL
BUN: 13 mg/dL (ref 6–23)
CALCIUM: 8.8 mg/dL (ref 8.4–10.5)
CO2: 34 mEq/L — ABNORMAL HIGH (ref 19–32)
CREATININE: 0.9 mg/dL (ref 0.50–1.10)
Chloride: 91 mEq/L — ABNORMAL LOW (ref 96–112)
GFR calc Af Amer: 61 mL/min — ABNORMAL LOW (ref >90)
GFR, EST NON AFRICAN AMERICAN: 53 mL/min — AB (ref >90)
GLUCOSE: 99 mg/dL (ref 70–99)
Potassium: 3.9 mEq/L (ref 3.7–5.3)
Sodium: 134 mEq/L — ABNORMAL LOW (ref 137–147)

## 2013-10-06 MED ORDER — METOPROLOL SUCCINATE ER 25 MG PO TB24
25.0000 mg | ORAL_TABLET | Freq: Every day | ORAL | Status: DC
  Administered 2013-10-06: 25 mg via ORAL
  Filled 2013-10-06 (×2): qty 1

## 2013-10-06 MED ORDER — POTASSIUM CHLORIDE CRYS ER 20 MEQ PO TBCR
20.0000 meq | EXTENDED_RELEASE_TABLET | Freq: Every day | ORAL | Status: DC
  Administered 2013-10-06 – 2013-10-11 (×6): 20 meq via ORAL
  Filled 2013-10-06 (×6): qty 1

## 2013-10-06 MED ORDER — FUROSEMIDE 10 MG/ML IJ SOLN
80.0000 mg | Freq: Two times a day (BID) | INTRAMUSCULAR | Status: DC
  Administered 2013-10-06 – 2013-10-10 (×9): 80 mg via INTRAVENOUS
  Filled 2013-10-06 (×12): qty 8

## 2013-10-06 NOTE — Progress Notes (Signed)
Subjective: Breathing is better  NO CP   Objective: Filed Vitals:   10/05/13 1023 10/05/13 1625 10/05/13 2100 10/06/13 0557  BP: 123/57 120/37 132/63 123/46  Pulse: 65 58 50 66  Temp:  98.3 F (36.8 C) 98.4 F (36.9 C) 98 F (36.7 C)  TempSrc:  Oral Oral Oral  Resp: 18 20 20 17   Height:      Weight:    146 lb 9.7 oz (66.5 kg)  SpO2: 98% 99% 97% 97%   Weight change: -4 lb 3.1 oz (-1.903 kg)  Intake/Output Summary (Last 24 hours) at 10/06/13 0754 Last data filed at 10/05/13 2208  Gross per 24 hour  Intake    483 ml  Output    400 ml  Net     83 ml   I/O net -1.977 General: Alert, awake, oriented x3, in no acute distress Neck:  JVP is normal Heart: Regular rate and rhythm, without murmurs, rubs, gallops.  Lungs: Clear to auscultation.  No rales or wheezes. Exemities:  1+ edema.   Neuro: Grossly intact, nonfocal.   Lab Results: Results for orders placed during the hospital encounter of 10/03/13 (from the past 24 hour(s))  PRO B NATRIURETIC PEPTIDE     Status: Abnormal   Collection Time    10/05/13  1:11 PM      Result Value Ref Range   Pro B Natriuretic peptide (BNP) 2305.0 (*) 0 - 450 pg/mL  BASIC METABOLIC PANEL     Status: Abnormal   Collection Time    10/06/13  4:26 AM      Result Value Ref Range   Sodium 134 (*) 137 - 147 mEq/L   Potassium 3.9  3.7 - 5.3 mEq/L   Chloride 91 (*) 96 - 112 mEq/L   CO2 34 (*) 19 - 32 mEq/L   Glucose, Bld 99  70 - 99 mg/dL   BUN 13  6 - 23 mg/dL   Creatinine, Ser 0.90  0.50 - 1.10 mg/dL   Calcium 8.8  8.4 - 10.5 mg/dL   GFR calc non Af Amer 53 (*) >90 mL/min   GFR calc Af Amer 61 (*) >90 mL/min    Studies/Results: Dg Chest Port 1 View  10/05/2013   CLINICAL DATA:  Shortness of breath.  EXAM: PORTABLE CHEST - 1 VIEW  COMPARISON:  10/03/2013.  FINDINGS: The heart is enlarged but stable. Mild vascular congestion and probable interstitial edema with bilateral pleural effusions and bibasilar atelectasis. No significant interval  change.  IMPRESSION: Stable chest x-ray findings as above.   Electronically Signed   By: Kalman Jewels M.D.   On: 10/05/2013 11:08    Medications: Reviewed   @PROBHOSP @  1.  Acute on chornic diastolic CHF  Still with volume increase on exam  Continue IV lasix  Will increase dose  Will stop cardiazem    2.  Atrial fib.  Remains in SR  On amiodarone 200 qd   Will increase to bid while in hospital  3.  PRobableCAD  No symptoms to suggest angina.      LOS: 3 days   Dorris Carnes 10/06/2013, 7:54 AM

## 2013-10-06 NOTE — Progress Notes (Signed)
Patient evaluated for community based chronic disease management services with [facility] as a benefit of patient's [employer]. Spoke with patient and daughter at bedside to explain [facility] services.  Services have been accepted with written consent.  HCPOA is her daughter [name] Home: [phone] or mobile: [phone].  Patient is 80 years old lives alone.  Her plan is to return to home alone with family support following her SNF rehabilitation.  Patient will receive a post discharge transition of care call and will be evaluated for monthly home visits for assessments and CHF disease process education.  Left contact information and [facility] literature at bedside. Made Inpatient Case Manager aware that [facility] following. Of note, [facility] Care Management services does not replace or interfere with any services that are arranged by inpatient case management or social work.  For additional questions or referrals please contact Corliss Blacker BSN RN [facility] Liaison at [phone].

## 2013-10-06 NOTE — Care Management Note (Signed)
    Page 1 of 1   10/06/2013     12:03:05 PM CARE MANAGEMENT NOTE 10/06/2013  Patient:  Laura Zuniga, Laura Zuniga   Account Number:  [id number]  Date Initiated:  10/06/2013  Documentation initiated by:  [facility]  Subjective/Objective Assessment:   80 y.o. female with a history of possible CAD. She was hospitalized 06/10-06/24 for c. Diff colitis. Cardiology saw for rapid atrial fib, CHF//From [facility]     Action/Plan:   diuresis with IV Lasix.// Assist with return to [facility].   Anticipated DC Date:  10/08/2013   Anticipated DC Plan:  SKILLED NURSING FACILITY  In-house referral  Clinical Social Worker         Choice offered to / List presented to:             Status of service:   Medicare Important Message given?  YES (If response is "NO", the following Medicare IM given date fields will be blank) Date Medicare IM given:  10/06/2013 Medicare IM given by:  [facility] Date Additional Medicare IM given:   Additional Medicare IM given by:    Discharge Disposition:    Per UR Regulation:  Reviewed for med. necessity/level of care/duration of stay  If discussed at [place] of Stay Meetings, dates discussed:    Comments:

## 2013-10-06 NOTE — Progress Notes (Signed)
Subjective: Some cough  Objective: Vital signs in last 24 hours: Temp:  [98 F (36.7 C)-98.4 F (36.9 C)] 98 F (36.7 C) (06/30 0557) Pulse Rate:  [50-66] 66 (06/30 0557) Resp:  [17-20] 17 (06/30 0557) BP: (120-132)/(37-63) 123/46 mmHg (06/30 0557) SpO2:  [97 %-99 %] 97 % (06/30 0557) Weight:  [66.5 kg (146 lb 9.7 oz)] 66.5 kg (146 lb 9.7 oz) (06/30 0557) Weight change: -1.903 kg (-4 lb 3.1 oz) Last BM Date: 10/05/13  Intake/Output from previous day: 06/29 0701 - 06/30 0700 In: 483 [P.O.:480; I.V.:3] Out: 400 [Urine:400] Intake/Output this shift:    General appearance: alert Resp: diminished breath sounds bibasilar Cardio: regular rate and rhythm GI: soft, non-tender; bowel sounds normal; no masses,  no organomegaly Extremities: edema +2  Lab Results:  Recent Labs  10/04/13 0410 10/04/13 1208  WBC 5.7 6.3  HGB 9.3* 9.6*  HCT 28.8* 29.9*  PLT 234 240   BMET  Recent Labs  10/05/13 0325 10/06/13 0426  NA 135* 134*  K 4.7 3.9  CL 97 91*  CO2 26 34*  GLUCOSE 93 99  BUN 29* 13  CREATININE 1.26* 0.90  CALCIUM 8.6 8.8    Studies/Results: Dg Chest Port 1 View  10/05/2013   CLINICAL DATA:  Shortness of breath.  EXAM: PORTABLE CHEST - 1 VIEW  COMPARISON:  10/03/2013.  FINDINGS: The heart is enlarged but stable. Mild vascular congestion and probable interstitial edema with bilateral pleural effusions and bibasilar atelectasis. No significant interval change.  IMPRESSION: Stable chest x-ray findings as above.   Electronically Signed   By: Kalman Jewels M.D.   On: 10/05/2013 11:08    Medications: I have reviewed the patient's current medications.  Assessment/Plan: Principal Problem:  Acute on chronic diastolic CHF (congestive heart failure), NYHA class 4 -per cardiology- med change Chest x-ray today and cardiology  Repeat renal function in the morning.  Active Problems:  DEMENTIA, CCE, W/O BEHAVIORAL DISTURBANCE  DEPRESSION  HYPERTENSION  CAD  COPD  (chronic obstructive pulmonary disease)  Atrial fibrillation with RVR - decrease amiodarone to once a day  CHF (congestive heart failure)  Acute heart failure SNF- rehab  In next 1- 2 day   LOS: 3 days   HUSAIN,KARRAR 10/06/2013, 7:30 AM

## 2013-10-06 NOTE — Plan of Care (Signed)
Problem: Phase I Progression Outcomes Goal: EF % per last Echo/documented,Core Reminder form on chart Outcome: Completed/Met Date Met:  10/06/13 Echo performed on 09/28/2013 Echo result 55-60%

## 2013-10-06 NOTE — Evaluation (Signed)
Physical Therapy Evaluation Patient Details Name: Laura Zuniga MRN: [id number] DOB: 22-Mar-1918 Today's Date: 10/06/2013   History of Present Illness  Patient is a 80 y/o female admitted 6/10-6/24 for pseudomembranous colitis/sigmoid diverticulosis,  Cad without Cath 2007, Grade 1 CKD? From [place] facility after just being discharged.  Clinical Impression  Patient presents with decreased independence with mobility due to deficits listed in PT problem list.  She will benefit from skilled PT in the acute setting to allow return to independent following STSNF rehab stay.  Recently from [facility].    Follow Up Recommendations SNF    Equipment Recommendations  None recommended by PT    Recommendations for Other Services       Precautions / Restrictions Precautions Precautions: Fall Precaution Comments: pt on 2L o2      Mobility  Bed Mobility               General bed mobility comments: NT patient up in chair  Transfers Overall transfer level: Needs assistance Equipment used: Rolling walker (2 wheeled) Transfers: Sit to/from Stand Sit to Stand: Min assist         General transfer comment: cues to scoot to edge of bed and assist to lift from seat  Ambulation/Gait Ambulation/Gait assistance: Min guard Ambulation Distance (Feet): 120 Feet Assistive device: Rolling walker (2 wheeled) Gait Pattern/deviations: Step-through pattern;Decreased stride length;Shuffle     General Gait Details: slow and assist with turning  Stairs            Wheelchair Mobility    Modified Rankin (Stroke Patients Only)       Balance Overall balance assessment: Needs assistance         Standing balance support: Bilateral upper extremity supported;Single extremity supported Standing balance-Leahy Scale: Poor Standing balance comment: walked without walker about 3' to chair pt holding furniture                             Pertinent  Vitals/Pain Denies pain; SpO2 93% ambulating on 2L O2 [place] HR 91    Home Living Family/patient expects to be discharged to:: Skilled nursing facility Living Arrangements: Alone               Additional Comments: was living alone prior to last hospitalization; daughter reports was managing ADL's including cooking    Prior Function Level of Independence: Independent with assistive device(s)         Comments: gardening; only uses rollator when goes out shopping with daughter     Hand Dominance        Extremity/Trunk Assessment               Lower Extremity Assessment: Generalized weakness         Communication   Communication: No difficulties  Cognition Arousal/Alertness: Awake/alert Behavior During Therapy: WFL for tasks assessed/performed Overall Cognitive Status: History of cognitive impairments - at baseline                      General Comments General comments (skin integrity, edema, etc.): extreme dyspnea after MMT in chair with cues for pursed lip breathing to catch up    Exercises        Assessment/Plan    PT Assessment Patient needs continued PT services  PT Diagnosis Abnormality of gait;Generalized weakness   PT Problem List Decreased strength;Decreased activity tolerance;Decreased balance;Decreased mobility;Decreased safety awareness;Decreased knowledge of use of DME;Cardiopulmonary  status limiting activity  PT Treatment Interventions DME instruction;Therapeutic exercise;Gait training;Functional mobility training;Therapeutic activities;Patient/family education;Balance training   PT Goals (Current goals can be found in the Care Plan section) Acute Rehab PT Goals Patient Stated Goal: return to her home alone PT Goal Formulation: With patient/family Time For Goal Achievement: 10/20/13 Potential to Achieve Goals: Good    Frequency Min 2X/week   Barriers to discharge Decreased caregiver support      Co-evaluation                End of Session Equipment Utilized During Treatment: Gait belt;Oxygen Activity Tolerance: Patient limited by fatigue Patient left: in chair;with call bell/phone within reach;with family/visitor present           Time: 1049-1110 PT Time Calculation (min): 21 min   Charges:   PT Evaluation $Initial PT Evaluation Tier I: 1 Procedure PT Treatments $Gait Training: 8-22 mins   PT G Codes:          WYNN,CYNDI 10-07-13, 11:16 AM Magda Kiel, [place] 10-07-2013

## 2013-10-07 LAB — BASIC METABOLIC PANEL
BUN: 12 mg/dL (ref 6–23)
CALCIUM: 8.7 mg/dL (ref 8.4–10.5)
CO2: 34 mEq/L — ABNORMAL HIGH (ref 19–32)
Chloride: 92 mEq/L — ABNORMAL LOW (ref 96–112)
Creatinine, Ser: 0.88 mg/dL (ref 0.50–1.10)
GFR, EST AFRICAN AMERICAN: 63 mL/min — AB (ref >90)
GFR, EST NON AFRICAN AMERICAN: 55 mL/min — AB (ref >90)
GLUCOSE: 99 mg/dL (ref 70–99)
Potassium: 4.3 mEq/L (ref 3.7–5.3)
Sodium: 135 mEq/L — ABNORMAL LOW (ref 137–147)

## 2013-10-07 MED ORDER — ACETAMINOPHEN 325 MG PO TABS
650.0000 mg | ORAL_TABLET | Freq: Four times a day (QID) | ORAL | Status: DC | PRN
  Administered 2013-10-07: 650 mg via ORAL
  Filled 2013-10-07: qty 2

## 2013-10-07 MED ORDER — IPRATROPIUM-ALBUTEROL 0.5-2.5 (3) MG/3ML IN SOLN: 3.0000 mL | RESPIRATORY_TRACT | Status: DC | PRN

## 2013-10-07 MED ORDER — ALBUTEROL SULFATE (2.5 MG/3ML) 0.083% IN NEBU: 2.5000 mg | INHALATION_SOLUTION | Freq: Two times a day (BID) | RESPIRATORY_TRACT | Status: DC

## 2013-10-07 NOTE — Progress Notes (Signed)
Subjective: Overnight - Low HR noted episode- in low 40's DOE   Objective: Vital signs in last 24 hours: Temp:  [97.7 F (36.5 C)-98 F (36.7 C)] 98 F (36.7 C) (07/01 0536) Pulse Rate:  [62-69] 62 (07/01 0536) Resp:  [18-20] 20 (07/01 0536) BP: (132-135)/(50-70) 133/70 mmHg (07/01 0536) SpO2:  [92 %-98 %] 98 % (07/01 0536) Weight:  [66.633 kg (146 lb 14.4 oz)] 66.633 kg (146 lb 14.4 oz) (07/01 0602) Weight change: 0.134 kg (4.7 oz) Last BM Date: 10/06/13  Intake/Output from previous day: 06/30 0701 - 07/01 0700 In: 960 [P.O.:960] Out: 1803 [Urine:1800; Stool:3] Intake/Output this shift:    General appearance: alert Resp: wheezes bibasilar Cardio: regular rate and rhythm GI: soft, non-tender; bowel sounds normal; no masses,  no organomegaly  Lab Results:  Recent Labs  10/04/13 1208  WBC 6.3  HGB 9.6*  HCT 29.9*  PLT 240   BMET  Recent Labs  10/06/13 0426 10/07/13 0432  NA 134* 135*  K 3.9 4.3  CL 91* 92*  CO2 34* 34*  GLUCOSE 99 99  BUN 13 12  CREATININE 0.90 0.88  CALCIUM 8.8 8.7    Studies/Results: Dg Chest Port 1 View  10/05/2013   CLINICAL DATA:  Shortness of breath.  EXAM: PORTABLE CHEST - 1 VIEW  COMPARISON:  10/03/2013.  FINDINGS: The heart is enlarged but stable. Mild vascular congestion and probable interstitial edema with bilateral pleural effusions and bibasilar atelectasis. No significant interval change.  IMPRESSION: Stable chest x-ray findings as above.   Electronically Signed   By: Kalman Jewels M.D.   On: 10/05/2013 11:08    Medications: I have reviewed the patient's current medications.  Assessment/Plan: Principal Problem:  Acute on chronic diastolic CHF (congestive heart failure), NYHA class 4 -per cardiology- med change  Low HR- with current med- defer to Cardiology - Med adjust- ? Change Lasix to PO Renal function stable; K - ok Active Problems:  DEMENTIA, , W/O BEHAVIORAL DISTURBANCE  DEPRESSION  HYPERTENSION  CAD   Episode of urinary retention- resolved- off Flomax now. Recent colitis- resolved- off ABX COPD (chronic obstructive pulmonary disease) - some component of COPD (chronic bronchitis)- on amiodarone and BB- will watch closely-  Neb bid and prn Atrial fibrillation - now on NSR Decondition: PT - will need SNF- rehab SNF- rehab - once cardiac issue improve.   LOS: 4 days   Arilynn Blakeney 10/07/2013, 7:22 AM

## 2013-10-07 NOTE — Progress Notes (Signed)
Patient states she is having difficulty sleeping. Nurse went in room and notice that patient appeared scared. Pt stated she got scared cause it was dark and needed to go to the restroom. Notified Dr. Lysle Rubens. Also notified doctor of patient's heart rate, it drops down to the 40's but does not sustain. Orders given to administer tylenol. Will carry out orders and continue to monitor patient to end of shift.

## 2013-10-07 NOTE — Progress Notes (Signed)
Notified Dr. Idolina Primer of patient's heart rate dropping to the mid 40's and sustaining. Was notified by CMT that when patient's heart rate initially dropped to the mid 40's, her arrythmia was showing in and out of junctional rhythm, WAPs, multifocal QRS, and PACs. Notified Dr. Idolina Primer of this as well. Orders given to continue to monitor patient and hold Metoprolol and to not give until Cardiology make their rounds and evaluate patient. Notified oncoming nurse of the above. Nurse signing off at this time.

## 2013-10-07 NOTE — Progress Notes (Signed)
Subjective: Breathing is better  No CP   Objective: Filed Vitals:   10/06/13 1500 10/06/13 2151 10/07/13 0536 10/07/13 0602  BP: 135/59 133/50 133/70   Pulse: 66 69 62   Temp: 98 F (36.7 C) 97.7 F (36.5 C) 98 F (36.7 C)   TempSrc: Oral Oral Oral   Resp: 18 18 20    Height:      Weight:    146 lb 14.4 oz (66.633 kg)  SpO2: 97% 95% 98%    Weight change: 4.7 oz (0.134 kg)  Intake/Output Summary (Last 24 hours) at 10/07/13 0738 Last data filed at 10/07/13 0211  Gross per 24 hour  Intake    960 ml  Output   1803 ml  Net   -843 ml   Net negative:  2.8 L  General: Alert, awake, oriented x3, in no acute distress Neck:  JVP is normal Heart: Regular rate and rhythm, without murmurs, rubs, gallops.  Lungs: Clear to auscultation.  No rales or wheezes. Exemities:  Trdema.   Neuro: Grossly intact, nonfocal.  Tele:  SB and SR  Lab Results: Results for orders placed during the hospital encounter of 10/03/13 (from the past 24 hour(s))  BASIC METABOLIC PANEL     Status: Abnormal   Collection Time    10/07/13  4:32 AM      Result Value Ref Range   Sodium 135 (*) 137 - 147 mEq/L   Potassium 4.3  3.7 - 5.3 mEq/L   Chloride 92 (*) 96 - 112 mEq/L   CO2 34 (*) 19 - 32 mEq/L   Glucose, Bld 99  70 - 99 mg/dL   BUN 12  6 - 23 mg/dL   Creatinine, Ser 0.88  0.50 - 1.10 mg/dL   Calcium 8.7  8.4 - 10.5 mg/dL   GFR calc non Af Amer 55 (*) >90 mL/min   GFR calc Af Amer 63 (*) >90 mL/min    Studies/Results: No results found.  Medications: Reviewed   @PROBHOSP @ 1  Acute on chronic diastolic CHFImproving but still with some increase on exam  I would cintineu diuresis  Spoke with daughter who is here.  They will review diet at [facility]    2.  Afib  REmains in SR   SB  HR in 40s at night  Will d/c metoprolol  Follow HR.    3.  Probable CAD  No symtpoms of angina.    LOS: 4 days   Dorris Carnes 10/07/2013, 7:38 AM

## 2013-10-08 DIAGNOSIS — I2510 Atherosclerotic heart disease of native coronary artery without angina pectoris: Secondary | ICD-10-CM

## 2013-10-08 DIAGNOSIS — J4489 Other specified chronic obstructive pulmonary disease: Secondary | ICD-10-CM

## 2013-10-08 DIAGNOSIS — I5032 Chronic diastolic (congestive) heart failure: Secondary | ICD-10-CM

## 2013-10-08 DIAGNOSIS — J449 Chronic obstructive pulmonary disease, unspecified: Secondary | ICD-10-CM

## 2013-10-08 LAB — BASIC METABOLIC PANEL
ANION GAP: 7 (ref 5–15)
BUN: 13 mg/dL (ref 6–23)
CALCIUM: 8.8 mg/dL (ref 8.4–10.5)
CHLORIDE: 95 meq/L — AB (ref 96–112)
CO2: 36 mEq/L — ABNORMAL HIGH (ref 19–32)
CREATININE: 0.83 mg/dL (ref 0.50–1.10)
GFR calc Af Amer: 68 mL/min — ABNORMAL LOW (ref >90)
GFR, EST NON AFRICAN AMERICAN: 59 mL/min — AB (ref >90)
Glucose, Bld: 92 mg/dL (ref 70–99)
Potassium: 4.5 mEq/L (ref 3.7–5.3)
Sodium: 138 mEq/L (ref 137–147)

## 2013-10-08 NOTE — Progress Notes (Signed)
Pt's HCPOA, [name]), would like to postpone her mother's discharge to [facility] until Monday (10/12/13). She states the holiday weekend is a bad time for her discharge due to lack of certain equipment at [facility]. Pt's HCPOA has agreed to come to pt's room by 0700 AM Friday morning to speak with MDs. HCPOA's cell phone number is [phone].

## 2013-10-08 NOTE — Progress Notes (Addendum)
Patient will not be d/c'd this weekend per MD note.  CSW met with 2 of patient's daughters this morning- Hassan Rowan stated that MD related she was not medically stable for d/c over the weekend.  Family would prefer that she not be d/c'd over the weekend. CSW notified Kathlynn Grate, Admissions Director of [place] of above. Lorie Phenix. [place], [place]

## 2013-10-08 NOTE — Progress Notes (Signed)
Subjective: Feel much better; slept well Leg edema better.  Objective: Vital signs in last 24 hours: Temp:  [97.4 F (36.3 C)-98.8 F (37.1 C)] 98.8 F (37.1 C) (07/02 0624) Pulse Rate:  [59-64] 64 (07/02 0624) Resp:  [18-20] 18 (07/02 0624) BP: (128-135)/(55-69) 135/69 mmHg (07/02 0624) SpO2:  [97 %-99 %] 97 % (07/02 0624) Weight:  [65.318 kg (144 lb)] 65.318 kg (144 lb) (07/02 0624) Weight change: -1.315 kg (-2 lb 14.4 oz) Last BM Date: 10/06/13  Intake/Output from previous day: 07/01 0701 - 07/02 0700 In: 960 [P.O.:960] Out: 2579 [Urine:2575; Stool:4] Intake/Output this shift:    General appearance: alert Resp: clear to auscultation bilaterally Cardio: irregularly irregular rhythm Extremities: extremities normal, atraumatic, no cyanosis or edema  Lab Results: No results found for this basename: WBC, HGB, HCT, PLT,  in the last 72 hours BMET  Recent Labs  10/07/13 0432 10/08/13 0416  NA 135* 138  K 4.3 4.5  CL 92* 95*  CO2 34* 36*  GLUCOSE 99 92  BUN 12 13  CREATININE 0.88 0.83  CALCIUM 8.7 8.8    Studies/Results: No results found.  Medications: I have reviewed the patient's current medications.  Assessment/Plan: Principal Problem:  Acute on chronic diastolic CHF (congestive heart failure), NYHA class 4 -per cardiology- med change  HR better off BB; switch Lasix to PO today - Euvolemic-  Renal function stable; K - ok  Active Problems:  DEMENTIA, , W/O BEHAVIORAL DISTURBANCE  DEPRESSION  HYPERTENSION  CAD  Episode of urinary retention- resolved- off Flomax now.  Recent colitis- resolved- off ABX  COPD (chronic obstructive pulmonary disease) - some component of COPD (chronic bronchitis)- on - Neb  prn  Atrial fibrillation - in and out of NSR and afib- rate control Decondition: PT - will need SNF- rehab  SNF- rehab - anticipate d/c to SNF tomorrow- if ok with cardiology.   LOS: 5 days   Esdras Delair 10/08/2013, 7:17 AM

## 2013-10-08 NOTE — Progress Notes (Addendum)
Subjective: Breathing is better.   Objective: Filed Vitals:   10/07/13 2053 10/08/13 0623 10/08/13 0624 10/08/13 0755  BP: 132/64 135/69 135/69 165/67  Pulse: 62  64 64  Temp: 98.1 F (36.7 C)  98.8 F (37.1 C)   TempSrc: Oral  Oral   Resp: 20  18   Height:      Weight:   144 lb (65.318 kg)   SpO2: 99%  97% 92%   Weight change: -2 lb 14.4 oz (-1.315 kg)  Intake/Output Summary (Last 24 hours) at 10/08/13 0801 Last data filed at 10/08/13 0126  Gross per 24 hour  Intake    960 ml  Output   2579 ml  Net  -1619 ml   Net negative 4.4 L  General: Alert, awake, oriented x3, in no acute distress Neck:  JVP is normal Heart: Regular rate and rhythm, without murmurs, rubs, gallops.  Lungs: Decreased BS at bases  Exemities:  1+edema.   Neuro: Grossly intact, nonfocal.  TEkE  SR    Lab Results: Results for orders placed during the hospital encounter of 10/03/13 (from the past 24 hour(s))  BASIC METABOLIC PANEL     Status: Abnormal   Collection Time    10/08/13  4:16 AM      Result Value Ref Range   Sodium 138  137 - 147 mEq/L   Potassium 4.5  3.7 - 5.3 mEq/L   Chloride 95 (*) 96 - 112 mEq/L   CO2 36 (*) 19 - 32 mEq/L   Glucose, Bld 92  70 - 99 mg/dL   BUN 13  6 - 23 mg/dL   Creatinine, Ser 0.83  0.50 - 1.10 mg/dL   Calcium 8.8  8.4 - 10.5 mg/dL   GFR calc non Af Amer 59 (*) >90 mL/min   GFR calc Af Amer 68 (*) >90 mL/min   Anion gap 7  5 - 15    Studies/Results: No results found.  Medications: Reviewed   @PROBHOSP @  1,  Acute on chronic diastolic CHF.  Volume improving but still up  I would give lasix IV today  Check in am  Getting closer to d/c  2.  Afib  REmains on amio  200 qd  3.  Probable CAD  Asymptomatic.  Patient getting closer to d/c  Follow  LOS: 5 days   Laura Zuniga 10/08/2013, 8:01 AM

## 2013-10-08 NOTE — Progress Notes (Signed)
Per MD note- plan d/c of patient back to [facility] tomorrow if stable. Bed will be available. CSW will discuss with patient and daughters to make final d/c arrangements.  Lorie Phenix. [place], [place]

## 2013-10-08 NOTE — Progress Notes (Signed)
CSW met with patient and 2 of her daughters-they are aware of planned d/c tomorrow if medically stable and agree to same.  Bed will be available per Ivin Booty- Admissions at [facility]. Can also accept on Saturday if necessary but would require DC summary by Friday to order medications.  CSW will re-evaluate in the a.m.  Lorie Phenix. [place], [place]

## 2013-10-08 NOTE — Clinical Social Work Psychosocial (Addendum)
    Clinical Social Work Department BRIEF PSYCHOSOCIAL ASSESSMENT  10/05/2013  Patient:  Laura Zuniga, Laura Zuniga     Account Number:  [id number]     Admit date:  10/03/2013  Clinical Social Worker:  Iona Coach  Date/Time:  10/05/2013 01:00 PM  Referred by:  Physician  Date Referred:  10/05/2013 Referred for  Other - See comment   Other Referral:   Return to SNF   Interview type:  Other - See comment Other interview type:   Patient and daughter Hassan Rowan.  Telephonic discussion with daughter [name]    PSYCHOSOCIAL DATA Living Status:  FACILITY Admitted from facility:  [facility] Level of care:  [place] Primary support name:  [name]  [phone] Primary support relationship to patient:  CHILD, ADULT Degree of support available:   Strong support all 3 daughters  Daughter Hassan Rowan    CURRENT CONCERNS Current Concerns  Other - See comment   Other Concerns:   Return to SNF    SOCIAL WORK ASSESSMENT / PLAN 80 year old female- resident of [place]- patient was scheduled per MD for return to [facility] today- however, concerns verbalized by her daughter [name] Education officer, community) who spoke to MD determined that further hospitalization was indicated. Daughterwants Cardiology has be invovled.  CSW spoke to Jolyne Loa- Admissions at [facility]- will accept patient back when medically stable.  Fl2 placed on chart for MD's signature.   Assessment/plan status:  Psychosocial Support/Ongoing Assessment of Needs Other assessment/ plan:   Information/referral to community resources:   None    PATIENT'S/FAMILY'S RESPONSE TO PLAN OF CARE: Patient is very pleasant, she is alert and oriented lady who was noted to be sitting up in the chair and was welcoming to [place].  She stated that she likes [facility] and wants to return there. Her daughter Hassan Rowan stated that they were pleased with the care at facility and wants her to return there. Daughter [name]- whom CSW spoke to via  telephone wanted to make sure her mother was medically stable and stronger before d/c is ordered and provided advocacy for her mother with the MD's.

## 2013-10-09 LAB — BASIC METABOLIC PANEL
Anion gap: 9 (ref 5–15)
BUN: 17 mg/dL (ref 6–23)
CALCIUM: 8.8 mg/dL (ref 8.4–10.5)
CO2: 35 mEq/L — ABNORMAL HIGH (ref 19–32)
CREATININE: 0.91 mg/dL (ref 0.50–1.10)
Chloride: 93 mEq/L — ABNORMAL LOW (ref 96–112)
GFR, EST AFRICAN AMERICAN: 61 mL/min — AB (ref >90)
GFR, EST NON AFRICAN AMERICAN: 52 mL/min — AB (ref >90)
GLUCOSE: 103 mg/dL — AB (ref 70–99)
POTASSIUM: 4.9 meq/L (ref 3.7–5.3)
Sodium: 137 mEq/L (ref 137–147)

## 2013-10-09 LAB — PRO B NATRIURETIC PEPTIDE: Pro B Natriuretic peptide (BNP): 2628 pg/mL — ABNORMAL HIGH (ref 0–450)

## 2013-10-09 NOTE — Progress Notes (Signed)
Subjective: Denies dyspnea or chest pain Objective: Filed Vitals:   10/08/13 0755 10/08/13 1430 10/08/13 2058 10/09/13 0543  BP: 165/67 136/49 149/68 146/50  Pulse: 64 71 69 70  Temp:  97.9 F (36.6 C) 97.9 F (36.6 C) 98.4 F (36.9 C)  TempSrc:  Oral Oral Oral  Resp:  18 18 18   Height:      Weight:    141 lb 12.1 oz (64.3 kg)  SpO2: 92% 93% 95% 96%   Weight change: -2 lb 3.9 oz (-1.018 kg)  Intake/Output Summary (Last 24 hours) at 10/09/13 0830 Last data filed at 10/08/13 2100  Gross per 24 hour  Intake   1160 ml  Output   1002 ml  Net    158 ml     General: Alert, awake, oriented x3, in no acute distress Neck:  Supple Heart: RRR Lungs: Decreased BS at bases  Abd: soft, not tender Exemities:  1+edema.   Neuro: Grossly intact, nonfocal.  TEkE  SR    Lab Results: Results for orders placed during the hospital encounter of 10/03/13 (from the past 24 hour(s))  PRO B NATRIURETIC PEPTIDE     Status: Abnormal   Collection Time    10/09/13 12:04 AM      Result Value Ref Range   Pro B Natriuretic peptide (BNP) 2628.0 (*) 0 - 450 pg/mL  BASIC METABOLIC PANEL     Status: Abnormal   Collection Time    10/09/13 12:04 AM      Result Value Ref Range   Sodium 137  137 - 147 mEq/L   Potassium 4.9  3.7 - 5.3 mEq/L   Chloride 93 (*) 96 - 112 mEq/L   CO2 35 (*) 19 - 32 mEq/L   Glucose, Bld 103 (*) 70 - 99 mg/dL   BUN 17  6 - 23 mg/dL   Creatinine, Ser 0.91  0.50 - 1.10 mg/dL   Calcium 8.8  8.4 - 10.5 mg/dL   GFR calc non Af Amer 52 (*) >90 mL/min   GFR calc Af Amer 61 (*) >90 mL/min   Anion gap 9  5 - 15    Studies/Results: No results found.  Medications: Reviewed   @PROBHOSP @  1,  Acute on chronic diastolic CHF. Remains volume overloaded; continue IV lasix and follow renal function. Would most likely diurese over weekend.  2.  Afib  Continue amio  200 qd; continue plavix  3.  Probable CAD  Asymptomatic.    LOS: 6 days   Laura Zuniga 10/09/2013, 8:30  AM

## 2013-10-09 NOTE — Progress Notes (Signed)
Subjective: Doing better No SOB  Objective: Vital signs in last 24 hours: Temp:  [97.9 F (36.6 C)-98.4 F (36.9 C)] 98.4 F (36.9 C) (07/03 0543) Pulse Rate:  [69-71] 70 (07/03 0543) Resp:  [18] 18 (07/03 0543) BP: (136-149)/(49-68) 146/50 mmHg (07/03 0543) SpO2:  [93 %-96 %] 96 % (07/03 0543) Weight:  [64.3 kg (141 lb 12.1 oz)] 64.3 kg (141 lb 12.1 oz) (07/03 0543) Weight change: -1.018 kg (-2 lb 3.9 oz) Last BM Date: 10/08/13  Intake/Output from previous day: 07/02 0701 - 07/03 0700 In: 1160 [P.O.:1160] Out: 1253 [Urine:1251; Stool:2] Intake/Output this shift:    General appearance: alert Resp: clear to auscultation bilaterally Cardio: regular rate and rhythm Extremities: edema in the thigh area- better  Lab Results: No results found for this basename: WBC, HGB, HCT, PLT,  in the last 72 hours BMET  Recent Labs  10/08/13 0416 10/09/13 0004  NA 138 137  K 4.5 4.9  CL 95* 93*  CO2 36* 35*  GLUCOSE 92 103*  BUN 13 17  CREATININE 0.83 0.91  CALCIUM 8.8 8.8    Studies/Results: No results found.  Medications: I have reviewed the patient's current medications.  Assessment/Plan: Principal Problem:  Acute on chronic diastolic CHF (congestive heart failure), NYHA class 4 -per cardiology- med change  Continue lasix IV for now- per card-  Watch renal function closely- to avoid over diuresis. Renal function stable; K - ok  Active Problems:  DEMENTIA, , W/O BEHAVIORAL DISTURBANCE  DEPRESSION  HYPERTENSION  CAD  Episode of urinary retention- resolved- off Flomax now.  Recent colitis- resolved- off ABX  COPD (chronic obstructive pulmonary disease) - some component of COPD (chronic bronchitis)- on - Neb prn  Check RM / ambulatory Pulse OX Atrial fibrillation -  In NSR Decondition: PT - will need SNF- rehab  SNF- rehab - when clinically stable- probable Monday.   LOS: 6 days   Sujay Grundman 10/09/2013, 8:52 AM

## 2013-10-09 NOTE — Progress Notes (Signed)
Physical Therapy Treatment Patient Details Name: Laura Zuniga MRN: [id number] DOB: Jun 28, 1917 Today's Date: October 15, 2013    History of Present Illness Patient is a 80 y/o female admitted 6/10-6/24 for pseudomembranous colitis/sigmoid diverticulosis,  Cad without Cath 2007, Grade 1 CKD? From [place] facility after just being discharged.    PT Comments    Patient making good progress with mobility/gait.  Able to tolerate ambulation on room air.  Follow Up Recommendations  SNF     Equipment Recommendations  None recommended by PT    Recommendations for Other Services OT consult     Precautions / Restrictions Precautions Precautions: Fall Restrictions Weight Bearing Restrictions: No    Mobility  Bed Mobility                  Transfers Overall transfer level: Needs assistance Equipment used: Rolling walker (2 wheeled) Transfers: Sit to/from Stand Sit to Stand: Min guard         General transfer comment: Verbal cues to scoot to edge of chair and for hand placement.  Assist for safety.  Ambulation/Gait Ambulation/Gait assistance: Min guard Ambulation Distance (Feet): 200 Feet Assistive device: Rolling walker (2 wheeled) Gait Pattern/deviations: Step-through pattern;Decreased stride length;Trunk flexed;Wide base of support Gait velocity: decreased Gait velocity interpretation: Below normal speed for age/gender General Gait Details: Verbal cues and assist for safety with turns with RW.   Stairs            Wheelchair Mobility    Modified Rankin (Stroke Patients Only)       Balance                                    Cognition Arousal/Alertness: Awake/alert Behavior During Therapy: WFL for tasks assessed/performed Overall Cognitive Status: History of cognitive impairments - at baseline                      Exercises General Exercises - Upper Extremity Shoulder Flexion: AROM;Both;10 reps;Seated Shoulder  Horizontal ABduction: AROM;Both;10 reps;Seated Shoulder Horizontal ADduction: AROM;Both;10 reps;Seated Elbow Flexion: AROM;Both;10 reps;Seated General Exercises - Lower Extremity Ankle Circles/Pumps: AROM;Strengthening;Both;10 reps;Seated Long Arc Quad: AROM;Seated;Both;10 reps Heel Slides: AROM;Both;10 reps;Seated Hip ABduction/ADduction: AROM;Both;10 reps;Seated Straight Leg Raises: AROM;Both;10 reps;Seated Hip Flexion/Marching: AROM;Both;10 reps;Seated    General Comments        Pertinent Vitals/Pain O2 sat at 93% with ambulation on room air.    Home Living                      Prior Function            PT Goals (current goals can now be found in the care plan section) Progress towards PT goals: Progressing toward goals    Frequency  Min 2X/week    PT Plan Current plan remains appropriate;Frequency needs to be updated    Co-evaluation             End of Session Equipment Utilized During Treatment: Gait belt Activity Tolerance: Patient tolerated treatment well Patient left: in chair;with call bell/phone within reach;with family/visitor present     Time: 6294-7654 PT Time Calculation (min): 25 min  Charges:  $Gait Training: 8-22 mins $Therapeutic Exercise: 8-22 mins                    G Codes:      Despina Pole 2013-10-15, 1:49 PM Manuela Schwartz  Dorcas Carrow, PT, MBA Acute Rehab Services Pager [phone]

## 2013-10-10 NOTE — Progress Notes (Signed)
Family at the beside, questions answered. Pt eating dinner sitting up in chair no complaints.

## 2013-10-10 NOTE — Progress Notes (Signed)
Subjective: Shortness of breath has improved and lower extremity edema is better.  Patient is negative for approximately 2 liters since yesterday.  Cardiology is seeing and recommends and continued IV Lasix over the weekend  Objective: Weight change: -2 lb 13.9 oz (-1.3 kg)  Intake/Output Summary (Last 24 hours) at 10/10/13 0918 Last data filed at 10/10/13 0518  Gross per 24 hour  Intake    460 ml  Output   2425 ml  Net  -1965 ml   Filed Vitals:   10/09/13 1455 10/09/13 1835 10/09/13 2045 10/10/13 0510  BP: 149/61 162/68 154/61 161/75  Pulse: 63 70 71 72  Temp: 98 F (36.7 C) 97.9 F (36.6 C) 98.2 F (36.8 C) 97.3 F (36.3 C)  TempSrc: Oral Oral Oral Oral  Resp: _0 Height:      Weight:    138 lb 14.2 oz (63 kg)  SpO2: 95% 95% 94% 92%    General Appearance: Alert, cooperative, no distress, appears stated age Lungs: Clear to auscultation bilaterally, respirations unlabored Heart: Regular rate and rhythm, S1 and S2 normal, no murmur, rub or gallop Abdomen: Soft, non-tender, bowel sounds active all four quadrants, no masses, no organomegaly Extremities: 1+ edema, bilaterally in lower extremities Neuro: Alert, nonfocal  Lab Results: Results for orders placed during the hospital encounter of 10/03/13 (from the past 48 hour(s))  PRO B NATRIURETIC PEPTIDE     Status: Abnormal   Collection Time    10/09/13 12:04 AM      Result Value Ref Range   Pro B Natriuretic peptide (BNP) 2628.0 (*) 0 - 450 pg/mL  BASIC METABOLIC PANEL     Status: Abnormal   Collection Time    10/09/13 12:04 AM      Result Value Ref Range   Sodium 137  137 - 147 mEq/L   Potassium 4.9  3.7 - 5.3 mEq/L   Chloride 93 (*) 96 - 112 mEq/L   CO2 35 (*) 19 - 32 mEq/L   Glucose, Bld 103 (*) 70 - 99 mg/dL   BUN 17  6 - 23 mg/dL   Creatinine, Ser 0.91  0.50 - 1.10 mg/dL   Calcium 8.8  8.4 - 10.5 mg/dL   GFR calc non Af Amer 52 (*) >90 mL/min   GFR calc Af Amer 61 (*) >90 mL/min   Comment: (NOTE)    The eGFR has been calculated using the CKD EPI equation.     This calculation has not been validated in all clinical situations.     eGFR's persistently <90 mL/min signify possible Chronic Kidney     Disease.   Anion gap 9  5 - 15    Studies/Results: No results found. Medications: Scheduled Meds: . amiodarone  200 mg Oral Daily  . clopidogrel  75 mg Oral Q breakfast  . escitalopram  5 mg Oral Daily  . furosemide  80 mg Intravenous BID  . heparin  5,000 Units Subcutaneous 3 times per day  . hydrALAZINE  10 mg Oral 3 times per day  . isosorbide mononitrate  15 mg Oral Daily  . potassium chloride  20 mEq Oral Daily  . sodium chloride  3 mL Intravenous Q12H   Continuous Infusions:  PRN Meds:.acetaminophen, ipratropium-albuterol, meclizine  Assessment/Plan: Principal Problem:  Acute on chronic diastolic CHF (congestive heart failure), NYHA class 4 - per cardiology  Continue lasix IV for now- per card- Watch renal function closely- to avoid over diuresis.  Creatinine stable today Renal  function stable; K - ok  Active Problems:  DEMENTIA, , W/O BEHAVIORAL DISTURBANCE  DEPRESSION  HYPERTENSION  CAD  Episode of urinary retention- resolved- off Flomax now.  Recent colitis- resolved- off ABX  COPD (chronic obstructive pulmonary disease) - some component of COPD (chronic bronchitis) - on - Neb prn  Atrial fibrillation - In NSR  Decondition: PT - will need SNF- rehab  SNF- rehab - when clinically stable- probable Monday.  LOS: 6 days        LOS: 7 days   Henrine Screws, MD 10/10/2013, 9:18 AM

## 2013-10-10 NOTE — Progress Notes (Signed)
Patient ID: ANALISSA BAYLESS, female   DOB: 01-13-1918, 80 y.o.   MRN: [id number] Subjective: Denies dyspnea or chest pain Objective: Filed Vitals:   10/09/13 1455 10/09/13 1835 10/09/13 2045 10/10/13 0510  BP: 149/61 162/68 154/61 161/75  Pulse: 63 70 71 72  Temp: 98 F (36.7 C) 97.9 F (36.6 C) 98.2 F (36.8 C) 97.3 F (36.3 C)  TempSrc: Oral Oral Oral Oral  Resp: 18 18 18 20   Height:      Weight:    138 lb 14.2 oz (63 kg)  SpO2: 95% 95% 94% 92%   Weight change: -2 lb 13.9 oz (-1.3 kg)  Intake/Output Summary (Last 24 hours) at 10/10/13 2263 Last data filed at 10/10/13 0518  Gross per 24 hour  Intake    460 ml  Output   2976 ml  Net  -2516 ml     General: Alert, awake, oriented x3, in no acute distress Neck:  Supple Heart: RRR Lungs: Decreased BS at bases  Abd: soft, not tender Exemities:  1+edema.   Neuro: Grossly intact, nonfocal.  TELE - SR    Lab Results: No results found for this or any previous visit (from the past 24 hour(s)).  Studies/Results: No results found.  Medications: Reviewed  A/P  1,  Acute on chronic diastolic CHF. Remains volume overloaded but better; continue IV lasix and follow renal function. Would continue to diurese over weekend. Will repeat BMP.  2.  Afib  Continue amio  200 qd; continue plavix  3.  Probable CAD  Asymptomatic.    LOS: 7 days   Cristopher Peru 10/10/2013, 8:07 AM

## 2013-10-10 NOTE — Progress Notes (Signed)
Pt sitting up in the chair comfortably, doing arm exercises. No concerns at this time, call bell, bedside table and phone within reach. Spoke with daughter updates given. Pt requesting to sit up as long as possible.

## 2013-10-11 ENCOUNTER — Inpatient Hospital Stay (HOSPITAL_COMMUNITY): Payer: Medicare HMO

## 2013-10-11 LAB — BASIC METABOLIC PANEL
Anion gap: 13 (ref 5–15)
BUN: 11 mg/dL (ref 6–23)
CALCIUM: 9.5 mg/dL (ref 8.4–10.5)
CO2: 30 mEq/L (ref 19–32)
CREATININE: 0.79 mg/dL (ref 0.50–1.10)
Chloride: 92 mEq/L — ABNORMAL LOW (ref 96–112)
GFR calc Af Amer: 80 mL/min — ABNORMAL LOW (ref >90)
GFR, EST NON AFRICAN AMERICAN: 69 mL/min — AB (ref >90)
Glucose, Bld: 95 mg/dL (ref 70–99)
Potassium: 3.5 mEq/L — ABNORMAL LOW (ref 3.7–5.3)
SODIUM: 135 meq/L — AB (ref 137–147)

## 2013-10-11 LAB — PRO B NATRIURETIC PEPTIDE: Pro B Natriuretic peptide (BNP): 2373 pg/mL — ABNORMAL HIGH (ref 0–450)

## 2013-10-11 MED ORDER — FUROSEMIDE 40 MG PO TABS
40.0000 mg | ORAL_TABLET | Freq: Two times a day (BID) | ORAL | Status: DC
  Filled 2013-10-11 (×3): qty 1

## 2013-10-11 MED ORDER — FUROSEMIDE 80 MG PO TABS
80.0000 mg | ORAL_TABLET | Freq: Every day | ORAL | Status: DC
  Administered 2013-10-11 – 2013-10-12 (×2): 80 mg via ORAL
  Filled 2013-10-11 (×2): qty 1

## 2013-10-11 MED ORDER — HYDRALAZINE HCL 10 MG PO TABS: 10.0000 mg | ORAL_TABLET | Freq: Three times a day (TID) | ORAL | Status: DC

## 2013-10-11 MED ORDER — POTASSIUM CHLORIDE CRYS ER 20 MEQ PO TBCR
20.0000 meq | EXTENDED_RELEASE_TABLET | Freq: Two times a day (BID) | ORAL | Status: DC
  Administered 2013-10-11 – 2013-10-12 (×2): 20 meq via ORAL
  Filled 2013-10-11 (×4): qty 1

## 2013-10-11 MED ORDER — FUROSEMIDE 40 MG PO TABS
ORAL_TABLET | ORAL | Status: DC
Start: 2013-10-11 — End: 2014-03-10

## 2013-10-11 MED ORDER — IPRATROPIUM-ALBUTEROL 0.5-2.5 (3) MG/3ML IN SOLN: 3.0000 mL | RESPIRATORY_TRACT | Status: DC | PRN

## 2013-10-11 MED ORDER — ISOSORBIDE MONONITRATE 15 MG HALF TABLET: 15.0000 mg | ORAL_TABLET | Freq: Every day | ORAL | Status: DC

## 2013-10-11 MED ORDER — FUROSEMIDE 40 MG PO TABS
40.0000 mg | ORAL_TABLET | Freq: Every day | ORAL | Status: DC
  Administered 2013-10-11: 40 mg via ORAL
  Filled 2013-10-11 (×2): qty 1

## 2013-10-11 MED ORDER — POTASSIUM CHLORIDE CRYS ER 20 MEQ PO TBCR: 20.0000 meq | EXTENDED_RELEASE_TABLET | Freq: Two times a day (BID) | ORAL | Status: DC

## 2013-10-11 NOTE — Progress Notes (Addendum)
Subjective: Patient breathing much better overall.  She is negative another 500-600 cc over the past 24 hours.  Seen by Dr. Crissie Sickles this morning he recommends transitioning to oral Lasix 80 milligrams the morning and 40 milligrams in the afternoon and hopefully she can be discharged tomorrow.  Objective: Weight change: -2 lb 11 oz (-1.22 kg)  Intake/Output Summary (Last 24 hours) at 10/11/13 1000 Last data filed at 10/11/13 0900  Gross per 24 hour  Intake    840 ml  Output   1301 ml  Net   -461 ml   Filed Vitals:   10/10/13 1300 10/10/13 1932 10/11/13 0323 10/11/13 0957  BP: 118/63 160/72 149/71 167/70  Pulse: 58 74 62 58  Temp: 97.4 F (36.3 C) 97.6 F (36.4 C) 98 F (36.7 C) 97.3 F (36.3 C)  TempSrc: Oral Oral Oral Oral  Resp: _0 Height:      Weight:   136 lb 3.2 oz (61.78 kg)   SpO2: 94% 94% 94% 96%    General Appearance: Alert, cooperative, no distress, appears stated age Lungs: Clear to auscultation bilaterally, respirations unlabored Heart: Regular rate and rhythm, S1 and S2 normal, no murmur, rub or gallop Abdomen: Soft, non-tender, bowel sounds active all four quadrants, no masses, no organomegaly Extremities: Trace to 1+ edema in the feet and legs Neuro: Nonfocal, alert  Lab Results: Results for orders placed during the hospital encounter of 10/03/13 (from the past 48 hour(s))  PRO B NATRIURETIC PEPTIDE     Status: Abnormal   Collection Time    10/11/13  3:35 AM      Result Value Ref Range   Pro B Natriuretic peptide (BNP) 2373.0 (*) 0 - 450 pg/mL  BASIC METABOLIC PANEL     Status: Abnormal   Collection Time    10/11/13  3:35 AM      Result Value Ref Range   Sodium 135 (*) 137 - 147 mEq/L   Potassium 3.5 (*) 3.7 - 5.3 mEq/L   Chloride 92 (*) 96 - 112 mEq/L   CO2 30  19 - 32 mEq/L   Glucose, Bld 95  70 - 99 mg/dL   BUN 11  6 - 23 mg/dL   Creatinine, Ser 0.79  0.50 - 1.10 mg/dL   Calcium 9.5  8.4 - 10.5 mg/dL   GFR calc non Af Amer 69 (*)  >90 mL/min   GFR calc Af Amer 80 (*) >90 mL/min   Comment: (NOTE)     The eGFR has been calculated using the CKD EPI equation.     This calculation has not been validated in all clinical situations.     eGFR's persistently <90 mL/min signify possible Chronic Kidney     Disease.   Anion gap 13  5 - 15    Studies/Results: No results found. Medications: Scheduled Meds: . amiodarone  200 mg Oral Daily  . clopidogrel  75 mg Oral Q breakfast  . escitalopram  5 mg Oral Daily  . furosemide  40 mg Oral Q2000  . furosemide  80 mg Oral Daily  . heparin  5,000 Units Subcutaneous 3 times per day  . hydrALAZINE  10 mg Oral 3 times per day  . isosorbide mononitrate  15 mg Oral Daily  . potassium chloride  20 mEq Oral Daily  . sodium chloride  3 mL Intravenous Q12H   Continuous Infusions:  PRN Meds:.acetaminophen, ipratropium-albuterol, meclizine  Assessment/Plan:  Principal Problem:  Acute on chronic diastolic  CHF (congestive heart failure), NYHA class 4 - per cardiology  Changed to by mouth Lasix today and check basic metabolic profile in a.m.  Active Problems:  DEMENTIA, , W/O BEHAVIORAL DISTURBANCE - stable DEPRESSION - stable HYPERTENSION - upper normal control CAD - no chest pain  Episode of urinary retention- resolved Recent colitis- resolved- off ABX  COPD (chronic obstructive pulmonary disease) - some component of COPD (chronic bronchitis) - on - Neb prn  Atrial fibrillation - In NSR  Decondition: PT - will need SNF- rehab  SNF- rehab - when clinically stable- probable Monday - ? back to [facility]?   LOS: 8 days   Henrine Screws, MD 10/11/2013, 10:00 AM

## 2013-10-11 NOTE — Progress Notes (Addendum)
Patient ID: Laura Zuniga, female   DOB: 1918-03-16, 80 y.o.   MRN: [id number] Subjective: Denies dyspnea or chest pain, appears back to baseline Objective: Filed Vitals:   10/10/13 0510 10/10/13 1300 10/10/13 1932 10/11/13 0323  BP: 161/75 118/63 160/72 149/71  Pulse: 72 58 74 62  Temp: 97.3 F (36.3 C) 97.4 F (36.3 C) 97.6 F (36.4 C) 98 F (36.7 C)  TempSrc: Oral Oral Oral Oral  Resp: 20 20 18 18   Height:      Weight: 138 lb 14.2 oz (63 kg)   136 lb 3.2 oz (61.78 kg)  SpO2: 92% 94% 94% 94%   Weight change: -2 lb 11 oz (-1.22 kg)  Intake/Output Summary (Last 24 hours) at 10/11/13 0817 Last data filed at 10/10/13 2223  Gross per 24 hour  Intake    600 ml  Output   1301 ml  Net   -701 ml     General: Alert, awake, oriented x3, in no acute distress Neck:  Supple Heart: RRR Lungs: Decreased BS at bases  Abd: soft, not tender Exemities:  1+edema.   Neuro: Grossly intact, nonfocal.  TELE - SR    Lab Results: Results for orders placed during the hospital encounter of 10/03/13 (from the past 24 hour(s))  PRO B NATRIURETIC PEPTIDE     Status: Abnormal   Collection Time    10/11/13  3:35 AM      Result Value Ref Range   Pro B Natriuretic peptide (BNP) 2373.0 (*) 0 - 450 pg/mL  BASIC METABOLIC PANEL     Status: Abnormal   Collection Time    10/11/13  3:35 AM      Result Value Ref Range   Sodium 135 (*) 137 - 147 mEq/L   Potassium 3.5 (*) 3.7 - 5.3 mEq/L   Chloride 92 (*) 96 - 112 mEq/L   CO2 30  19 - 32 mEq/L   Glucose, Bld 95  70 - 99 mg/dL   BUN 11  6 - 23 mg/dL   Creatinine, Ser 0.79  0.50 - 1.10 mg/dL   Calcium 9.5  8.4 - 10.5 mg/dL   GFR calc non Af Amer 69 (*) >90 mL/min   GFR calc Af Amer 80 (*) >90 mL/min   Anion gap 13  5 - 15    Studies/Results: No results found.  Medications: Reviewed  A/P  1,  Acute on chronic diastolic CHF. Weight down another 2 lbs and appears about at baseline euvolemia; switch to po lasix and follow renal function. Would  continue to diurese with oral lasix and hopefully she can be discharged tomorrow. Will repeat BMP.  2.  Afib  Continue amio  200 qd; continue plavix  3.  Probable CAD.  Asymptomatic.    LOS: 8 days   Cristopher Peru 10/11/2013, 8:17 AM  Addendum  Discussed the patient's care with her daughter. She was discharged previously on only 40 mg of lasix daily. She is chronically on 80 mg in the morning and 40 mg in the evening. Daughter is quite unhappy about her prior lasix dosing as the patient was back in the hospital within 3 days of discharge with volume overload.  Mikle Bosworth.D.

## 2013-10-11 NOTE — Discharge Summary (Signed)
Physician Discharge Summary  Patient ID: Laura Zuniga MRN: [id number] DOB/AGE: [birth year] 80 y.o.  Admit date: 10/03/2013 Discharge date: 10/12/2013  Admission Diagnoses:  Discharge Diagnoses:  Principal Problem:   Acute on chronic diastolic CHF (congestive heart failure), NYHA class 4 Active Problems:   DEMENTIA, CCE, W/O BEHAVIORAL DISTURBANCE   DEPRESSION   HYPERTENSION   CAD   COPD (chronic obstructive pulmonary disease)   Atrial fibrillation with RVR-normal sinus rhythm   CHF (congestive heart failure)   Acute heart failure Anemia Recent history of C. Difficile colitis Recent history of ATN resolved. Deconditioning and weakness  Discharged Condition: good  Hospital Course: 80 year old female admitted with acute on chronic diastolic heart failure, recent history of C. difficile colitis treated with antibiotics resolved also had complications of HTN because of hypotension that also resolved, history of recent A. fib with RVR; started on amiodarone on previous admission. Problem #1 acute on chronic diastolic heart failure patient was started on IV Lasix her BNP was elevated 3462, patient aggressively diuresis over one week with IV Lasix with good response; renal function monitored remained normal; last BNP 2300 clinically stayed euvolemic Continue TED HOSE stocking during Day. Potassium   Om 10/12/13- was 5.2-  Potassium was changed form 20 meq bid to 10 meq daily. F/u BMET at [place] on 10/15/13 Continue on oral Lasix 80 mg in the morning and 40 mg at noon, with potassium 10 meq a day. Patient was started back on her isosorbide. Problem #2: Atrial fibrillation: Patient was loaded with amiodarone on previous admission continue on oral amiodarone, patient remained in normal sinus rhythm, no anticoagulation because of her recent pancolitis and anemia. Continue on amiodarone Problem #3: Episode of bradycardia arrhythmia patient's Cardizem was discontinued. Problem #4:  Hypertension started on hydralazine. Blood pressure remained stable Problem #5: History of anemia continue on iron supplement Probable 6: Episode of urinary retention on previous admission when she had sepsis and colitis, that resolved, Flomax was discontinued; no evidence of any urinary retention. Problem #7: History of mild dementia stable Problem #8: COPD chronic bronchitis, DuoNeb nebulizers p.r.n.; oxygen p.r.n. Problem #9: Weakness and deconditioning continue physical therapy improving slowly. Problem #10: Anxiety and depression continue Lexapro. Problem#11: GERD continue with Protonix daily and Maalox prn SEE CURRENT MAR FOR CURRENT MEDICATION LIST; NOT IN D/C SUMMARY. Consults: cardiology  Significant Diagnostic Studies: labs: creatinine 0.79 potassium3.5, WBC 6.3 hemoglobin 9.6, microbiology: urine culture: negative and radiology: CXR: cardiomegaly and CHF  Treatments: cardiac meds: furosemide  Discharge Exam: Blood pressure 122/66, pulse 61, temperature 97.8 F (36.6 C), temperature source Oral, resp. rate 18, height 5\' 1"  (1.549 m), weight 61.78 kg (136 lb 3.2 oz), SpO2 96.00%. General appearance: alert Resp: clear to auscultation bilaterally Cardio: regular rate and rhythm  Disposition: 03-Skilled Nursing Facility  Discharge Instructions   Diet - low sodium heart healthy    Complete by:  As directed      Diet - low sodium heart healthy    Complete by:  As directed      Discharge instructions    Complete by:  As directed   Check renal function twice a week; for next 2 weeks- follow creatinine and potassium. Cardiology follow up with Dr. Irish Lack.- 1-2 weeks     Increase activity slowly    Complete by:  As directed      Increase activity slowly    Complete by:  As directed             Medication  List    STOP taking these medications       albuterol (2.5 MG/3ML) 0.083% nebulizer solution  Commonly known as:  PROVENTIL     diltiazem 180 MG 24 hr capsule   Commonly known as:  CARDIZEM CD     fidaxomicin 200 MG Tabs tablet  Commonly known as:  DIFICID     levalbuterol 0.63 MG/3ML nebulizer solution  Commonly known as:  XOPENEX     tamsulosin 0.4 MG Caps capsule  Commonly known as:  FLOMAX      TAKE these medications       acetaminophen 500 MG tablet  Commonly known as:  TYLENOL  Take 500-1,000 mg by mouth every 6 (six) hours as needed for moderate pain or headache.     alum & mag hydroxide-simeth 200-200-20 MG/5ML suspension  Commonly known as:  MAALOX/MYLANTA  Take 30 mLs by mouth every 6 (six) hours as needed for indigestion or heartburn (dyspepsia).     amiodarone 200 MG tablet  Commonly known as:  PACERONE  Take 1 tablet (200 mg total) by mouth daily.     calcium-vitamin D 500-200 MG-UNIT per tablet  Commonly known as:  OSCAL WITH D  Take 1 tablet by mouth daily.     clopidogrel 75 MG tablet  Commonly known as:  PLAVIX  Take 1 tablet (75 mg total) by mouth daily.     Cranberry 250 MG Caps  Take 1 capsule by mouth daily.     cyanocobalamin 500 MCG tablet  Take 500 mcg by mouth daily.     escitalopram 10 MG tablet  Commonly known as:  LEXAPRO  Take 5 mg by mouth daily.     ferrous sulfate 325 (65 FE) MG tablet  Take 325 mg by mouth daily with breakfast.     furosemide 40 MG tablet  Commonly known as:  LASIX  2 tablet in am, and 1 tablet at noon     hydrALAZINE 10 MG tablet  Commonly known as:  APRESOLINE  Take 1 tablet (10 mg total) by mouth every 8 (eight) hours.     ipratropium-albuterol 0.5-2.5 (3) MG/3ML Soln  Commonly known as:  DUONEB  Take 3 mLs by nebulization every 4 (four) hours as needed.     isosorbide mononitrate 15 mg Tb24 24 hr tablet  Commonly known as:  IMDUR  Take 0.5 tablets (15 mg total) by mouth daily.     meclizine 25 MG tablet  Commonly known as:  ANTIVERT  Take 25 mg by mouth 2 (two) times daily as needed for dizziness.     multivitamin with minerals Tabs tablet  Take 1  tablet by mouth daily.     pantoprazole 40 MG tablet  Commonly known as:  PROTONIX  Take 40 mg by mouth daily.     potassium chloride SA 20 MEQ tablet  Commonly known as:  K-DUR,KLOR-CON  Take 1 tablet (20 mEq total) by mouth 2 (two) times daily.     pravastatin 40 MG tablet  Commonly known as:  PRAVACHOL  Take 40 mg by mouth daily.     vitamin C 500 MG tablet  Commonly known as:  ASCORBIC ACID  Take 500 mg by mouth daily.       TOTAL TIME FOR D/C PLANNING  45 MIN.  SignedWenda Low 10/12/2013, 8:00 AM

## 2013-10-12 LAB — BASIC METABOLIC PANEL
ANION GAP: 11 (ref 5–15)
BUN: 13 mg/dL (ref 6–23)
CALCIUM: 9.6 mg/dL (ref 8.4–10.5)
CO2: 30 mEq/L (ref 19–32)
Chloride: 96 mEq/L (ref 96–112)
Creatinine, Ser: 0.92 mg/dL (ref 0.50–1.10)
GFR calc non Af Amer: 52 mL/min — ABNORMAL LOW (ref >90)
GFR, EST AFRICAN AMERICAN: 60 mL/min — AB (ref >90)
Glucose, Bld: 108 mg/dL — ABNORMAL HIGH (ref 70–99)
Potassium: 5.2 mEq/L (ref 3.7–5.3)
Sodium: 137 mEq/L (ref 137–147)

## 2013-10-12 LAB — PRO B NATRIURETIC PEPTIDE: Pro B Natriuretic peptide (BNP): 2341 pg/mL — ABNORMAL HIGH (ref 0–450)

## 2013-10-12 MED ORDER — PANTOPRAZOLE SODIUM 40 MG PO TBEC
40.0000 mg | DELAYED_RELEASE_TABLET | Freq: Every day | ORAL | Status: DC
  Administered 2013-10-12: 40 mg via ORAL
  Filled 2013-10-12: qty 1

## 2013-10-12 MED ORDER — ALUM & MAG HYDROXIDE-SIMETH 200-200-20 MG/5ML PO SUSP
15.0000 mL | ORAL | Status: DC | PRN
  Administered 2013-10-12: 15 mL via ORAL
  Filled 2013-10-12: qty 30

## 2013-10-12 MED ORDER — POTASSIUM CHLORIDE CRYS ER 20 MEQ PO TBCR: 20.0000 meq | EXTENDED_RELEASE_TABLET | Freq: Once | ORAL | Status: DC

## 2013-10-12 MED ORDER — POTASSIUM CHLORIDE CRYS ER 10 MEQ PO TBCR
10.0000 meq | EXTENDED_RELEASE_TABLET | Freq: Once | ORAL | Status: DC
  Filled 2013-10-12: qty 1

## 2013-10-12 MED ORDER — POTASSIUM CHLORIDE CRYS ER 10 MEQ PO TBCR: 10.0000 meq | EXTENDED_RELEASE_TABLET | Freq: Once | ORAL | Status: DC

## 2013-10-12 NOTE — Progress Notes (Signed)
New CSW order received over the weekend to assist patient with SNF placement.  MD- please note: patient is from [facility] and bed remains available for patient.  Awaiting stability per MD.  Family notified Friday of probably d/c today.  Thanks.  Lorie Phenix. [place], [place]

## 2013-10-12 NOTE — Progress Notes (Signed)
Discharge instructions given to pt and daughter .verbalized understanding  1330 discharged to [facility] via [facility]

## 2013-10-12 NOTE — Progress Notes (Signed)
1227 Telephone report given to Learta Codding, Nurse from [facility]

## 2013-10-12 NOTE — Progress Notes (Signed)
Patient ID: Laura Zuniga, female   DOB: March 17, 1918, 81 y.o.   MRN: [id number]    Subjective: Denies dyspnea or chest pain, appears back to baseline Objective: Filed Vitals:   10/11/13 1440 10/11/13 2112 10/12/13 0510 10/12/13 1143  BP: 122/66 172/67 127/68 135/71  Pulse: 61 66 54 55  Temp: 97.8 F (36.6 C) 98 F (36.7 C) 98.4 F (36.9 C)   TempSrc: Oral Oral Oral   Resp: 18 19 18 16   Height:      Weight:   136 lb 3.2 oz (61.78 kg)   SpO2: 96% 96% 99% 95%   Weight change: 0 lb (0 kg)  Intake/Output Summary (Last 24 hours) at 10/12/13 1251 Last data filed at 10/12/13 0851  Gross per 24 hour  Intake    720 ml  Output   2300 ml  Net  -1580 ml   General: Alert, awake, oriented x3, in no acute distress Neck:  Supple Heart: RRR Lungs: Decreased BS at bases  Abd: soft, not tender Exemities:  1+edema.   Neuro: Grossly intact, nonfocal.  TELE - SR    Lab Results: Results for orders placed during the hospital encounter of 10/03/13 (from the past 24 hour(s))  BASIC METABOLIC PANEL     Status: Abnormal   Collection Time    10/12/13  2:36 AM      Result Value Ref Range   Sodium 137  137 - 147 mEq/L   Potassium 5.2  3.7 - 5.3 mEq/L   Chloride 96  96 - 112 mEq/L   CO2 30  19 - 32 mEq/L   Glucose, Bld 108 (*) 70 - 99 mg/dL   BUN 13  6 - 23 mg/dL   Creatinine, Ser 0.92  0.50 - 1.10 mg/dL   Calcium 9.6  8.4 - 10.5 mg/dL   GFR calc non Af Amer 52 (*) >90 mL/min   GFR calc Af Amer 60 (*) >90 mL/min   Anion gap 11  5 - 15  PRO B NATRIURETIC PEPTIDE     Status: Abnormal   Collection Time    10/12/13  2:36 AM      Result Value Ref Range   Pro B Natriuretic peptide (BNP) 2341.0 (*) 0 - 450 pg/mL    Studies/Results: Dg Chest 2 View  10/11/2013   CLINICAL DATA:  CHF  EXAM: CHEST  2 VIEW  COMPARISON:  10/05/2013  FINDINGS: No frank interstitial edema, improved. Small bilateral pleural effusions, left greater than right, improved. Mild left basilar opacity, likely atelectasis.   The heart is normal in size.  IMPRESSION: No frank interstitial edema, improved.  Small bilateral pleural effusions, left greater than right.   Electronically Signed   By: Julian Hy M.D.   On: 10/11/2013 15:22    Medications: Reviewed  A/P  1,  Acute on chronic diastolic CHF. Weight down another 3 lbs and appears about at baseline euvolemia; switch to po lasix, back to her original dosing with Lasix 40 mg PO BID. Crea is stable.  2.  Afib  Continue amio  200 qd; continue plavix  3.  Probable CAD.  Asymptomatic.  4. Disposition: awaiting placement in a SNF.    LOS: 9 days   Dorothy Spark 10/12/2013, 12:51 PM

## 2013-10-12 NOTE — Progress Notes (Signed)
The patient did not have any complaints overnight nor did she receive any PRN medications.  Her VS are stable and she slept most of the night.

## 2013-10-13 ENCOUNTER — Ambulatory Visit (INDEPENDENT_AMBULATORY_CARE_PROVIDER_SITE_OTHER): Payer: Medicare Other | Admitting: Ophthalmology

## 2013-10-13 NOTE — Progress Notes (Signed)
Clinical Social Worker facilitated patient discharge including contacting patient family and facility to confirm patient discharge plans.  Clinical information faxed to facility and family agreeable with plan. CSW arranged ambulance transport via [facility].  Notified patient and her 2 daughters - Event organiser and Hassan Rowan . RN to call report prior to discharge.   Clinical Social Worker will sign off for now as social work intervention is no longer needed. Please consult [place] again if new need arises.   Kendell Bane, [place]

## 2013-10-14 ENCOUNTER — Non-Acute Institutional Stay (SKILLED_NURSING_FACILITY): Payer: Commercial Managed Care - HMO | Admitting: Internal Medicine

## 2013-10-14 ENCOUNTER — Encounter: Payer: Self-pay | Admitting: *Deleted

## 2013-10-14 DIAGNOSIS — R197 Diarrhea, unspecified: Secondary | ICD-10-CM

## 2013-10-14 DIAGNOSIS — I509 Heart failure, unspecified: Secondary | ICD-10-CM

## 2013-10-14 DIAGNOSIS — I4891 Unspecified atrial fibrillation: Secondary | ICD-10-CM

## 2013-10-14 DIAGNOSIS — J449 Chronic obstructive pulmonary disease, unspecified: Secondary | ICD-10-CM

## 2013-10-14 DIAGNOSIS — I5032 Chronic diastolic (congestive) heart failure: Secondary | ICD-10-CM

## 2013-10-14 DIAGNOSIS — J4489 Other specified chronic obstructive pulmonary disease: Secondary | ICD-10-CM

## 2013-10-16 NOTE — Progress Notes (Signed)
HISTORY & PHYSICAL  DATE: 10/14/2013   FACILITY: [facility]  LEVEL OF CARE: SNF (31)  ALLERGIES:  Allergies  Allergen Reactions  . Penicillins     unknown  . Sulfonamide Derivatives     REACTION: Rash and swelling    CHIEF COMPLAINT:  Manage CHF, atrial fibrillation and COPD  HISTORY OF PRESENT ILLNESS: Patient is a 80 year old Caucasian female was hospitalized secondary to CHF exacerbation. After hospitalization she is admitted to this facility for short-term rehabilitation.  CHF:The patient does not relate significant weight changes, denies sob, DOE, orthopnea, PNDs, pedal edema, palpitations or chest pain.  CHF remains stable.  No complications form the medications being used.  ATRIAL FIBRILLATION: the patients atrial fibrillation remains stable.  The patient denies DOE, tachycardia, orthopnea, transient neurological sx, pedal edema, palpitations, & PNDs.  No complications noted from the medications currently being used.  COPD: the COPD remains stable.  Pt denies sob, cough, wheezing or declining exercise tolerance.  No complications from the medications presently being used.  PAST MEDICAL HISTORY :  Past Medical History  Diagnosis Date  . CHF (congestive heart failure)   . GERD (gastroesophageal reflux disease)   . Hypertension   . Diverticulitis   . UTI (urinary tract infection)   . Blockage of coronary artery of heart 2007    never had cardiac cath, CAD dx by positive ant. wall defect on Nuc. study.  no cath due to high risk.  . Macular degeneration     PAST SURGICAL HISTORY: Past Surgical History  Procedure Laterality Date  . Cholecystectomy    . Appendectomy    . Vaginal hysterectomy    . Eye surgery    . Esophagogastroduodenoscopy N/A 09/22/2013    Procedure: ESOPHAGOGASTRODUODENOSCOPY (EGD);  Surgeon: Lear Ng, MD;  Location: [facility] ENDOSCOPY;  Service: Endoscopy;  Laterality: N/A;  . Flexible sigmoidoscopy N/A  09/22/2013    Procedure: FLEXIBLE SIGMOIDOSCOPY;  Surgeon: Lear Ng, MD;  Location: [place];  Service: Endoscopy;  Laterality: N/A;  . Flexible sigmoidoscopy N/A 09/22/2013    Procedure: FLEXIBLE SIGMOIDOSCOPY;  Surgeon: Lear Ng, MD;  Location: [place];  Service: Endoscopy;  Laterality: N/A;  unprepped/ egd first  . Esophagogastroduodenoscopy N/A 09/22/2013    Procedure: ESOPHAGOGASTRODUODENOSCOPY (EGD);  Surgeon: Lear Ng, MD;  Location[facility] ENDOSCOPY;  Service: Endoscopy;  Laterality: N/A;    SOCIAL HISTORY:  reports that she has never smoked. She does not have any smokeless tobacco history on file. She reports that she does not drink alcohol or use illicit drugs.  FAMILY HISTORY:  Family History  Problem Relation Age of Onset  . Heart attack Father 69  . Heart attack Sister   . Cancer Brother   . Cancer Brother   . Cirrhosis Brother   . Heart disease Brother   . Cancer Brother   . COPD Brother   . COPD Sister   . Cancer Sister   . Cancer Sister     CURRENT MEDICATIONS: Reviewed per MAR/see medication list  REVIEW OF SYSTEMS:  GI: Complains of new onset diarrhea, See HPI otherwise 14 point ROS is negative.  PHYSICAL EXAMINATION  VS:  See VS section  GENERAL: no acute distress, normal body habitus EYES: conjunctivae normal, sclerae normal, normal eye lids MOUTH/THROAT: lips without lesions,no lesions in the mouth,tongue is without lesions,uvula elevates in midline NECK: supple, trachea midline, no neck masses, no thyroid tenderness, no thyromegaly LYMPHATICS: no LAN  in the neck, no supraclavicular LAN RESPIRATORY: breathing is even & unlabored, BS CTAB CARDIAC: RRR, no murmur,no extra heart sounds, +1 bilateral lower extremity edema GI:  ABDOMEN: abdomen soft, normal BS, no masses, no tenderness  LIVER/SPLEEN: no hepatomegaly, no splenomegaly MUSCULOSKELETAL: HEAD: normal to inspection  EXTREMITIES: LEFT UPPER EXTREMITY: full  range of motion, normal strength & tone RIGHT UPPER EXTREMITY:  full range of motion, normal strength & tone LEFT LOWER EXTREMITY:  Moderate range of motion, normal strength & tone RIGHT LOWER EXTREMITY:  Moderate range of motion, normal strength & tone PSYCHIATRIC: the patient is alert & oriented to person, affect & behavior appropriate  LABS/RADIOLOGY:  Labs reviewed: Basic Metabolic Panel:  Recent Labs  09/16/13 1858  09/21/13 0611 09/22/13 0640 09/23/13 0510  09/27/13 1730 09/28/13 0600  10/09/13 0004 10/11/13 0335 10/12/13 0236  NA  --   < > 131* 133* 137  < > 136* 135*  < > 137 135* 137  K  --   < > 3.6* 3.1* 3.4*  < > 3.3* 3.9  < > 4.9 3.5* 5.2  CL  --   < > 96 100 104  < > 96 94*  < > 93* 92* 96  CO2  --   < > 18* 18* 21  < > 30 33*  < > 35* 30 30  GLUCOSE  --   < > 87 91 84  < > 102* 108*  < > 103* 95 108*  BUN  --   < > 35* 31* 23  < > 6 5*  < > 17 11 13   CREATININE  --   < > 1.98* 1.58* 1.09  < > 0.65 0.69  < > 0.91 0.79 0.92  CALCIUM  --   < > 8.0* 8.1* 8.0*  < > 7.8* 7.9*  < > 8.8 9.5 9.6  MG 1.6  --   --   --   --   --  1.3* 1.8  --   --   --   --   PHOS 2.7  --  3.8 3.6 2.7  --   --   --   --   --   --   --   < > = values in this interval not displayed. Liver Function Tests:  Recent Labs  09/27/13 1730 09/29/13 1755 10/04/13 0410  AST 36 34 36  ALT 21 21 29   ALKPHOS 56 63 58  BILITOT 0.3 0.4 0.6  PROT 5.6* 5.8* 6.1  ALBUMIN 2.4* 2.5* 2.5*    Recent Labs  09/16/13 1304  LIPASE 13   CBC:  Recent Labs  09/16/13 1304  09/27/13 1730  10/03/13 1137 10/04/13 0410 10/04/13 1208  WBC 13.3*  < > 8.6  < > 8.0 5.7 6.3  NEUTROABS 10.4*  --  7.3  --  5.2  --   --   HGB 10.5*  < > 10.7*  < > 10.2* 9.3* 9.6*  HCT 31.0*  < > 32.0*  < > 31.2* 28.8* 29.9*  MCV 95.4  < > 95.8  < > 100.3* 99.0 99.3  PLT 197  < > 274  < > 245 234 240  < > = values in this interval not displayed.  Cardiac Enzymes:  Recent Labs  09/27/13 1730 09/28/13 0200  10/03/13 1137  TROPONINI <0.30 <0.30 <0.30    Transthoracic Echocardiography  Patient:    Joane, Postel MR #:       [id number] Study Date:  09/28/2013 Gender:     F Age:        60 Height:     154.9 cm Weight:     72.6 kg BSA:        1.79 m^2 Pt. Status: Room:       2H17C   ADMITTING    Wenda Low [id number]  YDXAJOINO    [facility], [place] [address] [id number]  [place], [place]  SONOGRAPHER  Tresa Res, RDCS  PERFORMING   [facility], Inpatient  cc:  ------------------------------------------------------------------- LV EF: 55% -   60%  ------------------------------------------------------------------- Indications:      Atrial fibrillation - 427.31.  CHF - 428.0.   ------------------------------------------------------------------- Study Conclusions  - Left ventricle: Wall thickness was increased in a pattern of mild   LVH. Systolic function was normal. The estimated ejection   fraction was in the range of 55% to 60%. - Aortic valve: There was mild regurgitation. - Mitral valve: There was mild regurgitation. - Left atrium: The atrium was moderately dilated. - Right atrium: The atrium was mildly dilated. - Pulmonary arteries: PA peak pressure: 52 mm Hg (S).  Transthoracic echocardiography.  M-mode, complete 2D, spectral Doppler, and color Doppler.  Birthdate:  Patient birthdate: [birth year].  Age:  Patient is 80 yr old.  Sex:  Gender: female. Height:  Height: 154.9 cm. Height: 61 in.  Weight:  Weight: 72.6 kg. Weight: 159.7 lb.  Body mass index:  BMI: 30.2 kg/m^2.  Body surface area:    BSA: 1.79 m^2.  Blood pressure:     135/42 Patient status:  Inpatient.  Study date:  Study date: 09/28/2013. Study time: 12:15 PM.  Location:  ICU/CCU  -------------------------------------------------------------------  ------------------------------------------------------------------- Left ventricle:   Wall thickness was increased in a pattern of  mild LVH.   Systolic function was normal. The estimated ejection fraction was in the range of 55% to 60%.  ------------------------------------------------------------------- Aortic valve:  AV is moderately thickened and calcified with very mild restricted motion.  Doppler:  There was mild regurgitation.   ------------------------------------------------------------------- Mitral valve:  MR directed posterior into LA Appearsmderately severe to severe .  Mildly thickened leaflets .  Doppler:  There was mild regurgitation.    Peak gradient (D): 5 mm Hg.  ------------------------------------------------------------------- Left atrium:  The atrium was moderately dilated.  ------------------------------------------------------------------- Right ventricle:  The cavity size was normal. Wall thickness was normal. Systolic function was normal.  ------------------------------------------------------------------- Tricuspid valve:   Structurally normal valve.   Leaflet separation was normal.  Doppler:  Transvalvular velocity was within the normal range. There was mild regurgitation.  ------------------------------------------------------------------- Right atrium:  The atrium was mildly dilated.  ------------------------------------------------------------------- Pericardium:  There was no pericardial effusion.  ------------------------------------------------------------------- Systemic veins: Inferior vena cava: The vessel was dilated. The respirophasic diameter changes were blunted (< 50%), consistent with elevated central venous pressure.  ------------------------------------------------------------------- Prepared and Electronically Authenticated by  Dorris Carnes, M.D. 2015-06-22T17:24:40  ------------------------------------------------------------------- Measurements   Left ventricle                              Value        Reference  LV ID, ED, PLAX chordal             (L)      39.5  mm     43 - 52  LV ID, ES, PLAX chordal             (  N)     27.9  mm     23 - 38  LV fx shortening, PLAX chordal      (N)     29    %      >=29  LV PW thickness, ED                         11.1  mm     ---------  IVS/LV PW ratio, ED                 (N)     1.2          <=1.3  LV e&', lateral                              10.5  cm/s   ---------  LV E/e&', lateral                            10.76        ---------  LV e&', medial                               8.44  cm/s   ---------  LV E/e&', medial                             13.39        ---------  LV e&', average                              9.47  cm/s   ---------  LV E/e&', average                            11.93        ---------    Ventricular septum                          Value        Reference  IVS thickness, ED                           13.3  mm     ---------    LVOT                                        Value        Reference  LVOT ID, S                                  18    mm     ---------  LVOT area                                   2.54  cm^2   ---------    Aortic valve  Value        Reference  Aortic regurg pressure half-time            208   ms     ---------    Aorta                                       Value        Reference  Aortic root ID, ED                          25    mm     ---------    Left atrium                                 Value        Reference  LA ID, A-P, ES                              38    mm     ---------  LA ID/bsa, A-P                      (N)     2.12  cm/m^2 <=2.2    Mitral valve                                Value        Reference  Mitral E-wave peak velocity                 113   cm/s   ---------  Mitral A-wave peak velocity                 43.9  cm/s   ---------  Mitral deceleration time            (N)     165   ms     150 - 230  Mitral peak gradient, D                     5     mm Hg  ---------  Mitral E/A ratio, peak                      2.6           ---------  Mitral maximal regurg velocity,             506   cm/s   ---------  PISA  Mitral regurg VTI, PISA                     191   cm     ---------  Mitral ERO, PISA                            0.1   cm^2   ---------  Mitral regurg volume, PISA                  19    ml     ---------    Pulmonary arteries  Value        Reference  PA pressure, S, DP                  (H)     52    mm Hg  <=30    Tricuspid valve                             Value        Reference  Tricuspid regurg peak velocity              305   cm/s   ---------  Tricuspid peak RV-RA gradient               37    mm Hg  ---------  Tricuspid maximal regurg velocity,          305   cm/s   ---------  PISA    Systemic veins                              Value        Reference  Estimated CVP                               15    mm Hg  ---------    Right ventricle                             Value        Reference  RV pressure, S, DP                  (H)     52    mm Hg  <=30  CHEST  2 VIEW   COMPARISON:  10/05/2013   FINDINGS: No frank interstitial edema, improved. Small bilateral pleural effusions, left greater than right, improved. Mild left basilar opacity, likely atelectasis.   The heart is normal in size.   IMPRESSION: No frank interstitial edema, improved.   Small bilateral pleural effusions, left greater than right.    ASSESSMENT/PLAN:  CHF-compensated Atrial fibrillation-continue amiodarone COPD-compensated Diarrhea-new problem. Due to recent C. diff infection, recheck stool for C. difficile toxin. Hypertension-well-controlled Macrocytosis-check RBC folate and vitamin B12 level Anemia-recheck Mild dementia-stable CAD-stable  I have reviewed patient's medical records received at admission/from hospitalization.  CPT CODE: 40981  Jacori Mulrooney Y Noble Cicalese, [place] [phone]

## 2013-10-21 ENCOUNTER — Encounter: Payer: Self-pay | Admitting: Adult Health

## 2013-10-21 ENCOUNTER — Non-Acute Institutional Stay (SKILLED_NURSING_FACILITY): Payer: Commercial Managed Care - HMO | Admitting: Adult Health

## 2013-10-21 DIAGNOSIS — A0472 Enterocolitis due to Clostridium difficile, not specified as recurrent: Secondary | ICD-10-CM

## 2013-10-21 DIAGNOSIS — E871 Hypo-osmolality and hyponatremia: Secondary | ICD-10-CM

## 2013-10-21 NOTE — Progress Notes (Signed)
This encounter was created in error - please disregard.

## 2013-10-21 NOTE — Progress Notes (Addendum)
Patient ID: Laura Zuniga, female   DOB: 1917-11-19, 80 y.o.   MRN: [id number]         PROGRESS NOTE  DATE: 10/21/2013  FACILITY:  [facility]  LEVEL OF CARE: SNF (31)  Acute Visit  CHIEF COMPLAINT:  Manage Hyponatremia and C. Difficile colitis  HISTORY OF PRESENT ILLNESS: This is a 80 year old female who has NA 128 - low. She complains of having watery stool and crampy abdominal pain. Stool culture obtained shows C-Difficile toxin detected.   PAST MEDICAL HISTORY : Reviewed.  No changes/see problem list  CURRENT MEDICATIONS: Reviewed per MAR/see medication list  REVIEW OF SYSTEMS:  GENERAL: no change in appetite, no fatigue, no weight changes, no fever, chills or weakness RESPIRATORY: no cough, SOB, DOE,, wheezing, hemoptysis CARDIAC: no chest pain, or palpitations, +edema GI: no abdominal pain, constipation, heart burn, nausea or vomiting, +diarrhea  PHYSICAL EXAMINATION  GENERAL: no acute distress, normal body habitus EYES: conjunctivae normal, sclerae normal, normal eye lids NECK: supple, trachea midline, no neck masses, no thyroid tenderness, no thyromegaly LYMPHATICS: no LAN in the neck, no supraclavicular LAN RESPIRATORY: breathing is even & unlabored, BS CTAB CARDIAC: RRR, no murmur,no extra heart sounds, BLE edema 1+ GI: abdomen soft, normal BS, no masses, no tenderness, no hepatomegaly, no splenomegaly EXTREMITIES: able to move all 4 extremities; walks with walker PSYCHIATRIC: the patient is alert & oriented to person, affect & behavior appropriate  LABS/RADIOLOGY: 10/19/13  sodium 128 potassium 4.6 glucose 96 BUN 20 creatinine 1.0 calcium 8.7 albumin 2.9 glob 3.1 AST 18 ALT 20 ALP 70 10/15/13  WBC 9.5 hemoglobin 11.2 hematocrit 35.4 sodium 131 potassium 4.1 glucose 97 BUN 13 creatinine 1.0 calcium 9.2 vitamin B12 871 folate >1000 Labs reviewed: Basic Metabolic Panel:  Recent Labs  09/16/13 1858  09/21/13 0611 09/22/13 0640 09/23/13 0510   09/27/13 1730 09/28/13 0600  10/09/13 0004 10/11/13 0335 10/12/13 0236  NA  --   < > 131* 133* 137  < > 136* 135*  < > 137 135* 137  K  --   < > 3.6* 3.1* 3.4*  < > 3.3* 3.9  < > 4.9 3.5* 5.2  CL  --   < > 96 100 104  < > 96 94*  < > 93* 92* 96  CO2  --   < > 18* 18* 21  < > 30 33*  < > 35* 30 30  GLUCOSE  --   < > 87 91 84  < > 102* 108*  < > 103* 95 108*  BUN  --   < > 35* 31* 23  < > 6 5*  < > 17 11 13   CREATININE  --   < > 1.98* 1.58* 1.09  < > 0.65 0.69  < > 0.91 0.79 0.92  CALCIUM  --   < > 8.0* 8.1* 8.0*  < > 7.8* 7.9*  < > 8.8 9.5 9.6  MG 1.6  --   --   --   --   --  1.3* 1.8  --   --   --   --   PHOS 2.7  --  3.8 3.6 2.7  --   --   --   --   --   --   --   < > = values in this interval not displayed. Liver Function Tests:  Recent Labs  09/27/13 1730 09/29/13 1755 10/04/13 0410  AST 36 34 36  ALT 21 21  29  ALKPHOS 56 63 58  BILITOT 0.3 0.4 0.6  PROT 5.6* 5.8* 6.1  ALBUMIN 2.4* 2.5* 2.5*    Recent Labs  09/16/13 1304  LIPASE 13   CBC:  Recent Labs  09/16/13 1304  09/27/13 1730  10/03/13 1137 10/04/13 0410 10/04/13 1208  WBC 13.3*  < > 8.6  < > 8.0 5.7 6.3  NEUTROABS 10.4*  --  7.3  --  5.2  --   --   HGB 10.5*  < > 10.7*  < > 10.2* 9.3* 9.6*  HCT 31.0*  < > 32.0*  < > 31.2* 28.8* 29.9*  MCV 95.4  < > 95.8  < > 100.3* 99.0 99.3  PLT 197  < > 274  < > 245 234 240  < > = values in this interval not displayed.  Cardiac Enzymes:  Recent Labs  09/27/13 1730 09/28/13 0200 10/03/13 1137  TROPONINI <0.30 <0.30 <0.30    ASSESSMENT/PLAN:  Hyponatremia decrease Lasix @ lunch to 20 mg 1 tab PO and continue Lasix 80 mg PO Q AM; bmp in 1 week C. Difficile Colitis - start Flagyl 500 mg 1 PO TID X 10 days   CPT CODE: 82707  Laura Zuniga - NP [facility] [phone]

## 2013-10-23 ENCOUNTER — Ambulatory Visit (INDEPENDENT_AMBULATORY_CARE_PROVIDER_SITE_OTHER): Payer: Commercial Managed Care - HMO | Admitting: Physician Assistant

## 2013-10-23 ENCOUNTER — Encounter: Payer: Self-pay | Admitting: Physician Assistant

## 2013-10-23 ENCOUNTER — Telehealth: Payer: Self-pay | Admitting: *Deleted

## 2013-10-23 VITALS — BP 140/50 | HR 54 | Ht 61.0 in | Wt 126.8 lb

## 2013-10-23 DIAGNOSIS — I2510 Atherosclerotic heart disease of native coronary artery without angina pectoris: Secondary | ICD-10-CM

## 2013-10-23 DIAGNOSIS — I480 Paroxysmal atrial fibrillation: Secondary | ICD-10-CM

## 2013-10-23 DIAGNOSIS — I5032 Chronic diastolic (congestive) heart failure: Secondary | ICD-10-CM

## 2013-10-23 DIAGNOSIS — I4891 Unspecified atrial fibrillation: Secondary | ICD-10-CM

## 2013-10-23 DIAGNOSIS — I10 Essential (primary) hypertension: Secondary | ICD-10-CM

## 2013-10-23 LAB — BASIC METABOLIC PANEL
BUN: 14 mg/dL (ref 6–23)
CHLORIDE: 93 meq/L — AB (ref 96–112)
CO2: 30 mEq/L (ref 19–32)
Calcium: 9.1 mg/dL (ref 8.4–10.5)
Creatinine, Ser: 1 mg/dL (ref 0.4–1.2)
GFR: 52.43 mL/min — AB (ref >60.00)
Glucose, Bld: 92 mg/dL (ref 70–99)
Potassium: 4.3 mEq/L (ref 3.5–5.1)
Sodium: 131 mEq/L — ABNORMAL LOW (ref 135–145)

## 2013-10-23 NOTE — Progress Notes (Signed)
Cardiology Office Note    Date:  10/23/2013   ID:  LUNABELLA BADGETT, DOB 09-12-1917, MRN [id number]  PCP:  Wenda Low, MD  Cardiologist:  Dr. Casandra Doffing      History of Present Illness: Laura Zuniga is a 80 y.o. female with a history of presumed CAD (anterior wall abnormality on nuclear imaging in 9030), diastolic CHF, atrial fibrillation, COPD, HTN. Patient was admitted 6/10-6/24 with C. Difficile colitis complicated by atrial fibrillation with RVR. She was placed on amiodarone.  She was not felt to be a good candidate for anticoagulation.  She converted to NSR. She was then readmitted 6/27-7/6 with acute on chronic diastolic CHF.  She was diuresed with IV Lasix and d/c back to SNF.    She is currently staying at [facility]. Breathing is stable. LE edema is much improved. She denies orthopnea, PND. She has occasional chest discomfort. She tells me that this happens when she gets tired. She denies syncope, dizziness, near syncope.   Studies:  - Echo (09/28/13):  Mild LVH, EF 55-60%, mild AI, mild MR, moderate LAE, mild RAE, PASP 52 mm Hg    Recent Labs: 09/27/2013: TSH 8.390*  10/04/2013: ALT 29; Hemoglobin 9.6*  10/12/2013: Creatinine 0.92; Potassium 5.2; Pro B Natriuretic peptide (BNP) 2341.0*   Wt Readings from Last 3 Encounters:  10/23/13 126 lb 12.8 oz (57.516 kg)  10/21/13 135 lb 6.4 oz (61.417 kg)  10/12/13 136 lb 3.2 oz (61.78 kg)     Past Medical History  Diagnosis Date  . CHF (congestive heart failure)   . GERD (gastroesophageal reflux disease)   . Hypertension   . Diverticulitis   . UTI (urinary tract infection)   . Blockage of coronary artery of heart 2007    never had cardiac cath, CAD dx by positive ant. wall defect on Nuc. study.  no cath due to high risk.  . Macular degeneration     Current Outpatient Prescriptions  Medication Sig Dispense Refill  . acetaminophen (TYLENOL) 500 MG tablet Take 500-1,000 mg by mouth every 6 (six) hours as needed  for moderate pain or headache.      . albuterol (PROVENTIL) (2.5 MG/3ML) 0.083% nebulizer solution       . amiodarone (PACERONE) 200 MG tablet Take 1 tablet (200 mg total) by mouth daily.  30 tablet  3  . calcium-vitamin D (OSCAL WITH D) 500-200 MG-UNIT per tablet Take 1 tablet by mouth daily.      . clopidogrel (PLAVIX) 75 MG tablet Take 1 tablet (75 mg total) by mouth daily.  30 tablet  5  . Cranberry 250 MG CAPS Take 1 capsule by mouth daily.      . cyanocobalamin 500 MCG tablet Take 500 mcg by mouth daily.      Marland Kitchen escitalopram (LEXAPRO) 10 MG tablet Take 5 mg by mouth daily.      . ferrous sulfate 325 (65 FE) MG tablet Take 325 mg by mouth daily with breakfast.      . furosemide (LASIX) 40 MG tablet 2 tablet in am, and 1 tablet at noon  30 tablet  3  . hydrALAZINE (APRESOLINE) 10 MG tablet Take 1 tablet (10 mg total) by mouth every 8 (eight) hours.  60 tablet  3  . ipratropium-albuterol (DUONEB) 0.5-2.5 (3) MG/3ML SOLN Take 3 mLs by nebulization every 4 (four) hours as needed.  360 mL  2  . isosorbide mononitrate (IMDUR) 15 mg TB24 24 hr tablet Take 0.5 tablets (15  mg total) by mouth daily.  30 tablet  3  . levalbuterol (XOPENEX) 0.63 MG/3ML nebulizer solution Take 3 mLs by nebulization as needed.      . meclizine (ANTIVERT) 25 MG tablet Take 25 mg by mouth 2 (two) times daily as needed for dizziness.       . metroNIDAZOLE (FLAGYL) 500 MG tablet Take 500 mg by mouth 3 (three) times daily.      . Multiple Vitamin (MULITIVITAMIN WITH MINERALS) TABS Take 1 tablet by mouth daily.      . pantoprazole (PROTONIX) 40 MG tablet Take 40 mg by mouth daily.      . potassium chloride (K-DUR,KLOR-CON) 10 MEQ tablet Take 1 tablet (10 mEq total) by mouth once.  30 tablet  3  . pravastatin (PRAVACHOL) 40 MG tablet Take 40 mg by mouth daily.      . vitamin C (ASCORBIC ACID) 500 MG tablet Take 500 mg by mouth daily.       No current facility-administered medications for this visit.    Allergies:    Penicillins and Sulfonamide derivatives   Social History:  The patient  reports that she has never smoked. She does not have any smokeless tobacco history on file. She reports that she does not drink alcohol or use illicit drugs.   Family History:  The patient's family history includes COPD in her brother and sister; Cancer in her brother, brother, brother, sister, and sister; Cirrhosis in her brother; Heart attack in her sister; Heart attack (age of onset: 51) in her father; Heart disease in her brother.   ROS:  Please see the history of present illness.   Diarrhea has recently returned and she has been placed back on Flagyl.   All other systems reviewed and negative.   PHYSICAL EXAM: VS:  BP 140/50  Pulse 54  Ht 5\' 1"  (1.549 m)  Wt 126 lb 12.8 oz (57.516 kg)  BMI 23.97 kg/m2 Well nourished, well developed, in no acute distress HEENT: normal Neck: no JVD Cardiac:  normal S1, S2; RRR; no murmur Lungs:  clear to auscultation bilaterally, no wheezing, rhonchi or rales Abd: soft, nontender, no hepatomegaly Ext: trace bilateral LE edema Skin: warm and dry Neuro:  CNs 2-12 intact, no focal abnormalities noted  EKG:  Sinus bradycardia, HR 54, PACs versus junctional escape beat     ASSESSMENT AND PLAN:  1. Chronic diastolic heart failure:  Volume appears stable. Recent labs 10/19/13: Sodium 128, creatinine 1.0. She has recently started having more diarrhea. Repeat basic metabolic panel today. If her creatinine is worsening, consider reducing her Lasix dose. 2. Paroxysmal atrial fibrillation: Maintaining NSR. Heart rate is in the 50s. She is not symptomatic. Arrange 24-hour Holter to rule out significant pauses. Continue current dose of amiodarone. She is not a candidate for anticoagulation. 3. CAD: Continue medical therapy with Plavix, nitrates. 4. HYPERTENSION : Controlled. 5. Disposition:  F/u with Dr. Casandra Doffing in 4-6 weeks.    Signed, Versie Starks, MHS 10/23/2013 9:25 AM      [facility] [address], [place], [place]  [address] Phone: [phone]; Fax: [phone]

## 2013-10-23 NOTE — Patient Instructions (Signed)
LAB WORK TODAY, BMET  Your physician has recommended that you wear a 24 HOUR holter monitor. Holter monitors are medical devices that record the heart's electrical activity. Doctors most often use these monitors to diagnose arrhythmias. Arrhythmias are problems with the speed or rhythm of the heartbeat. The monitor is a small, portable device. You can wear one while you do your normal daily activities. This is usually used to diagnose what is causing palpitations/syncope (passing out).  Your physician recommends that you schedule a follow-up appointment in: 12/10/13 8:30 WITH DR. VARANASI

## 2013-10-23 NOTE — Telephone Encounter (Signed)
per pt's request to give all results to her daughter Hassan Rowan. Hassan Rowan notified about lab results with verbal understanding.

## 2013-10-26 ENCOUNTER — Encounter (INDEPENDENT_AMBULATORY_CARE_PROVIDER_SITE_OTHER): Payer: Medicare HMO

## 2013-10-26 ENCOUNTER — Encounter: Payer: Self-pay | Admitting: *Deleted

## 2013-10-26 DIAGNOSIS — I480 Paroxysmal atrial fibrillation: Secondary | ICD-10-CM

## 2013-10-26 DIAGNOSIS — I4891 Unspecified atrial fibrillation: Secondary | ICD-10-CM

## 2013-10-26 NOTE — Progress Notes (Signed)
Patient ID: Laura Zuniga, female   DOB: 04-26-1917, 80 y.o.   MRN: [id number] [facility] 24 hour holter monitor applied to patient.

## 2013-10-29 ENCOUNTER — Non-Acute Institutional Stay (SKILLED_NURSING_FACILITY): Payer: Commercial Managed Care - HMO | Admitting: Adult Health

## 2013-10-29 ENCOUNTER — Encounter: Payer: Self-pay | Admitting: Adult Health

## 2013-10-29 DIAGNOSIS — A0472 Enterocolitis due to Clostridium difficile, not specified as recurrent: Secondary | ICD-10-CM

## 2013-10-29 DIAGNOSIS — E785 Hyperlipidemia, unspecified: Secondary | ICD-10-CM

## 2013-10-29 DIAGNOSIS — J449 Chronic obstructive pulmonary disease, unspecified: Secondary | ICD-10-CM

## 2013-10-29 DIAGNOSIS — F329 Major depressive disorder, single episode, unspecified: Secondary | ICD-10-CM

## 2013-10-29 DIAGNOSIS — I10 Essential (primary) hypertension: Secondary | ICD-10-CM

## 2013-10-29 DIAGNOSIS — F3289 Other specified depressive episodes: Secondary | ICD-10-CM

## 2013-10-29 DIAGNOSIS — F0280 Dementia in other diseases classified elsewhere without behavioral disturbance: Secondary | ICD-10-CM

## 2013-10-29 DIAGNOSIS — K219 Gastro-esophageal reflux disease without esophagitis: Secondary | ICD-10-CM

## 2013-10-29 DIAGNOSIS — I5032 Chronic diastolic (congestive) heart failure: Secondary | ICD-10-CM

## 2013-10-29 DIAGNOSIS — I2510 Atherosclerotic heart disease of native coronary artery without angina pectoris: Secondary | ICD-10-CM

## 2013-10-29 DIAGNOSIS — D62 Acute posthemorrhagic anemia: Secondary | ICD-10-CM

## 2013-10-29 DIAGNOSIS — I4891 Unspecified atrial fibrillation: Secondary | ICD-10-CM

## 2013-10-29 NOTE — Progress Notes (Signed)
Patient ID: Laura Zuniga, female   DOB: 1918/03/03, 80 y.o.   MRN: [id number]         PROGRESS NOTE  DATE:  10/29/13  FACILITY:  [facility]  LEVEL OF CARE: SNF (31)  Acute Visit  CHIEF COMPLAINT:  Discharge Notes  HISTORY OF PRESENT ILLNESS: This is a 80 year old female who is for discharge home with Home health PT and Nursing. She has been admitted to [facility] on 10/12/13 from [facility] with Acute on chronic heart failure. Patient was admitted to this facility for short-term rehabilitation after the patient's recent hospitalization.  Patient has completed SNF rehabilitation and therapy has cleared the patient for discharge.   Reassessment of ongoing problem(s):  HTN: Pt 's HTN remains stable.  Denies CP, sob, DOE, pedal edema, headaches, dizziness or visual disturbances.  No complications from the medications currently being used.  Last BP : 132/57  ATRIAL FIBRILLATION: the patients atrial fibrillation remains stable.  The patient denies DOE, tachycardia, orthopnea, transient neurological sx, pedal edema, palpitations, & PNDs.  No complications noted from the medications currently being used.  COPD: the COPD remains stable.  Pt denies sob, cough, wheezing or declining exercise tolerance.  No complications from the medications presently being used.  PAST MEDICAL HISTORY : Reviewed.  No changes/see problem list  CURRENT MEDICATIONS: Reviewed per MAR/see medication list  REVIEW OF SYSTEMS:  GENERAL: no change in appetite, no fatigue, no weight changes, no fever, chills or weakness RESPIRATORY: no cough, SOB, DOE, wheezing, hemoptysis CARDIAC: no chest pain, or palpitations, +edema GI: no abdominal pain, constipation, heart burn, nausea or vomiting, +diarrhea  PHYSICAL EXAMINATION  GENERAL: no acute distress, normal body habitus NECK: supple, trachea midline, no neck masses, no thyroid tenderness, no thyromegaly LYMPHATICS: no LAN in the neck, no  supraclavicular LAN RESPIRATORY: breathing is even & unlabored, BS CTAB CARDIAC: RRR, no murmur,no extra heart sounds, BLE edema 1+ GI: abdomen soft, normal BS, no masses, no tenderness, no hepatomegaly, no splenomegaly EXTREMITIES: able to move all 4 extremities; walks with walker PSYCHIATRIC: the patient is alert & oriented to person, affect & behavior appropriate  LABS/RADIOLOGY: 10/26/13  sodium 134 potassium 5.3 glucose 84 BUN 12 creatinine 0.9 calcium 8.8 total protein 6.4 albumin 3.1 glob 3.3 TBIL 0.5 ALP78 AST 50 ALT31 10/22/13  sodium 131 potassium 5.8 glucose 83 BUN 14 creatinine 1.0 calcium 8.5 10/19/13  sodium 128 potassium 4.6 glucose 96 BUN 20 creatinine 1.0 calcium 8.7 albumin 2.9 glob 3.1 AST 18 ALT 20 ALP 70 10/15/13  WBC 9.5 hemoglobin 11.2 hematocrit 35.4 sodium 131 potassium 4.1 glucose 97 BUN 13 creatinine 1.0 calcium 9.2 vitamin B12 871 folate >1000 Labs reviewed: Basic Metabolic Panel:  Recent Labs  09/16/13 1858  09/21/13 0611 09/22/13 0640 09/23/13 0510  09/27/13 1730 09/28/13 0600  10/11/13 0335 10/12/13 0236 10/23/13 0943  NA  --   < > 131* 133* 137  < > 136* 135*  < > 135* 137 131*  K  --   < > 3.6* 3.1* 3.4*  < > 3.3* 3.9  < > 3.5* 5.2 4.3  CL  --   < > 96 100 104  < > 96 94*  < > 92* 96 93*  CO2  --   < > 18* 18* 21  < > 30 33*  < > 30 30 30   GLUCOSE  --   < > 87 91 84  < > 102* 108*  < > 95  108* 92  BUN  --   < > 35* 31* 23  < > 6 5*  < > 11 13 14   CREATININE  --   < > 1.98* 1.58* 1.09  < > 0.65 0.69  < > 0.79 0.92 1.0  CALCIUM  --   < > 8.0* 8.1* 8.0*  < > 7.8* 7.9*  < > 9.5 9.6 9.1  MG 1.6  --   --   --   --   --  1.3* 1.8  --   --   --   --   PHOS 2.7  --  3.8 3.6 2.7  --   --   --   --   --   --   --   < > = values in this interval not displayed. Liver Function Tests:  Recent Labs  09/27/13 1730 09/29/13 1755 10/04/13 0410  AST 36 34 36  ALT 21 21 29   ALKPHOS 56 63 58  BILITOT 0.3 0.4 0.6  PROT 5.6* 5.8* 6.1  ALBUMIN 2.4* 2.5* 2.5*     Recent Labs  09/16/13 1304  LIPASE 13   CBC:  Recent Labs  09/16/13 1304  09/27/13 1730  10/03/13 1137 10/04/13 0410 10/04/13 1208  WBC 13.3*  < > 8.6  < > 8.0 5.7 6.3  NEUTROABS 10.4*  --  7.3  --  5.2  --   --   HGB 10.5*  < > 10.7*  < > 10.2* 9.3* 9.6*  HCT 31.0*  < > 32.0*  < > 31.2* 28.8* 29.9*  MCV 95.4  < > 95.8  < > 100.3* 99.0 99.3  PLT 197  < > 274  < > 245 234 240  < > = values in this interval not displayed.  Cardiac Enzymes:  Recent Labs  09/27/13 1730 09/28/13 0200 10/03/13 1137  TROPONINI <0.30 <0.30 <0.30    ASSESSMENT/PLAN:  Chronic diastolic heart failure - stable; continue Lasix Hypertension - well controlled; continue hydralazine CAD - continue Imdur Atrial flutter fibrillation w/ RVR - rate controlled; continue amiodarone Anemia, acute diagnosis - stable; continue iron supplementation C. Difficile Colitis - continue Flagyl x2 more days COPD - stable; continue duo Neb when necessary and O2 when necessary GERD - continue Protonix Hyperlipidemia - continue Pravastatin Depression - stable; continue Lexapro Dementia - stable   I have filled out patient's discharge paperwork and written prescriptions.  Patient will receive home health PT and Nursing.   Total discharge time: Less than 30 minutes  Discharge time involved coordination of the discharge process with Education officer, museum, nursing staff and therapy department. Medical justification for home health services verified.   CPT CODE: 90240  Seth Bake - NP [facility] [phone]

## 2013-10-30 ENCOUNTER — Telehealth: Payer: Self-pay | Admitting: Physician Assistant

## 2013-10-30 NOTE — Telephone Encounter (Signed)
°  Patient's daughter would like heart monitor results. Please call and advise.

## 2013-10-30 NOTE — Telephone Encounter (Signed)
s/w pt's daughter Hassan Rowan that monitor report has not yet been read yet. Hassan Rowan states her sister was told when monitor was dropped off that they would hear something back in 24-48 hours. Explained report has to download, report put together for dr to read. She said "then should be ready sometime early next week". I said it should be and I will check on Tuesday when I am back in the office. Hassan Rowan said thank you.

## 2013-11-05 ENCOUNTER — Telehealth: Payer: Self-pay | Admitting: Cardiology

## 2013-11-05 NOTE — Telephone Encounter (Signed)
Dr. Hassell Done Interpretation of 24 Hour Holter: Lowest HR 48 bpm. Junctional Rhythm intermittently. No sx from bradycardia.  No afib.

## 2013-11-05 NOTE — Telephone Encounter (Signed)
lmtrc

## 2013-11-06 ENCOUNTER — Telehealth: Payer: Self-pay | Admitting: Interventional Cardiology

## 2013-11-06 MED ORDER — HYDRALAZINE HCL 25 MG PO TABS: 25.0000 mg | ORAL_TABLET | Freq: Three times a day (TID) | ORAL | Status: DC

## 2013-11-06 NOTE — Telephone Encounter (Signed)
Pt has had 1 dose of 10mg  hydralazine this am.  Daughter agrees to increased dose of hydralazine to 25mg  three times a day & will keep blood pressure log as advised  Horton Chin RN

## 2013-11-06 NOTE — Telephone Encounter (Signed)
Current blood pressure now is 140/70 Pt is working with OT at this time  I spoke with

## 2013-11-06 NOTE — Addendum Note (Signed)
Addended by: Joelyn Oms F on: 11/06/2013 12:36 PM   Modules accepted: Orders

## 2013-11-06 NOTE — Telephone Encounter (Signed)
New message     Returning Laura Zuniga's call to get monitor results

## 2013-11-06 NOTE — Telephone Encounter (Signed)
BP too high - please have her increase Hydralazine to 25mg  TID and check BP daily for a week and call with results

## 2013-11-06 NOTE — Telephone Encounter (Signed)
Daughter is aware of monitor results. Pt is now home from [facility] did have a question about the Hydralazine --- times to administer. Pt is taking it as directed  Current blood pressures & heart rate: 168/63 160/59 163/57  Heart rate ranges: 56-57  Laura Zuniga

## 2013-11-06 NOTE — Telephone Encounter (Signed)
Follow Up    Pt is returning a call from yesterday. Please call.

## 2013-11-06 NOTE — Telephone Encounter (Signed)
See previous telephone note from today ( 11/06/13) Horton Chin RN

## 2013-11-09 ENCOUNTER — Telehealth: Payer: Self-pay | Admitting: Interventional Cardiology

## 2013-11-09 NOTE — Telephone Encounter (Signed)
Spoke with patient's daughter, [name].  She was concerned about increasing hydralazine dose from 10mg  to 25mg  three times a day.

## 2013-11-09 NOTE — Telephone Encounter (Signed)
New message      Daughter has questions about her mother's bp medication (hydralazine).  It was increased from 10mg  to 25mg  t.i.d.

## 2013-11-09 NOTE — Telephone Encounter (Signed)
Laura Zuniga states it was her sister that called last week.   The home health nurse told Laura Zuniga that was a big jump in hydralazine dosing and daughter asked who ordered increased dose.  I reviewed phone notes with her from 11/06/13.  She states BP today has been 144/65 and 160/63.  Pt denies any lightheadedness or dizziness. She has complained of sluggishness and sleeping a lot.  I advised her to check pt's BP regularly. I advised her to call if she develops lightheadedness or dizziness and check her BP when she feels this way. I advised her to give her the hydralazine as close to every 8 hours as possible.  She verbalized understanding.

## 2013-11-10 NOTE — Telephone Encounter (Signed)
lmtrc

## 2013-11-19 NOTE — Telephone Encounter (Signed)
Pt's daughter notified.

## 2013-12-10 ENCOUNTER — Ambulatory Visit (INDEPENDENT_AMBULATORY_CARE_PROVIDER_SITE_OTHER): Payer: Commercial Managed Care - HMO | Admitting: Interventional Cardiology

## 2013-12-10 ENCOUNTER — Encounter: Payer: Self-pay | Admitting: Interventional Cardiology

## 2013-12-10 VITALS — BP 110/48 | HR 47 | Ht 61.0 in | Wt 136.0 lb

## 2013-12-10 DIAGNOSIS — E785 Hyperlipidemia, unspecified: Secondary | ICD-10-CM

## 2013-12-10 DIAGNOSIS — Z79899 Other long term (current) drug therapy: Secondary | ICD-10-CM

## 2013-12-10 DIAGNOSIS — I2510 Atherosclerotic heart disease of native coronary artery without angina pectoris: Secondary | ICD-10-CM

## 2013-12-10 DIAGNOSIS — I5032 Chronic diastolic (congestive) heart failure: Secondary | ICD-10-CM

## 2013-12-10 DIAGNOSIS — I10 Essential (primary) hypertension: Secondary | ICD-10-CM

## 2013-12-10 DIAGNOSIS — I4891 Unspecified atrial fibrillation: Secondary | ICD-10-CM

## 2013-12-10 DIAGNOSIS — I480 Paroxysmal atrial fibrillation: Secondary | ICD-10-CM

## 2013-12-10 LAB — BASIC METABOLIC PANEL
BUN: 17 mg/dL (ref 6–23)
CALCIUM: 9.3 mg/dL (ref 8.4–10.5)
CO2: 28 mEq/L (ref 19–32)
Chloride: 100 mEq/L (ref 96–112)
Creatinine, Ser: 1.1 mg/dL (ref 0.4–1.2)
GFR: 51.27 mL/min — AB (ref >60.00)
GLUCOSE: 72 mg/dL (ref 70–99)
Potassium: 3.8 mEq/L (ref 3.5–5.1)
SODIUM: 136 meq/L (ref 135–145)

## 2013-12-10 NOTE — Patient Instructions (Signed)
Your physician recommends that you return for lab work today for Bmet.   Your physician recommends that you continue on your current medications as directed. Please refer to the Current Medication list given to you today.  Your physician wants you to follow-up in: 6 months with Dr. Irish Lack. You will receive a reminder letter in the mail two months in advance. If you don't receive a letter, please call our office to schedule the follow-up appointment.

## 2013-12-10 NOTE — Progress Notes (Signed)
Patient ID: Laura Zuniga, female   DOB: May 26, 1917, 80 y.o.   MRN: [id number]    [place], [place], [place]  [address] Phone: [phone] Fax:  [phone]  Date:  12/10/2013   ID:  Laura Zuniga, DOB 12-Jan-1918, MRN [id number]  PCP:  Wenda Low, MD      History of Present Illness: Laura Zuniga is a 80 y.o. female who has CAD by nuclear stress test many years ago. She has had a rough last few months. She has been in and out of the hospital with recurrent C. difficile. She has had intermittent atrial fibrillation and bradycardia. She has had significant diastolic heart failure. She has been in several rehabilitation facilities as well. Overall, she is feeling better.   SHe has some dyspnea on exertion. She has had some improvement with Lasix. Many years ago, she had a markedly abnormal stress test. Her symptoms were controlled with medicines. Due to her age, we opted for more conservative therapy.  She denies any pain or pressure in her chest. She is tired. These issues have been going on for some time.  Holter 48 in 8/15 with lowest Hr 48, occasional junctional rhythm.  No AFib. No palpitations.    I asked about her potassium dose, hydralazine dose and whether or not she would be okay to go to [place] for some time. 2 sisters that live locally her having some issues of their own here. Her home situation may be more stable in [place] with her daughter who lives there.    Wt Readings from Last 3 Encounters:  12/10/13 136 lb (61.689 kg)  10/29/13 135 lb 12.8 oz (61.598 kg)  10/23/13 126 lb 12.8 oz (57.516 kg)     Past Medical History  Diagnosis Date  . CHF (congestive heart failure)   . GERD (gastroesophageal reflux disease)   . Hypertension   . Diverticulitis   . UTI (urinary tract infection)   . Blockage of coronary artery of heart 2007    never had cardiac cath, CAD dx by positive ant. wall defect on Nuc. study.  no cath due to high risk.  . Macular  degeneration     Current Outpatient Prescriptions  Medication Sig Dispense Refill  . acetaminophen (TYLENOL) 500 MG tablet Take 500-1,000 mg by mouth every 6 (six) hours as needed for moderate pain or headache.      Marland Kitchen amiodarone (PACERONE) 200 MG tablet Take 1 tablet (200 mg total) by mouth daily.  30 tablet  3  . calcium-vitamin D (OSCAL WITH D) 500-200 MG-UNIT per tablet Take 1 tablet by mouth daily.      . clopidogrel (PLAVIX) 75 MG tablet Take 1 tablet (75 mg total) by mouth daily.  30 tablet  5  . Cranberry 250 MG CAPS Take 1 capsule by mouth daily.      . cyanocobalamin 500 MCG tablet Take 500 mcg by mouth daily.      Marland Kitchen escitalopram (LEXAPRO) 10 MG tablet Take 5 mg by mouth daily.      . ferrous sulfate 325 (65 FE) MG tablet Take 325 mg by mouth daily with breakfast.      . furosemide (LASIX) 40 MG tablet 2 tablet in am, and 1 tablet at noon  30 tablet  3  . hydrALAZINE (APRESOLINE) 25 MG tablet Take 1 tablet (25 mg total) by mouth every 8 (eight) hours.  90 tablet  6  . isosorbide mononitrate (IMDUR) 60 MG 24  hr tablet Take 60 mg by mouth daily.      . Multiple Vitamin (MULITIVITAMIN WITH MINERALS) TABS Take 1 tablet by mouth daily.      . pantoprazole (PROTONIX) 40 MG tablet Take 40 mg by mouth daily.      . potassium chloride (K-DUR,KLOR-CON) 10 MEQ tablet Take 20 mEq by mouth daily.      . pravastatin (PRAVACHOL) 40 MG tablet Take 40 mg by mouth daily.      . vitamin C (ASCORBIC ACID) 500 MG tablet Take 500 mg by mouth daily.      . isosorbide mononitrate (IMDUR) 15 mg TB24 24 hr tablet Take 60 mg by mouth daily.       No current facility-administered medications for this visit.    Allergies:    Allergies  Allergen Reactions  . Penicillins     unknown  . Sulfonamide Derivatives     REACTION: Rash and swelling    Social History:  The patient  reports that she has never smoked. She does not have any smokeless tobacco history on file. She reports that she does not drink  alcohol or use illicit drugs.   Family History:  The patient's family history includes COPD in her brother and sister; Cancer in her brother, brother, brother, sister, and sister; Cirrhosis in her brother; Heart attack in her sister; Heart attack (age of onset: 80) in her father; Heart disease in her brother.   ROS:  Please see the history of present illness.  No nausea, vomiting.  No fevers, chills.  No focal weakness.  No dysuria. DOE   All other systems reviewed and negative.   PHYSICAL EXAM: VS:  BP 110/48  Pulse 47  Ht 5\' 1"  (1.549 m)  Wt 136 lb (61.689 kg)  BMI 25.71 kg/m2  SpO2 98% Well nourished, well developed, in no acute distress HEENT: normal Neck: no JVD, no carotid bruits Cardiac:  normal S1, S2; bradycardic Lungs:  clear to auscultation bilaterally, no wheezing, rhonchi or rales Abd: soft, nontender, no hepatomegaly Ext: no edema Skin: warm and dry Neuro:   no focal abnormalities noted      ASSESSMENT AND PLAN:  CAD  Continue Plavix Tablet, 75 MG, TAKE ONE TABLET BY MOUTH EVERY DAY; DOing well on long actong nitrates. IMAGING: EKG   Wellington,Stacy 10/02/2012 11:09:07 AM > Lonn Im,JAY 10/02/2012 11:26:34 AM > NSR, no ST segment changes   Notes: No angina.  Trying to avoid invasive testing. No indication for cath at this time.  2. Mixed hyperlipidemia  Continue Pravachol Tablet, 40MG , TAKE ONE TABLET BY MOUTH EVERY DAY Notes: LDL 77 in 2014 October.  3. Essential hypertension, benign  Off of Diovan Tablet, 160 MG, TAKE ONE TABLET BY MOUTH EVERY DAY.  Now on hydralazine 25 mg TID. Notes: Controlled. Some elevated readings at home. One episode occurred when she fell and hurt her knee. Most readings have been in the 962I systolic for her at home.  4. Dyspnea  Continue Lasix Tablet, 40 MG, 2 tablets in the am and 1 tablet in the pm, Orally, as directed Notes: Improved. Check BMet today.   Will dose potassium based on her labs today. Currently, she is taking twice  a day potassium supplement.  Known chronic diastolic heart failure.  She is fine to go to [place] with her daughter. Driving may be more convenient.    Signed Mina Marble, MD, [facility] 12/10/2013 8:57 AM

## 2014-02-23 ENCOUNTER — Other Ambulatory Visit: Payer: Self-pay | Admitting: Adult Health

## 2014-02-24 ENCOUNTER — Ambulatory Visit (INDEPENDENT_AMBULATORY_CARE_PROVIDER_SITE_OTHER): Payer: Commercial Managed Care - HMO | Admitting: Ophthalmology

## 2014-03-08 ENCOUNTER — Other Ambulatory Visit: Payer: Self-pay | Admitting: Interventional Cardiology

## 2014-03-11 ENCOUNTER — Ambulatory Visit (INDEPENDENT_AMBULATORY_CARE_PROVIDER_SITE_OTHER): Payer: Commercial Managed Care - HMO | Admitting: Ophthalmology

## 2014-03-11 DIAGNOSIS — H3531 Nonexudative age-related macular degeneration: Secondary | ICD-10-CM

## 2014-03-11 DIAGNOSIS — H35033 Hypertensive retinopathy, bilateral: Secondary | ICD-10-CM

## 2014-03-11 DIAGNOSIS — H43813 Vitreous degeneration, bilateral: Secondary | ICD-10-CM

## 2014-03-11 DIAGNOSIS — I10 Essential (primary) hypertension: Secondary | ICD-10-CM

## 2014-03-21 ENCOUNTER — Other Ambulatory Visit: Payer: Self-pay | Admitting: Interventional Cardiology

## 2014-04-20 ENCOUNTER — Telehealth: Payer: Self-pay | Admitting: Interventional Cardiology

## 2014-04-20 NOTE — Telephone Encounter (Signed)
I spoke with the patient's daughter. BP's as reported below and she was also checked this afternoon. Sitting BP was 162/74 HR- 52 and Standing- 132/61 HR- 50. She has been having some dizziness since Sunday, but today has felt ok. The patient's daughter is unsure if she may have eaten more salt then usual. She is currently in [place]. Medications verified. She has no complaint of headaches. I advised her to continue to monitor her BP's and I will forward to Dr. Beau Fanny for review. I will try to be back in touch with her regarding his recommendations. She is agreeable.

## 2014-04-20 NOTE — Telephone Encounter (Signed)
New message     Pt c/o BP issue:  1. What are your last 5 BP readings?  Today 181/53, 181/66, 154/51.  Yesterday 167/46 2. Are you having any other symptoms (ex. Dizziness, headache, blurred vision, passed out)? Dizzy from sitting to standing, and dizzy when she gets up in the mornings 3. What is your medication issue?  Pt has not missed any dosages. Pt started having dizziness starting Sunday.  Pt has been in [place] since the week after Christmas.  Should daughter increase medication?

## 2014-04-21 NOTE — Telephone Encounter (Signed)
OK to take an extra hydralazine 25 mg tab prn if Systolic is over 383

## 2014-04-22 NOTE — Telephone Encounter (Signed)
I spoke with the patient's daughter. She is aware of Dr. Hassell Done recommendations. She states that she will try giving her 2 tablets of hydralazine (total of 50 mg) in the AM if her SBP is > 160. She will give her her PM dose as scheduled. She reports that the patient has taken a third tablet during the day and has had trouble with some "shakes" before. I advised her to try the extra dose as needed and call if she has any issues. She will be bringing the patient back from [place] ~ 2/8. I have scheduled her to follow up on 2/10 at 9:00 AM with Dr. Irish Lack. Brenda verbalizes understanding.

## 2014-05-06 ENCOUNTER — Other Ambulatory Visit: Payer: Self-pay

## 2014-05-06 MED ORDER — HYDRALAZINE HCL 25 MG PO TABS: 25.0000 mg | ORAL_TABLET | Freq: Three times a day (TID) | ORAL | Status: DC

## 2014-05-06 MED ORDER — AMIODARONE HCL 200 MG PO TABS
200.0000 mg | ORAL_TABLET | Freq: Every day | ORAL | Status: DC
Start: 2014-05-06 — End: 2014-09-17

## 2014-05-06 MED ORDER — AMIODARONE HCL 200 MG PO TABS: 200.0000 mg | ORAL_TABLET | Freq: Every day | ORAL | Status: DC

## 2014-05-11 ENCOUNTER — Other Ambulatory Visit: Payer: Self-pay | Admitting: Interventional Cardiology

## 2014-05-19 ENCOUNTER — Ambulatory Visit: Payer: Commercial Managed Care - HMO | Admitting: Interventional Cardiology

## 2014-06-21 ENCOUNTER — Other Ambulatory Visit: Payer: Self-pay | Admitting: Dermatology

## 2014-07-01 ENCOUNTER — Other Ambulatory Visit: Payer: Self-pay | Admitting: Dermatology

## 2014-07-03 ENCOUNTER — Encounter (HOSPITAL_COMMUNITY): Payer: Self-pay

## 2014-07-03 ENCOUNTER — Emergency Department (HOSPITAL_COMMUNITY)
Admission: EM | Admit: 2014-07-03 | Discharge: 2014-07-03 | Disposition: A | Discharge status: Other Acute Inpatient Hospital | Payer: Medicare HMO | Source: Home / Self Care | Attending: Emergency Medicine | Admitting: Emergency Medicine

## 2014-07-03 ENCOUNTER — Encounter (HOSPITAL_COMMUNITY): Payer: Self-pay | Admitting: Emergency Medicine

## 2014-07-03 ENCOUNTER — Emergency Department (HOSPITAL_COMMUNITY)
Admission: EM | Admit: 2014-07-03 | Discharge: 2014-07-03 | Disposition: A | Discharge status: Home / Self Care | Payer: Commercial Managed Care - HMO | Attending: Emergency Medicine | Admitting: Emergency Medicine

## 2014-07-03 ENCOUNTER — Emergency Department (HOSPITAL_COMMUNITY): Payer: Commercial Managed Care - HMO

## 2014-07-03 DIAGNOSIS — S0003XA Contusion of scalp, initial encounter: Secondary | ICD-10-CM | POA: Diagnosis not present

## 2014-07-03 DIAGNOSIS — Y92000 Kitchen of unspecified non-institutional (private) residence as  the place of occurrence of the external cause: Secondary | ICD-10-CM | POA: Diagnosis not present

## 2014-07-03 DIAGNOSIS — I509 Heart failure, unspecified: Secondary | ICD-10-CM | POA: Diagnosis not present

## 2014-07-03 DIAGNOSIS — Y999 Unspecified external cause status: Secondary | ICD-10-CM | POA: Insufficient documentation

## 2014-07-03 DIAGNOSIS — Z880 Allergy status to penicillin: Secondary | ICD-10-CM | POA: Diagnosis not present

## 2014-07-03 DIAGNOSIS — I10 Essential (primary) hypertension: Secondary | ICD-10-CM | POA: Diagnosis not present

## 2014-07-03 DIAGNOSIS — Z87440 Personal history of urinary (tract) infections: Secondary | ICD-10-CM | POA: Diagnosis not present

## 2014-07-03 DIAGNOSIS — W01198A Fall on same level from slipping, tripping and stumbling with subsequent striking against other object, initial encounter: Secondary | ICD-10-CM | POA: Diagnosis not present

## 2014-07-03 DIAGNOSIS — K219 Gastro-esophageal reflux disease without esophagitis: Secondary | ICD-10-CM | POA: Insufficient documentation

## 2014-07-03 DIAGNOSIS — W19XXXA Unspecified fall, initial encounter: Secondary | ICD-10-CM

## 2014-07-03 DIAGNOSIS — Z8669 Personal history of other diseases of the nervous system and sense organs: Secondary | ICD-10-CM | POA: Insufficient documentation

## 2014-07-03 DIAGNOSIS — Y939 Activity, unspecified: Secondary | ICD-10-CM | POA: Diagnosis not present

## 2014-07-03 DIAGNOSIS — S0990XA Unspecified injury of head, initial encounter: Secondary | ICD-10-CM | POA: Diagnosis not present

## 2014-07-03 DIAGNOSIS — Z79899 Other long term (current) drug therapy: Secondary | ICD-10-CM | POA: Diagnosis not present

## 2014-07-03 LAB — COMPREHENSIVE METABOLIC PANEL
ALK PHOS: 88 U/L (ref 39–117)
ALT: 24 U/L (ref 0–35)
AST: 28 U/L (ref 0–37)
Albumin: 3.7 g/dL (ref 3.5–5.2)
Anion gap: 5 (ref 5–15)
BUN: 19 mg/dL (ref 6–23)
CALCIUM: 9.2 mg/dL (ref 8.4–10.5)
CHLORIDE: 100 mmol/L (ref 96–112)
CO2: 34 mmol/L — ABNORMAL HIGH (ref 19–32)
Creatinine, Ser: 1.48 mg/dL — ABNORMAL HIGH (ref 0.50–1.10)
GFR calc Af Amer: 34 mL/min — ABNORMAL LOW (ref >90)
GFR, EST NON AFRICAN AMERICAN: 29 mL/min — AB (ref >90)
GLUCOSE: 113 mg/dL — AB (ref 70–99)
Potassium: 3.5 mmol/L (ref 3.5–5.1)
SODIUM: 139 mmol/L (ref 135–145)
Total Bilirubin: 0.6 mg/dL (ref 0.3–1.2)
Total Protein: 7 g/dL (ref 6.0–8.3)

## 2014-07-03 LAB — CBC
HEMATOCRIT: 38.8 % (ref 36.0–46.0)
HEMOGLOBIN: 13 g/dL (ref 12.0–15.0)
MCH: 33.3 pg (ref 26.0–34.0)
MCHC: 33.5 g/dL (ref 30.0–36.0)
MCV: 99.5 fL (ref 78.0–100.0)
PLATELETS: 183 10*3/uL (ref 150–400)
RBC: 3.9 MIL/uL (ref 3.87–5.11)
RDW: 12.9 % (ref 11.5–15.5)
WBC: 10.5 10*3/uL (ref 4.0–10.5)

## 2014-07-03 LAB — PROTIME-INR
INR: 1.08 (ref 0.00–1.49)
PROTHROMBIN TIME: 14.2 s (ref 11.6–15.2)

## 2014-07-03 NOTE — ED Notes (Signed)
Pa Joe at bedside.

## 2014-07-03 NOTE — ED Notes (Signed)
Reportedly got her feet tangled in a throw rug, and fell aprox 1 hour PTA, striking the occipital area of her head on the floor. Denies LOC. Hematoma noted on scalp. Denies other injury. On Plavix. Dr Bridgett Larsson notified

## 2014-07-03 NOTE — ED Notes (Signed)
Joe PA at bedside 

## 2014-07-03 NOTE — ED Provider Notes (Signed)
CSN: [id number]     Arrival date & time 07/03/14  1904 History   First MD Initiated Contact with Patient 07/03/14 2127     Chief Complaint  Patient presents with  . Head Injury     (Consider location/radiation/quality/duration/timing/severity/associated sxs/prior Treatment) HPI Laura Zuniga is a 81 year old female with past medical history of CHF, GERD, hypertension, diverticulitis, CAD who presents the ER complaining of a mechanical fall. Patient states she is in her kitchen when she "tripped over a rug and fell backward landing on her bottom than her head floor". Patient reports immediate pain in the back of her head status post falling. Patient denies loss of consciousness, severe headache, dizziness, blurred vision, weakness, numbness, tingling, chest pain, shortness of breath, hip pain, abdominal pain, nausea, vomiting.  Past Medical History  Diagnosis Date  . CHF (congestive heart failure)   . GERD (gastroesophageal reflux disease)   . Hypertension   . Diverticulitis   . UTI (urinary tract infection)   . Blockage of coronary artery of heart 2007    never had cardiac cath, CAD dx by positive ant. wall defect on Nuc. study.  no cath due to high risk.  . Macular degeneration    Past Surgical History  Procedure Laterality Date  . Cholecystectomy    . Appendectomy    . Vaginal hysterectomy    . Eye surgery    . Esophagogastroduodenoscopy N/A 09/22/2013    Procedure: ESOPHAGOGASTRODUODENOSCOPY (EGD);  Surgeon: Lear Ng, MD;  Location: [facility] ENDOSCOPY;  Service: Endoscopy;  Laterality: N/A;  . Flexible sigmoidoscopy N/A 09/22/2013    Procedure: FLEXIBLE SIGMOIDOSCOPY;  Surgeon: Lear Ng, MD;  Location: [facility];  Service: Endoscopy;  Laterality: N/A;  . Flexible sigmoidoscopy N/A 09/22/2013    Procedure: FLEXIBLE SIGMOIDOSCOPY;  Surgeon: Lear Ng, MD;  Location: [facility];  Service: Endoscopy;  Laterality: N/A;  unprepped/ egd first  .  Esophagogastroduodenoscopy N/A 09/22/2013    Procedure: ESOPHAGOGASTRODUODENOSCOPY (EGD);  Surgeon: Lear Ng, MD;  Location: [facility] ENDOSCOPY;  Service: Endoscopy;  Laterality: N/A;   Family History  Problem Relation Age of Onset  . Heart attack Father 55  . Heart attack Sister   . Cancer Brother   . Cancer Brother   . Cirrhosis Brother   . Heart disease Brother   . Cancer Brother   . COPD Brother   . COPD Sister   . Cancer Sister   . Cancer Sister    History  Substance Use Topics  . Smoking status: Never Smoker   . Smokeless tobacco: Not on file  . Alcohol Use: No   OB History    No data available     Review of Systems  Constitutional: Negative for fever.  HENT: Negative for trouble swallowing.   Eyes: Negative for visual disturbance.  Respiratory: Negative for shortness of breath.   Cardiovascular: Negative for chest pain.  Gastrointestinal: Negative for nausea, vomiting and abdominal pain.  Genitourinary: Negative for dysuria.  Musculoskeletal: Negative for neck pain.  Skin: Negative for rash.  Neurological: Positive for headaches. Negative for dizziness, weakness and numbness.  Psychiatric/Behavioral: Negative.       Allergies  Penicillins and Sulfonamide derivatives  Home Medications   Prior to Admission medications   Medication Sig Start Date End Date Taking? Authorizing Provider  acetaminophen (TYLENOL) 500 MG tablet Take 500-1,000 mg by mouth every 6 (six) hours as needed for moderate pain or headache.   Yes Historical Provider, MD  albuterol (PROVENTIL HFA;VENTOLIN HFA)  108 (90 BASE) MCG/ACT inhaler Inhale 2 puffs into the lungs every 6 (six) hours as needed for wheezing or shortness of breath.   Yes Historical Provider, MD  amiodarone (PACERONE) 200 MG tablet Take 1 tablet (200 mg total) by mouth daily. 05/06/14  Yes Jettie Booze, MD  calcium-vitamin D (OSCAL WITH D) 500-200 MG-UNIT per tablet Take 1 tablet by mouth daily after lunch.    Yes  Historical Provider, MD  clopidogrel (PLAVIX) 75 MG tablet TAKE ONE TABLET BY MOUTH ONCE DAILY 03/22/14  Yes Jettie Booze, MD  Cranberry 250 MG CAPS Take 250 mg by mouth daily after lunch.    Yes Historical Provider, MD  cyanocobalamin 500 MCG tablet Take 500 mcg by mouth daily after lunch. Vitamin B12   Yes Historical Provider, MD  escitalopram (LEXAPRO) 10 MG tablet Take 10 mg by mouth daily.    Yes Historical Provider, MD  ferrous sulfate 325 (65 FE) MG tablet Take 325 mg by mouth daily after lunch.    Yes Historical Provider, MD  furosemide (LASIX) 40 MG tablet TAKE TWO TABLETS BY MOUTH IN THE MORNING, THEN TAKE ONE IN THE EVENING, YOU CAN TAKE AN EXTRA 40 MG IN THE EVENING IF NEEDED 05/13/14  Yes Jettie Booze, MD  hydrALAZINE (APRESOLINE) 25 MG tablet Take 1 tablet (25 mg total) by mouth every 8 (eight) hours. Patient taking differently: Take 25 mg by mouth 2 (two) times daily.  05/06/14  Yes Jettie Booze, MD  isosorbide mononitrate (IMDUR) 30 MG 24 hr tablet Take 30 mg by mouth daily.  07/02/14  Yes Historical Provider, MD  levothyroxine (SYNTHROID, LEVOTHROID) 50 MCG tablet Take 50 mcg by mouth daily before breakfast.  06/04/14  Yes Historical Provider, MD  Multiple Vitamins-Minerals (HAIR/SKIN/NAILS/BIOTIN) TABS Take 1 tablet by mouth daily after lunch.   Yes Historical Provider, MD  Multiple Vitamins-Minerals (ICAPS PO) Take 1 capsule by mouth 2 (two) times daily.   Yes Historical Provider, MD  mupirocin ointment (BACTROBAN) 2 % Apply 1 application topically daily as needed (wound care).  06/14/14  Yes Historical Provider, MD  pantoprazole (PROTONIX) 40 MG tablet Take 40 mg by mouth daily after lunch.    Yes Historical Provider, MD  polyethylene glycol (MIRALAX / GLYCOLAX) packet Take 17 g by mouth daily as needed (constipation). Mix in 8 oz juice and drink   Yes Historical Provider, MD  potassium chloride SA (K-DUR,KLOR-CON) 20 MEQ tablet Take 20 mEq by mouth 2 (two) times  daily.  05/20/14  Yes Historical Provider, MD  pravastatin (PRAVACHOL) 40 MG tablet Take 40 mg by mouth at bedtime.    Yes Historical Provider, MD  Tetrahydrozoline HCl (VISINE OP) Place 1 drop into both eyes daily as needed (itching).   Yes Historical Provider, MD  vitamin C (ASCORBIC ACID) 500 MG tablet Take 500 mg by mouth daily after lunch.    Yes Historical Provider, MD  vitamin E 400 UNIT capsule Take 400 Units by mouth daily after lunch.   Yes Historical Provider, MD   BP 146/104 mmHg  Pulse 49  Temp(Src) 98.3 F (36.8 C) (Oral)  Resp 27  Ht 5' (1.524 m)  Wt 140 lb (63.504 kg)  BMI 27.34 kg/m2  SpO2 99% Physical Exam  Constitutional: She is oriented to person, place, and time. She appears well-developed and well-nourished. No distress.  HENT:  Head: Normocephalic. Not macrocephalic and not microcephalic. Head is with contusion. Head is without raccoon's eyes, without Battle's sign, without laceration,  without right periorbital erythema and without left periorbital erythema.    Mouth/Throat: Uvula is midline and oropharynx is clear and moist. No trismus in the jaw. No uvula swelling. No oropharyngeal exudate, posterior oropharyngeal edema, posterior oropharyngeal erythema or tonsillar abscesses.  Eyes: Conjunctivae and EOM are normal. Pupils are equal, round, and reactive to light. Right eye exhibits no discharge. Left eye exhibits no discharge. No scleral icterus.  Neck: Trachea normal, normal range of motion and full passive range of motion without pain. Neck supple. No spinous process tenderness and no muscular tenderness present. No rigidity. No edema, no erythema and normal range of motion present. No Brudzinski's sign and no Kernig's sign noted.  Cardiovascular: Normal rate, regular rhythm and normal heart sounds.   No murmur heard. Pulmonary/Chest: Effort normal and breath sounds normal. No respiratory distress.  Abdominal: Soft. There is no tenderness.  Musculoskeletal:  Normal range of motion. She exhibits no edema or tenderness.  Neurological: She is alert and oriented to person, place, and time. She has normal strength. No cranial nerve deficit or sensory deficit. Coordination normal. GCS eye subscore is 4. GCS verbal subscore is 5. GCS motor subscore is 6.  Patient fully alert, answering questions appropriately in full, clear sentences. Cranial nerves II through XII grossly intact. Motor strength 5 out of 5 in all major muscle groups of upper and lower extremities. Distal sensation intact.  Skin: Skin is warm and dry. No rash noted. She is not diaphoretic.  Psychiatric: She has a normal mood and affect.  Nursing note and vitals reviewed.   ED Course  Procedures (including critical care time) Labs Review Labs Reviewed  COMPREHENSIVE METABOLIC PANEL - Abnormal; Notable for the following:    CO2 34 (*)    Glucose, Bld 113 (*)    Creatinine, Ser 1.48 (*)    GFR calc non Af Amer 29 (*)    GFR calc Af Amer 34 (*)    All other components within normal limits  CBC  PROTIME-INR    Imaging Review Ct Head Wo Contrast  07/03/2014   CLINICAL DATA:  81 year old female who tripped and fell hitting the back of head. Headache, on blood thinners. Recent surgery. Initial encounter.  EXAM: CT HEAD WITHOUT CONTRAST  TECHNIQUE: Contiguous axial images were obtained from the base of the skull through the vertex without intravenous contrast.  COMPARISON:  06/28/2011.  FINDINGS: Visualized paranasal sinuses and mastoids are clear. Large broad-based right posterior convexity scalp hematoma measuring up to 15 mm in thickness. Underlying calvarium intact. No other soft tissue injury. Chronic postoperative changes to the globes.  Calcified atherosclerosis at the skull base. Stable cerebral volume. No ventriculomegaly. No midline shift, mass effect, or evidence of intracranial mass lesion. Stable gray-white matter differentiation throughout the brain. No evidence of cortically based  acute infarction identified. No suspicious intracranial vascular hyperdensity. No acute intracranial hemorrhage identified.  IMPRESSION: Large scalp hematoma without underlying fracture. No acute traumatic injury to the brain identified.   Electronically Signed   By: Genevie Ann M.D.   On: 07/03/2014 21:06     EKG Interpretation   Date/Time:  Saturday July 03 2014 21:48:52 EDT Ventricular Rate:  50 PR Interval:  259 QRS Duration: 103 QT Interval:  560 QTC Calculation: 511 R Axis:   -28 Text Interpretation:  Sinus rhythm Prolonged PR interval Borderline left  axis deviation Low voltage, precordial leads Consider anterior infarct  Prolonged QT interval Confirmed by Lacinda Axon  MD, BRIAN ([id number]) on 07/03/2014  10:30:59  PM      MDM   Final diagnoses:  Scalp hematoma, initial encounter  Fall from standing, initial encounter    Patient here status post mechanical fall. Patient was complaining of mild pain in the back of her head with associated contusion noted. Based on patient's fall and the fact that she uses Plavix she was followed up with CT head without contrast.  CT head with impression: Large scalp hematoma without underlying fracture. No acute traumatic injury to the brain identified.  C-spine cleared with Nexus criteria. Based on the fact the patient can recall the entire event, her fall was mechanical, and patient's lab work is overall reassuring, patient's EKG shows bradycardia, however this appears to be at baseline for patient. Patient's daughter in the room states that patient's heart rate is typically in the 50s, throughout her ER stay patient's heart rate is consistently in the 45-55 range. This appears to be at baseline for patient based on previous notes and visits as well as what family members are stating. No concern for any cardiac events. Do not believe there is any medical abnormalities contribute in the patient's fall. Patient well-appearing, neuro exam benign, in no acute  distress. We'll discharge patient at this time, and have her follow-up with her primary care physician. Discussed return precautions with patient and her family, patient verbalizes understanding and agreement of this plan. I encouraged patient to call or return to the ER should she have any questions or concerns.  BP 146/104 mmHg  Pulse 49  Temp(Src) 98.3 F (36.8 C) (Oral)  Resp 27  Ht 5' (1.524 m)  Wt 140 lb (63.504 kg)  BMI 27.34 kg/m2  SpO2 99%  Signed,  Dahlia Bailiff, PA-C 12:52 AM  Patient seen and discussed with Dr. Nat Christen, MD   Dahlia Bailiff, PA-C 07/04/14 4801  Nat Christen, MD 07/07/14 1227

## 2014-07-03 NOTE — ED Notes (Signed)
Bruising noted to back of head, some swelling noted to area. Skin is intact.

## 2014-07-03 NOTE — ED Notes (Signed)
Pt reports slipping on a rug in her kitchen prior to arrival and hitting the back of her head on kitchen floor, denies LOC or other injuries.  Pt neurologically intact.  Hematoma noted to back of pt head, pt is on Plavix.

## 2014-07-03 NOTE — Discharge Instructions (Signed)
Contusion °A contusion is a deep bruise. Contusions are the result of an injury that caused bleeding under the skin. The contusion may turn blue, purple, or yellow. Minor injuries will give you a painless contusion, but more severe contusions may stay painful and swollen for a few weeks.  °CAUSES  °A contusion is usually caused by a blow, trauma, or direct force to an area of the body. °SYMPTOMS  °· Swelling and redness of the injured area. °· Bruising of the injured area. °· Tenderness and soreness of the injured area. °· Pain. °DIAGNOSIS  °The diagnosis can be made by taking a history and physical exam. An X-ray, CT scan, or MRI may be needed to determine if there were any associated injuries, such as fractures. °TREATMENT  °Specific treatment will depend on what area of the body was injured. In general, the best treatment for a contusion is resting, icing, elevating, and applying cold compresses to the injured area. Over-the-counter medicines may also be recommended for pain control. Ask your caregiver what the best treatment is for your contusion. °HOME CARE INSTRUCTIONS  °· Put ice on the injured area. °· Put ice in a plastic bag. °· Place a towel between your skin and the bag. °· Leave the ice on for 15-20 minutes, 3-4 times a day, or as directed by your health care provider. °· Only take over-the-counter or prescription medicines for pain, discomfort, or fever as directed by your caregiver. Your caregiver may recommend avoiding anti-inflammatory medicines (aspirin, ibuprofen, and naproxen) for 48 hours because these medicines may increase bruising. °· Rest the injured area. °· If possible, elevate the injured area to reduce swelling. °SEEK IMMEDIATE MEDICAL CARE IF:  °· You have increased bruising or swelling. °· You have pain that is getting worse. °· Your swelling or pain is not relieved with medicines. °MAKE SURE YOU:  °· Understand these instructions. °· Will watch your condition. °· Will get help right  away if you are not doing well or get worse. °Document Released: 01/03/2005 Document Revised: 03/31/2013 Document Reviewed: 01/29/2011 °Exit[employer]® Patient Information ©2015 [employer]. This information is not intended to replace advice given to you by your health care provider. Make sure you discuss any questions you have with your health care provider. ° ° °Head Injury °You have received a head injury. It does not appear serious at this time. Headaches and vomiting are common following head injury. It should be easy to awaken from sleeping. Sometimes it is necessary for you to stay in the emergency department for a while for observation. Sometimes admission to the hospital may be needed. After injuries such as yours, most problems occur within the first 24 hours, but side effects may occur up to 7-10 days after the injury. It is important for you to carefully monitor your condition and contact your health care provider or seek immediate medical care if there is a change in your condition. °WHAT ARE THE TYPES OF HEAD INJURIES? °Head injuries can be as minor as a bump. Some head injuries can be more severe. More severe head injuries include: °· A jarring injury to the brain (concussion). °· A bruise of the brain (contusion). This mean there is bleeding in the brain that can cause swelling. °· A cracked skull (skull fracture). °· Bleeding in the brain that collects, clots, and forms a bump (hematoma). °WHAT CAUSES A HEAD INJURY? °A serious head injury is most likely to happen to someone who is in a car wreck and is not   wearing a seat belt. Other causes of major head injuries include bicycle or motorcycle accidents, sports injuries, and falls. °HOW ARE HEAD INJURIES DIAGNOSED? °A complete history of the event leading to the injury and your current symptoms will be helpful in diagnosing head injuries. Many times, pictures of the brain, such as CT or MRI are needed to see the extent of the injury. Often, an overnight  hospital stay is necessary for observation.  °WHEN SHOULD I SEEK IMMEDIATE MEDICAL CARE?  °You should get help right away if: °· You have confusion or drowsiness. °· You feel sick to your stomach (nauseous) or have continued, forceful vomiting. °· You have dizziness or unsteadiness that is getting worse. °· You have severe, continued headaches not relieved by medicine. Only take over-the-counter or prescription medicines for pain, fever, or discomfort as directed by your health care provider. °· You do not have normal function of the arms or legs or are unable to walk. °· You notice changes in the black spots in the center of the colored part of your eye (pupil). °· You have a clear or bloody fluid coming from your nose or ears. °· You have a loss of vision. °During the next 24 hours after the injury, you must stay with someone who can watch you for the warning signs. This person should contact local emergency services (911 in the [place] if you have seizures, you become unconscious, or you are unable to wake up. °HOW CAN I PREVENT A HEAD INJURY IN THE FUTURE? °The most important factor for preventing major head injuries is avoiding motor vehicle accidents.  To minimize the potential for damage to your head, it is crucial to wear seat belts while riding in motor vehicles. Wearing helmets while bike riding and playing collision sports (like football) is also helpful. Also, avoiding dangerous activities around the house will further help reduce your risk of head injury.  °WHEN CAN I RETURN TO NORMAL ACTIVITIES AND ATHLETICS? °You should be reevaluated by your health care provider before returning to these activities. If you have any of the following symptoms, you should not return to activities or contact sports until 1 week after the symptoms have stopped: °· Persistent headache. °· Dizziness or vertigo. °· Poor attention and concentration. °· Confusion. °· Memory problems. °· Nausea or vomiting. °· Fatigue or tire  easily. °· Irritability. °· Intolerant of bright lights or loud noises. °· Anxiety or depression. °· Disturbed sleep. °MAKE SURE YOU:  °· Understand these instructions. °· Will watch your condition. °· Will get help right away if you are not doing well or get worse. °Document Released: 03/26/2005 Document Revised: 03/31/2013 Document Reviewed: 12/01/2012 °ExitCare® Patient Information ©2015 [employer]. This information is not intended to replace advice given to you by your health care provider. Make sure you discuss any questions you have with your health care provider. ° °

## 2014-07-03 NOTE — ED Provider Notes (Signed)
CSN: [id number]     Arrival date & time 07/03/14  1757 History   First MD Initiated Contact with Patient 07/03/14 1825     Chief Complaint  Patient presents with  . Fall   (Consider location/radiation/quality/duration/timing/severity/associated sxs/prior Treatment) HPI She is a 81 year old woman here with her daughter for evaluation of a fall. She fell this afternoon in her kitchen after tripping on an area rug. She hit the back of her head on the floor. She denies any loss of consciousness. No dizziness or vision changes. No focal numbness, tingling, weakness. She does take Plavix.  Past Medical History  Diagnosis Date  . CHF (congestive heart failure)   . GERD (gastroesophageal reflux disease)   . Hypertension   . Diverticulitis   . UTI (urinary tract infection)   . Blockage of coronary artery of heart 2007    never had cardiac cath, CAD dx by positive ant. wall defect on Nuc. study.  no cath due to high risk.  . Macular degeneration    Past Surgical History  Procedure Laterality Date  . Cholecystectomy    . Appendectomy    . Vaginal hysterectomy    . Eye surgery    . Esophagogastroduodenoscopy N/A 09/22/2013    Procedure: ESOPHAGOGASTRODUODENOSCOPY (EGD);  Surgeon: Lear Ng, MD;  Location: [facility] ENDOSCOPY;  Service: Endoscopy;  Laterality: N/A;  . Flexible sigmoidoscopy N/A 09/22/2013    Procedure: FLEXIBLE SIGMOIDOSCOPY;  Surgeon: Lear Ng, MD;  Location: [place];  Service: Endoscopy;  Laterality: N/A;  . Flexible sigmoidoscopy N/A 09/22/2013    Procedure: FLEXIBLE SIGMOIDOSCOPY;  Surgeon: Lear Ng, MD;  Location: [place];  Service: Endoscopy;  Laterality: N/A;  unprepped/ egd first  . Esophagogastroduodenoscopy N/A 09/22/2013    Procedure: ESOPHAGOGASTRODUODENOSCOPY (EGD);  Surgeon: Lear Ng, MD;  Location: [facility] ENDOSCOPY;  Service: Endoscopy;  Laterality: N/A;   Family History  Problem Relation Age of Onset  . Heart attack  Father 90  . Heart attack Sister   . Cancer Brother   . Cancer Brother   . Cirrhosis Brother   . Heart disease Brother   . Cancer Brother   . COPD Brother   . COPD Sister   . Cancer Sister   . Cancer Sister    History  Substance Use Topics  . Smoking status: Never Smoker   . Smokeless tobacco: Not on file  . Alcohol Use: No   OB History    No data available     Review of Systems As in history of present illness Allergies  Penicillins and Sulfonamide derivatives  Home Medications   Prior to Admission medications   Medication Sig Start Date End Date Taking? Authorizing Provider  amiodarone (PACERONE) 200 MG tablet Take 1 tablet (200 mg total) by mouth daily. 05/06/14  Yes Jettie Booze, MD  clopidogrel (PLAVIX) 75 MG tablet TAKE ONE TABLET BY MOUTH ONCE DAILY 03/22/14  Yes Jettie Booze, MD  escitalopram (LEXAPRO) 10 MG tablet Take 5 mg by mouth daily.   Yes Historical Provider, MD  furosemide (LASIX) 40 MG tablet TAKE TWO TABLETS BY MOUTH IN THE MORNING, THEN TAKE ONE IN THE EVENING, YOU CAN TAKE AN EXTRA 40 MG IN THE EVENING IF NEEDED 05/13/14  Yes Jettie Booze, MD  hydrALAZINE (APRESOLINE) 25 MG tablet Take 1 tablet (25 mg total) by mouth every 8 (eight) hours. 05/06/14  Yes Jettie Booze, MD  isosorbide mononitrate (IMDUR) 60 MG 24 hr tablet Take 60 mg  by mouth daily.   Yes Historical Provider, MD  pantoprazole (PROTONIX) 40 MG tablet Take 40 mg by mouth daily.   Yes Historical Provider, MD  potassium chloride (K-DUR,KLOR-CON) 10 MEQ tablet Take 20 mEq by mouth daily. 10/12/13  Yes Wenda Low, MD  pravastatin (PRAVACHOL) 40 MG tablet Take 40 mg by mouth daily.   Yes Historical Provider, MD  acetaminophen (TYLENOL) 500 MG tablet Take 500-1,000 mg by mouth every 6 (six) hours as needed for moderate pain or headache.    Historical Provider, MD  calcium-vitamin D (OSCAL WITH D) 500-200 MG-UNIT per tablet Take 1 tablet by mouth daily.    Historical  Provider, MD  Cranberry 250 MG CAPS Take 1 capsule by mouth daily.    Historical Provider, MD  cyanocobalamin 500 MCG tablet Take 500 mcg by mouth daily.    Historical Provider, MD  ferrous sulfate 325 (65 FE) MG tablet Take 325 mg by mouth daily with breakfast.    Historical Provider, MD  Multiple Vitamin (MULITIVITAMIN WITH MINERALS) TABS Take 1 tablet by mouth daily.    Historical Provider, MD  vitamin C (ASCORBIC ACID) 500 MG tablet Take 500 mg by mouth daily.    Historical Provider, MD   BP 162/57 mmHg  Pulse 55  Temp(Src) 97.5 F (36.4 C) (Oral)  Resp 16  SpO2 95% Physical Exam  Constitutional: She is oriented to person, place, and time. She appears well-developed and well-nourished. No distress.  Neck: Neck supple.  Cardiovascular: Normal rate.   Pulmonary/Chest: Effort normal.  Neurological: She is alert and oriented to person, place, and time.  Grossly normal  Skin:  3 by 4 cm area of swelling and bruising on posterior scalp    ED Course  Procedures (including critical care time) Labs Review Labs Reviewed - No data to display  Imaging Review No results found.   MDM   1. Head injury, initial encounter    We'll transfer to the emergency room via shuttle for imaging to rule out subdural hematoma. Her vital signs and neurologic exam are stable.    Melony Overly, MD 07/03/14 [phone]

## 2014-07-03 NOTE — ED Notes (Signed)
Dr Cook at bedside

## 2014-07-21 ENCOUNTER — Other Ambulatory Visit: Payer: Self-pay

## 2014-07-21 MED ORDER — ISOSORBIDE MONONITRATE ER 60 MG PO TB24: 30.0000 mg | ORAL_TABLET | Freq: Every day | ORAL | Status: DC

## 2014-07-23 ENCOUNTER — Encounter (HOSPITAL_COMMUNITY): Payer: Self-pay | Admitting: Emergency Medicine

## 2014-07-23 ENCOUNTER — Emergency Department (HOSPITAL_COMMUNITY)
Admission: EM | Admit: 2014-07-23 | Discharge: 2014-07-23 | Disposition: A | Discharge status: Home / Self Care | Payer: Commercial Managed Care - HMO | Source: Home / Self Care | Attending: Family Medicine | Admitting: Family Medicine

## 2014-07-23 ENCOUNTER — Emergency Department (INDEPENDENT_AMBULATORY_CARE_PROVIDER_SITE_OTHER): Payer: Commercial Managed Care - HMO

## 2014-07-23 DIAGNOSIS — S300XXA Contusion of lower back and pelvis, initial encounter: Secondary | ICD-10-CM

## 2014-07-23 DIAGNOSIS — R52 Pain, unspecified: Secondary | ICD-10-CM

## 2014-07-23 NOTE — Discharge Instructions (Signed)
Heat and tylenol as needed for comfort, return as needed.

## 2014-07-23 NOTE — ED Provider Notes (Addendum)
CSN: [id number]     Arrival date & time 07/23/14  1713 History   First MD Initiated Contact with Patient 07/23/14 1755     Chief Complaint  Patient presents with  . Follow-up   (Consider location/radiation/quality/duration/timing/severity/associated sxs/prior Treatment) Patient is a 81 y.o. female presenting with hip pain. The history is provided by the patient and a relative.  Hip Pain This is a new problem. The current episode started more than 1 week ago (seen in er 3/26 s/p fall with head contusion, now with worsening right gluteal /hip pain., worse with walking.). The problem has been gradually worsening. Pertinent negatives include no chest pain and no abdominal pain.    Past Medical History  Diagnosis Date  . CHF (congestive heart failure)   . GERD (gastroesophageal reflux disease)   . Hypertension   . Diverticulitis   . UTI (urinary tract infection)   . Blockage of coronary artery of heart 2007    never had cardiac cath, CAD dx by positive ant. wall defect on Nuc. study.  no cath due to high risk.  . Macular degeneration    Past Surgical History  Procedure Laterality Date  . Cholecystectomy    . Appendectomy    . Vaginal hysterectomy    . Eye surgery    . Esophagogastroduodenoscopy N/A 09/22/2013    Procedure: ESOPHAGOGASTRODUODENOSCOPY (EGD);  Surgeon: Lear Ng, MD;  Location: [facility] ENDOSCOPY;  Service: Endoscopy;  Laterality: N/A;  . Flexible sigmoidoscopy N/A 09/22/2013    Procedure: FLEXIBLE SIGMOIDOSCOPY;  Surgeon: Lear Ng, MD;  Location: [place];  Service: Endoscopy;  Laterality: N/A;  . Flexible sigmoidoscopy N/A 09/22/2013    Procedure: FLEXIBLE SIGMOIDOSCOPY;  Surgeon: Lear Ng, MD;  Location: [place];  Service: Endoscopy;  Laterality: N/A;  unprepped/ egd first  . Esophagogastroduodenoscopy N/A 09/22/2013    Procedure: ESOPHAGOGASTRODUODENOSCOPY (EGD);  Surgeon: Lear Ng, MD;  Location: [facility] ENDOSCOPY;  Service:  Endoscopy;  Laterality: N/A;   Family History  Problem Relation Age of Onset  . Heart attack Father 5  . Heart attack Sister   . Cancer Brother   . Cancer Brother   . Cirrhosis Brother   . Heart disease Brother   . Cancer Brother   . COPD Brother   . COPD Sister   . Cancer Sister   . Cancer Sister    History  Substance Use Topics  . Smoking status: Never Smoker   . Smokeless tobacco: Not on file  . Alcohol Use: No   OB History    No data available     Review of Systems  Constitutional: Negative.   Cardiovascular: Negative for chest pain.  Gastrointestinal: Negative.  Negative for abdominal pain.  Genitourinary: Negative.   Musculoskeletal: Positive for gait problem.    Allergies  Penicillins and Sulfonamide derivatives  Home Medications   Prior to Admission medications   Medication Sig Start Date End Date Taking? Authorizing Provider  acetaminophen (TYLENOL) 500 MG tablet Take 500-1,000 mg by mouth every 6 (six) hours as needed for moderate pain or headache.    Historical Provider, MD  albuterol (PROVENTIL HFA;VENTOLIN HFA) 108 (90 BASE) MCG/ACT inhaler Inhale 2 puffs into the lungs every 6 (six) hours as needed for wheezing or shortness of breath.    Historical Provider, MD  amiodarone (PACERONE) 200 MG tablet Take 1 tablet (200 mg total) by mouth daily. 05/06/14   Jettie Booze, MD  calcium-vitamin D (OSCAL WITH D) 500-200 MG-UNIT per tablet Take  1 tablet by mouth daily after lunch.     Historical Provider, MD  clopidogrel (PLAVIX) 75 MG tablet TAKE ONE TABLET BY MOUTH ONCE DAILY 03/22/14   Jettie Booze, MD  Cranberry 250 MG CAPS Take 250 mg by mouth daily after lunch.     Historical Provider, MD  cyanocobalamin 500 MCG tablet Take 500 mcg by mouth daily after lunch. Vitamin B12    Historical Provider, MD  escitalopram (LEXAPRO) 10 MG tablet Take 10 mg by mouth daily.     Historical Provider, MD  ferrous sulfate 325 (65 FE) MG tablet Take 325 mg by  mouth daily after lunch.     Historical Provider, MD  furosemide (LASIX) 40 MG tablet TAKE TWO TABLETS BY MOUTH IN THE MORNING, THEN TAKE ONE IN THE EVENING, YOU CAN TAKE AN EXTRA 40 MG IN THE EVENING IF NEEDED 05/13/14   Jettie Booze, MD  hydrALAZINE (APRESOLINE) 25 MG tablet Take 1 tablet (25 mg total) by mouth every 8 (eight) hours. Patient taking differently: Take 25 mg by mouth 2 (two) times daily.  05/06/14   Jettie Booze, MD  isosorbide mononitrate (IMDUR) 60 MG 24 hr tablet Take 0.5 tablets (30 mg total) by mouth daily. 07/21/14   Jettie Booze, MD  levothyroxine (SYNTHROID, LEVOTHROID) 50 MCG tablet Take 50 mcg by mouth daily before breakfast.  06/04/14   Historical Provider, MD  Multiple Vitamins-Minerals (HAIR/SKIN/NAILS/BIOTIN) TABS Take 1 tablet by mouth daily after lunch.    Historical Provider, MD  Multiple Vitamins-Minerals (ICAPS PO) Take 1 capsule by mouth 2 (two) times daily.    Historical Provider, MD  mupirocin ointment (BACTROBAN) 2 % Apply 1 application topically daily as needed (wound care).  06/14/14   Historical Provider, MD  pantoprazole (PROTONIX) 40 MG tablet Take 40 mg by mouth daily after lunch.     Historical Provider, MD  polyethylene glycol (MIRALAX / GLYCOLAX) packet Take 17 g by mouth daily as needed (constipation). Mix in 8 oz juice and drink    Historical Provider, MD  potassium chloride SA (K-DUR,KLOR-CON) 20 MEQ tablet Take 20 mEq by mouth 2 (two) times daily.  05/20/14   Historical Provider, MD  pravastatin (PRAVACHOL) 40 MG tablet Take 40 mg by mouth at bedtime.     Historical Provider, MD  Tetrahydrozoline HCl (VISINE OP) Place 1 drop into both eyes daily as needed (itching).    Historical Provider, MD  vitamin C (ASCORBIC ACID) 500 MG tablet Take 500 mg by mouth daily after lunch.     Historical Provider, MD  vitamin E 400 UNIT capsule Take 400 Units by mouth daily after lunch.    Historical Provider, MD   BP 86/64 mmHg  Pulse 78  Temp(Src)  98.1 F (36.7 C) (Oral)  SpO2 100% Physical Exam  Constitutional: She is oriented to person, place, and time. She appears well-developed and well-nourished. No distress.  HENT:  Head: Normocephalic.  Musculoskeletal: She exhibits tenderness.       Right hip: She exhibits tenderness and bony tenderness.       Legs: Neurological: She is alert and oriented to person, place, and time.  Skin: Skin is warm and dry.  Nursing note and vitals reviewed.   ED Course  Procedures (including critical care time) Labs Review Labs Reviewed - No data to display  Imaging Review Dg Hip Unilat With Pelvis 2-3 Views Right  07/23/2014   CLINICAL DATA:  [place] 3 weeks ago.  Persistent right hip pain.  EXAM: RIGHT HIP (WITH PELVIS) 2-3 VIEWS  COMPARISON:  None  FINDINGS: No evidence of pelvic fracture or hip fracture. Standing views do not show any joint space narrowing. Sacroiliac joints appear normal. There is curvature and degenerative change in the lower lumbar spine.  IMPRESSION: No traumatic finding.   Electronically Signed   By: Nelson Chimes M.D.   On: 07/23/2014 18:26    X-rays reviewed and report per radiologist.  MDM   1. Contusion, buttock, initial encounter   2. Pain aggravated by activities of daily living        Billy Fischer, MD 07/23/14 [place], MD 07/23/14 Bosie Helper

## 2014-07-23 NOTE — ED Notes (Signed)
Pt fell 3 weeks ago and is here for follow up.

## 2014-09-10 ENCOUNTER — Ambulatory Visit (INDEPENDENT_AMBULATORY_CARE_PROVIDER_SITE_OTHER): Payer: Commercial Managed Care - HMO | Admitting: Interventional Cardiology

## 2014-09-10 ENCOUNTER — Encounter: Payer: Self-pay | Admitting: Interventional Cardiology

## 2014-09-10 VITALS — BP 152/60 | HR 42 | Ht 61.0 in | Wt 141.8 lb

## 2014-09-10 DIAGNOSIS — I5032 Chronic diastolic (congestive) heart failure: Secondary | ICD-10-CM

## 2014-09-10 DIAGNOSIS — I480 Paroxysmal atrial fibrillation: Secondary | ICD-10-CM | POA: Diagnosis not present

## 2014-09-10 DIAGNOSIS — R001 Bradycardia, unspecified: Secondary | ICD-10-CM | POA: Diagnosis not present

## 2014-09-10 DIAGNOSIS — N183 Chronic kidney disease, stage 3 (moderate): Secondary | ICD-10-CM

## 2014-09-10 NOTE — Patient Instructions (Addendum)
**Note De-Identified  Obfuscation** Medication Instructions:  Same-no change. May use Neosporin on leg cuts  Labwork: None  Testing/Procedures: None  Follow-Up: Your physician wants you to follow-up in: 9 months. You will receive a reminder letter in the mail two months in advance. If you don't receive a letter, please call our office to schedule the follow-up appointment.

## 2014-09-10 NOTE — Progress Notes (Signed)
Patient ID: Laura Zuniga, female   DOB: 04-05-1918, 81 y.o.   MRN: [id number]     Cardiology Office Note   Date:  09/10/2014   ID:  Laura Zuniga, DOB April 23, 1917, MRN [id number]  PCP:  Wenda Low, MD    No chief complaint on file.  follow-up atrial fibrillation   Wt Readings from Last 3 Encounters:  09/10/14 141 lb 12.8 oz (64.32 kg)  07/03/14 140 lb (63.504 kg)  12/10/13 136 lb (61.689 kg)       History of Present Illness: Laura Zuniga is a 81 y.o. female  who has CAD by nuclear stress test many years ago. She has had stays in the hospital with recurrent C. Difficile about a year ago. She has had intermittent atrial fibrillation and bradycardia. She has had significant diastolic heart failure. Overall, she is feeling better.   SHe has some dyspnea on exertion. She has had some improvement with Lasix. Many years ago, she had a markedly abnormal stress test. Her symptoms were controlled with medicines. Due to her age, we opted for more conservative therapy.  She denies any pain or pressure in her chest. She is tired. These issues have been going on for some time.  Holter 48 in 8/15 with lowest Hr 48, occasional junctional rhythm. No AFib. No palpitations.   She spends some time in [place] and some time in [place].  The daughter who comes with her today states that she sleeps a lot. The patient denies any chest pain or shortness of breath. She is not having any trouble lying flat. Her appetite is good. Overall, she wishes she could do more but is satisfied with her ability to carry out her activities of daily living.     Past Medical History  Diagnosis Date  . CHF (congestive heart failure)   . GERD (gastroesophageal reflux disease)   . Hypertension   . Diverticulitis   . UTI (urinary tract infection)   . Blockage of coronary artery of heart 2007    never had cardiac cath, CAD dx by positive ant. wall defect on Nuc. study.  no cath due to high risk.  .  Macular degeneration     Past Surgical History  Procedure Laterality Date  . Cholecystectomy    . Appendectomy    . Vaginal hysterectomy    . Eye surgery    . Esophagogastroduodenoscopy N/A 09/22/2013    Procedure: ESOPHAGOGASTRODUODENOSCOPY (EGD);  Surgeon: Lear Ng, MD;  Location: [facility] ENDOSCOPY;  Service: Endoscopy;  Laterality: N/A;  . Flexible sigmoidoscopy N/A 09/22/2013    Procedure: FLEXIBLE SIGMOIDOSCOPY;  Surgeon: Lear Ng, MD;  Location: [place];  Service: Endoscopy;  Laterality: N/A;  . Flexible sigmoidoscopy N/A 09/22/2013    Procedure: FLEXIBLE SIGMOIDOSCOPY;  Surgeon: Lear Ng, MD;  Location: [place];  Service: Endoscopy;  Laterality: N/A;  unprepped/ egd first  . Esophagogastroduodenoscopy N/A 09/22/2013    Procedure: ESOPHAGOGASTRODUODENOSCOPY (EGD);  Surgeon: Lear Ng, MD;  Location[facility] ENDOSCOPY;  Service: Endoscopy;  Laterality: N/A;     Current Outpatient Prescriptions  Medication Sig Dispense Refill  . acetaminophen (TYLENOL) 500 MG tablet Take 500-1,000 mg by mouth every 6 (six) hours as needed for moderate pain or headache.    . albuterol (PROVENTIL HFA;VENTOLIN HFA) 108 (90 BASE) MCG/ACT inhaler Inhale 2 puffs into the lungs every 6 (six) hours as needed for wheezing or shortness of breath.    Marland Kitchen amiodarone (PACERONE) 200 MG tablet Take 1 tablet (  200 mg total) by mouth daily. 90 tablet 1  . calcium-vitamin D (OSCAL WITH D) 500-200 MG-UNIT per tablet Take 1 tablet by mouth daily after lunch.     . clopidogrel (PLAVIX) 75 MG tablet TAKE ONE TABLET BY MOUTH ONCE DAILY 30 tablet 3  . Cranberry 250 MG CAPS Take 250 mg by mouth daily after lunch.     . cyanocobalamin 500 MCG tablet Take 500 mcg by mouth daily after lunch. Vitamin B12    . escitalopram (LEXAPRO) 10 MG tablet Take 10 mg by mouth daily.     . ferrous sulfate 325 (65 FE) MG tablet Take 325 mg by mouth daily after lunch.     . furosemide (LASIX) 40 MG tablet  TAKE TWO TABLETS BY MOUTH IN THE MORNING, THEN TAKE ONE IN THE EVENING, YOU CAN TAKE AN EXTRA 40 MG IN THE EVENING IF NEEDED 90 tablet 6  . hydrALAZINE (APRESOLINE) 25 MG tablet Take 25 mg by mouth 3 (three) times daily.    . isosorbide mononitrate (IMDUR) 60 MG 24 hr tablet Take 0.5 tablets (30 mg total) by mouth daily. 90 tablet 1  . levothyroxine (SYNTHROID, LEVOTHROID) 50 MCG tablet Take 50 mcg by mouth daily before breakfast.     . Multiple Vitamins-Minerals (HAIR/SKIN/NAILS/BIOTIN) TABS Take 1 tablet by mouth daily after lunch.    . Multiple Vitamins-Minerals (ICAPS PO) Take 1 capsule by mouth 2 (two) times daily.    . mupirocin ointment (BACTROBAN) 2 % Apply 1 application topically daily as needed (wound care).     . pantoprazole (PROTONIX) 40 MG tablet Take 40 mg by mouth daily after lunch.     . polyethylene glycol (MIRALAX / GLYCOLAX) packet Take 17 g by mouth daily as needed (constipation). Mix in 8 oz juice and drink    . potassium chloride SA (K-DUR,KLOR-CON) 20 MEQ tablet Take 20 mEq by mouth 2 (two) times daily.     . pravastatin (PRAVACHOL) 40 MG tablet Take 40 mg by mouth at bedtime.     . Tetrahydrozoline HCl (VISINE OP) Place 1 drop into both eyes daily as needed (itching).    . vitamin C (ASCORBIC ACID) 500 MG tablet Take 500 mg by mouth daily after lunch.     . vitamin E 400 UNIT capsule Take 400 Units by mouth daily after lunch.     No current facility-administered medications for this visit.    Allergies:   Penicillins and Sulfonamide derivatives    Social History:  The patient  reports that she has never smoked. She has never used smokeless tobacco. She reports that she does not drink alcohol or use illicit drugs.   Family History:  The patient's *family history includes COPD in her brother and sister; Cancer in her brother, brother, brother, sister, and sister; Cirrhosis in her brother; Heart attack in her sister; Heart attack (age of onset: 66) in her father; Heart  disease in her brother.    ROS:  Please see the history of present illness.   Otherwise, review of systems are positive for sleeping a lot.   All other systems are reviewed and negative.    PHYSICAL EXAM: VS:  BP 152/60 mmHg  Pulse 42  Ht 5\' 1"  (1.549 m)  Wt 141 lb 12.8 oz (64.32 kg)  BMI 26.81 kg/m2  SpO2 96% , BMI Body mass index is 26.81 kg/(m^2). GEN: Well nourished, well developed, in no acute distress HEENT: normal Neck: no JVD, carotid bruits, or masses Cardiac: Bradycardic;  no murmurs, rubs, or gallops,no edema  Respiratory:  clear to auscultation bilaterally, normal work of breathing GI: soft, nontender, nondistended, + BS MS: no deformity or atrophy Skin: warm and dry, no rash Neuro:  Strength and sensation are intact Psych: euthymic mood, full affect   EKG:   The ekg  demonstrates sinus bradycardia in March 2016   Recent Labs: 09/27/2013: TSH 8.390* 09/28/2013: Magnesium 1.8 10/12/2013: Pro B Natriuretic peptide (BNP) 2341.0* 07/03/2014: ALT 24; BUN 19; Creatinine 1.48*; Hemoglobin 13.0; Platelets 183; Potassium 3.5; Sodium 139   Lipid Panel    Component Value Date/Time   CHOL 143 06/28/2011 2230   TRIG 177* 06/28/2011 2230   HDL 38* 06/28/2011 2230   CHOLHDL 3.8 06/28/2011 2230   VLDL 35 06/28/2011 2230   LDLCALC 70 06/28/2011 2230     Other studies Reviewed: Additional studies/ records that were reviewed today with results demonstrating: Prior abnormal nuclear stress test.   ASSESSMENT AND PLAN:  CAD  Continue Plavix Tablet, 75 MG, TAKE ONE TABLET BY MOUTH EVERY DAY; DOing well on long actong nitrates.      Notes: No angina.  Trying to avoid invasive testing. No indication for cath at this time.  2. Mixed hyperlipidemia  Continue Pravachol Tablet, 40MG , TAKE ONE TABLET BY MOUTH EVERY DAY Notes: LDL 77 in 2014 October.  Obtain most recent results from Dr. Librarian, academic.  3. Essential hypertension, benign  Off of Diovan Tablet, 160 MG, TAKE ONE TABLET BY  MOUTH EVERY DAY. Now on hydralazine 25 mg TID. Notes: Controlled. Some elevated readings at home.  Most readings have been in the 329J systolic for her at home when she checks it. I've asked her to check at least several times a month.  4. Dyspnea  Continue Lasix Tablet, 40 MG, 2 tablets in the am and 1 tablet in the pm, Orally, as directed Notes: Improved.  Known chronic diastolic heart failure. likely multifactorial due to deconditioning as well.        Chronic renal insufficiency: Valsartan was stopped because of the renal insufficiency. Last creatinine approximately 1.5 and March 2016.  Bradycardia: We will again went over the symptoms of bradycardia including lightheadedness and syncope. She has not had any such episodes. She will watch for these. If he does have any type of lightheadedness, we would have to consider pacemaker. She is on amiodarone at this time to prevent atrial fibrillation. She is quite symptomatic when she goes in atrial fibrillation with diastolic heart failure as well. That is why I am hesitant to cut back on her amiodarone at this point.   Current medicines are reviewed at length with the patient today.  The patient concerns regarding her medicines were addressed.  The following changes have been made:  No change  Labs/ tests ordered today include:  No orders of the defined types were placed in this encounter.    Recommend 150 minutes/week of aerobic exercise Low fat, low carb, high fiber diet recommended  Disposition:   FU in 9 months   Teresita Madura., MD  09/10/2014 9:41 AM    [facility] [address], [place], [place]  [address] Phone: [phone]; Fax: [phone]

## 2014-09-14 DIAGNOSIS — N183 Chronic kidney disease, stage 3 unspecified: Secondary | ICD-10-CM | POA: Insufficient documentation

## 2014-09-14 DIAGNOSIS — R001 Bradycardia, unspecified: Secondary | ICD-10-CM | POA: Insufficient documentation

## 2014-09-17 ENCOUNTER — Other Ambulatory Visit: Payer: Self-pay | Admitting: Interventional Cardiology

## 2014-10-28 NOTE — Patient Outreach (Signed)
[place] [facility]) Care Management  10/28/2014  Laura Zuniga [facility] 20-May-1917 [id number]   Referral from [facility] Tier 4 list, assigned to Laura Laster, RN for patient outreach.  Laura Zuniga [facility] Care Management Assistant [phone]

## 2014-11-09 ENCOUNTER — Other Ambulatory Visit: Payer: Self-pay

## 2014-11-09 NOTE — Patient Outreach (Signed)
[place] [facility]) Care Management  11/09/2014  Exa Bomba [facility] 06-20-1917 [id number]  Telephonic Care Management Note:  Triage  Referral Date: 10/28/14 Referral Source:  [facility] Tier 4 Referral Issue:  DM, COPD, CHF; 3 ED visits and 0 admissions.   Outreach call to patient.  Patient not reached.  RN CM left HIPAA compliant voice message with name and call back #.   Plan: RN CM rescheduled for next outreach call within 1-2 weeks.   Mariann Laster, RN, BSN, [facility], CCM  [employer] Management Coordinator [phone] Office [phone] Direct [phone] Cell

## 2014-11-11 ENCOUNTER — Other Ambulatory Visit: Payer: Commercial Managed Care - HMO

## 2014-11-11 NOTE — Patient Outreach (Signed)
[place] [facility]) Care Management  11/11/2014  Laura Zuniga [facility] 01/04/1918 [id number]   Telephonic Care Management Note: Triage  Referral Date: 10/28/14 Referral Source: [facility] Tier 4 Referral Issue: DM, COPD, CHF; 3 ED visits and 0 admissions.   Triage outreach call #2  to patient. Patient not reached.  RN CM left HIPAA compliant voice message with name and call back #.   Plan: RN CM rescheduled for next outreach call within 1-2 weeks.  Mariann Laster, RN, BSN, [facility], CCM  [employer] Management Coordinator [phone] Office [phone] Direct [phone] Cell

## 2014-11-16 ENCOUNTER — Other Ambulatory Visit: Payer: Commercial Managed Care - HMO

## 2014-11-16 NOTE — Patient Outreach (Signed)
[place] [facility]) Care Management  11/16/2014  Jaima Janney [facility] [birth year] [id number]   Telephonic Care Management Note: Triage  Referral Date: 10/28/14 Referral Source: [facility] Tier 4 Referral Issue: DM, COPD, CHF; 3 ED visits and 0 admissions.   Triage outreach call #3 to patient. Patient not reached.  RN CM left HIPAA compliant voice message with name and call back #.   Plan: RN CM will send unsuccessful outreach letter; review within 10 business days and close case if no response.   Mariann Laster, RN, BSN, [facility], CCM  [employer] Management Coordinator [phone] Direct [phone] Cell [phone] Office [phone] Fax

## 2014-11-30 ENCOUNTER — Other Ambulatory Visit: Payer: Self-pay

## 2014-11-30 NOTE — Patient Outreach (Signed)
[place] [facility]) Care Management  11/30/2014  Laura Zuniga [name] 19-Dec-1917 [id number]  Telephonic Care Management Note: Triage  Referral Date: 10/28/14 Referral Source: [facility] Tier 4 Referral Issue: DM, COPD, CHF; 3 ED visits and 0 admissions.   Triage outreach call #4 to patient. Patient reached.   Social:   Patient states she had admission to [facility] for about a 2-3 month stay.  States last hospital admission was 1 year ago.  States Her daughter/[name] moved in with her about 3 months ago. Falls: 2  Last fall about 3 months ago: Dizziness reaching up while reaching into a kitchen cabinet with injury/ER visit Hematoma to head.  Caregiver:  Daughter/[name] Transportation: Daughter/[name] DME:  Gilford Rile, Rolator with seat/wheels, glucometer & supplies   THN Conditions:  DM, CHF, COPD DM:  BS - states not checking every day but MD has advised patient her DM is stable.  Last Primary Care appt was just a few weeks ago.  A1C:  Patient unable to provide.   CHF: patient weighs daily and states current weight around 140.  States MD advised max weight of 150.  Patient states no issues with CHF and MD office nurse has provided education on CHF mgmt.  COPD:  No issues reported.   Medications  Taking more than 10 medications.  Denies issues getting refills or taking as directed.  States daughter/[name] manages all her medications.    RN CM discussed H/O 3 ED visits over the past 6 months.  07/23/14 - hip pain  07/03/14 - mechanical fall.  10/12/13 - 10/29/13 -  [place] following [place] discharge for CHF RN CM discussed fall prevention.   Consent: Patient refuses [facility] services.  States she does not feel she needs any services at this time.  RN CM advised to please notify MD of any changes in condition prior to scheduled appt's.   RN CM confirmed patient is aware of 911 services for urgent emergency needs.  Plan:   RN CM notified [place] Assistant -  case closed; refused services.  RN CM will notify Primary MD via case closure letter.   Mariann Laster, RN, BSN, [facility], CCM  [employer] Management Coordinator [phone] Direct [phone] Cell [phone] Office [phone] Fax

## 2014-12-01 NOTE — Patient Outreach (Signed)
[place] [facility]) Care Management  12/01/2014  Dorethea Strubel [facility] [birth year] [id number]   Notification from [place] to close case due to patient refusing services.  Trammell Bowden L. Nicoletta Hush, [place] Care Management Assistant

## 2014-12-06 ENCOUNTER — Other Ambulatory Visit: Payer: Self-pay

## 2014-12-06 MED ORDER — ISOSORBIDE MONONITRATE ER 60 MG PO TB24: 30.0000 mg | ORAL_TABLET | Freq: Every day | ORAL | Status: DC

## 2015-01-10 ENCOUNTER — Ambulatory Visit
Admission: RE | Admit: 2015-01-10 | Discharge: 2015-01-10 | Disposition: A | Discharge status: Home / Self Care | Payer: Commercial Managed Care - HMO | Source: Ambulatory Visit | Attending: Internal Medicine | Admitting: Internal Medicine

## 2015-01-10 ENCOUNTER — Other Ambulatory Visit: Payer: Self-pay | Admitting: Internal Medicine

## 2015-01-10 DIAGNOSIS — R0609 Other forms of dyspnea: Principal | ICD-10-CM

## 2015-02-03 ENCOUNTER — Other Ambulatory Visit: Payer: Self-pay | Admitting: Interventional Cardiology

## 2015-02-14 ENCOUNTER — Telehealth: Payer: Self-pay | Admitting: Interventional Cardiology

## 2015-02-14 NOTE — Telephone Encounter (Signed)
Daughter calling stating pt has been taking Imdur 60 mg tablet daily and now the pharmacy is sending Imdur 60 mg taking 1/2 tablet daily. Daughter would like some clarification concerning this medication.please advise

## 2015-02-14 NOTE — Telephone Encounter (Signed)
**Note De-Identified  Obfuscation** I could not reach Laura Zuniga at the number we have listed for her so I left a message on her VM stating that I will call the pt to discuss her Imdur dose.  I called the pt and explained to her that according to her chart it looks like she has always been on Imdur 30 mg daily but that her last refill was sent in for 60 mg tablet with instructions to take 1/2 tablet daily.  She verbalized understanding and thanked me for calling her back.

## 2015-02-15 ENCOUNTER — Telehealth: Payer: Self-pay | Admitting: Interventional Cardiology

## 2015-02-15 NOTE — Telephone Encounter (Signed)
I spoke with pt's daughter [name] with pt's verbal permission.   [name] states pt has been taking Imdur 60mg  daily and not Imdur 1/2 of a 60mg  tablet (30mg ) daily. [name] states pt has been on Imdur 60mg  daily since a least June 2015 even though medication list from office visit with Dr Irish Lack in June 2016 indicates pt taking 1/2 of a 60mg  tablet (30mg ) daily. [name] states last refill for Imdur was sent in for #15 Imdur 60mg  tablets to take 1/2 tablet daily and pt has run out of them early because she has been taking Imdur 60mg  daily.   [name] advised I will forward to Dr Irish Lack for [facility] to continue and refill Imdur for 60mg  daily and not Imdur 60mg  1/2 tablet daily.

## 2015-02-15 NOTE — Telephone Encounter (Signed)
New message     Daughter is calling to see what the nurse told patient about her medications on yesterday.  Patient told daughter she is to cut a pill in half but did not know which one.  Please call

## 2015-02-16 NOTE — Telephone Encounter (Signed)
OK to refill 60 mg Imdur daily.

## 2015-02-18 ENCOUNTER — Other Ambulatory Visit: Payer: Self-pay | Admitting: *Deleted

## 2015-02-18 NOTE — Patient Outreach (Signed)
High Risk Patient screening. I spoke to pt's daughter, [name], with whom the pt lives and is her 69. She informs me her mother have very low vision, mobility issues and multiple health issues. She is deconditioned and probably needs PT. She agreed to allow me to come visit with them next week, 02/23/15 at 10:30 am.  Deloria Lair [facility] [place] [phone]

## 2015-02-21 MED ORDER — ISOSORBIDE MONONITRATE ER 60 MG PO TB24: 60.0000 mg | ORAL_TABLET | Freq: Every day | ORAL | Status: DC

## 2015-02-21 NOTE — Telephone Encounter (Signed)
Left a detailed message on the pts daughter, [name]'s, VM stating that it is ok for the pt to continue to take Imdur at 60 mg daily and that I have sent the refill for increased dose of Imdur to [place] to fill. Also, I left this office's phone number for her to call back if she has any questions.Marland Kitchen

## 2015-02-23 ENCOUNTER — Other Ambulatory Visit: Payer: Self-pay | Admitting: *Deleted

## 2015-02-23 ENCOUNTER — Encounter: Payer: Self-pay | Admitting: *Deleted

## 2015-02-23 MED ORDER — ALBUTEROL SULFATE HFA 108 (90 BASE) MCG/ACT IN AERS: 2.0000 | INHALATION_SPRAY | Freq: Four times a day (QID) | RESPIRATORY_TRACT | Status: AC | PRN

## 2015-02-23 NOTE — Patient Outreach (Signed)
[facility]) Care Management   02/23/2015  Laura Zuniga 12/15/1917 [id number]  Laura Zuniga is an 81 y.o. female  Subjective: Pt referred from Dr. Lysle Zuniga (high risk patient list). Pt resided with her daughter and is fairly independent. She is 81 years old, has a hx of HF and falls. She does not have advanced directives.  Objective:   Review of Systems  Constitutional: Negative.   HENT: Positive for hearing loss.   Eyes: Positive for blurred vision.       Macular degeneration.  Respiratory: Negative.   Cardiovascular: Negative.   Gastrointestinal: Negative.   Genitourinary: Negative.   Musculoskeletal: Positive for falls.  Skin:       Skin discoloration on lower legs due to diminished circulation.  Neurological: Positive for dizziness.  Endo/Heme/Allergies: Bruises/bleeds easily.  Psychiatric/Behavioral: Negative.    BP 164/70 mmHg  Pulse 50  Resp 16  Wt 142 lb (64.411 kg)  SpO2 98%  Physical Exam  Constitutional: She is oriented to person, place, and time. She appears well-developed and well-nourished.  HENT:  Head: Normocephalic and atraumatic.  Cardiovascular: Normal rate and regular rhythm.   Murmur heard. Respiratory: Effort normal and breath sounds normal.  GI: Soft. Bowel sounds are normal.  Musculoskeletal:  Limited ROM.  Neurological: She is alert and oriented to person, place, and time.  Skin: Skin is warm and dry.  Pt has discoloration of lower extremities related to diminished circulation, however her pulses are 2+.  Psychiatric: She has a normal mood and affect.    Current Medications:   Current Outpatient Prescriptions  Medication Sig Dispense Refill  . acetaminophen (TYLENOL) 500 MG tablet Take 500-1,000 mg by mouth every 6 (six) hours as needed for moderate pain or headache.    Marland Kitchen amiodarone (PACERONE) 200 MG tablet TAKE 1 TABLET EVERY DAY 90 tablet 1  . calcium-vitamin D (OSCAL WITH D) 500-200 MG-UNIT per tablet Take 1  tablet by mouth daily after lunch.     . clopidogrel (PLAVIX) 75 MG tablet TAKE ONE TABLET BY MOUTH ONCE DAILY 30 tablet 3  . Cranberry 250 MG CAPS Take 250 mg by mouth daily after lunch.     . cyanocobalamin 500 MCG tablet Take 500 mcg by mouth daily after lunch. Vitamin B12    . escitalopram (LEXAPRO) 10 MG tablet Take 10 mg by mouth daily.     . ferrous sulfate 325 (65 FE) MG tablet Take 325 mg by mouth daily after lunch.     . furosemide (LASIX) 40 MG tablet TAKE TWO TABLETS BY MOUTH IN THE MORNING, THEN TAKE ONE IN THE EVENING, YOU CAN TAKE AN EXTRA 40 MG IN THE EVENING IF NEEDED 90 tablet 6  . hydrALAZINE (APRESOLINE) 25 MG tablet TAKE 1 TABLET EVERY 8 HOURS 180 tablet 1  . isosorbide mononitrate (IMDUR) 60 MG 24 hr tablet Take 1 tablet (60 mg total) by mouth daily. 90 tablet 5  . levothyroxine (SYNTHROID, LEVOTHROID) 50 MCG tablet Take 50 mcg by mouth daily before breakfast.     . Multiple Vitamins-Minerals (HAIR/SKIN/NAILS/BIOTIN) TABS Take 1 tablet by mouth daily after lunch.    . Multiple Vitamins-Minerals (ICAPS PO) Take 1 capsule by mouth 2 (two) times daily.    . pantoprazole (PROTONIX) 40 MG tablet Take 40 mg by mouth daily after lunch.     . polyethylene glycol (MIRALAX / GLYCOLAX) packet Take 17 g by mouth daily as needed (constipation). Mix in 8 oz juice and drink    .  potassium chloride SA (K-DUR,KLOR-CON) 20 MEQ tablet Take 20 mEq by mouth 2 (two) times daily.     . pravastatin (PRAVACHOL) 40 MG tablet Take 40 mg by mouth at bedtime.     . Tetrahydrozoline HCl (VISINE OP) Place 1 drop into both eyes daily as needed (itching).    . vitamin C (ASCORBIC ACID) 500 MG tablet Take 500 mg by mouth daily after lunch.     . vitamin E 400 UNIT capsule Take 400 Units by mouth daily after lunch.    . albuterol (PROVENTIL HFA;VENTOLIN HFA) 108 (90 BASE) MCG/ACT inhaler Inhale 2 puffs into the lungs every 6 (six) hours as needed for wheezing or shortness of breath.    . mupirocin ointment  (BACTROBAN) 2 % Apply 1 application topically daily as needed (wound care).      No current facility-administered medications for this visit.    Functional Status:   In your present state of health, do you have any difficulty performing the following activities: 02/23/2015 11/30/2014  Hearing? Y N  Vision? Y N  Difficulty concentrating or making decisions? N N  Walking or climbing stairs? Y Y  Dressing or bathing? Y N  Doing errands, shopping? Laura Zuniga  Preparing Food and eating ? Y N  Using the Toilet? N N  In the past six months, have you accidently leaked urine? Y N  Do you have problems with loss of bowel control? Y N  Managing your Medications? Y N  Managing your Finances? Y N  Housekeeping or managing your Housekeeping? Laura Zuniga    Fall/Depression Screening:    PHQ 2/9 Scores 02/23/2015  PHQ - 2 Score 1   Fall Risk  02/23/2015 11/30/2014  Falls in the past year? Yes Yes  Number falls in past yr: 2 or more 2 or more  Injury with Fall? Yes Yes  Risk Factor Category  High Fall Risk High Fall Risk  Risk for fall due to : History of fall(s);Impaired balance/gait;Impaired mobility;Impaired vision;Medication side effect History of fall(s);Impaired balance/gait  Follow up Falls evaluation completed Falls prevention discussed    .Assessment:  HF                          High risk for falls - would benefit from PT                          No Advanced Directives    No fluvac  Plan: Daughter to take pt to pharmacy for flu vaccine today.                    Dr. Lysle Zuniga - PLEASE ORDER PT FOR PT FOR GAIT TRAINING AND STRENGTHENING.           I will see pt again in one month.  [facility] CM Care Plan Problem One        Most Recent Value   Care Plan Problem One  HF   Role Documenting the Problem One  Care Management Coordinator   Care Plan for Problem One  Active   THN Long Term Goal (31-90 days)  Pt will not go to the hospital in the next 90 days.   THN Long Term Goal Start Date  02/23/15     [facility] CM Care Plan Problem Two        Most Recent Value   Care Plan Problem Two  High risk for  falls   Role Documenting the Problem Two  Care Management Coordinator   Care Plan for Problem Two  Active   [facility]N CM Short Term Goal #1 (0-30 days)  Pt will not fall in the next 30 days.   THN CM Short Term Goal #1 Start Date  02/23/15   Interventions for Short Term Goal #2   Discussed home safety, will request PT.    [facility] CM Care Plan Problem Three        Most Recent Value   Care Plan Problem Three  No advanced directives.   Role Documenting the Problem Three  Care Management Coordinator   Care Plan for Problem Three  Active   THN CM Short Term Goal #1 (0-30 days)  Pt will complete her HCPOA, Living will and MOST form within the next 30 days.   THN CM Short Term Goal #1 Start Date  02/23/15   Interventions for Short Term Goal #1  Discussed decisions and provided documents.     Deloria Lair [facility] [place] [phone]

## 2015-03-08 ENCOUNTER — Encounter: Payer: Self-pay | Admitting: Interventional Cardiology

## 2015-03-10 ENCOUNTER — Other Ambulatory Visit: Payer: Self-pay | Admitting: Cardiology

## 2015-03-16 ENCOUNTER — Ambulatory Visit (INDEPENDENT_AMBULATORY_CARE_PROVIDER_SITE_OTHER): Payer: Commercial Managed Care - HMO | Admitting: Ophthalmology

## 2015-03-24 ENCOUNTER — Ambulatory Visit: Payer: Self-pay | Admitting: *Deleted

## 2015-03-24 ENCOUNTER — Telehealth: Payer: Self-pay | Admitting: Interventional Cardiology

## 2015-03-24 ENCOUNTER — Other Ambulatory Visit: Payer: Self-pay | Admitting: *Deleted

## 2015-03-24 NOTE — Telephone Encounter (Signed)
No additional note needed 

## 2015-03-24 NOTE — Telephone Encounter (Signed)
New problem   Pt's hr at rest is 40 and haven't had any medications and has some sob. Please call pt.

## 2015-03-24 NOTE — Patient Outreach (Signed)
S:  Pt is doing well and is stable. Her wt is stable, occasional SOB, no edema. She denies any falls. She still need fluvaccine.  O:  BP 100/60 mmHg  Resp 18  Wt 145 lb (65.772 kg)        Afib, bradycardia (40 bpm)       Lungs are clear after a good cough       No edema  A:  HF      Bradycardia  P:  Called Dr. Irish Lack - reported above. Only levothyroxine taken this am, no others yet. Office to call back and advise.       Use walker, continue upper body and lower leg exercises.       Provided a new calendar Environmental education officer.       I will see pt again in a month.  Laura Zuniga [facility] [place] [phone]

## 2015-03-24 NOTE — Telephone Encounter (Addendum)
Returned call to  carroll spinks/THNReport [phone]    Reports that the patients heart rate was 40 today and 42 last week. BP is running 110/50 She is short of breath at times according to family. Nurse says she has no edema or other problems or complaints but was concerned of HR of 40 today. Reviewed previous office visits ans see heart rate documented by MD to be in the upper 40's and has exertional dyspnea. HR documented 09/10/14 of 42 HR documented on 12/10/13 of 47   Routed to Dr. Ayesha Mohair tomorrow morning with Flex, Ellen Henri 0800 Patients daughter is aware

## 2015-03-25 ENCOUNTER — Ambulatory Visit
Admission: RE | Admit: 2015-03-25 | Discharge: 2015-03-25 | Disposition: A | Discharge status: Home / Self Care | Payer: Commercial Managed Care - HMO | Source: Ambulatory Visit | Attending: Physician Assistant | Admitting: Physician Assistant

## 2015-03-25 ENCOUNTER — Ambulatory Visit (INDEPENDENT_AMBULATORY_CARE_PROVIDER_SITE_OTHER): Payer: Commercial Managed Care - HMO | Admitting: Physician Assistant

## 2015-03-25 ENCOUNTER — Encounter: Payer: Self-pay | Admitting: Physician Assistant

## 2015-03-25 ENCOUNTER — Ambulatory Visit: Payer: Commercial Managed Care - HMO | Admitting: Cardiology

## 2015-03-25 VITALS — BP 130/50 | HR 44 | Ht 61.0 in | Wt 147.0 lb

## 2015-03-25 DIAGNOSIS — R0602 Shortness of breath: Secondary | ICD-10-CM | POA: Diagnosis not present

## 2015-03-25 DIAGNOSIS — R42 Dizziness and giddiness: Secondary | ICD-10-CM | POA: Diagnosis not present

## 2015-03-25 DIAGNOSIS — R001 Bradycardia, unspecified: Secondary | ICD-10-CM | POA: Diagnosis not present

## 2015-03-25 LAB — BASIC METABOLIC PANEL
BUN: 24 mg/dL (ref 7–25)
CO2: 32 mmol/L — AB (ref 20–31)
CREATININE: 1.64 mg/dL — AB (ref 0.60–0.88)
Calcium: 9.6 mg/dL (ref 8.6–10.4)
Chloride: 97 mmol/L — ABNORMAL LOW (ref 98–110)
Glucose, Bld: 84 mg/dL (ref 65–99)
Potassium: 5 mmol/L (ref 3.5–5.3)
Sodium: 136 mmol/L (ref 135–146)

## 2015-03-25 LAB — TSH: TSH: 4.217 u[IU]/mL (ref 0.350–4.500)

## 2015-03-25 NOTE — Progress Notes (Signed)
Cardiology Office Note   Date:  03/25/2015   ID:  Laura Zuniga, DOB 1918/04/05, MRN [id number]  PCP:  Wenda Low, MD  Cardiologist:  Dr. Irish Lack   Chief Complaint  Patient presents with  . Bradycardia      History of Present Illness: Laura Zuniga is a 81 y.o. female who presents for evaluation of bradycardia. She has a PMH of HTN, HLD, h/o recurrent c dif infection, intermittent atrial fibrillation and bradycardia. She had markedly abnormal stress test in 2007 consistent with underlying CAD. She did not have cath. Given her advanced age, it was opted for medical therapy. She does have some DOE improved with lasix. 2D echo on 09/28/2013 showed EF 55-60%, mild AI/mildMR, moderately dilated LA, PA peak pressure 10mmHg. She had 48 hour holter monitor in 8/15 with lowest HR 48, occasional junctional rhythm. She was referred today for bradycardia. Note, she is on amiodarone for prevention of atrial fibrillation as she has a lot of symptom. First episode of afib was in Jun 2015 when she was admitted with C diff colitis.   Patient presents today accompanied by her daughter for eval of bradycardia. Per daughter, she did have a fall in the summer when she was looking up. Her last carotid doppler was in 2007 which showed no significant blockage. Per patient, she does not usually has dizziness with walking, she does have imbalance. The only time she has dizziness is when she change body positions in the morning. She gets dizzy for brief moments when she gets up or change to standing position. She has been compliant with her medication.    Past Medical History  Diagnosis Date  . CHF (congestive heart failure) ([place])   . GERD (gastroesophageal reflux disease)   . Hypertension   . Diverticulitis   . UTI (urinary tract infection)   . Blockage of coronary artery of heart ([place]) 2007    never had cardiac cath, CAD dx by positive ant. wall defect on Nuc. study.  no cath due to high risk.  .  Macular degeneration     Past Surgical History  Procedure Laterality Date  . Cholecystectomy    . Appendectomy    . Vaginal hysterectomy    . Eye surgery    . Esophagogastroduodenoscopy N/A 09/22/2013    Procedure: ESOPHAGOGASTRODUODENOSCOPY (EGD);  Surgeon: Lear Ng, MD;  Location: [facility] ENDOSCOPY;  Service: Endoscopy;  Laterality: N/A;  . Flexible sigmoidoscopy N/A 09/22/2013    Procedure: FLEXIBLE SIGMOIDOSCOPY;  Surgeon: Lear Ng, MD;  Location: [place];  Service: Endoscopy;  Laterality: N/A;  . Flexible sigmoidoscopy N/A 09/22/2013    Procedure: FLEXIBLE SIGMOIDOSCOPY;  Surgeon: Lear Ng, MD;  Location: [place];  Service: Endoscopy;  Laterality: N/A;  unprepped/ egd first  . Esophagogastroduodenoscopy N/A 09/22/2013    Procedure: ESOPHAGOGASTRODUODENOSCOPY (EGD);  Surgeon: Lear Ng, MD;  Location: [facility] ENDOSCOPY;  Service: Endoscopy;  Laterality: N/A;     Current Outpatient Prescriptions  Medication Sig Dispense Refill  . acetaminophen (TYLENOL) 500 MG tablet Take 500-1,000 mg by mouth every 6 (six) hours as needed for moderate pain or headache.    . albuterol (PROVENTIL HFA;VENTOLIN HFA) 108 (90 BASE) MCG/ACT inhaler Inhale 2 puffs into the lungs every 6 (six) hours as needed for wheezing or shortness of breath. 1 Inhaler 3  . calcium-vitamin D (OSCAL WITH D) 500-200 MG-UNIT per tablet Take 1 tablet by mouth daily after lunch.     . clopidogrel (PLAVIX) 75 MG  tablet TAKE ONE TABLET BY MOUTH ONCE DAILY 30 tablet 3  . Cranberry 250 MG CAPS Take 250 mg by mouth daily after lunch.     . cyanocobalamin 500 MCG tablet Take 500 mcg by mouth daily after lunch. Vitamin B12    . escitalopram (LEXAPRO) 10 MG tablet Take 10 mg by mouth daily.     . ferrous sulfate 325 (65 FE) MG tablet Take 325 mg by mouth daily after lunch.     . furosemide (LASIX) 40 MG tablet TAKE TWO TABLETS BY MOUTH IN THE MORNING, THEN TAKE ONE IN THE EVENING, YOU CAN TAKE AN  EXTRA 40 MG IN THE EVENING IF NEEDED 90 tablet 6  . hydrALAZINE (APRESOLINE) 25 MG tablet TAKE ONE TABLET BY MOUTH EVERY 8 HOURS 90 tablet 4  . isosorbide mononitrate (IMDUR) 60 MG 24 hr tablet Take 1 tablet (60 mg total) by mouth daily. 90 tablet 5  . levothyroxine (SYNTHROID, LEVOTHROID) 50 MCG tablet Take 50 mcg by mouth daily before breakfast.     . Multiple Vitamins-Minerals (HAIR/SKIN/NAILS/BIOTIN) TABS Take 1 tablet by mouth daily after lunch.    . pantoprazole (PROTONIX) 40 MG tablet Take 40 mg by mouth daily after lunch.     . polyethylene glycol (MIRALAX / GLYCOLAX) packet Take 17 g by mouth daily as needed (constipation). Mix in 8 oz juice and drink    . potassium chloride SA (K-DUR,KLOR-CON) 20 MEQ tablet Take 20 mEq by mouth 2 (two) times daily.     . pravastatin (PRAVACHOL) 40 MG tablet Take 40 mg by mouth at bedtime.     . Tetrahydrozoline HCl (VISINE OP) Place 1 drop into both eyes daily as needed (itching).    . vitamin C (ASCORBIC ACID) 500 MG tablet Take 500 mg by mouth daily after lunch.     . vitamin E 400 UNIT capsule Take 400 Units by mouth daily after lunch.     No current facility-administered medications for this visit.    Allergies:   Penicillins and Sulfonamide derivatives    Social History:  The patient  reports that she has never smoked. She has never used smokeless tobacco. She reports that she does not drink alcohol or use illicit drugs.   Family History:  The patient's family history includes COPD in her brother and sister; Cancer in her brother, brother, brother, sister, and sister; Cirrhosis in her brother; Heart attack in her sister; Heart attack (age of onset: 26) in her father; Heart disease in her brother; Hypertension in her daughter. There is no history of Stroke.    ROS:  Please see the history of present illness.   Otherwise, review of systems are positive for dizziness in the morning, increasing DOE, and imbalance, abdominal pain.   All other  systems are reviewed and negative.    PHYSICAL EXAM: VS:  BP 130/50 mmHg  Pulse 44  Ht 5\' 1"  (1.549 m)  Wt 147 lb (66.679 kg)  BMI 27.79 kg/m2 , BMI Body mass index is 27.79 kg/(m^2). GEN: Well nourished, well developed, in no acute distress HEENT: normal Neck: no JVD, carotid bruits, or masses Cardiac: irregular; no murmurs, rubs, or gallops,no edema  Respiratory:  clear to auscultation bilaterally, normal work of breathing GI: soft, nontender, nondistended, + BS MS: no deformity or atrophy Skin: warm and dry, no rash Neuro:  Strength and sensation are intact Psych: euthymic mood, full affect   EKG:  EKG is ordered today. The ekg ordered today demonstrates slow atrial  fibrillation   Recent Labs: 07/03/2014: ALT 24; BUN 19; Creatinine, Ser 1.48*; Hemoglobin 13.0; Platelets 183; Potassium 3.5; Sodium 139    Lipid Panel    Component Value Date/Time   CHOL 143 06/28/2011 2230   TRIG 177* 06/28/2011 2230   HDL 38* 06/28/2011 2230   CHOLHDL 3.8 06/28/2011 2230   VLDL 35 06/28/2011 2230   LDLCALC 70 06/28/2011 2230      Wt Readings from Last 3 Encounters:  03/25/15 147 lb (66.679 kg)  03/24/15 145 lb (65.772 kg)  02/23/15 142 lb (64.411 kg)      Other studies Reviewed: Additional studies/ records that were reviewed today include: previous admission note in 2015, medications. Review of the above records demonstrates: had PAF with RVR in 2015 when she was admitted with c diff colitis. Previous holter monitor showed bradycardia with intermittent junctional rhythm   ASSESSMENT AND PLAN:  1.  Dizziness with positional changes: sounds orthostatic hypotension. Advise raise head of the bed and drink glass of water in AM to rehydrate herself  - orthostatic vital sign obtained, her SBP did not drop as much as expected. Changing from sitting to standing SBP went down from 158 --> 148  2. Bradycardia: chronic, no longer on rate control medication. Today's EKG concerning for  slow afib. Case discussed and EKG reviewed with Dr. Caryl Comes, given the fact her original afib with RVR occur in Jun 2015 when she was admitted with C diff colitis. It is worth to try to take her off amiodarone at this time and put her on 24 hour holter monitor in 4 weeks (after washout) to make sure her HR is not jumping too fast. If she continue to have tachy-brady symptom, will consider PPM at some point  3. PAF  - CHADS2DS2-Vasc score 4 (age, female, HTN). Not on rate control medication due to baseline bradycardia, only medication that can slow down HR is amiodarone, discussed with Dr. Caryl Comes, will stop amiodarone for now and see her HR response.   - not on systemic anticoagulation given need for plavix and risk for bleeding given advanced age  30. Progressive SOB: does have rale in RLL, no obvious edema. Will repeat BMET. Take additional dose lasix x 2 days. Obtain CXR. Likely related to her age as well  5. Recurrent abdominal pain: instructed patient to see PCP if worsen given her history of recurrent C diff colitis  6. HTN: controlled BP 130/50 7. HLD: on pravastatin   Current medicines are reviewed at length with the patient today.  The patient does not have concerns regarding medicines.  The following changes have been made:  Stop amiodarone  Labs/ tests ordered today include:   Orders Placed This Encounter  Procedures  . DG Chest 2 View  . Basic Metabolic Panel (BMET)  . TSH  . Holter monitor - 24 hour  . EKG 12-Lead     Disposition:   FU with Dr. Irish Lack or his APP in 4 weeks after holter monitor  Signed, Almyra Deforest PA 03/25/2015 9:52 AM    [address], [place], [place]  [address] Phone: [phone]; Fax: [phone]

## 2015-03-25 NOTE — Patient Instructions (Signed)
Medication Instructions:  Your physician has recommended you make the following change in your medication:  1. Take Additional dose of Lasix ( 80 mg ) twice a day for two days than go back to previous dose ( 80 mg ) in the am and ( 40 mg ) in the pm   Labwork: Your physician recommends that you have lab work today: bmet/tsh   Testing/Procedures: Your physician has recommended that you wear a holter monitor. Holter monitors are medical devices that record the heart's electrical activity. Doctors most often use these monitors to diagnose arrhythmias. Arrhythmias are problems with the speed or rhythm of the heartbeat. The monitor is a small, portable device. You can wear one while you do your normal daily activities. This is usually used to diagnose what is causing palpitations/syncope (passing out).  A chest x-ray takes a picture of the organs and structures inside the chest, including the heart, lungs, and blood vessels. This test can show several things, including, whether the heart is enlarges; whether fluid is building up in the lungs; and whether pacemaker / defibrillator leads are still in place.   Follow-Up: Your physician recommends that you keep your scheduled follow-up appointment with Richardson Dopp, PA-C    Any Other Special Instructions Will Be Listed Below (If Applicable).  See Primary Care Doctor regarding abdominal pain with your history of C-Diff.  Raise head out of bed, sitting on side of bed drink a glass of water before you get out of bed.    If you need a refill on your cardiac medications before your next appointment, please call your pharmacy.

## 2015-03-29 ENCOUNTER — Telehealth: Payer: Self-pay | Admitting: *Deleted

## 2015-03-29 ENCOUNTER — Other Ambulatory Visit: Payer: Self-pay | Admitting: Physician Assistant

## 2015-03-29 DIAGNOSIS — N183 Chronic kidney disease, stage 3 (moderate): Secondary | ICD-10-CM

## 2015-03-29 NOTE — Telephone Encounter (Signed)
-----   Message from [place], [place] sent at 03/29/2015  9:00 AM EST ----- Thyroid test stable. Kidney function went down slightly, recommend recheck BMET on followup visit in 1 month. No fasting needed. I will place order for BMET

## 2015-03-29 NOTE — Telephone Encounter (Signed)
Called pt to let her know that her chest xray was normal.  She verbalized understanding.

## 2015-03-29 NOTE — Telephone Encounter (Signed)
-----   Message from [place], [place] sent at 03/29/2015  8:56 AM EST ----- Chest x ray is normal, no acute process was seen

## 2015-03-29 NOTE — Telephone Encounter (Signed)
Spoke with pt's daughter, Laura Zuniga, and she has been made aware of pt's lab results.  The pt is having a heart monitor put on 04/25/15, and they will have BMET repeated at that time.  Order in [place].

## 2015-03-30 ENCOUNTER — Ambulatory Visit (INDEPENDENT_AMBULATORY_CARE_PROVIDER_SITE_OTHER): Payer: Commercial Managed Care - HMO | Admitting: Ophthalmology

## 2015-04-13 DIAGNOSIS — Z23 Encounter for immunization: Secondary | ICD-10-CM | POA: Diagnosis not present

## 2015-04-14 ENCOUNTER — Ambulatory Visit: Payer: Commercial Managed Care - HMO | Admitting: Physician Assistant

## 2015-04-25 ENCOUNTER — Ambulatory Visit (INDEPENDENT_AMBULATORY_CARE_PROVIDER_SITE_OTHER): Payer: PPO

## 2015-04-25 DIAGNOSIS — R0602 Shortness of breath: Secondary | ICD-10-CM | POA: Diagnosis not present

## 2015-04-25 DIAGNOSIS — R001 Bradycardia, unspecified: Secondary | ICD-10-CM

## 2015-04-28 ENCOUNTER — Telehealth: Payer: Self-pay | Admitting: Interventional Cardiology

## 2015-04-28 ENCOUNTER — Other Ambulatory Visit: Payer: Self-pay | Admitting: *Deleted

## 2015-04-28 NOTE — Telephone Encounter (Signed)
Will forward note to Dr Irish Lack as Juluis Rainier

## 2015-04-28 NOTE — Patient Outreach (Signed)
[facility]) Care Management  04/28/2015  Hugh Garrow Meryle Ready 12-10-1917 [id number]   S:  Daughter, [name], reports pt had holter monitor reading done but they have not heard about the results. Dr. Irish Lack did stop her amiodarone. She is to follow up with him again next week.  Pt did receive a fluzone high dose vaccine earlier this month.  She is not weighing every day.   She continues to do her daily exercises.  O:  BP 122/54 mmHg  Pulse 42  Resp 16  Wt 150 lb (68.04 kg)  SpO2 97%        RRR - Bradycardic       Lungs are clear       No pedal edema.  A:  Wt gain of 5# since last visit - no CHF sxs.       Stable CHF       Bradycardia  P:  Gave new HF Low sodium diet guide and Nondiscrimination information.       Reinforced early MD contact for any problems.       I will see pt again in a month.  [facility] CM Care Plan Problem One        Most Recent Value   Care Plan Problem One  HF   Role Documenting the Problem One  Care Management Coordinator   Care Plan for Problem One  Active   THN Long Term Goal (31-90 days)  Pt will not go to the hospital in the next 90 days.   THN Long Term Goal Start Date  02/23/15    [facility] CM Care Plan Problem Two        Most Recent Value   Care Plan Problem Two  High risk for falls   Role Documenting the Problem Two  Care Management Coordinator   Care Plan for Problem Two  Active   [facility]N CM Short Term Goal #1 (0-30 days)  Pt will not fall in the next 30 days.   THN CM Short Term Goal #1 Start Date  02/23/15   [facility] CM Short Term Goal #1 Met Date   03/24/15   Interventions for Short Term Goal #2   Discussed home safety, will request PT.    [facility] CM Care Plan Problem Three        Most Recent Value   Care Plan Problem Three  No advanced directives.   Role Documenting the Problem Three  Care Management Coordinator   Care Plan for Problem Three  Active   [facility]N CM Short Term Goal #1 (0-30 days)  Pt will complete her HCPOA, Living will  and MOST form within the next 30 days.   [facility] CM Short Term Goal #1 Start Date  02/23/15   Interventions for Short Term Goal #1  Reinforced pt and daughters to help pt get her documents completed.     Deloria Lair [facility] [place] [phone]

## 2015-04-28 NOTE — Telephone Encounter (Signed)
**Note De-Identified  Obfuscation** Hassan Rowan, the pts daughter is advised that the pts 24 hour Holter monitor results have not become available yet. She is advised that I will call with results when available. She verbalized understanding.

## 2015-04-28 NOTE — Telephone Encounter (Signed)
pts dtr calling to get results of 24 hour heart monitor from Monday-her other sister has her concerned about it-pls call [phone]

## 2015-05-04 NOTE — Progress Notes (Signed)
Cardiology Office Note:    Date:  05/05/2015   ID:  Laura Zuniga, DOB [birth year], MRN [id number]  PCP:  Laura Low, MD  Cardiologist:  Dr. Casandra Zuniga   Electrophysiologist:  n/a  Chief Complaint  Patient presents with  . Bradycardia    Follow up    History of Present Illness:    Laura Zuniga is a 82 y.o. female with a hx of presumed CAD (anterior wall abnormality on nuclear imaging in [id number]), diastolic CHF, atrial fibrillation, COPD, HTN.  She spends part of her year in [place] and part in [place].  Patient was admitted 6/15 with C. Difficile colitis complicated by atrial fibrillation with RVR. She was placed on amiodarone. She was not felt to be a good candidate for anticoagulation. She converted to NSR. She was then readmitted 6/16 with acute on chronic diastolic CHF. She was diuresed with IV Lasix and d/c back to SNF. FU Holter in 8/15 with NSR, occ junctional rhythm, lowest HR 48; No AFib.    Last seen in clinic by Laura Deforest, PA-C 12/16 to evaluate bradycardia.  She was back in AF with SVR (HR in 40s).  She did have orthostatic intol but Ortho VS were ok.  Case was reviewed with Dr. Virl Zuniga and she was taken off of Amiodarone.  Holter monitor was placed NSR, sinus brady, junctional rhythm and longest pause 2.4 seconds.    She returns for FU.  She is here with her daughter who lives in [place].  She notes that the patient's exercise tolerance is diminished since she last saw her.  The patient is more short of breath with her activities. She has days where she feels sluggish.  She denies syncope.  She has episodes of dizziness that is more related to standing.  She denies orthopnea. She has noted some chest discomfort related to stress.  She notes a non-productive cough. No fevers.     Past Medical History  Diagnosis Date  . Chronic diastolic CHF (congestive heart failure) ([place])     a. Echo 6/15 - Mild LVH, EF 55-60%, mild AI, mild MR, moderate LAE, mild RAE, PASP 52 mm Hg    . GERD (gastroesophageal reflux disease)   . Hypertension   . Diverticulitis   . CAD (coronary artery disease)     never had cardiac cath, CAD dx by positive ant. wall defect on Nuc. study 2007 >>  no cath due to high risk/advanced age  . Macular degeneration   . PAF (paroxysmal atrial fibrillation) ([facility])     not optimal candidate for anticoagulation  . COPD (chronic obstructive pulmonary disease) ([place])   . C. difficile colitis 2015  . Bradycardia     a. Amiodarone DC'd 12/16 >> b. Holter 1/17 - NSR, sinus brady, junctional rhythm, pause 2.4 s  . CKD (chronic kidney disease)     Past Surgical History  Procedure Laterality Date  . Cholecystectomy    . Appendectomy    . Vaginal hysterectomy    . Eye surgery    . Esophagogastroduodenoscopy N/A 09/22/2013    Procedure: ESOPHAGOGASTRODUODENOSCOPY (EGD);  Surgeon: Laura Ng, MD;  Location[facility] ENDOSCOPY;  Service: Endoscopy;  Laterality: N/A;  . Flexible sigmoidoscopy N/A 09/22/2013    Procedure: FLEXIBLE SIGMOIDOSCOPY;  Surgeon: Laura Ng, MD;  Location: [place];  Service: Endoscopy;  Laterality: N/A;  . Flexible sigmoidoscopy N/A 09/22/2013    Procedure: FLEXIBLE SIGMOIDOSCOPY;  Surgeon: Laura Ng, MD;  Location: [place];  Service: Endoscopy;  Laterality: N/A;  unprepped/ egd first  . Esophagogastroduodenoscopy N/A 09/22/2013    Procedure: ESOPHAGOGASTRODUODENOSCOPY (EGD);  Surgeon: Laura Ng, MD;  Location: [facility] ENDOSCOPY;  Service: Endoscopy;  Laterality: N/A;    Current Medications: Outpatient Prescriptions Prior to Visit  Medication Sig Dispense Refill  . acetaminophen (TYLENOL) 500 MG tablet Take 500-1,000 mg by mouth every 6 (six) hours as needed for moderate pain or headache.    . albuterol (PROVENTIL HFA;VENTOLIN HFA) 108 (90 BASE) MCG/ACT inhaler Inhale 2 puffs into the lungs every 6 (six) hours as needed for wheezing or shortness of breath. 1 Inhaler 3  . calcium-vitamin D (OSCAL  WITH D) 500-200 MG-UNIT per tablet Take 1 tablet by mouth daily after lunch.     . clopidogrel (PLAVIX) 75 MG tablet TAKE ONE TABLET BY MOUTH ONCE DAILY 30 tablet 3  . Cranberry 250 MG CAPS Take 250 mg by mouth daily after lunch.     . cyanocobalamin 500 MCG tablet Take 500 mcg by mouth daily after lunch. Vitamin B12    . escitalopram (LEXAPRO) 10 MG tablet Take 10 mg by mouth daily.     . ferrous sulfate 325 (65 FE) MG tablet Take 325 mg by mouth daily after lunch.     . furosemide (LASIX) 40 MG tablet TAKE TWO TABLETS BY MOUTH IN THE MORNING, THEN TAKE ONE IN THE EVENING, YOU CAN TAKE AN EXTRA 40 MG IN THE EVENING IF NEEDED 90 tablet 6  . hydrALAZINE (APRESOLINE) 25 MG tablet TAKE ONE TABLET BY MOUTH EVERY 8 HOURS 90 tablet 4  . isosorbide mononitrate (IMDUR) 60 MG 24 hr tablet Take 1 tablet (60 mg total) by mouth daily. 90 tablet 5  . levothyroxine (SYNTHROID, LEVOTHROID) 50 MCG tablet Take 50 mcg by mouth daily before breakfast.     . Multiple Vitamins-Minerals (HAIR/SKIN/NAILS/BIOTIN) TABS Take 1 tablet by mouth daily after lunch.    . pantoprazole (PROTONIX) 40 MG tablet Take 40 mg by mouth daily after lunch.     . polyethylene glycol (MIRALAX / GLYCOLAX) packet Take 17 g by mouth daily as needed (constipation). Mix in 8 oz juice and drink    . potassium chloride SA (K-DUR,KLOR-CON) 20 MEQ tablet Take 20 mEq by mouth 2 (two) times daily.     . pravastatin (PRAVACHOL) 40 MG tablet Take 40 mg by mouth at bedtime.     . Tetrahydrozoline HCl (VISINE OP) Place 1 drop into both eyes daily as needed (itching).    . vitamin C (ASCORBIC ACID) 500 MG tablet Take 500 mg by mouth daily after lunch.     . vitamin E 400 UNIT capsule Take 400 Units by mouth daily after lunch.    . Dietary Management Product (TOZAL) CAPS Take 3 capsules by mouth daily. Reported on 05/05/2015     No facility-administered medications prior to visit.     Allergies:   Penicillins; 2,4-d dimethylamine (amisol); Ace  inhibitors; and Sulfonamide derivatives   Social History   Social History  . Marital Status: Widowed    Spouse Name: N/A  . Number of Children: N/A  . Years of Education: N/A   Social History Main Topics  . Smoking status: Never Smoker   . Smokeless tobacco: Never Used  . Alcohol Use: No  . Drug Use: No  . Sexual Activity: No   Other Topics Concern  . None   Social History Narrative        father died age 46: Massive  MI         mother died age 91: gallbladder surgery complications         4 brothers: all deceased; prostate ca, throat ca, emphesema, MI, cirrhosis from ETOH         3 sisters: one living: MI, emphesema, 2 sisters with breast CA     Family History:  The patient's family history includes COPD in her brother and sister; Cancer in her brother, brother, brother, sister, and sister; Cirrhosis in her brother; Heart attack in her sister; Heart attack (age of onset: 81) in her father; Heart disease in her brother; Hypertension in her daughter. There is no history of Stroke.   ROS:   Please see the history of present illness.    Review of Systems  Constitution: Positive for malaise/fatigue.  Cardiovascular: Positive for dyspnea on exertion.  Respiratory: Positive for cough and shortness of breath.   Neurological: Positive for loss of balance.  All other systems reviewed and are negative.   Physical Exam:    VS:  BP 160/58 mmHg  Pulse 48  Ht 5\' 1"  (1.549 m)  Wt 147 lb 12.8 oz (67.042 kg)  BMI 27.94 kg/m2   GEN: Well nourished, well developed, in no acute distress HEENT: normal Neck: no JVD, no masses Cardiac: Normal S1/S2, RRR; [id number] systolic murmur RUSB, trace bilat LE edema   Respiratory:  Faint crackles at the bases bilaterally R > L; no wheezing, rhonchi   GI: soft, nontender,  MS: no deformity or atrophy Skin: warm and dry, no rash Neuro:   no focal deficits  Psych: Alert and oriented x 3, normal affect  Wt Readings from Last 3 Encounters:  05/05/15  147 lb 12.8 oz (67.042 kg)  04/28/15 150 lb (68.04 kg)  03/25/15 147 lb (66.679 kg)      Studies/Labs Reviewed:    EKG:  EKG is  ordered today.  The ekg ordered today demonstrates Junctional rhythm, HR 47  Recent Labs: 07/03/2014: ALT 24; Hemoglobin 13.0; Platelets 183 03/25/2015: BUN 24; Creat 1.64*; Potassium 5.0; Sodium 136; TSH 4.217   Recent Lipid Panel    Component Value Date/Time   CHOL 143 06/28/2011 2230   TRIG 177* 06/28/2011 2230   HDL 38* 06/28/2011 2230   CHOLHDL 3.8 06/28/2011 2230   VLDL 35 06/28/2011 2230   LDLCALC 70 06/28/2011 2230    Additional studies/ records that were reviewed today include:   Holter 04/25/15  NSR, sinus bradycardia, junctional rhythm.  Longest pause 2.4 sec. No symptoms reported. If the patient is having lightheadedness or syncope, would refer to EP.   Echo (09/28/13):  Mild LVH, EF 55-60%, mild AI, mild MR, moderate LAE, mild RAE, PASP 52 mm Hg   ASSESSMENT:    1. Bradycardia   2. Chronic diastolic heart failure ([place])   3. PAF (paroxysmal atrial fibrillation) ([place])   4. Coronary artery disease involving native coronary artery of native heart without angina pectoris   5. Essential hypertension   6. CKD (chronic kidney disease), stage 3 (moderate)     PLAN:    In order of problems listed above:  1. Bradycardia - Patient is in junctional brady today.  She has decreased exercise tolerance, dyspnea with activity and occasional dizziness.  She appears to be symptomatic with her bradycardia.  She has been off of Amiodarone since 12/16.  She seems to be a reasonable candidate for PPM.  Dr. Casandra Zuniga agrees.  I d/w Dr. Cristopher Peru who agreed to  see her today at 1:30 pm (her daughter wanted to be here for appt and she leaves this weekend to return to [facility]).    2. Chronic diastolic heart failure: She likely is somewhat volume overloaded as well.  Increase Lasix to 80 mg in A and 40 mg in P x 2 days.  Obtain BMET today.   3. PAF -  Junctional brady today.  She is not a candidate for anticoagulation.  4. CAD: Continue medical therapy with Plavix, nitrates.   5. HYPERTENSION -  Borderline elevated. Continue to monitor for now.   6. CKD - Repeat BMET today.     Medication Adjustments/Labs and Tests Ordered: Current medicines are reviewed at length with the patient today.  Concerns regarding medicines are outlined above.  Medication changes, Labs and Tests ordered today are outlined in the Patient Instructions noted below. Patient Instructions  Medication Instructions:  1. INCREASE LASIX TO 80 MG IN THE MORNING AND 40 MG IN THE PM DO THIS FOR 2 DAYS THEN RESUME YOUR REGULAR DOSE; IF AFTER YOU RESUME YOUR REGULAR DOSE OF LASIX AND FEEL SHORT OF BREATH AGAIN OR MORE SWELLING; PLEASE CALL THE OFFICE [phone] TO LET DOCTOR KNOW.  Labwork: TODAY BMET  Testing/Procedures: NONE  Follow-Up: YOU HAVE AN APPT TODAY TO SEE DR. Lovena Le @ 1:30 05/05/15  Any Other Special Instructions Will Be Listed Below (If Applicable).  If you need a refill on your cardiac medications before your next appointment, please call your pharmacy.       Signed, Richardson Dopp, PA-C  05/05/2015 1:31 PM    [facility] [address], [place], [place]  [address] Phone: [phone]; Fax: [phone]

## 2015-05-05 ENCOUNTER — Ambulatory Visit (INDEPENDENT_AMBULATORY_CARE_PROVIDER_SITE_OTHER): Payer: PPO | Admitting: Physician Assistant

## 2015-05-05 ENCOUNTER — Encounter: Payer: Self-pay | Admitting: Internal Medicine

## 2015-05-05 ENCOUNTER — Ambulatory Visit (INDEPENDENT_AMBULATORY_CARE_PROVIDER_SITE_OTHER): Payer: PPO | Admitting: Internal Medicine

## 2015-05-05 ENCOUNTER — Encounter: Payer: Self-pay | Admitting: Physician Assistant

## 2015-05-05 ENCOUNTER — Ambulatory Visit: Payer: PPO | Admitting: Internal Medicine

## 2015-05-05 VITALS — BP 160/58 | HR 48 | Ht 61.0 in | Wt 147.8 lb

## 2015-05-05 VITALS — BP 168/82 | HR 72 | Ht 61.0 in | Wt 147.0 lb

## 2015-05-05 DIAGNOSIS — I2510 Atherosclerotic heart disease of native coronary artery without angina pectoris: Secondary | ICD-10-CM

## 2015-05-05 DIAGNOSIS — I5032 Chronic diastolic (congestive) heart failure: Secondary | ICD-10-CM

## 2015-05-05 DIAGNOSIS — R001 Bradycardia, unspecified: Secondary | ICD-10-CM | POA: Diagnosis not present

## 2015-05-05 DIAGNOSIS — I480 Paroxysmal atrial fibrillation: Secondary | ICD-10-CM

## 2015-05-05 DIAGNOSIS — I495 Sick sinus syndrome: Secondary | ICD-10-CM | POA: Diagnosis not present

## 2015-05-05 DIAGNOSIS — N183 Chronic kidney disease, stage 3 (moderate): Secondary | ICD-10-CM | POA: Diagnosis not present

## 2015-05-05 DIAGNOSIS — R55 Syncope and collapse: Secondary | ICD-10-CM | POA: Diagnosis not present

## 2015-05-05 DIAGNOSIS — I10 Essential (primary) hypertension: Secondary | ICD-10-CM

## 2015-05-05 LAB — BASIC METABOLIC PANEL
BUN: 21 mg/dL (ref 7–25)
CALCIUM: 9.5 mg/dL (ref 8.6–10.4)
CO2: 28 mmol/L (ref 20–31)
Chloride: 99 mmol/L (ref 98–110)
Creat: 1.34 mg/dL — ABNORMAL HIGH (ref 0.60–0.88)
GLUCOSE: 115 mg/dL — AB (ref 65–99)
Potassium: 3.8 mmol/L (ref 3.5–5.3)
Sodium: 138 mmol/L (ref 135–146)

## 2015-05-05 NOTE — Patient Instructions (Signed)
Medication Instructions:  1. INCREASE LASIX TO 80 MG IN THE MORNING AND 40 MG IN THE PM DO THIS FOR 2 DAYS THEN RESUME YOUR REGULAR DOSE; IF AFTER YOU RESUME YOUR REGULAR DOSE OF LASIX AND FEEL SHORT OF BREATH AGAIN OR MORE SWELLING; PLEASE CALL THE OFFICE [phone] TO LET DOCTOR KNOW.  Labwork: TODAY BMET  Testing/Procedures: NONE  Follow-Up: YOU HAVE AN APPT TODAY TO SEE DR. Lovena Le @ 1:30 05/05/15  Any Other Special Instructions Will Be Listed Below (If Applicable).  If you need a refill on your cardiac medications before your next appointment, please call your pharmacy.

## 2015-05-05 NOTE — Assessment & Plan Note (Addendum)
She is maintaining NSR. Will follow. 

## 2015-05-05 NOTE — Assessment & Plan Note (Signed)
We discussed the pro's and con's of PPM insertion including the risks/benefits/goals/expectations. She and her daughter will consider there options and will call us if they would like to proceed with PPM insertion.

## 2015-05-05 NOTE — Assessment & Plan Note (Signed)
She has had this remotely. Now it appears that she is falling though this could be syncope.

## 2015-05-05 NOTE — Patient Instructions (Signed)
Medication Instructions:  Your physician recommends that you continue on your current medications as directed. Please refer to the Current Medication list given to you today.   Labwork: None ordered   Testing/Procedures: Your physician has recommended that you have a pacemaker inserted. A pacemaker is a small device that is placed under the skin of your chest or abdomen to help control abnormal heart rhythms. This device uses electrical pulses to prompt the heart to beat at a normal rate. Pacemakers are used to treat heart rhythms that are too slow. Wire (leads) are attached to the pacemaker that goes into the chambers of you heart. This is done in the hospital and usually requires and overnight stay. Please see the instruction sheet given to you today for more information.    Follow-Up: Your physician recommends that you schedule a follow-up appointment as needed   Call if you decide to proceed with pacemaker.  Dates open are: 2/7, 2/14, 2/15,2/20,2/28,3/3,3/6,3/8,3/15,3/16,3/20,3/21,3/23,3/24,3/27,3/28   Any Other Special Instructions Will Be Listed Below (If Applicable).     If you need a refill on your cardiac medications before your next appointment, please call your pharmacy.

## 2015-05-05 NOTE — Progress Notes (Signed)
HPI Laura Zuniga is referred today by Richardson Dopp for evaluation of sinus node dysfunction and sob. She has also had several falls. No obvious syncope. She is active and mostly takes care of herself despite her advanced age. Her younger daughter now lives with her however. SHe was seen in our office earlier today by Mr. Kathlen Mody and was found to have junctional rhythm with a resting HR of 45/min.  Allergies  Allergen Reactions  . Penicillins Other (See Comments)    Unknown allergic reaction  . 2,4-D Dimethylamine (Amisol) Other (See Comments)  . Ace Inhibitors Other (See Comments)  . Sulfonamide Derivatives Swelling and Rash     Current Outpatient Prescriptions  Medication Sig Dispense Refill  . acetaminophen (TYLENOL) 500 MG tablet Take 500-1,000 mg by mouth every 6 (six) hours as needed for moderate pain or headache.    . albuterol (PROVENTIL HFA;VENTOLIN HFA) 108 (90 BASE) MCG/ACT inhaler Inhale 2 puffs into the lungs every 6 (six) hours as needed for wheezing or shortness of breath. 1 Inhaler 3  . calcium-vitamin D (OSCAL WITH D) 500-200 MG-UNIT per tablet Take 1 tablet by mouth daily after lunch.     . clopidogrel (PLAVIX) 75 MG tablet TAKE ONE TABLET BY MOUTH ONCE DAILY 30 tablet 3  . Cranberry 250 MG CAPS Take 250 mg by mouth daily after lunch.     . cyanocobalamin 500 MCG tablet Take 500 mcg by mouth daily after lunch. Vitamin B12    . escitalopram (LEXAPRO) 10 MG tablet Take 10 mg by mouth daily.     . ferrous sulfate 325 (65 FE) MG tablet Take 325 mg by mouth daily after lunch.     . furosemide (LASIX) 40 MG tablet TAKE TWO TABLETS BY MOUTH IN THE MORNING, THEN TAKE ONE IN THE EVENING, YOU CAN TAKE AN EXTRA 40 MG IN THE EVENING IF NEEDED 90 tablet 6  . hydrALAZINE (APRESOLINE) 25 MG tablet TAKE ONE TABLET BY MOUTH EVERY 8 HOURS 90 tablet 4  . isosorbide mononitrate (IMDUR) 60 MG 24 hr tablet Take 1 tablet (60 mg total) by mouth daily. 90 tablet 5  . levothyroxine  (SYNTHROID, LEVOTHROID) 50 MCG tablet Take 50 mcg by mouth daily before breakfast.     . Multiple Vitamins-Minerals (HAIR/SKIN/NAILS/BIOTIN) TABS Take 1 tablet by mouth daily after lunch.    . pantoprazole (PROTONIX) 40 MG tablet Take 40 mg by mouth daily after lunch.     . polyethylene glycol (MIRALAX / GLYCOLAX) packet Take 17 g by mouth daily as needed (constipation). Mix in 8 oz juice and drink    . potassium chloride SA (K-DUR,KLOR-CON) 20 MEQ tablet Take 20 mEq by mouth 2 (two) times daily.     . pravastatin (PRAVACHOL) 40 MG tablet Take 40 mg by mouth at bedtime.     . Tetrahydrozoline HCl (VISINE OP) Place 1 drop into both eyes daily as needed (itching).    . vitamin C (ASCORBIC ACID) 500 MG tablet Take 500 mg by mouth daily after lunch.     . vitamin E 400 UNIT capsule Take 400 Units by mouth daily after lunch.     No current facility-administered medications for this visit.     Past Medical History  Diagnosis Date  . Chronic diastolic CHF (congestive heart failure) ([place])     a. Echo 6/15 - Mild LVH, EF 55-60%, mild AI, mild MR, moderate LAE, mild RAE, PASP 52 mm Hg  . GERD (gastroesophageal reflux disease)   .  Hypertension   . Diverticulitis   . CAD (coronary artery disease)     never had cardiac cath, CAD dx by positive ant. wall defect on Nuc. study 2007 >>  no cath due to high risk/advanced age  . Macular degeneration   . PAF (paroxysmal atrial fibrillation) ([facility])     not optimal candidate for anticoagulation  . COPD (chronic obstructive pulmonary disease) ([place])   . C. difficile colitis 2015  . Bradycardia     a. Amiodarone DC'd 12/16 >> b. Holter 1/17 - NSR, sinus brady, junctional rhythm, pause 2.4 s  . CKD (chronic kidney disease)     ROS:   All systems reviewed and negative except as noted in the HPI.   Past Surgical History  Procedure Laterality Date  . Cholecystectomy    . Appendectomy    . Vaginal hysterectomy    . Eye surgery    .  Esophagogastroduodenoscopy N/A 09/22/2013    Procedure: ESOPHAGOGASTRODUODENOSCOPY (EGD);  Surgeon: Lear Ng, MD;  Location: [facility] ENDOSCOPY;  Service: Endoscopy;  Laterality: N/A;  . Flexible sigmoidoscopy N/A 09/22/2013    Procedure: FLEXIBLE SIGMOIDOSCOPY;  Surgeon: Lear Ng, MD;  Location: [place];  Service: Endoscopy;  Laterality: N/A;  . Flexible sigmoidoscopy N/A 09/22/2013    Procedure: FLEXIBLE SIGMOIDOSCOPY;  Surgeon: Lear Ng, MD;  Location: [place];  Service: Endoscopy;  Laterality: N/A;  unprepped/ egd first  . Esophagogastroduodenoscopy N/A 09/22/2013    Procedure: ESOPHAGOGASTRODUODENOSCOPY (EGD);  Surgeon: Lear Ng, MD;  Location: [facility] ENDOSCOPY;  Service: Endoscopy;  Laterality: N/A;     Family History  Problem Relation Age of Onset  . Heart attack Father 47  . Heart attack Sister   . Cancer Brother   . Cancer Brother   . Cirrhosis Brother   . Heart disease Brother   . Cancer Brother   . COPD Brother   . COPD Sister   . Cancer Sister   . Cancer Sister   . Hypertension Daughter   . Stroke Neg Hx      Social History   Social History  . Marital Status: Widowed    Spouse Name: N/A  . Number of Children: N/A  . Years of Education: N/A   Occupational History  . Not on file.   Social History Main Topics  . Smoking status: Never Smoker   . Smokeless tobacco: Never Used  . Alcohol Use: No  . Drug Use: No  . Sexual Activity: No   Other Topics Concern  . Not on file   Social History Narrative        father died age 56: Massive MI         mother died age 16: gallbladder surgery complications         4 brothers: all deceased; prostate ca, throat ca, emphesema, MI, cirrhosis from ETOH         3 sisters: one living: MI, emphesema, 2 sisters with breast CA     BP 168/82 mmHg  Pulse 72  Ht 5\' 1"  (1.549 m)  Wt 147 lb (66.679 kg)  BMI 27.79 kg/m2  SpO2 96%  Physical Exam:  Well appearing NAD HEENT:  Unremarkable Neck:  No JVD, no thyromegally Lymphatics:  No adenopathy Back:  No CVA tenderness Lungs:  Clear with no wheezes HEART:  Regular brady rhythm, no murmurs, no rubs, no clicks Abd:  soft, positive bowel sounds, no organomegally, no rebound, no guarding Ext:  2 plus pulses, no edema,  no cyanosis, no clubbing Skin:  No rashes no nodules Neuro:  CN II through XII intact, motor grossly intact  EKG - junctional rhythm at 45/min.  Assess/Plan:

## 2015-05-09 ENCOUNTER — Encounter: Payer: Self-pay | Admitting: *Deleted

## 2015-05-09 ENCOUNTER — Telehealth: Payer: Self-pay | Admitting: Internal Medicine

## 2015-05-09 NOTE — Telephone Encounter (Signed)
New Message:  Pt's daughter is calling in to speak with you about scheduling and appt to have the procedure to have a pacemaker placed. Please call back

## 2015-05-09 NOTE — Telephone Encounter (Signed)
Called and spoke with daughter.  I have scheduled the procedure for 05/17/15 at 11:30am.  She is to check in at the [place] at 9:30am.  Aware to be NPO after midnight.  She will call back if things change

## 2015-05-16 MED ORDER — VANCOMYCIN HCL IN DEXTROSE 1-5 GM/200ML-% IV SOLN
1000.0000 mg | INTRAVENOUS | Status: AC
Start: 2015-05-16 — End: 2015-05-17
  Administered 2015-05-17: 1000 mg via INTRAVENOUS
  Filled 2015-05-16 (×2): qty 200

## 2015-05-16 MED ORDER — SODIUM CHLORIDE 0.9 % IR SOLN
80.0000 mg | Status: DC
  Filled 2015-05-16: qty 2

## 2015-05-17 ENCOUNTER — Encounter (HOSPITAL_COMMUNITY): Payer: Self-pay | Admitting: Internal Medicine

## 2015-05-17 ENCOUNTER — Encounter (HOSPITAL_COMMUNITY)
Admission: RE | Disposition: A | Discharge status: Home / Self Care | Payer: Self-pay | Source: Ambulatory Visit | Attending: Internal Medicine

## 2015-05-17 ENCOUNTER — Ambulatory Visit (HOSPITAL_COMMUNITY)
Admission: RE | Admit: 2015-05-17 | Discharge: 2015-05-18 | Disposition: A | Discharge status: Home / Self Care | Payer: PPO | Source: Ambulatory Visit | Attending: Internal Medicine | Admitting: Internal Medicine

## 2015-05-17 DIAGNOSIS — N189 Chronic kidney disease, unspecified: Secondary | ICD-10-CM | POA: Insufficient documentation

## 2015-05-17 DIAGNOSIS — Z882 Allergy status to sulfonamides status: Secondary | ICD-10-CM | POA: Insufficient documentation

## 2015-05-17 DIAGNOSIS — Z880 Allergy status to penicillin: Secondary | ICD-10-CM | POA: Insufficient documentation

## 2015-05-17 DIAGNOSIS — J449 Chronic obstructive pulmonary disease, unspecified: Secondary | ICD-10-CM | POA: Insufficient documentation

## 2015-05-17 DIAGNOSIS — I5032 Chronic diastolic (congestive) heart failure: Secondary | ICD-10-CM | POA: Insufficient documentation

## 2015-05-17 DIAGNOSIS — K219 Gastro-esophageal reflux disease without esophagitis: Secondary | ICD-10-CM | POA: Insufficient documentation

## 2015-05-17 DIAGNOSIS — H3530 Unspecified macular degeneration: Secondary | ICD-10-CM | POA: Diagnosis not present

## 2015-05-17 DIAGNOSIS — I130 Hypertensive heart and chronic kidney disease with heart failure and stage 1 through stage 4 chronic kidney disease, or unspecified chronic kidney disease: Secondary | ICD-10-CM | POA: Insufficient documentation

## 2015-05-17 DIAGNOSIS — Z8249 Family history of ischemic heart disease and other diseases of the circulatory system: Secondary | ICD-10-CM | POA: Diagnosis not present

## 2015-05-17 DIAGNOSIS — Z7902 Long term (current) use of antithrombotics/antiplatelets: Secondary | ICD-10-CM | POA: Insufficient documentation

## 2015-05-17 DIAGNOSIS — Z950 Presence of cardiac pacemaker: Secondary | ICD-10-CM

## 2015-05-17 DIAGNOSIS — I2510 Atherosclerotic heart disease of native coronary artery without angina pectoris: Secondary | ICD-10-CM | POA: Insufficient documentation

## 2015-05-17 DIAGNOSIS — I495 Sick sinus syndrome: Secondary | ICD-10-CM | POA: Diagnosis not present

## 2015-05-17 DIAGNOSIS — I480 Paroxysmal atrial fibrillation: Secondary | ICD-10-CM | POA: Insufficient documentation

## 2015-05-17 HISTORY — PX: EP IMPLANTABLE DEVICE: SHX172B

## 2015-05-17 LAB — SURGICAL PCR SCREEN
MRSA, PCR: NEGATIVE
Staphylococcus aureus: NEGATIVE

## 2015-05-17 LAB — CBC
HEMATOCRIT: 41.5 % (ref 36.0–46.0)
HEMOGLOBIN: 13.9 g/dL (ref 12.0–15.0)
MCH: 33.3 pg (ref 26.0–34.0)
MCHC: 33.5 g/dL (ref 30.0–36.0)
MCV: 99.5 fL (ref 78.0–100.0)
Platelets: 192 10*3/uL (ref 150–400)
RBC: 4.17 MIL/uL (ref 3.87–5.11)
RDW: 12.4 % (ref 11.5–15.5)
WBC: 6.4 10*3/uL (ref 4.0–10.5)

## 2015-05-17 SURGERY — PACEMAKER IMPLANT

## 2015-05-17 MED ORDER — CALCIUM CARBONATE-VITAMIN D 500-200 MG-UNIT PO TABS
1.0000 | ORAL_TABLET | Freq: Every day | ORAL | Status: DC
  Administered 2015-05-17: 1 via ORAL
  Filled 2015-05-17: qty 1

## 2015-05-17 MED ORDER — PANTOPRAZOLE SODIUM 40 MG PO TBEC
40.0000 mg | DELAYED_RELEASE_TABLET | Freq: Every day | ORAL | Status: DC
  Administered 2015-05-17: 40 mg via ORAL
  Filled 2015-05-17: qty 1

## 2015-05-17 MED ORDER — ACETAMINOPHEN 500 MG PO TABS
500.0000 mg | ORAL_TABLET | Freq: Four times a day (QID) | ORAL | Status: DC | PRN
  Administered 2015-05-17: 1000 mg via ORAL
  Filled 2015-05-17: qty 2

## 2015-05-17 MED ORDER — LIDOCAINE HCL (PF) 1 % IJ SOLN
INTRAMUSCULAR | Status: AC
  Filled 2015-05-17: qty 30

## 2015-05-17 MED ORDER — TOZAL PO CAPS: 1.0000 | ORAL_CAPSULE | Freq: Two times a day (BID) | ORAL | Status: DC

## 2015-05-17 MED ORDER — FENTANYL CITRATE (PF) 100 MCG/2ML IJ SOLN
INTRAMUSCULAR | Status: AC
  Filled 2015-05-17: qty 2

## 2015-05-17 MED ORDER — MUPIROCIN 2 % EX OINT
1.0000 "application " | TOPICAL_OINTMENT | Freq: Once | CUTANEOUS | Status: AC
  Administered 2015-05-17: 1 via TOPICAL

## 2015-05-17 MED ORDER — ALBUTEROL SULFATE (2.5 MG/3ML) 0.083% IN NEBU: 2.5000 mg | INHALATION_SOLUTION | Freq: Four times a day (QID) | RESPIRATORY_TRACT | Status: DC | PRN

## 2015-05-17 MED ORDER — POLYETHYLENE GLYCOL 3350 17 G PO PACK: 17.0000 g | PACK | Freq: Every day | ORAL | Status: DC | PRN

## 2015-05-17 MED ORDER — LIDOCAINE HCL (PF) 1 % IJ SOLN
INTRAMUSCULAR | Status: DC | PRN
  Administered 2015-05-17: 24 mL via SUBCUTANEOUS

## 2015-05-17 MED ORDER — PRAVASTATIN SODIUM 40 MG PO TABS
40.0000 mg | ORAL_TABLET | Freq: Every day | ORAL | Status: DC
  Administered 2015-05-17: 40 mg via ORAL
  Filled 2015-05-17: qty 1

## 2015-05-17 MED ORDER — HEPARIN SODIUM (PORCINE) 1000 UNIT/ML IJ SOLN
INTRAMUSCULAR | Status: DC | PRN
  Administered 2015-05-17: 1000 [IU] via INTRAVENOUS

## 2015-05-17 MED ORDER — FENTANYL CITRATE (PF) 100 MCG/2ML IJ SOLN
INTRAMUSCULAR | Status: DC | PRN
  Administered 2015-05-17 (×2): 12.5 ug via INTRAVENOUS

## 2015-05-17 MED ORDER — ACETAMINOPHEN 325 MG PO TABS: 325.0000 mg | ORAL_TABLET | ORAL | Status: DC | PRN

## 2015-05-17 MED ORDER — FERROUS SULFATE 325 (65 FE) MG PO TABS
325.0000 mg | ORAL_TABLET | Freq: Every day | ORAL | Status: DC
  Administered 2015-05-17: 325 mg via ORAL
  Filled 2015-05-17: qty 1

## 2015-05-17 MED ORDER — PANTOPRAZOLE SODIUM 40 MG PO TBEC: 40.0000 mg | DELAYED_RELEASE_TABLET | Freq: Every day | ORAL | Status: DC

## 2015-05-17 MED ORDER — ISOSORBIDE MONONITRATE ER 60 MG PO TB24
60.0000 mg | ORAL_TABLET | Freq: Every day | ORAL | Status: DC
  Administered 2015-05-17 – 2015-05-18 (×2): 60 mg via ORAL
  Filled 2015-05-17 (×2): qty 1

## 2015-05-17 MED ORDER — VITAMIN E 180 MG (400 UNIT) PO CAPS
400.0000 [IU] | ORAL_CAPSULE | Freq: Every day | ORAL | Status: DC
  Filled 2015-05-17 (×2): qty 1

## 2015-05-17 MED ORDER — VANCOMYCIN HCL IN DEXTROSE 750-5 MG/150ML-% IV SOLN
750.0000 mg | Freq: Two times a day (BID) | INTRAVENOUS | Status: AC
  Administered 2015-05-17: 750 mg via INTRAVENOUS
  Filled 2015-05-17: qty 150

## 2015-05-17 MED ORDER — VITAMIN C 500 MG PO TABS: 500.0000 mg | ORAL_TABLET | Freq: Every day | ORAL | Status: DC

## 2015-05-17 MED ORDER — SODIUM CHLORIDE 0.9 % IR SOLN
Status: AC
  Filled 2015-05-17: qty 2

## 2015-05-17 MED ORDER — CALCIUM CARBONATE-VITAMIN D 500-200 MG-UNIT PO TABS: 1.0000 | ORAL_TABLET | Freq: Every day | ORAL | Status: DC

## 2015-05-17 MED ORDER — CHLORHEXIDINE GLUCONATE 4 % EX LIQD: 60.0000 mL | Freq: Once | CUTANEOUS | Status: DC

## 2015-05-17 MED ORDER — HYDRALAZINE HCL 25 MG PO TABS
25.0000 mg | ORAL_TABLET | Freq: Two times a day (BID) | ORAL | Status: DC
  Administered 2015-05-18: 25 mg via ORAL
  Filled 2015-05-17 (×2): qty 1

## 2015-05-17 MED ORDER — MIDAZOLAM HCL 5 MG/5ML IJ SOLN
INTRAMUSCULAR | Status: DC | PRN
  Administered 2015-05-17 (×2): 1 mg via INTRAVENOUS

## 2015-05-17 MED ORDER — LEVOTHYROXINE SODIUM 50 MCG PO TABS
50.0000 ug | ORAL_TABLET | Freq: Every day | ORAL | Status: DC
  Administered 2015-05-18: 50 ug via ORAL
  Filled 2015-05-17: qty 1

## 2015-05-17 MED ORDER — HEPARIN (PORCINE) IN NACL 2-0.9 UNIT/ML-% IJ SOLN
INTRAMUSCULAR | Status: AC
  Filled 2015-05-17: qty 500

## 2015-05-17 MED ORDER — VITAMIN C 500 MG PO TABS
500.0000 mg | ORAL_TABLET | Freq: Every day | ORAL | Status: DC
  Administered 2015-05-17: 500 mg via ORAL
  Filled 2015-05-17: qty 1

## 2015-05-17 MED ORDER — MIDAZOLAM HCL 5 MG/5ML IJ SOLN
INTRAMUSCULAR | Status: AC
  Filled 2015-05-17: qty 5

## 2015-05-17 MED ORDER — CLOPIDOGREL BISULFATE 75 MG PO TABS
75.0000 mg | ORAL_TABLET | Freq: Every day | ORAL | Status: DC
  Administered 2015-05-17 – 2015-05-18 (×2): 75 mg via ORAL
  Filled 2015-05-17 (×2): qty 1

## 2015-05-17 MED ORDER — HAIR/SKIN/NAILS/BIOTIN PO TABS: 1.0000 | ORAL_TABLET | Freq: Every day | ORAL | Status: DC

## 2015-05-17 MED ORDER — MUPIROCIN 2 % EX OINT
TOPICAL_OINTMENT | CUTANEOUS | Status: AC
Start: 2015-05-17 — End: 2015-05-17
  Administered 2015-05-17: 1 via TOPICAL
  Filled 2015-05-17: qty 22

## 2015-05-17 MED ORDER — CRANBERRY 250 MG PO CAPS: 250.0000 mg | ORAL_CAPSULE | Freq: Every day | ORAL | Status: DC

## 2015-05-17 MED ORDER — ONDANSETRON HCL 4 MG/2ML IJ SOLN: 4.0000 mg | Freq: Four times a day (QID) | INTRAMUSCULAR | Status: DC | PRN

## 2015-05-17 MED ORDER — ESCITALOPRAM OXALATE 10 MG PO TABS
10.0000 mg | ORAL_TABLET | Freq: Every day | ORAL | Status: DC
  Administered 2015-05-17 – 2015-05-18 (×2): 10 mg via ORAL
  Filled 2015-05-17 (×2): qty 1

## 2015-05-17 MED ORDER — POTASSIUM CHLORIDE CRYS ER 20 MEQ PO TBCR
20.0000 meq | EXTENDED_RELEASE_TABLET | Freq: Two times a day (BID) | ORAL | Status: DC
Start: 2015-05-17 — End: 2015-05-18
  Administered 2015-05-17 – 2015-05-18 (×2): 20 meq via ORAL
  Filled 2015-05-17 (×2): qty 1

## 2015-05-17 MED ORDER — SODIUM CHLORIDE 0.9 % IV SOLN
INTRAVENOUS | Status: DC
  Administered 2015-05-17: 10:00:00 via INTRAVENOUS

## 2015-05-17 MED ORDER — SODIUM CHLORIDE 0.9 % IR SOLN
Status: DC | PRN
  Administered 2015-05-17: 13:00:00

## 2015-05-17 MED ORDER — FERROUS SULFATE 325 (65 FE) MG PO TABS: 325.0000 mg | ORAL_TABLET | Freq: Every day | ORAL | Status: DC

## 2015-05-17 MED ORDER — SODIUM CHLORIDE 0.9 % IV SOLN
INTRAVENOUS | Status: DC | PRN
  Administered 2015-05-17: 50 mL/h via INTRAVENOUS

## 2015-05-17 SURGICAL SUPPLY — 7 items
CABLE SURGICAL S-101-97-12 (CABLE) ×3 IMPLANT
LEAD TENDRIL SDX 2088TC-46CM (Lead) ×3 IMPLANT
LEAD TENDRIL SDX 2088TC-52CM (Lead) ×3 IMPLANT
PAD DEFIB LIFELINK (PAD) ×3 IMPLANT
PPM ASSURITY DR PM2240 (Pacemaker) ×3 IMPLANT
SHEATH CLASSIC 7F (SHEATH) ×6 IMPLANT
TRAY PACEMAKER INSERTION (PACKS) ×3 IMPLANT

## 2015-05-17 NOTE — Interval H&P Note (Signed)
History and Physical Interval Note:  05/17/2015 9:26 AM  Laura Zuniga  has presented today for surgery, with the diagnosis of afib  The various methods of treatment have been discussed with the patient and family. After consideration of risks, benefits and other options for treatment, the patient has consented to  Procedure(s): Pacemaker Implant (N/A) as a surgical intervention .  The patient's history has been reviewed, patient examined, no change in status, stable for surgery.  I have reviewed the patient's chart and labs.  Questions were answered to the patient's satisfaction.     Mikle Bosworth.D.

## 2015-05-17 NOTE — H&P (View-Only) (Signed)
HPI Ms. Lares is referred today by Richardson Dopp for evaluation of sinus node dysfunction and sob. She has also had several falls. No obvious syncope. She is active and mostly takes care of herself despite her advanced age. Her younger daughter now lives with her however. SHe was seen in our office earlier today by Mr. Kathlen Mody and was found to have junctional rhythm with a resting HR of 45/min.  Allergies  Allergen Reactions  . Penicillins Other (See Comments)    Unknown allergic reaction  . 2,4-D Dimethylamine (Amisol) Other (See Comments)  . Ace Inhibitors Other (See Comments)  . Sulfonamide Derivatives Swelling and Rash     Current Outpatient Prescriptions  Medication Sig Dispense Refill  . acetaminophen (TYLENOL) 500 MG tablet Take 500-1,000 mg by mouth every 6 (six) hours as needed for moderate pain or headache.    . albuterol (PROVENTIL HFA;VENTOLIN HFA) 108 (90 BASE) MCG/ACT inhaler Inhale 2 puffs into the lungs every 6 (six) hours as needed for wheezing or shortness of breath. 1 Inhaler 3  . calcium-vitamin D (OSCAL WITH D) 500-200 MG-UNIT per tablet Take 1 tablet by mouth daily after lunch.     . clopidogrel (PLAVIX) 75 MG tablet TAKE ONE TABLET BY MOUTH ONCE DAILY 30 tablet 3  . Cranberry 250 MG CAPS Take 250 mg by mouth daily after lunch.     . cyanocobalamin 500 MCG tablet Take 500 mcg by mouth daily after lunch. Vitamin B12    . escitalopram (LEXAPRO) 10 MG tablet Take 10 mg by mouth daily.     . ferrous sulfate 325 (65 FE) MG tablet Take 325 mg by mouth daily after lunch.     . furosemide (LASIX) 40 MG tablet TAKE TWO TABLETS BY MOUTH IN THE MORNING, THEN TAKE ONE IN THE EVENING, YOU CAN TAKE AN EXTRA 40 MG IN THE EVENING IF NEEDED 90 tablet 6  . hydrALAZINE (APRESOLINE) 25 MG tablet TAKE ONE TABLET BY MOUTH EVERY 8 HOURS 90 tablet 4  . isosorbide mononitrate (IMDUR) 60 MG 24 hr tablet Take 1 tablet (60 mg total) by mouth daily. 90 tablet 5  . levothyroxine  (SYNTHROID, LEVOTHROID) 50 MCG tablet Take 50 mcg by mouth daily before breakfast.     . Multiple Vitamins-Minerals (HAIR/SKIN/NAILS/BIOTIN) TABS Take 1 tablet by mouth daily after lunch.    . pantoprazole (PROTONIX) 40 MG tablet Take 40 mg by mouth daily after lunch.     . polyethylene glycol (MIRALAX / GLYCOLAX) packet Take 17 g by mouth daily as needed (constipation). Mix in 8 oz juice and drink    . potassium chloride SA (K-DUR,KLOR-CON) 20 MEQ tablet Take 20 mEq by mouth 2 (two) times daily.     . pravastatin (PRAVACHOL) 40 MG tablet Take 40 mg by mouth at bedtime.     . Tetrahydrozoline HCl (VISINE OP) Place 1 drop into both eyes daily as needed (itching).    . vitamin C (ASCORBIC ACID) 500 MG tablet Take 500 mg by mouth daily after lunch.     . vitamin E 400 UNIT capsule Take 400 Units by mouth daily after lunch.     No current facility-administered medications for this visit.     Past Medical History  Diagnosis Date  . Chronic diastolic CHF (congestive heart failure) ([place])     a. Echo 6/15 - Mild LVH, EF 55-60%, mild AI, mild MR, moderate LAE, mild RAE, PASP 52 mm Hg  . GERD (gastroesophageal reflux disease)   .  Hypertension   . Diverticulitis   . CAD (coronary artery disease)     never had cardiac cath, CAD dx by positive ant. wall defect on Nuc. study 2007 >>  no cath due to high risk/advanced age  . Macular degeneration   . PAF (paroxysmal atrial fibrillation) ([facility])     not optimal candidate for anticoagulation  . COPD (chronic obstructive pulmonary disease) ([place])   . C. difficile colitis 2015  . Bradycardia     a. Amiodarone DC'd 12/16 >> b. Holter 1/17 - NSR, sinus brady, junctional rhythm, pause 2.4 s  . CKD (chronic kidney disease)     ROS:   All systems reviewed and negative except as noted in the HPI.   Past Surgical History  Procedure Laterality Date  . Cholecystectomy    . Appendectomy    . Vaginal hysterectomy    . Eye surgery    .  Esophagogastroduodenoscopy N/A 09/22/2013    Procedure: ESOPHAGOGASTRODUODENOSCOPY (EGD);  Surgeon: Lear Ng, MD;  Location: [facility] ENDOSCOPY;  Service: Endoscopy;  Laterality: N/A;  . Flexible sigmoidoscopy N/A 09/22/2013    Procedure: FLEXIBLE SIGMOIDOSCOPY;  Surgeon: Lear Ng, MD;  Location: [place];  Service: Endoscopy;  Laterality: N/A;  . Flexible sigmoidoscopy N/A 09/22/2013    Procedure: FLEXIBLE SIGMOIDOSCOPY;  Surgeon: Lear Ng, MD;  Location: [place];  Service: Endoscopy;  Laterality: N/A;  unprepped/ egd first  . Esophagogastroduodenoscopy N/A 09/22/2013    Procedure: ESOPHAGOGASTRODUODENOSCOPY (EGD);  Surgeon: Lear Ng, MD;  Location: [facility] ENDOSCOPY;  Service: Endoscopy;  Laterality: N/A;     Family History  Problem Relation Age of Onset  . Heart attack Father 51  . Heart attack Sister   . Cancer Brother   . Cancer Brother   . Cirrhosis Brother   . Heart disease Brother   . Cancer Brother   . COPD Brother   . COPD Sister   . Cancer Sister   . Cancer Sister   . Hypertension Daughter   . Stroke Neg Hx      Social History   Social History  . Marital Status: Widowed    Spouse Name: N/A  . Number of Children: N/A  . Years of Education: N/A   Occupational History  . Not on file.   Social History Main Topics  . Smoking status: Never Smoker   . Smokeless tobacco: Never Used  . Alcohol Use: No  . Drug Use: No  . Sexual Activity: No   Other Topics Concern  . Not on file   Social History Narrative        father died age 64: [place] MI         mother died age 29: gallbladder surgery complications         4 brothers: all deceased; prostate ca, throat ca, emphesema, MI, cirrhosis from ETOH         3 sisters: one living: MI, emphesema, 2 sisters with breast CA     BP 168/82 mmHg  Pulse 72  Ht 5\' 1"  (1.549 m)  Wt 147 lb (66.679 kg)  BMI 27.79 kg/m2  SpO2 96%  Physical Exam:  Well appearing NAD HEENT:  Unremarkable Neck:  No JVD, no thyromegally Lymphatics:  No adenopathy Back:  No CVA tenderness Lungs:  Clear with no wheezes HEART:  Regular brady rhythm, no murmurs, no rubs, no clicks Abd:  soft, positive bowel sounds, no organomegally, no rebound, no guarding Ext:  2 plus pulses, no edema,  no cyanosis, no clubbing Skin:  No rashes no nodules Neuro:  CN II through XII intact, motor grossly intact  EKG - junctional rhythm at 45/min.  Assess/Plan:

## 2015-05-17 NOTE — Plan of Care (Signed)
Problem: Phase II Progression Outcomes Goal: Pacer site without bleeding/hematoma Outcome: Progressing Assess site routine with VS and PRN Goal: No evidence of pacemaker malfunction Outcome: Progressing Assess for arrhythmias Routine with VS Goal: Hemodynamically stable Outcome: Progressing Assess VS Routine.   Problem: Phase III Progression Outcomes Goal: Pain controlled on oral analgesia Outcome: Progressing Assess Pain Routine per shift and PRN Goal: Tolerating diet Outcome: Progressing Assess Intake and Output Routine per Shift and PRN

## 2015-05-17 NOTE — Progress Notes (Signed)
PHARMACIST - PHYSICIAN ORDER COMMUNICATION  CONCERNING: P&T Medication Policy on Herbal Medications  DESCRIPTION:  This patient's order for:  Cranberry, Hair/Skin/Nails vitamins and [place] dietary management product  has been noted.  This product(s) is classified as an "herbal" or natural product. Due to a lack of definitive safety studies or FDA approval, nonstandard manufacturing practices, plus the potential risk of unknown drug-drug interactions while on inpatient medications, the Pharmacy and Therapeutics Committee does not permit the use of "herbal" or natural products of this type within [facility].   ACTION TAKEN: The pharmacy department is unable to verify this order at this time and your patient has been informed of this safety policy. Please reevaluate patient's clinical condition at discharge and address if the herbal or natural product(s) should be resumed at that time.  Reatha Harps, Pharm.D., BCPS Clinical Pharmacist

## 2015-05-18 ENCOUNTER — Ambulatory Visit (HOSPITAL_COMMUNITY): Payer: PPO

## 2015-05-18 DIAGNOSIS — I130 Hypertensive heart and chronic kidney disease with heart failure and stage 1 through stage 4 chronic kidney disease, or unspecified chronic kidney disease: Secondary | ICD-10-CM | POA: Diagnosis not present

## 2015-05-18 DIAGNOSIS — I495 Sick sinus syndrome: Secondary | ICD-10-CM | POA: Diagnosis not present

## 2015-05-18 DIAGNOSIS — Z950 Presence of cardiac pacemaker: Secondary | ICD-10-CM | POA: Diagnosis not present

## 2015-05-18 DIAGNOSIS — N189 Chronic kidney disease, unspecified: Secondary | ICD-10-CM | POA: Diagnosis not present

## 2015-05-18 DIAGNOSIS — I5032 Chronic diastolic (congestive) heart failure: Secondary | ICD-10-CM | POA: Diagnosis not present

## 2015-05-18 MED ORDER — AMIODARONE HCL 100 MG PO TABS: 100.0000 mg | ORAL_TABLET | Freq: Every day | ORAL | Status: DC

## 2015-05-18 MED FILL — Lidocaine HCl Local Preservative Free (PF) Inj 1%: INTRAMUSCULAR | Qty: 30 | Status: AC

## 2015-05-18 MED FILL — Heparin Sodium (Porcine) 2 Unit/ML in Sodium Chloride 0.9%: INTRAMUSCULAR | Qty: 500 | Status: AC

## 2015-05-18 NOTE — Consult Note (Signed)
   [facility] CM Inpatient Consult   05/18/2015  Laura Zuniga [facility] 1917/08/17 [id number] Active with [facility] Care Management prior to admission. Came by to see the patient and she already was discharge.  Will notify community nurse of her discharge home. For questions, please contact: Natividad Brood, RN BSN [facility] Liaison  [phone] business mobile phone Toll free office [phone]

## 2015-05-18 NOTE — Progress Notes (Signed)
Discharge order received. Discharged instructions given and reviewed with patient's daughter.  Patient discharging home with daughter.

## 2015-05-18 NOTE — Discharge Summary (Signed)
ELECTROPHYSIOLOGY PROCEDURE DISCHARGE SUMMARY    Patient ID: Laura Zuniga,  MRN: [id number], DOB/AGE: May 28, 1917 82 y.o.  Admit date: 05/17/2015 Discharge date: 05/18/2015  Primary Care Physician: Wenda Low, MD Primary Cardiologist: Irish Lack Electrophysiologist: Lovena Le  Primary Discharge Diagnosis:  Symptomatic bradycardia status post pacemaker implantation this admission  Secondary Discharge Diagnosis:  1.  Chronic diastolic heart failure 2.  GERD 3.  Hypertension 4.  Paroxysmal atrial fibrillation 5.  COPD   Allergies  Allergen Reactions  . Penicillins Other (See Comments)    Unknown allergic reaction  . 2,4-D Dimethylamine (Amisol) Other (See Comments)  . Ace Inhibitors Other (See Comments)  . Lanolin Hives  . Sulfonamide Derivatives Swelling and Rash     Procedures This Admission:  1.  Implantation of a STJ dual chamber PPM on 05/17/15 by Dr Lovena Le. See op note for full details.There were no immediate post procedure complications. 2.  CXR on 05/18/15 demonstrated no pneumothorax status post device implantation.   Brief HPI: Laura Zuniga is a 82 y.o. female with a past medical history as outlined above. She had been maintained on amiodarone for PAF but was found to be bradycardic and this was discontinued. She continued to have exercise intolerance and fatigue and was found to have junctional rhythm.  The patient has had symptomatic bradycardia without reversible causes identified.  Risks, benefits, and alternatives to PPM implantation were reviewed with the patient who wished to proceed.   Hospital Course:  The patient was admitted and underwent implantation of a STJ dual chamber pacemaker with details as outlined above.  She  was monitored on telemetry overnight which demonstrated atrial pacing with intrinsic ventricular conduction.  Left chest was without hematoma or ecchymosis.  The device was interrogated and found to be functioning normally.  CXR was  obtained and demonstrated no pneumothorax status post device implantation.  Wound care, arm mobility, and restrictions were reviewed with the patient.  The patient was examined and considered stable for discharge to home.   Amiodarone will be resumed at 100mg  daily at discharge. She is felt to be a poor candidate for [facility] with advanced age and falls.  CHADS2VASC is 4.   Physical Exam: Filed Vitals:   05/17/15 2100 05/17/15 2200 05/17/15 2300 05/18/15 0446  BP: 118/44 102/52 119/51   Pulse: 60 59 60   Temp: 97.8 F (36.6 C)   97.8 F (36.6 C)  TempSrc: Oral   Oral  Resp: 22 17 16    Height:      Weight:    146 lb 11.2 oz (66.543 kg)  SpO2: 95% 92% 92%     GEN- The patient is elderly appearing, alert and oriented x 3 today.   HEENT: normocephalic, atraumatic; sclera clear, conjunctiva pink; hearing intact; oropharynx clear; neck supple  Lungs- Clear to ausculation bilaterally, normal work of breathing.  No wheezes, rales, rhonchi Heart- Regular rate and rhythm  GI- soft, non-tender, non-distended, bowel sounds present  Extremities- no clubbing, cyanosis, or edema  MS- no significant deformity or atrophy Skin- warm and dry, no rash or lesion, left chest without hematoma/ecchymosis Psych- euthymic mood, full affect Neuro- strength and sensation are intact   Labs:   Lab Results  Component Value Date   WBC 6.4 05/17/2015   HGB 13.9 05/17/2015   HCT 41.5 05/17/2015   MCV 99.5 05/17/2015   PLT 192 05/17/2015   No results for input(s): NA, K, CL, CO2, BUN, CREATININE, CALCIUM, PROT, BILITOT, ALKPHOS, ALT, AST, GLUCOSE in  the last 168 hours.  Invalid input(s): LABALBU  Discharge Medications:    Medication List    TAKE these medications        acetaminophen 500 MG tablet  Commonly known as:  TYLENOL  Take 500-1,000 mg by mouth every 6 (six) hours as needed for moderate pain or headache.     albuterol 108 (90 Base) MCG/ACT inhaler  Commonly known as:  PROVENTIL HFA;VENTOLIN  HFA  Inhale 2 puffs into the lungs every 6 (six) hours as needed for wheezing or shortness of breath.     amiodarone 100 MG tablet  Commonly known as:  PACERONE  Take 1 tablet (100 mg total) by mouth daily.     calcium-vitamin D 500-200 MG-UNIT tablet  Commonly known as:  OSCAL WITH D  Take 1 tablet by mouth daily after lunch.     clopidogrel 75 MG tablet  Commonly known as:  PLAVIX  TAKE ONE TABLET BY MOUTH ONCE DAILY     Cranberry 250 MG Caps  Take 250 mg by mouth daily after lunch.     escitalopram 10 MG tablet  Commonly known as:  LEXAPRO  Take 10 mg by mouth daily.     ferrous sulfate 325 (65 FE) MG tablet  Take 325 mg by mouth daily after lunch.     furosemide 40 MG tablet  Commonly known as:  LASIX  TAKE TWO TABLETS BY MOUTH IN THE MORNING, THEN TAKE ONE IN THE EVENING, YOU CAN TAKE AN EXTRA 40 MG IN THE EVENING IF NEEDED     HAIR/SKIN/NAILS/BIOTIN Tabs  Take 1 tablet by mouth daily after lunch.     hydrALAZINE 25 MG tablet  Commonly known as:  APRESOLINE  Take 25 mg by mouth 2 (two) times daily.     hydrALAZINE 25 MG tablet  Commonly known as:  APRESOLINE  TAKE ONE TABLET BY MOUTH EVERY 8 HOURS     isosorbide mononitrate 60 MG 24 hr tablet  Commonly known as:  IMDUR  Take 1 tablet (60 mg total) by mouth daily.     levothyroxine 50 MCG tablet  Commonly known as:  SYNTHROID, LEVOTHROID  Take 50 mcg by mouth daily before breakfast.     pantoprazole 40 MG tablet  Commonly known as:  PROTONIX  Take 40 mg by mouth daily after lunch.     polyethylene glycol packet  Commonly known as:  MIRALAX / GLYCOLAX  Take 17 g by mouth daily as needed (constipation). Mix in 8 oz juice and drink     potassium chloride SA 20 MEQ tablet  Commonly known as:  K-DUR,KLOR-CON  Take 20 mEq by mouth 2 (two) times daily.     pravastatin 40 MG tablet  Commonly known as:  PRAVACHOL  Take 40 mg by mouth at bedtime.     TOZAL Caps  Take 1 capsule by mouth 2 (two) times daily.       VISINE OP  Place 1 drop into both eyes daily as needed (itching).     vitamin C 500 MG tablet  Commonly known as:  ASCORBIC ACID  Take 500 mg by mouth daily after lunch.     vitamin E 400 UNIT capsule  Take 400 Units by mouth daily after lunch.        Disposition:  Discharge Instructions    Diet - low sodium heart healthy    Complete by:  As directed      Increase activity slowly    Complete by:  As directed           Follow-up Information    Follow up with [facility] On 06/02/2015.   Specialty:  Cardiology   Why:  at 46 AM for wound check    Contact information:   [address], [place] [phone]      Follow up with Cristopher Peru, MD On 08/19/2015.   Specialty:  Cardiology   Why:  at 9:15AM    Contact information:   [place]. [place] [address] [phone]       Duration of Discharge Encounter: Greater than 30 minutes including physician time.  Signed, Chanetta Marshall, NP 05/18/2015 6:27 AM   EP Attending  Patient seen and examined. Agree with the findings as noted above by Chanetta Marshall, NP-C. The patient is stable after PPM insertion. Will discharge home with usual followup. She will restart her amio at 100 mg daily.  Mikle Bosworth.D.

## 2015-05-18 NOTE — Discharge Instructions (Signed)
Supplemental Discharge Instructions for  Pacemaker/Defibrillator Patients  Activity No heavy lifting or vigorous activity with your left/right arm for 6 to 8 weeks.  Do not raise your left/right arm above your head for one week.  Gradually raise your affected arm as drawn below.           __         05/21/15                      05/22/15                   05/23/15                 05/24/15  NO DRIVING for 1 week  WOUND CARE - Keep the wound area clean and dry.  Do not get this area wet for one week. No showers for one week; you may shower on  05/24/15   . - The tape/steri-strips on your wound will fall off; do not pull them off.  No bandage is needed on the site.  DO  NOT apply any creams, oils, or ointments to the wound area. - If you notice any drainage or discharge from the wound, any swelling or bruising at the site, or you develop a fever > 101? F after you are discharged home, call the office at once.  Special Instructions - You are still able to use cellular telephones; use the ear opposite the side where you have your pacemaker/defibrillator.  Avoid carrying your cellular phone near your device. - When traveling through airports, show security personnel your identification card to avoid being screened in the metal detectors.  Ask the security personnel to use the hand wand. - Avoid arc welding equipment, MRI testing (magnetic resonance imaging), TENS units (transcutaneous nerve stimulators).  Call the office for questions about other devices. - Avoid electrical appliances that are in poor condition or are not properly grounded. - Microwave ovens are safe to be near or to operate.    Heart-Healthy Eating Plan Many factors influence your heart health, including eating and exercise habits. Heart (coronary) risk increases with abnormal blood fat (lipid) levels. Heart-healthy meal planning includes limiting unhealthy fats, increasing healthy fats, and making other small dietary changes.  This includes maintaining a healthy body weight to help keep lipid levels within a normal range. WHAT IS MY PLAN?  Your health care provider recommends that you:  Get no more than _________% of the total calories in your daily diet from fat.  Limit your intake of saturated fat to less than _________% of your total calories each day.  Limit the amount of cholesterol in your diet to less than _________ mg per day. WHAT TYPES OF FAT SHOULD I CHOOSE?  Choose healthy fats more often. Choose monounsaturated and polyunsaturated fats, such as olive oil and canola oil, flaxseeds, walnuts, almonds, and seeds.  Eat more omega-3 fats. Good choices include salmon, mackerel, sardines, tuna, flaxseed oil, and ground flaxseeds. Aim to eat fish at least two times each week.  Limit saturated fats. Saturated fats are primarily found in animal products, such as meats, butter, and cream. Plant sources of saturated fats include palm oil, palm kernel oil, and coconut oil.  Avoid foods with partially hydrogenated oils in them. These contain trans fats. Examples of foods that contain trans fats are stick margarine, some tub margarines, cookies, crackers, and other baked goods. WHAT GENERAL GUIDELINES DO I NEED TO FOLLOW?  Check food labels carefully to identify foods with trans fats or high amounts of saturated fat.  Fill one half of your plate with vegetables and green salads. Eat 4-5 servings of vegetables per day. A serving of vegetables equals 1 cup of raw leafy vegetables,  cup of raw or cooked cut-up vegetables, or  cup of vegetable juice.  Fill one fourth of your plate with whole grains. Look for the word "whole" as the first word in the ingredient list.  Fill one fourth of your plate with lean protein foods.  Eat 4-5 servings of fruit per day. A serving of fruit equals one medium whole fruit,  cup of dried fruit,  cup of fresh, frozen, or canned fruit, or  cup of 100% fruit juice.  Eat more foods  that contain soluble fiber. Examples of foods that contain this type of fiber are apples, broccoli, carrots, beans, peas, and barley. Aim to get 20-30 g of fiber per day.  Eat more home-cooked food and less restaurant, buffet, and fast food.  Limit or avoid alcohol.  Limit foods that are high in starch and sugar.  Avoid fried foods.  Cook foods by using methods other than frying. Baking, boiling, grilling, and broiling are all great options. Other fat-reducing suggestions include:  Removing the skin from poultry.  Removing all visible fats from meats.  Skimming the fat off of stews, soups, and gravies before serving them.  Steaming vegetables in water or broth.  Lose weight if you are overweight. Losing just 5-10% of your initial body weight can help your overall health and prevent diseases such as diabetes and heart disease.  Increase your consumption of nuts, legumes, and seeds to 4-5 servings per week. One serving of dried beans or legumes equals  cup after being cooked, one serving of nuts equals 1 ounces, and one serving of seeds equals  ounce or 1 tablespoon.  You may need to monitor your salt (sodium) intake, especially if you have high blood pressure. Talk with your health care provider or dietitian to get more information about reducing sodium. WHAT FOODS CAN I EAT? Grains Breads, including [place], white, pita, wheat, raisin, rye, oatmeal, and [place]. Tortillas that are neither fried nor made with lard or trans fat. Low-fat rolls, including hotdog and hamburger buns and English muffins. Biscuits. Muffins. Waffles. Pancakes. Light popcorn. Whole-grain cereals. Flatbread. Melba toast. Pretzels. Breadsticks. Rusks. Low-fat snacks and crackers, including oyster, saltine, matzo, graham, animal, and rye. Rice and pasta, including brown rice and those that are made with whole wheat. Vegetables All vegetables. Fruits All fruits, but limit coconut. Meats and Other Protein  Sources Lean, well-trimmed beef, veal, pork, and lamb. Chicken and [place] without skin. All fish and shellfish. Wild duck, rabbit, pheasant, and venison. Egg whites or low-cholesterol egg substitutes. Dried beans, peas, lentils, and tofu.Seeds and most nuts. Dairy Low-fat or nonfat cheeses, including ricotta, string, and mozzarella. Skim or 1% milk that is liquid, powdered, or evaporated. Buttermilk that is made with low-fat milk. Nonfat or low-fat yogurt. Beverages Mineral water. Diet carbonated beverages. Sweets and Desserts Sherbets and fruit ices. Honey, jam, marmalade, jelly, and syrups. Meringues and gelatins. Pure sugar candy, such as hard candy, jelly beans, gumdrops, mints, marshmallows, and small amounts of dark chocolate. [employer] Eat all sweets and desserts in moderation. Fats and Oils Nonhydrogenated (trans-free) margarines. Vegetable oils, including soybean, sesame, sunflower, olive, peanut, safflower, corn, canola, and cottonseed. Salad dressings or mayonnaise that are made with a vegetable oil.  Limit added fats and oils that you use for cooking, baking, salads, and as spreads. Other Cocoa powder. Coffee and tea. All seasonings and condiments. The items listed above may not be a complete list of recommended foods or beverages. Contact your dietitian for more options. WHAT FOODS ARE NOT RECOMMENDED? Grains Breads that are made with saturated or trans fats, oils, or whole milk. Croissants. Butter rolls. Cheese breads. Sweet rolls. Donuts. Buttered popcorn. Chow mein noodles. High-fat crackers, such as cheese or butter crackers. Meats and Other Protein Sources Fatty meats, such as hotdogs, short ribs, sausage, spareribs, bacon, ribeye roast or steak, and mutton. High-fat deli meats, such as salami and bologna. Caviar. Domestic duck and goose. Organ meats, such as kidney, liver, sweetbreads, brains, gizzard, chitterlings, and heart. Dairy Cream, sour cream, cream cheese, and  creamed cottage cheese. Whole milk cheeses, including blue (bleu), Monterey Jack, [place], [place], American, [place], Swiss, [place], [place], and [place]. Whole or 2% milk that is liquid, evaporated, or condensed. Whole buttermilk. Cream sauce or high-fat cheese sauce. Yogurt that is made from whole milk. Beverages Regular sodas and drinks with added sugar. Sweets and Desserts Frosting. Pudding. Cookies. Cakes other than angel food cake. Candy that has milk chocolate or white chocolate, hydrogenated fat, butter, coconut, or unknown ingredients. Buttered syrups. Full-fat ice cream or ice cream drinks. Fats and Oils Gravy that has suet, meat fat, or shortening. Cocoa butter, hydrogenated oils, palm oil, coconut oil, palm kernel oil. These can often be found in baked products, candy, fried foods, nondairy creamers, and whipped toppings. Solid fats and shortenings, including bacon fat, salt pork, lard, and butter. Nondairy cream substitutes, such as coffee creamers and sour cream substitutes. Salad dressings that are made of unknown oils, cheese, or sour cream. The items listed above may not be a complete list of foods and beverages to avoid. Contact your dietitian for more information.   This information is not intended to replace advice given to you by your health care provider. Make sure you discuss any questions you have with your health care provider.   Document Released: 01/03/2008 Document Revised: 04/16/2014 Document Reviewed: 09/17/2013 Elsevier Interactive Patient Education [employer].

## 2015-05-19 ENCOUNTER — Other Ambulatory Visit: Payer: Self-pay | Admitting: *Deleted

## 2015-05-19 NOTE — Patient Outreach (Signed)
S:  Pt has her pacermaker placement yesterday without any complications. She spent one night in the hospital. Her family is supportive and she has 3 daughters looking after her. She states she feels better already, is less SOB. She has minor incisional site discomfort.  O:  BP 160/89 mmHg  Pulse 66  Resp 18  Wt 148 lb (67.132 kg)  SpO2 96%       RRR       Lungs slight crackles in bases on deep expiration.       No edema.  A:   Resolved bradycardia with pacemaker placement        P:   I will call weekly for the next month and see Laura Zuniga on March 9th.       Reinforced discharge instructions.       Reminded to call me or MD if any problem arises.  Deloria Lair [facility] [place] [phone]

## 2015-05-20 ENCOUNTER — Other Ambulatory Visit: Payer: Self-pay | Admitting: *Deleted

## 2015-05-26 ENCOUNTER — Ambulatory Visit: Payer: Self-pay | Admitting: *Deleted

## 2015-05-26 ENCOUNTER — Ambulatory Visit: Payer: PPO | Admitting: *Deleted

## 2015-05-27 ENCOUNTER — Other Ambulatory Visit: Payer: Self-pay | Admitting: *Deleted

## 2015-05-27 NOTE — Patient Outreach (Signed)
Transition of care call #2 - No one answered the call. I left a message and requested a return call.  Deloria Lair [facility] [place] [phone]

## 2015-05-27 NOTE — Patient Outreach (Signed)
Transition of care call - No one was at home, I left a message to return my call.  Laura Zuniga [facility] [place] [phone]

## 2015-06-02 ENCOUNTER — Ambulatory Visit (INDEPENDENT_AMBULATORY_CARE_PROVIDER_SITE_OTHER): Payer: PPO | Admitting: *Deleted

## 2015-06-02 ENCOUNTER — Encounter: Payer: Self-pay | Admitting: Internal Medicine

## 2015-06-02 DIAGNOSIS — Z950 Presence of cardiac pacemaker: Secondary | ICD-10-CM

## 2015-06-02 DIAGNOSIS — I495 Sick sinus syndrome: Secondary | ICD-10-CM

## 2015-06-02 LAB — CUP PACEART INCLINIC DEVICE CHECK
Battery Remaining Longevity: 78
Brady Statistic RA Percent Paced: 99.81 %
Brady Statistic RV Percent Paced: 16 %
Date Time Interrogation Session: 20170223122649
Implantable Lead Location: 753860
Lead Channel Impedance Value: 487.5 Ohm
Lead Channel Pacing Threshold Amplitude: 1.25 V
Lead Channel Pacing Threshold Pulse Width: 0.5 ms
Lead Channel Setting Pacing Amplitude: 3.125
Lead Channel Setting Sensing Sensitivity: 2 mV
MDC IDC LEAD IMPLANT DT: 20170207
MDC IDC LEAD IMPLANT DT: 20170207
MDC IDC LEAD LOCATION: 753859
MDC IDC MSMT BATTERY VOLTAGE: 3.07 V
MDC IDC MSMT LEADCHNL RA IMPEDANCE VALUE: 412.5 Ohm
MDC IDC MSMT LEADCHNL RA PACING THRESHOLD AMPLITUDE: 1.25 V
MDC IDC MSMT LEADCHNL RA PACING THRESHOLD PULSEWIDTH: 0.5 ms
MDC IDC MSMT LEADCHNL RA SENSING INTR AMPL: 0.7 mV
MDC IDC MSMT LEADCHNL RV PACING THRESHOLD AMPLITUDE: 0.75 V
MDC IDC MSMT LEADCHNL RV PACING THRESHOLD AMPLITUDE: 0.75 V
MDC IDC MSMT LEADCHNL RV PACING THRESHOLD PULSEWIDTH: 0.5 ms
MDC IDC MSMT LEADCHNL RV PACING THRESHOLD PULSEWIDTH: 0.5 ms
MDC IDC MSMT LEADCHNL RV SENSING INTR AMPL: 9.2 mV
MDC IDC PG SERIAL: 7866856
MDC IDC SET LEADCHNL RV PACING AMPLITUDE: 1 V
MDC IDC SET LEADCHNL RV PACING PULSEWIDTH: 0.5 ms
Pulse Gen Model: 2240

## 2015-06-02 NOTE — Progress Notes (Signed)
Wound check appointment. Steri-strips removed. Suture visible at medial incision- clipped with suture removal kit. Wound without redness or edema. Incision edges approximated. Advised patient to monitor incision and call us back if she notes redness, swelling, drainage, fever or chills. Normal device function. Thresholds, sensing, and impedances consistent with implant measurements. Device programmed with auto capture programmed on for extra safety margin until 3 month visit. Histogram distribution appropriate for patient and level of activity. No mode switches or high ventricular rates noted. Patient educated about wound care, arm mobility, lifting restrictions. ROV in with GT 08/19/15.

## 2015-06-03 ENCOUNTER — Other Ambulatory Visit: Payer: Self-pay | Admitting: *Deleted

## 2015-06-03 NOTE — Patient Outreach (Signed)
Transition of care call #3 - I spoke to pt's daughter [name] with whom pt lives and is her primary care giver. She reports she went to the cardiogist yesterday and had her check up. They checked her pacemaker site and took out the stitches. She is doing very well. No new problems or questions.  I will call again next week and see her on 06/16/15.  Deloria Lair [facility] [place] [phone]

## 2015-06-10 ENCOUNTER — Other Ambulatory Visit: Payer: Self-pay | Admitting: *Deleted

## 2015-06-10 NOTE — Patient Outreach (Signed)
Transition of care call #4 - I spoke with pt's daughter, [name]. She said they just got up (late risers) and she reports her mother is doing well. She does report last weekend her wt went up to 150 from it's usual 145 and she gave her an extra dose of lasix on Sunday and Monday and her wt. Came back down to 145 and she is less breathless. She does not have any swelling.  I praised [name] for her good judgement to add the extra diuretic. I asked her if she felt she needed to do that more than 2 days in a row to please let me or her cardiologist know as we may need to check her blood work and look at there potassium level and kidney function. She acknowledged this advise.  We have a home visit scheduled for next Thursday.  Deloria Lair [facility] [place] [phone]

## 2015-06-16 ENCOUNTER — Other Ambulatory Visit: Payer: Self-pay | Admitting: *Deleted

## 2015-06-16 VITALS — BP 142/68 | HR 68 | Resp 18

## 2015-06-16 DIAGNOSIS — I5042 Chronic combined systolic (congestive) and diastolic (congestive) heart failure: Secondary | ICD-10-CM

## 2015-06-16 NOTE — Addendum Note (Signed)
Addended by: Deloria Lair on: 06/16/2015 04:37 PM   Modules accepted: Orders

## 2015-06-16 NOTE — Patient Outreach (Signed)
[place]) Care Management  06/16/2015  Fumiyo Huguley Meryle Ready 10/09/1917 [id number]   S:  Pt is doing very well. She has had one episode of being more SOB, probably due to eating out.  Her daughter gave her 2 days of additional 40 mg furosemide and she returned to baseline. She is not weighing everyday. Pt denies any pain, SOB or edema at present.  O:  BP 142/68 mmHg  Pulse 68  Resp 18  SpO2 96% Weight 143.       RRR       Lungs clear       No edema       Surgical site: edges are well approximated, no redness, well healed.  A:   HF - stable  P:  Pt is stable and ready for care management case closure.       Reinforced low sodium diet and daily weighing. If wt goes to 145 or pt gets SOB please give an             extra 40 mg of furosemide.       I am transferring this pt to telephone disease management with a health coach.  Deloria Lair [facility] [place] [phone]

## 2015-06-29 DIAGNOSIS — J209 Acute bronchitis, unspecified: Secondary | ICD-10-CM | POA: Diagnosis not present

## 2015-07-04 DIAGNOSIS — J209 Acute bronchitis, unspecified: Secondary | ICD-10-CM | POA: Diagnosis not present

## 2015-07-04 DIAGNOSIS — R05 Cough: Secondary | ICD-10-CM | POA: Diagnosis not present

## 2015-07-05 ENCOUNTER — Other Ambulatory Visit: Payer: Self-pay | Admitting: Internal Medicine

## 2015-07-05 ENCOUNTER — Ambulatory Visit
Admission: RE | Admit: 2015-07-05 | Discharge: 2015-07-05 | Disposition: A | Discharge status: Home / Self Care | Payer: PPO | Source: Ambulatory Visit | Attending: Internal Medicine | Admitting: Internal Medicine

## 2015-07-05 DIAGNOSIS — R0602 Shortness of breath: Secondary | ICD-10-CM | POA: Diagnosis not present

## 2015-07-05 DIAGNOSIS — J209 Acute bronchitis, unspecified: Secondary | ICD-10-CM

## 2015-07-05 DIAGNOSIS — R05 Cough: Secondary | ICD-10-CM | POA: Diagnosis not present

## 2015-07-14 ENCOUNTER — Ambulatory Visit: Payer: Self-pay | Admitting: *Deleted

## 2015-07-14 ENCOUNTER — Other Ambulatory Visit: Payer: Self-pay | Admitting: *Deleted

## 2015-07-14 NOTE — Patient Outreach (Signed)
[facility]) Care Management  07/14/2015  Chenise Perilli [facility] 1918-04-04 [id number]   RN Health Coach telephone call to patient daughter. [employer] compliance verified.  Spoke with [name] who is the caregiver. Per [name] this is not a good time she is on her way out and then she has to work at [employer] asked when is a good time to call back and per [name] stated on Sat.   Plan : RN will follow up with daughter who is the caregiver.  [place] [facility] [phone]

## 2015-07-29 ENCOUNTER — Ambulatory Visit: Payer: Self-pay | Admitting: *Deleted

## 2015-07-29 ENCOUNTER — Other Ambulatory Visit: Payer: Self-pay | Admitting: *Deleted

## 2015-07-29 NOTE — Patient Outreach (Signed)
07/29/15 Telephone call for assessment and no answer to telephone, no option to leave voicemail.  Jacqlyn Larsen [facility], Council Coordinator [phone]

## 2015-08-19 ENCOUNTER — Encounter: Payer: PPO | Admitting: Internal Medicine

## 2015-08-19 ENCOUNTER — Ambulatory Visit (INDEPENDENT_AMBULATORY_CARE_PROVIDER_SITE_OTHER): Payer: PPO | Admitting: Internal Medicine

## 2015-08-19 ENCOUNTER — Encounter: Payer: Self-pay | Admitting: Internal Medicine

## 2015-08-19 VITALS — BP 138/62 | HR 60 | Ht 60.0 in | Wt 146.6 lb

## 2015-08-19 DIAGNOSIS — I495 Sick sinus syndrome: Secondary | ICD-10-CM

## 2015-08-19 DIAGNOSIS — I480 Paroxysmal atrial fibrillation: Secondary | ICD-10-CM

## 2015-08-19 LAB — CUP PACEART INCLINIC DEVICE CHECK
Battery Remaining Longevity: 105.6
Battery Voltage: 3.01 V
Implantable Lead Implant Date: 20170207
Implantable Lead Location: 753859
Lead Channel Impedance Value: 437.5 Ohm
Lead Channel Pacing Threshold Amplitude: 0.5 V
Lead Channel Pacing Threshold Amplitude: 1.25 V
Lead Channel Pacing Threshold Amplitude: 1.5 V
Lead Channel Pacing Threshold Pulse Width: 0.5 ms
Lead Channel Pacing Threshold Pulse Width: 0.5 ms
Lead Channel Sensing Intrinsic Amplitude: 1.4 mV
Lead Channel Sensing Intrinsic Amplitude: 7.3 mV
Lead Channel Setting Pacing Amplitude: 2.5 V
MDC IDC LEAD IMPLANT DT: 20170207
MDC IDC LEAD LOCATION: 753860
MDC IDC MSMT LEADCHNL RA PACING THRESHOLD PULSEWIDTH: 0.5 ms
MDC IDC MSMT LEADCHNL RV IMPEDANCE VALUE: 450 Ohm
MDC IDC MSMT LEADCHNL RV PACING THRESHOLD AMPLITUDE: 0.5 V
MDC IDC MSMT LEADCHNL RV PACING THRESHOLD PULSEWIDTH: 0.5 ms
MDC IDC PG SERIAL: 7866856
MDC IDC SESS DTM: 20170512144413
MDC IDC SET LEADCHNL RV PACING AMPLITUDE: 2.5 V
MDC IDC SET LEADCHNL RV PACING PULSEWIDTH: 0.5 ms
MDC IDC SET LEADCHNL RV SENSING SENSITIVITY: 2 mV
MDC IDC STAT BRADY RA PERCENT PACED: 99 %
MDC IDC STAT BRADY RV PERCENT PACED: 20 %

## 2015-08-19 NOTE — Progress Notes (Signed)
HPI Laura Zuniga returns today for ongoing evaluation of sinus node dysfunction and sob, s/p PPM. She has a h/o several falls though none recently. No obvious syncope. She was found to have sinus node dysfunction with junctional rhythm on no sinus nodal blocking drugs and underwent PPM insertion several months ago. In the interim she has been stable except she continues to have chronic dyspnea. No other complaints. Allergies  Allergen Reactions  . Penicillins Other (See Comments)    Unknown allergic reaction  . 2,4-D Dimethylamine (Amisol) Other (See Comments)  . Ace Inhibitors Other (See Comments)  . Lanolin Hives  . Sulfonamide Derivatives Swelling and Rash     Current Outpatient Prescriptions  Medication Sig Dispense Refill  . acetaminophen (TYLENOL) 500 MG tablet Take 500-1,000 mg by mouth every 6 (six) hours as needed for moderate pain or headache.    . albuterol (PROVENTIL HFA;VENTOLIN HFA) 108 (90 BASE) MCG/ACT inhaler Inhale 2 puffs into the lungs every 6 (six) hours as needed for wheezing or shortness of breath. 1 Inhaler 3  . amiodarone (PACERONE) 100 MG tablet Take 1 tablet (100 mg total) by mouth daily. 30 tablet 3  . calcium-vitamin D (OSCAL WITH D) 500-200 MG-UNIT per tablet Take 1 tablet by mouth daily after lunch.     . clopidogrel (PLAVIX) 75 MG tablet TAKE ONE TABLET BY MOUTH ONCE DAILY 30 tablet 3  . Cranberry 250 MG CAPS Take 250 mg by mouth daily after lunch.     . Dietary Management Product (TOZAL) CAPS Take 1 capsule by mouth 2 (two) times daily.    Marland Kitchen escitalopram (LEXAPRO) 10 MG tablet Take 10 mg by mouth daily.     . ferrous sulfate 325 (65 FE) MG tablet Take 325 mg by mouth daily after lunch.     . furosemide (LASIX) 40 MG tablet TAKE TWO TABLETS BY MOUTH IN THE MORNING, THEN TAKE ONE IN THE EVENING, YOU CAN TAKE AN EXTRA 40 MG IN THE EVENING IF NEEDED 90 tablet 6  . hydrALAZINE (APRESOLINE) 25 MG tablet Take 25 mg by mouth 2 (two) times daily.    .  isosorbide mononitrate (IMDUR) 60 MG 24 hr tablet Take 1 tablet (60 mg total) by mouth daily. 90 tablet 5  . levothyroxine (SYNTHROID, LEVOTHROID) 50 MCG tablet Take 50 mcg by mouth daily before breakfast.     . Multiple Vitamins-Minerals (HAIR/SKIN/NAILS/BIOTIN) TABS Take 1 tablet by mouth daily after lunch.    . pantoprazole (PROTONIX) 40 MG tablet Take 40 mg by mouth daily after lunch.     . polyethylene glycol (MIRALAX / GLYCOLAX) packet Take 17 g by mouth daily as needed (constipation). Mix in 8 oz juice and drink    . potassium chloride SA (K-DUR,KLOR-CON) 20 MEQ tablet Take 20 mEq by mouth 2 (two) times daily.     . pravastatin (PRAVACHOL) 40 MG tablet Take 40 mg by mouth at bedtime.     . Tetrahydrozoline HCl (VISINE OP) Place 1 drop into both eyes daily as needed (itching).    . vitamin C (ASCORBIC ACID) 500 MG tablet Take 500 mg by mouth daily after lunch.     . vitamin E 400 UNIT capsule Take 400 Units by mouth daily after lunch.     No current facility-administered medications for this visit.     Past Medical History  Diagnosis Date  . Chronic diastolic CHF (congestive heart failure) ([place])     a. Echo 6/15 - Mild LVH,  EF 55-60%, mild AI, mild MR, moderate LAE, mild RAE, PASP 52 mm Hg  . GERD (gastroesophageal reflux disease)   . Hypertension   . Diverticulitis   . CAD (coronary artery disease)     never had cardiac cath, CAD dx by positive ant. wall defect on Nuc. study 2007 >>  no cath due to high risk/advanced age  . Macular degeneration   . PAF (paroxysmal atrial fibrillation) ([facility])     not optimal candidate for anticoagulation  . COPD (chronic obstructive pulmonary disease) ([place])   . C. difficile colitis 2015  . Bradycardia     a. Amiodarone DC'd 12/16 >> b. Holter 1/17 - NSR, sinus brady, junctional rhythm, pause 2.4 s  . CKD (chronic kidney disease)     ROS:   All systems reviewed and negative except as noted in the HPI.   Past Surgical History  Procedure  Laterality Date  . Cholecystectomy    . Appendectomy    . Vaginal hysterectomy    . Eye surgery    . Esophagogastroduodenoscopy N/A 09/22/2013    Procedure: ESOPHAGOGASTRODUODENOSCOPY (EGD);  Surgeon: Lear Ng, MD;  Location: [facility] ENDOSCOPY;  Service: Endoscopy;  Laterality: N/A;  . Flexible sigmoidoscopy N/A 09/22/2013    Procedure: FLEXIBLE SIGMOIDOSCOPY;  Surgeon: Lear Ng, MD;  Location: [place];  Service: Endoscopy;  Laterality: N/A;  . Flexible sigmoidoscopy N/A 09/22/2013    Procedure: FLEXIBLE SIGMOIDOSCOPY;  Surgeon: Lear Ng, MD;  Location: [place];  Service: Endoscopy;  Laterality: N/A;  unprepped/ egd first  . Esophagogastroduodenoscopy N/A 09/22/2013    Procedure: ESOPHAGOGASTRODUODENOSCOPY (EGD);  Surgeon: Lear Ng, MD;  Location: [facility] ENDOSCOPY;  Service: Endoscopy;  Laterality: N/A;  . Ep implantable device N/A 05/17/2015    Procedure: Pacemaker Implant;  Surgeon: Evans Lance, MD;  Location: [place] CV LAB;  Service: Cardiovascular;  Laterality: N/A;     Family History  Problem Relation Age of Onset  . Heart attack Father 9  . Heart attack Sister   . Cancer Brother   . Cancer Brother   . Cirrhosis Brother   . Heart disease Brother   . Cancer Brother   . COPD Brother   . COPD Sister   . Cancer Sister   . Cancer Sister   . Hypertension Daughter   . Stroke Neg Hx      Social History   Social History  . Marital Status: Widowed    Spouse Name: N/A  . Number of Children: N/A  . Years of Education: N/A   Occupational History  . Not on file.   Social History Main Topics  . Smoking status: Never Smoker   . Smokeless tobacco: Never Used  . Alcohol Use: No  . Drug Use: No  . Sexual Activity: No   Other Topics Concern  . Not on file   Social History Narrative        father died age 53: Mass[place] MI         mother died age 25: gallbladder surgery complications         4 brothers: all deceased; prostate  ca, throat ca, emphesema, MI, cirrhosis from ETOH         3 sisters: one living: MI, emphesema, 2 sisters with breast CA     BP 138/62 mmHg  Pulse 60  Ht 5' (1.524 m)  Wt 146 lb 9.6 oz (66.497 kg)  BMI 28.63 kg/m2  SpO2 96%  Physical Exam:  Well  appearing 82 yo woman, NAD HEENT: Unremarkable Neck:  6 cm JVD, no thyromegally Back:  No CVA tenderness Lungs:  Clear with no wheezes HEART:  Regular brady rhythm, no murmurs, no rubs, no clicks Abd:  soft, positive bowel sounds, no organomegally, no rebound, no guarding Ext:  2 plus pulses, no edema, no cyanosis, no clubbing Skin:  No rashes no nodules Neuro:  CN II through XII intact, motor grossly intact  EKG - Nsr with atrial pacing at 60/min  Assess/Plan: 1. Sinus node dysfunction - she is asymptomatic, s/p PPM insertion 2. PAF - she has maintained NSR on low dose amiodarone, going out of rhythm approx. 1% of the time. She is not a good systemic anti-coagulation candidate with her h/o falls. CHADSVASC 4. 3. HTN - her blood pressure is reasonably well controlled. She is encouraged to reduce her sodium intake. 4. PPM - her [employer] DDD PM is working normally. Will recheck in several months.  Mikle Bosworth.D.

## 2015-08-19 NOTE — Patient Instructions (Signed)
Medication Instructions:  Your physician recommends that you continue on your current medications as directed. Please refer to the Current Medication list given to you today.   Labwork: None ordered   Testing/Procedures: None ordered   Follow-Up: Your physician wants you to follow-up in: 9 months with Dr Knox Saliva will receive a reminder letter in the mail two months in advance. If you don't receive a letter, please call our office to schedule the follow-up appointment.   Remote monitoring is used to monitor your Pacemaker from home. This monitoring reduces the number of office visits required to check your device to one time per year. It allows [place] to keep an eye on the functioning of your device to ensure it is working properly. You are scheduled for a device check from home on 11/21/15. You may send your transmission at any time that day. If you have a wireless device, the transmission will be sent automatically. After your physician reviews your transmission, you will receive a postcard with your next transmission date.    Any Other Special Instructions Will Be Listed Below (If Applicable).     If you need a refill on your cardiac medications before your next appointment, please call your pharmacy.

## 2015-08-29 ENCOUNTER — Encounter: Payer: Self-pay | Admitting: Nurse Practitioner

## 2015-08-29 ENCOUNTER — Ambulatory Visit (INDEPENDENT_AMBULATORY_CARE_PROVIDER_SITE_OTHER): Payer: PPO | Admitting: Nurse Practitioner

## 2015-08-29 ENCOUNTER — Ambulatory Visit (HOSPITAL_COMMUNITY)
Admission: RE | Admit: 2015-08-29 | Discharge: 2015-08-29 | Disposition: A | Discharge status: Home / Self Care | Payer: PPO | Source: Ambulatory Visit | Attending: Nurse Practitioner | Admitting: Nurse Practitioner

## 2015-08-29 ENCOUNTER — Ambulatory Visit (HOSPITAL_BASED_OUTPATIENT_CLINIC_OR_DEPARTMENT_OTHER)
Admission: RE | Admit: 2015-08-29 | Discharge: 2015-08-29 | Disposition: A | Discharge status: Home / Self Care | Payer: PPO | Source: Ambulatory Visit

## 2015-08-29 ENCOUNTER — Telehealth: Payer: Self-pay | Admitting: Interventional Cardiology

## 2015-08-29 ENCOUNTER — Telehealth: Payer: Self-pay | Admitting: *Deleted

## 2015-08-29 VITALS — BP 130/60 | HR 64 | Ht 60.0 in | Wt 147.6 lb

## 2015-08-29 DIAGNOSIS — I5032 Chronic diastolic (congestive) heart failure: Secondary | ICD-10-CM

## 2015-08-29 DIAGNOSIS — R609 Edema, unspecified: Secondary | ICD-10-CM

## 2015-08-29 DIAGNOSIS — Z950 Presence of cardiac pacemaker: Secondary | ICD-10-CM

## 2015-08-29 LAB — BASIC METABOLIC PANEL
Anion gap: 5 (ref 5–15)
BUN: 17 mg/dL (ref 6–20)
CO2: 32 mmol/L (ref 22–32)
Calcium: 9.4 mg/dL (ref 8.9–10.3)
Chloride: 100 mmol/L — ABNORMAL LOW (ref 101–111)
Creatinine, Ser: 1.2 mg/dL — ABNORMAL HIGH (ref 0.44–1.00)
GFR calc Af Amer: 43 mL/min — ABNORMAL LOW (ref >60)
GFR calc non Af Amer: 37 mL/min — ABNORMAL LOW (ref >60)
Glucose, Bld: 114 mg/dL — ABNORMAL HIGH (ref 65–99)
Potassium: 4.8 mmol/L (ref 3.5–5.1)
Sodium: 137 mmol/L (ref 135–145)

## 2015-08-29 LAB — D-DIMER, QUANTITATIVE: D-Dimer, Quant: 0.88 ug/mL-FEU — ABNORMAL HIGH (ref 0.00–0.50)

## 2015-08-29 NOTE — Telephone Encounter (Signed)
Called patient's daughter to advise her that patient's home monitor automatically sent a transmission this morning.  Her device function is stable since her last device check on 08/19/15 and reveals no alerts or abnormalities.  Patient's daughter verbalizes understanding.  Patient's daughter is concerned that patient may have a blood clot.  Advised her that I will defer that question to Truitt Merle, NP.  Patient's daughter denies additional questions at this time and is appreciative of call.

## 2015-08-29 NOTE — Telephone Encounter (Signed)
Reviewed with Truitt Merle, NP and pt can take extra Lasix today. She can see pt today at 3:30 if daughter would like pt seen. I spoke with pt's daughter and gave her information from Truitt Merle. Daughter feels pt needs to be seen and would like pacemaker checked while here.  I spoke with device clinic and they will contact pt.  Appt made for pt today at 3:30.

## 2015-08-29 NOTE — Patient Instructions (Addendum)
We will be checking the following labs today - BMET and d dimer   Medication Instructions:    Continue with your current medicines. BUT  Take the Lasix 2 pills twice a day for the next 3 days    Testing/Procedures To Be Arranged:  Upper extremity doppler and lower extremity doppler on the left side  Follow-Up:   Will see how your tests turn out.     Other Special Instructions:   N/A    If you need a refill on your cardiac medications before your next appointment, please call your pharmacy.   Call the [place] office at [phone] if you have any questions, problems or concerns.

## 2015-08-29 NOTE — Telephone Encounter (Signed)
Opened in error

## 2015-08-29 NOTE — Telephone Encounter (Signed)
Spoke with pt's daughter. She reports they took pt to the beach from Thursday to Sunday. Pt got sunburned on left arm, hand and leg. Yesterday they noticed pt has swelling in left arm from shoulder to elbow and in wrist area. Also with swelling in left leg from thigh to foot.  This is the area that is sunburned.  Pt is short of breath but no worse than usual. Ate out a lot when at beach.  Trip to beach was about 4 hours. Pt did not walk much while at beach. Used a wheelchair.  Does not weigh daily. Pacer site looks OK but does have swelling in left neck area.  She has contacted primary care and was told to contact cardiology.

## 2015-08-29 NOTE — Progress Notes (Signed)
VASCULAR LAB PRELIMINARY  PRELIMINARY  PRELIMINARY  PRELIMINARY  Left lower extremity venous duplex and left upper extremity duplex completed.    Preliminary report:   1.  Lower extremity:  Left:  No evidence of DVT, superficial thrombosis, or Baker's Cyst.  2.  Upper extremity:  Left:  No evidence of DVT or superficial thrombosis.    Ahrianna Siglin, RVT 08/29/2015, 5:36 PM

## 2015-08-29 NOTE — Progress Notes (Signed)
CARDIOLOGY OFFICE NOTE  Date:  08/29/2015    Laura Zuniga Date of Birth: Mar 06, 1918 Medical Record [id number]  PCP:  Wenda Low, MD  Cardiologist:  Lovena Le    Chief Complaint  Patient presents with  . Congestive Heart Failure  . Edema    Work in visit - seen for Dr. Lovena Le    History of Present Illness: Laura Zuniga is a 82 y.o. female who presents today for a work in visit. Seen for Dr. Lovena Le.   She has a history of sinus node dysfunction with sob, s/p PPM. She has a h/o several falls though none recently. No obvious syncope. She was found to have sinus node dysfunction with junctional rhythm on no sinus nodal blocking drugs and underwent PPM insertion several months ago. Other issues include chronic diastolic HF, CAD - managed medically, HTN, PAF - not on anticoagulation and COPD. She has chronic shortness of breath.   Last seen about 10 days ago and was felt to be doing well.  Phone call today - Spoke with pt's daughter. She reports they took pt to the beach from Thursday to Sunday. Pt got sunburned on left arm, hand and leg. Yesterday they noticed pt has swelling in left arm from shoulder to elbow and in wrist area. Also with swelling in left leg from thigh to foot. This is the area that is sunburned. Pt is short of breath but no worse than usual. Ate out a lot when at beach. Trip to beach was about 4 hours. Pt did not walk much while at beach. Used a wheelchair. Does not weigh daily. Pacer site looks OK but does have swelling in left neck area. She has contacted primary care and was told to contact cardiology.   Thus added to my schedule for today.  Comes in today. Here with her daughter - Laura Zuniga. Laura Zuniga gives most of the history. She notes more left sided swelling - especially the left arm - extends to the neck. Getting worse. Just returned from the beach yesterday. Swelling into the neck was noted today. She is pretty sedentary - sat most of the time while  she was at the beach and was in the car.  She remains short of breath - patient says it is not worse but daughter thinks it is. No actual chest pain.  Did get sunburned on the left side despite sitting under the umbrella. No sunscreen. She is on low dose amiodarone. She has no actual pain anywhere. Took medicines late today - sleeps a lot as a general rule.   Past Medical History  Diagnosis Date  . Chronic diastolic CHF (congestive heart failure) ([place])     a. Echo 6/15 - Mild LVH, EF 55-60%, mild AI, mild MR, moderate LAE, mild RAE, PASP 52 mm Hg  . GERD (gastroesophageal reflux disease)   . Hypertension   . Diverticulitis   . CAD (coronary artery disease)     never had cardiac cath, CAD dx by positive ant. wall defect on Nuc. study 2007 >>  no cath due to high risk/advanced age  . Macular degeneration   . PAF (paroxysmal atrial fibrillation) ([facility])     not optimal candidate for anticoagulation  . COPD (chronic obstructive pulmonary disease) ([place])   . C. difficile colitis 2015  . Bradycardia     a. Amiodarone DC'd 12/16 >> b. Holter 1/17 - NSR, sinus brady, junctional rhythm, pause 2.4 s  . CKD (chronic kidney disease)  Past Surgical History  Procedure Laterality Date  . Cholecystectomy    . Appendectomy    . Vaginal hysterectomy    . Eye surgery    . Esophagogastroduodenoscopy N/A 09/22/2013    Procedure: ESOPHAGOGASTRODUODENOSCOPY (EGD);  Surgeon: Lear Ng, MD;  Location: [facility] ENDOSCOPY;  Service: Endoscopy;  Laterality: N/A;  . Flexible sigmoidoscopy N/A 09/22/2013    Procedure: FLEXIBLE SIGMOIDOSCOPY;  Surgeon: Lear Ng, MD;  Location: [place];  Service: Endoscopy;  Laterality: N/A;  . Flexible sigmoidoscopy N/A 09/22/2013    Procedure: FLEXIBLE SIGMOIDOSCOPY;  Surgeon: Lear Ng, MD;  Location: [place];  Service: Endoscopy;  Laterality: N/A;  unprepped/ egd first  . Esophagogastroduodenoscopy N/A 09/22/2013    Procedure:  ESOPHAGOGASTRODUODENOSCOPY (EGD);  Surgeon: Lear Ng, MD;  [facility] END[facility]COPY;  Service: Endoscopy;  Laterality: N/A;  . Ep implantable device N/A 05/17/2015    Procedure: Pacemaker Implant;  Surgeon: Evans Lance, MD;  Location: [place] CV LAB;  Service: Cardiovascular;  Laterality: N/A;     Medications: Current Outpatient Prescriptions  Medication Sig Dispense Refill  . acetaminophen (TYLENOL) 500 MG tablet Take 500-1,000 mg by mouth every 6 (six) hours as needed for moderate pain or headache.    . albuterol (PROVENTIL HFA;VENTOLIN HFA) 108 (90 BASE) MCG/ACT inhaler Inhale 2 puffs into the lungs every 6 (six) hours as needed for wheezing or shortness of breath. 1 Inhaler 3  . amiodarone (PACERONE) 100 MG tablet Take 1 tablet (100 mg total) by mouth daily. 30 tablet 3  . calcium-vitamin D (OSCAL WITH D) 500-200 MG-UNIT per tablet Take 1 tablet by mouth daily after lunch.     . clopidogrel (PLAVIX) 75 MG tablet TAKE ONE TABLET BY MOUTH ONCE DAILY 30 tablet 3  . Cranberry 250 MG CAPS Take 250 mg by mouth daily after lunch.     . Dietary Management Product (TOZAL) CAPS Take 1 capsule by mouth 2 (two) times daily.    Marland Kitchen escitalopram (LEXAPRO) 10 MG tablet Take 10 mg by mouth daily.     . ferrous sulfate 325 (65 FE) MG tablet Take 325 mg by mouth daily after lunch.     . furosemide (LASIX) 40 MG tablet TAKE TWO TABLETS BY MOUTH IN THE MORNING, THEN TAKE ONE IN THE EVENING, YOU CAN TAKE AN EXTRA 40 MG IN THE EVENING IF NEEDED 90 tablet 6  . hydrALAZINE (APRESOLINE) 25 MG tablet Take 25 mg by mouth 2 (two) times daily.    . isosorbide mononitrate (IMDUR) 60 MG 24 hr tablet Take 1 tablet (60 mg total) by mouth daily. 90 tablet 5  . levothyroxine (SYNTHROID, LEVOTHROID) 50 MCG tablet Take 50 mcg by mouth daily before breakfast.     . Multiple Vitamins-Minerals (HAIR/SKIN/NAILS/BIOTIN) TABS Take 1 tablet by mouth daily after lunch.    . pantoprazole (PROTONIX) 40 MG tablet Take 40 mg  by mouth daily after lunch.     . polyethylene glycol (MIRALAX / GLYCOLAX) packet Take 17 g by mouth daily as needed (constipation). Mix in 8 oz juice and drink    . potassium chloride SA (K-DUR,KLOR-CON) 20 MEQ tablet Take 20 mEq by mouth 2 (two) times daily.     . pravastatin (PRAVACHOL) 40 MG tablet Take 40 mg by mouth at bedtime.     . Tetrahydrozoline HCl (VISINE OP) Place 1 drop into both eyes daily as needed (itching).    . vitamin C (ASCORBIC ACID) 500 MG tablet Take 500 mg by mouth  daily after lunch.     . vitamin E 400 UNIT capsule Take 400 Units by mouth daily after lunch.     No current facility-administered medications for this visit.    Allergies: Allergies  Allergen Reactions  . Penicillins Other (See Comments)    Unknown allergic reaction  . 2,4-D Dimethylamine (Amisol) Other (See Comments)  . Ace Inhibitors Other (See Comments)  . Lanolin Hives  . Sulfonamide Derivatives Swelling and Rash    Social History: The patient  reports that she has never smoked. She has never used smokeless tobacco. She reports that she does not drink alcohol or use illicit drugs.   Family History: The patient's family history includes COPD in her brother and sister; Cancer in her brother, brother, brother, sister, and sister; Cirrhosis in her brother; Heart attack in her sister; Heart attack (age of onset: 42) in her father; Heart disease in her brother; Hypertension in her daughter. There is no history of Stroke.   Review of Systems: Please see the history of present illness.   Otherwise, the review of systems is positive for none.   All other systems are reviewed and negative.   Physical Exam: VS:  BP 130/60 mmHg  Pulse 64  Ht 5' (1.524 m)  Wt 147 lb 9.6 oz (66.951 kg)  BMI 28.83 kg/m2 .  BMI Body mass index is 28.83 kg/(m^2).  Wt Readings from Last 3 Encounters:  08/29/15 147 lb 9.6 oz (66.951 kg)  08/19/15 146 lb 9.6 oz (66.497 kg)  05/19/15 148 lb (67.132 kg)    General:  Pleasant. Elderly female who is alert and in no acute distress. Little hard of hearing. Weight is only up one pound since last visit here.   HEENT: Normal. Neck: Supple, no JVD, carotid bruits, or masses noted.  Cardiac: Regular rate and rhythm. She clearly has left sided edema of the left neck/arm/hand. The left leg has 1+ edema. Really no edema on the right side noted at all.  Respiratory:  Lungs are clear to auscultation bilaterally with normal work of breathing.  GI: Soft and nontender.  MS: No deformity or atrophy. Gait not tested.  Skin: Warm and dry. Color is normal. She does have some mild sunburn of the left side of her body.  Neuro:  Strength and sensation are intact and no gross focal deficits noted.  Psych: Alert, appropriate and with normal affect.   LABORATORY DATA:  EKG:  EKG is not ordered today.  Lab Results  Component Value Date   WBC 6.4 05/17/2015   HGB 13.9 05/17/2015   HCT 41.5 05/17/2015   PLT 192 05/17/2015   GLUCOSE 115* 05/05/2015   CHOL 143 06/28/2011   TRIG 177* 06/28/2011   HDL 38* 06/28/2011   LDLCALC 70 06/28/2011   ALT 24 07/03/2014   AST 28 07/03/2014   NA 138 05/05/2015   K 3.8 05/05/2015   CL 99 05/05/2015   CREATININE 1.34* 05/05/2015   BUN 21 05/05/2015   CO2 28 05/05/2015   TSH 4.217 03/25/2015   INR 1.08 07/03/2014   HGBA1C 5.1 06/28/2011    BNP (last 3 results) No results for input(s): BNP in the last 8760 hours.  ProBNP (last 3 results) No results for input(s): PROBNP in the last 8760 hours.   Other Studies Reviewed Today:  Echo Study Conclusions from 2015  - Left ventricle: Wall thickness was increased in a pattern of mild LVH. Systolic function was normal. The estimated ejection fraction was in the  range of 55% to 60%. - Aortic valve: There was mild regurgitation. - Mitral valve: There was mild regurgitation. - Left atrium: The atrium was moderately dilated. - Right atrium: The atrium was mildly dilated. -  Pulmonary arteries: PA peak pressure: 52 mm Hg (S).  Assessment/Plan: 1. Left sided swelling - extends in the upper left extremity to the neck - pacemaker is 3 months old - needs doppler study of the left arm and left leg to rule out DVT - if this is + would favor sending on for admission to address anticoagulation which in the past has been avoided due to falls/frailty/age. Getting BMET and d dimer as well. If this is negative - would have her take an extra dose of Lasix for the next 3 days.   2. Underlying PPM - recent transmission received.   3. CAD - managed medically - no active chest pain.   4. PAF - on low dose amiodarone.  5. Advanced age  Current medicines are reviewed with the patient today.  The patient does not have concerns regarding medicines other than what has been noted above.  The following changes have been made:  See above.  Labs/ tests ordered today include:    Orders Placed This Encounter  Procedures  . Basic metabolic panel  . D-Dimer, Quantitative     Disposition:   Further disposition to follow.   Patient is agreeable to this plan and will call if any problems develop in the interim.   Signed: Burtis Junes, RN, ANP-C 08/29/2015 4:23 PM  [facility] [address] [place], [place]  [address] Phone: [phone] Fax: [phone]

## 2015-08-29 NOTE — Telephone Encounter (Signed)
Pt c/o swelling: STAT is pt has developed SOB within 24 hours  1. How long have you been experiencing swelling? Yesterday morning  2. Where is the swelling located? Left arm, left hand and left ankle  3.  Are you currently taking a "fluid pill"? yes  4.  Are you currently SOB? no  5.  Have you traveled recently? yes

## 2015-08-30 ENCOUNTER — Other Ambulatory Visit: Payer: Self-pay | Admitting: Nurse Practitioner

## 2015-08-30 ENCOUNTER — Ambulatory Visit (INDEPENDENT_AMBULATORY_CARE_PROVIDER_SITE_OTHER)
Admission: RE | Admit: 2015-08-30 | Discharge: 2015-08-30 | Disposition: A | Discharge status: Home / Self Care | Payer: PPO | Source: Ambulatory Visit | Attending: Nurse Practitioner | Admitting: Nurse Practitioner

## 2015-08-30 DIAGNOSIS — R7989 Other specified abnormal findings of blood chemistry: Secondary | ICD-10-CM

## 2015-08-30 DIAGNOSIS — R609 Edema, unspecified: Secondary | ICD-10-CM

## 2015-08-30 DIAGNOSIS — R791 Abnormal coagulation profile: Secondary | ICD-10-CM | POA: Diagnosis not present

## 2015-08-30 MED ORDER — IOPAMIDOL (ISOVUE-370) INJECTION 76%
60.0000 mL | Freq: Once | INTRAVENOUS | Status: AC | PRN
  Administered 2015-08-30: 60 mL via INTRAVENOUS

## 2015-08-30 NOTE — Progress Notes (Signed)
See CT note - would recommend she see primary care and use the extra Lasix for 2 more days.

## 2015-09-19 ENCOUNTER — Other Ambulatory Visit: Payer: Self-pay | Admitting: *Deleted

## 2015-09-19 NOTE — Patient Outreach (Signed)
[facility]) Care Management  09/19/2015  Laura Zuniga [facility] 1918/01/10 [id number]   Referral from community care coordinator to Health Coach for disease management for Heart Failure. Telephone call to listed contact person-daughter/caregiver-Laura Zuniga [phone]).   States patient had pacemaker placed Jan 2017. States she has heart failure, macular degeneration, and some dementia. Caregiver states she manages patient's medications and is consistent with administering to patient as directed by patient's doctors. States she has no problems getting prescriptions filled. States no problems getting appointments with doctors or transporting patient to appointments.   Caregiver states she has workable scales and knows when to report weight gain to patient's doctor. She admits to sometimes not weighing patient daily due to the busyness of the day,  States she has instructions from MD of when to give patient extra fluid pill. Caregiver voices that she would like to learn about management of patient's heart failure and consents to working with [employer].   Caregiver voices that she is best reached after 11 am Monday -Friday. .  Plan: will advise Health coach of the above information.    Sherrin Daisy, RN BSN [place] Management Coordinator [facility] Care Management  [phone]

## 2015-09-28 ENCOUNTER — Ambulatory Visit: Payer: Self-pay | Admitting: *Deleted

## 2015-09-30 ENCOUNTER — Ambulatory Visit: Payer: Self-pay | Admitting: *Deleted

## 2015-10-17 ENCOUNTER — Other Ambulatory Visit: Payer: Self-pay | Admitting: *Deleted

## 2015-10-17 ENCOUNTER — Other Ambulatory Visit: Payer: Self-pay | Admitting: Interventional Cardiology

## 2015-10-17 MED ORDER — HYDRALAZINE HCL 25 MG PO TABS: 25.0000 mg | ORAL_TABLET | Freq: Two times a day (BID) | ORAL | Status: DC

## 2015-10-17 NOTE — Patient Outreach (Signed)
[facility]) Care Management  10/17/2015  [facility] 08-02-1917 [id number]   RN Health Coach  attempted  Follow up outreach call to patient.  Patient answered phone and stated that [name] was outside mowing the grass. She stated that she handles all that and you need to call back. Plan: RN will callback within 14 days [facility] Management [phone]

## 2015-11-10 ENCOUNTER — Other Ambulatory Visit: Payer: Self-pay | Admitting: *Deleted

## 2015-11-10 NOTE — Patient Outreach (Signed)
[facility]) Care Management  11/10/2015  Chrysa Cozad [facility] [birth year] [id number]    RN Health Coach  Attempted 2nd  outreach call to patient.  Patient was unavailable. No voice mail pickup. Plan: RN will call patient again within 14 days.   [facility] Care Management [phone]

## 2015-11-17 DIAGNOSIS — L239 Allergic contact dermatitis, unspecified cause: Secondary | ICD-10-CM | POA: Diagnosis not present

## 2015-11-21 ENCOUNTER — Ambulatory Visit (INDEPENDENT_AMBULATORY_CARE_PROVIDER_SITE_OTHER): Payer: PPO | Admitting: *Deleted

## 2015-11-21 DIAGNOSIS — Z950 Presence of cardiac pacemaker: Secondary | ICD-10-CM

## 2015-11-21 DIAGNOSIS — I495 Sick sinus syndrome: Secondary | ICD-10-CM | POA: Diagnosis not present

## 2015-11-21 NOTE — Progress Notes (Signed)
Remote pacemaker transmission.   

## 2015-11-23 ENCOUNTER — Ambulatory Visit: Payer: Self-pay | Admitting: *Deleted

## 2015-11-23 ENCOUNTER — Encounter: Payer: Self-pay | Admitting: Cardiology

## 2015-11-25 LAB — CUP PACEART REMOTE DEVICE CHECK
Battery Remaining Percentage: 95.5 %
Brady Statistic AP VP Percent: 19 %
Brady Statistic AP VS Percent: 80 %
Brady Statistic AS VP Percent: 1 %
Brady Statistic AS VS Percent: 1 %
Date Time Interrogation Session: 20170814060026
Implantable Lead Implant Date: 20170207
Implantable Lead Location: 753859
Lead Channel Impedance Value: 440 Ohm
Lead Channel Pacing Threshold Amplitude: 0.5 V
Lead Channel Pacing Threshold Pulse Width: 0.5 ms
Lead Channel Sensing Intrinsic Amplitude: 3 mV
Lead Channel Sensing Intrinsic Amplitude: 7.8 mV
Lead Channel Setting Pacing Amplitude: 2.5 V
Lead Channel Setting Pacing Amplitude: 2.5 V
MDC IDC LEAD IMPLANT DT: 20170207
MDC IDC LEAD LOCATION: 753860
MDC IDC MSMT BATTERY REMAINING LONGEVITY: 100 mo
MDC IDC MSMT BATTERY VOLTAGE: 3.01 V
MDC IDC MSMT LEADCHNL RA IMPEDANCE VALUE: 440 Ohm
MDC IDC MSMT LEADCHNL RA PACING THRESHOLD AMPLITUDE: 1.5 V
MDC IDC MSMT LEADCHNL RA PACING THRESHOLD PULSEWIDTH: 0.5 ms
MDC IDC SET LEADCHNL RV PACING PULSEWIDTH: 0.5 ms
MDC IDC SET LEADCHNL RV SENSING SENSITIVITY: 2 mV
MDC IDC STAT BRADY RA PERCENT PACED: 99 %
MDC IDC STAT BRADY RV PERCENT PACED: 19 %
Pulse Gen Model: 2240
Pulse Gen Serial Number: 7866856

## 2015-12-07 ENCOUNTER — Encounter: Payer: Self-pay | Admitting: Cardiology

## 2015-12-23 DIAGNOSIS — J449 Chronic obstructive pulmonary disease, unspecified: Secondary | ICD-10-CM | POA: Diagnosis not present

## 2015-12-23 DIAGNOSIS — I5032 Chronic diastolic (congestive) heart failure: Secondary | ICD-10-CM | POA: Diagnosis not present

## 2015-12-23 DIAGNOSIS — I10 Essential (primary) hypertension: Secondary | ICD-10-CM | POA: Diagnosis not present

## 2015-12-23 DIAGNOSIS — R197 Diarrhea, unspecified: Secondary | ICD-10-CM | POA: Diagnosis not present

## 2015-12-23 DIAGNOSIS — K219 Gastro-esophageal reflux disease without esophagitis: Secondary | ICD-10-CM | POA: Diagnosis not present

## 2015-12-28 ENCOUNTER — Encounter (HOSPITAL_COMMUNITY): Payer: Self-pay

## 2015-12-28 ENCOUNTER — Emergency Department (HOSPITAL_COMMUNITY): Payer: PPO

## 2015-12-28 ENCOUNTER — Emergency Department (HOSPITAL_COMMUNITY)
Admission: EM | Admit: 2015-12-28 | Discharge: 2015-12-29 | Disposition: A | Discharge status: Home / Self Care | Payer: PPO | Attending: Emergency Medicine | Admitting: Emergency Medicine

## 2015-12-28 ENCOUNTER — Telehealth: Payer: Self-pay | Admitting: Internal Medicine

## 2015-12-28 ENCOUNTER — Encounter: Payer: Self-pay | Admitting: *Deleted

## 2015-12-28 DIAGNOSIS — J441 Chronic obstructive pulmonary disease with (acute) exacerbation: Secondary | ICD-10-CM | POA: Insufficient documentation

## 2015-12-28 DIAGNOSIS — N189 Chronic kidney disease, unspecified: Secondary | ICD-10-CM | POA: Diagnosis not present

## 2015-12-28 DIAGNOSIS — Z79899 Other long term (current) drug therapy: Secondary | ICD-10-CM | POA: Insufficient documentation

## 2015-12-28 DIAGNOSIS — I130 Hypertensive heart and chronic kidney disease with heart failure and stage 1 through stage 4 chronic kidney disease, or unspecified chronic kidney disease: Secondary | ICD-10-CM | POA: Insufficient documentation

## 2015-12-28 DIAGNOSIS — I5032 Chronic diastolic (congestive) heart failure: Secondary | ICD-10-CM | POA: Diagnosis not present

## 2015-12-28 DIAGNOSIS — I2510 Atherosclerotic heart disease of native coronary artery without angina pectoris: Secondary | ICD-10-CM | POA: Insufficient documentation

## 2015-12-28 DIAGNOSIS — R0602 Shortness of breath: Secondary | ICD-10-CM | POA: Diagnosis not present

## 2015-12-28 DIAGNOSIS — R079 Chest pain, unspecified: Secondary | ICD-10-CM | POA: Diagnosis not present

## 2015-12-28 DIAGNOSIS — R069 Unspecified abnormalities of breathing: Secondary | ICD-10-CM | POA: Diagnosis not present

## 2015-12-28 LAB — CBC
HCT: 37.4 % (ref 36.0–46.0)
Hemoglobin: 12.2 g/dL (ref 12.0–15.0)
MCH: 33.6 pg (ref 26.0–34.0)
MCHC: 32.6 g/dL (ref 30.0–36.0)
MCV: 103 fL — ABNORMAL HIGH (ref 78.0–100.0)
PLATELETS: 189 10*3/uL (ref 150–400)
RBC: 3.63 MIL/uL — AB (ref 3.87–5.11)
RDW: 12.5 % (ref 11.5–15.5)
WBC: 7.1 10*3/uL (ref 4.0–10.5)

## 2015-12-28 LAB — BASIC METABOLIC PANEL
ANION GAP: 7 (ref 5–15)
BUN: 12 mg/dL (ref 6–20)
CO2: 33 mmol/L — ABNORMAL HIGH (ref 22–32)
Calcium: 9.2 mg/dL (ref 8.9–10.3)
Chloride: 101 mmol/L (ref 101–111)
Creatinine, Ser: 1.16 mg/dL — ABNORMAL HIGH (ref 0.44–1.00)
GFR, EST AFRICAN AMERICAN: 45 mL/min — AB (ref >60)
GFR, EST NON AFRICAN AMERICAN: 38 mL/min — AB (ref >60)
Glucose, Bld: 112 mg/dL — ABNORMAL HIGH (ref 65–99)
POTASSIUM: 4 mmol/L (ref 3.5–5.1)
SODIUM: 141 mmol/L (ref 135–145)

## 2015-12-28 LAB — BRAIN NATRIURETIC PEPTIDE: B NATRIURETIC PEPTIDE 5: 123.8 pg/mL — AB (ref 0.0–100.0)

## 2015-12-28 MED ORDER — ALBUTEROL SULFATE (2.5 MG/3ML) 0.083% IN NEBU: 5.0000 mg | INHALATION_SOLUTION | Freq: Once | RESPIRATORY_TRACT | Status: DC

## 2015-12-28 MED ORDER — ALBUTEROL SULFATE (2.5 MG/3ML) 0.083% IN NEBU: 2.5000 mg | INHALATION_SOLUTION | Freq: Four times a day (QID) | RESPIRATORY_TRACT | 12 refills | Status: DC | PRN

## 2015-12-28 MED ORDER — ALBUTEROL SULFATE (2.5 MG/3ML) 0.083% IN NEBU
2.5000 mg | INHALATION_SOLUTION | Freq: Once | RESPIRATORY_TRACT | Status: AC
  Administered 2015-12-28: 2.5 mg via RESPIRATORY_TRACT
  Filled 2015-12-28: qty 3

## 2015-12-28 MED ORDER — PREDNISONE 20 MG PO TABS
60.0000 mg | ORAL_TABLET | Freq: Once | ORAL | Status: AC
  Administered 2015-12-28: 60 mg via ORAL
  Filled 2015-12-28: qty 3

## 2015-12-28 MED ORDER — PREDNISONE 50 MG PO TABS: 50.0000 mg | ORAL_TABLET | Freq: Every day | ORAL | 0 refills | Status: DC

## 2015-12-28 NOTE — Progress Notes (Deleted)
Cardiology Office Note Date:  12/28/2015  Patient ID:  Laura Zuniga, DOB 1917-12-24, MRN [id number] PCP:  Wenda Low, MD  Cardiologist:  Dr. Lovena Le  ***refresh   Chief Complaint:  SOB, cough  History of Present Illness: Laura Zuniga is a 82 y.o. female with history of sinus node dysfunction w/PPM, falls, chronic diastolic CHF, CAD being managed medically, HTN, PAF not on a/c (falls), and COPD.  She comes to the office today to be seen for Dr. Lovena Le, last seen by him in May at that time was doing well, no changes were made at that visit.    ED visit yesterday ?outcome, note not complete Labs craet 1.16, K+ 4.0, H/H ok, bnp 123  Device information: SJM dual chamber PPM, implanted 05/17/15, Dr. Lovena Le, Sick sinus syndrome AAD tx: amiodarone (100mg  daily)  Past Medical History:  Diagnosis Date  . Bradycardia    a. Amiodarone DC'd 12/16 >> b. Holter 1/17 - NSR, sinus brady, junctional rhythm, pause 2.4 s  . C. difficile colitis 2015  . CAD (coronary artery disease)    never had cardiac cath, CAD dx by positive ant. wall defect on Nuc. study 2007 >>  no cath due to high risk/advanced age  . Chronic diastolic CHF (congestive heart failure) ([place])    a. Echo 6/15 - Mild LVH, EF 55-60%, mild AI, mild MR, moderate LAE, mild RAE, PASP 52 mm Hg  . CKD (chronic kidney disease)   . COPD (chronic obstructive pulmonary disease) ([place])   . Diverticulitis   . GERD (gastroesophageal reflux disease)   . Hypertension   . Macular degeneration   . PAF (paroxysmal atrial fibrillation) ([facility])    not optimal candidate for anticoagulation    Past Surgical History:  Procedure Laterality Date  . APPENDECTOMY    . CHOLECYSTECTOMY    . EP IMPLANTABLE DEVICE N/A 05/17/2015   Procedure: Pacemaker Implant;  Surgeon: Evans Lance, MD;  Location: [place] CV LAB;  Service: Cardiovascular;  Laterality: N/A;  . ESOPHAGOGASTRODUODENOSCOPY N/A 09/22/2013   Procedure: ESOPHAGOGASTRODUODENOSCOPY  (EGD);  Surgeon: Lear Ng, MD;  Location: [facility] ENDOSCOPY;  Service: Endoscopy;  Laterality: N/A;  . ESOPHAGOGASTRODUODENOSCOPY N/A 09/22/2013   Procedure: ESOPHAGOGASTRODUODENOSCOPY (EGD);  Surgeon: Lear Ng, MD;  Location: [facility] ENDOSCOPY;  Service: Endoscopy;  Laterality: N/A;  . EYE SURGERY    . FLEXIBLE SIGMOIDOSCOPY N/A 09/22/2013   Procedure: FLEXIBLE SIGMOIDOSCOPY;  Surgeon: Lear Ng, MD;  Location: [facility] ENDOSCOPY;  Service: Endoscopy;  Laterality: N/A;  . FLEXIBLE SIGMOIDOSCOPY N/A 09/22/2013   Procedure: FLEXIBLE SIGMOIDOSCOPY;  Surgeon: Lear Ng, MD;  Location: [facility] ENDOSCOPY;  Service: Endoscopy;  Laterality: N/A;  unprepped/ egd first  . VAGINAL HYSTERECTOMY      Current Outpatient Prescriptions  Medication Sig Dispense Refill  . acetaminophen (TYLENOL) 500 MG tablet Take 500-1,000 mg by mouth every 6 (six) hours as needed for moderate pain or headache.    . albuterol (PROVENTIL HFA;VENTOLIN HFA) 108 (90 BASE) MCG/ACT inhaler Inhale 2 puffs into the lungs every 6 (six) hours as needed for wheezing or shortness of breath. 1 Inhaler 3  . amiodarone (PACERONE) 100 MG tablet Take 1 tablet (100 mg total) by mouth daily. 30 tablet 3  . calcium-vitamin D (OSCAL WITH D) 500-200 MG-UNIT per tablet Take 1 tablet by mouth daily after lunch.     . clopidogrel (PLAVIX) 75 MG tablet TAKE ONE TABLET BY MOUTH ONCE DAILY 30 tablet 3  . Cranberry 250 MG CAPS Take  250 mg by mouth daily after lunch.     . Dietary Management Product (TOZAL) CAPS Take 1 capsule by mouth 2 (two) times daily.    Marland Kitchen escitalopram (LEXAPRO) 10 MG tablet Take 10 mg by mouth daily.     . ferrous sulfate 325 (65 FE) MG tablet Take 325 mg by mouth daily after lunch.     . furosemide (LASIX) 40 MG tablet TAKE TWO TABLETS BY MOUTH IN THE MORNING, THEN TAKE ONE IN THE EVENING, YOU CAN TAKE AN EXTRA 40 MG IN THE EVENING IF NEEDED 90 tablet 6  . hydrALAZINE (APRESOLINE) 25 MG tablet Take 1 tablet (25 mg  total) by mouth 2 (two) times daily. 180 tablet 3  . isosorbide mononitrate (IMDUR) 60 MG 24 hr tablet Take 1 tablet (60 mg total) by mouth daily. 90 tablet 5  . levothyroxine (SYNTHROID, LEVOTHROID) 50 MCG tablet Take 50 mcg by mouth daily before breakfast.     . Multiple Vitamins-Minerals (HAIR/SKIN/NAILS/BIOTIN) TABS Take 1 tablet by mouth daily after lunch.    . pantoprazole (PROTONIX) 40 MG tablet Take 40 mg by mouth daily after lunch.     . polyethylene glycol (MIRALAX / GLYCOLAX) packet Take 17 g by mouth daily as needed (constipation). Mix in 8 oz juice and drink    . potassium chloride SA (K-DUR,KLOR-CON) 20 MEQ tablet Take 20 mEq by mouth 2 (two) times daily.     . pravastatin (PRAVACHOL) 40 MG tablet Take 40 mg by mouth at bedtime.     . Tetrahydrozoline HCl (VISINE OP) Place 1 drop into both eyes daily as needed (itching).    . vitamin C (ASCORBIC ACID) 500 MG tablet Take 500 mg by mouth daily after lunch.     . vitamin E 400 UNIT capsule Take 400 Units by mouth daily after lunch.     No current facility-administered medications for this visit.     Allergies:   Penicillins; 2,4-d dimethylamine (amisol); Ace inhibitors; Lanolin; and Sulfonamide derivatives   Social History:  The patient  reports that she has never smoked. She has never used smokeless tobacco. She reports that she does not drink alcohol or use drugs.   Family History:  The patient's family history includes COPD in her brother and sister; Cancer in her brother, brother, brother, sister, and sister; Cirrhosis in her brother; Heart attack in her sister; Heart attack (age of onset: 80) in her father; Heart disease in her brother; Hypertension in her daughter.  ROS:  Please see the history of present illness.   All other systems are reviewed and otherwise negative.   PHYSICAL EXAM: *** VS:  There were no vitals taken for this visit. BMI: There is no height or weight on file to calculate BMI. Well nourished, well  developed, in no acute distress  HEENT: normocephalic, atraumatic  Neck: no JVD, carotid bruits or masses Cardiac:  *** RRR; no significant murmurs, no rubs, or gallops Lungs:  clear to auscultation bilaterally, no wheezing, rhonchi or rales  Abd: soft, nontender MS: no deformity or atrophy Ext: no edema  Skin: warm and dry, no rash Neuro:  No gross deficits appreciated Psych: euthymic mood, full affect  *** PPM site is stable, no tethering or discomfort   EKG:  Done 08/19/15 A paced, V sensed PPM interrogation today: ***  09/28/13: TTE Study Conclusions - Left ventricle: Wall thickness was increased in a pattern of mild LVH. Systolic function was normal. The estimated ejection fraction was in the range  of 55% to 60%. - Aortic valve: There was mild regurgitation. - Mitral valve: There was mild regurgitation. - Left atrium: The atrium was moderately dilated. - Right atrium: The atrium was mildly dilated. - Pulmonary arteries: PA peak pressure: 52 mm Hg (S).  08/30/15: CT angio chest IMPRESSION: 1. No evidence of acute pulmonary embolism. 2. No pneumonia or effusion is noted. 3. Diffuse coronary artery calcifications  08/30/15: B/l UE venous US Summary: No evidence of deep vein or superficial thrombosis involving the right subclavian vein and left upper extremity.  Recent Labs: 03/25/2015: TSH 4.217 05/17/2015: Hemoglobin 13.9; Platelets 192 08/29/2015: BUN 17; Creatinine, Ser 1.20; Potassium 4.8; Sodium 137  No results found for requested labs within last 8760 hours.   CrCl cannot be calculated (Unknown ideal weight.).   Wt Readings from Last 3 Encounters:  08/29/15 147 lb 9.6 oz (67 kg)  08/19/15 146 lb 9.6 oz (66.5 kg)  05/19/15 148 lb (67.1 kg)     Other studies reviewed: Additional studies/records reviewed today include: summarized above  ASSESSMENT AND PLAN:  1. PAFib     CHA2DS2Vasc is at least 4, no longer on a/c with advancing age, falls,  instability  2. HTN     ***  3. Chronic diastolic CHF     ***  4. COPD     ***  Disposition: F/u with ***  Current medicines are reviewed at length with the patient today.  The patient did not have any concerns regarding medicines.***  Signed, Jennings Books, PA-C 12/28/2015 1:07 PM     [facility] [address] [place] [address] [phone] (office)  [phone] (fax)

## 2015-12-28 NOTE — ED Provider Notes (Signed)
[place] DEPT Provider Note   CSN: [id number] Arrival date & time: 12/28/15  2050     History   Chief Complaint Chief Complaint  Patient presents with  . Cough  . Shortness of Breath    HPI Laura Zuniga is a 82 y.o. female.  HPI  Pt with hx of COPD, CHF, CAD, CKD presenting with cough and wheezing.  Daughter states she has had cough and congestion with wheezing for over 1 week.  She was seen by PMD and had no wheezing at that time.  Symptoms have been worse the past couple of days.  Daughter gave albuterol x 1 for the past 2 days which did seem to help temporaririly.  She also gave claritin.  No chest pain, no leg swelling.  No fever/chills.  Cough is productive of sputum.  There are no other associated systemic symptoms, there are no other alleviating or modifying factors.   Past Medical History:  Diagnosis Date  . Bradycardia    a. Amiodarone DC'd 12/16 >> b. Holter 1/17 - NSR, sinus brady, junctional rhythm, pause 2.4 s  . C. difficile colitis 2015  . CAD (coronary artery disease)    never had cardiac cath, CAD dx by positive ant. wall defect on Nuc. study 2007 >>  no cath due to high risk/advanced age  . Chronic diastolic CHF (congestive heart failure) ([place])    a. Echo 6/15 - Mild LVH, EF 55-60%, mild AI, mild MR, moderate LAE, mild RAE, PASP 52 mm Hg  . CKD (chronic kidney disease)   . COPD (chronic obstructive pulmonary disease) ([place])   . Diverticulitis   . GERD (gastroesophageal reflux disease)   . Hypertension   . Macular degeneration   . PAF (paroxysmal atrial fibrillation) ([place])    not optimal candidate for anticoagulation    Patient Active Problem List   Diagnosis Date Noted  . Sinus node dysfunction ([place]) 05/05/2015  . Bradycardia 09/14/2014  . CKD (chronic kidney disease) 09/14/2014  . GERD (gastroesophageal reflux disease) 10/29/2013  . Hyponatremia 10/21/2013  . Acute blood loss anemia 10/01/2013  . Urinary retention 10/01/2013  . Chronic  diastolic heart failure ([place]) 09/28/2013  . PAF (paroxysmal atrial fibrillation) ([place]) 09/27/2013  . C. difficile colitis 09/22/2013  . Melena 09/22/2013  . ATN (acute tubular necrosis) ([place]) 09/21/2013  . Nonspecific abnormal unspecified cardiovascular function study 06/11/2013  . Syncope 06/28/2011  . Swelling of left lower extremity 06/28/2011  . COPD (chronic obstructive pulmonary disease) ([place]) 06/28/2011  . DEMENTIA, CCE, W/O BEHAVIORAL DISTURBANCE 01/17/2007  . RENAL STONE 12/04/2006  . HLD (hyperlipidemia) 07/15/2006  . DEPRESSION 07/15/2006  . BLINDNESS, LEFT EYE 07/15/2006  . Essential hypertension 07/15/2006  . CAD (coronary artery disease) 07/15/2006  . OSTEOPOROSIS 07/15/2006    Past Surgical History:  Procedure Laterality Date  . APPENDECTOMY    . CHOLECYSTECTOMY    . EP IMPLANTABLE DEVICE N/A 05/17/2015   Procedure: Pacemaker Implant;  Surgeon: Evans Lance, MD;  Location: [facility] LAB;  Service: Cardiovascular;  Laterality: N/A;  . ESOPHAGOGASTRODUODENOSCOPY N/A 09/22/2013   Procedure: ESOPHAGOGASTRODUODENOSCOPY (EGD);  Surgeon: Lear Ng, MD;  Location: [facility] ENDOSCOPY;  Service: Endoscopy;  Laterality: N/A;  . ESOPHAGOGASTRODUODENOSCOPY N/A 09/22/2013   Procedure: ESOPHAGOGASTRODUODENOSCOPY (EGD);  Surgeon: Lear Ng, MD;  Location: [facility] ENDOSCOPY;  Service: Endoscopy;  Laterality: N/A;  . EYE SURGERY    . FLEXIBLE SIGMOIDOSCOPY N/A 09/22/2013   Procedure: FLEXIBLE SIGMOIDOSCOPY;  Surgeon: Lear Ng, MD;  Location:  [facility] ENDOSCOPY;  Service: Endoscopy;  Laterality: N/A;  . FLEXIBLE SIGMOIDOSCOPY N/A 09/22/2013   Procedure: FLEXIBLE SIGMOIDOSCOPY;  Surgeon: Lear Ng, MD;  Location: [facility] ENDOSCOPY;  Service: Endoscopy;  Laterality: N/A;  unprepped/ egd first  . VAGINAL HYSTERECTOMY      OB History    No data available       Home Medications    Prior to Admission medications   Medication Sig Start Date End Date Taking?  Authorizing Provider  acetaminophen (TYLENOL) 500 MG tablet Take 500-1,000 mg by mouth every 6 (six) hours as needed for moderate pain or headache.    Historical Provider, MD  albuterol (PROVENTIL HFA;VENTOLIN HFA) 108 (90 BASE) MCG/ACT inhaler Inhale 2 puffs into the lungs every 6 (six) hours as needed for wheezing or shortness of breath. 02/23/15   Deloria Lair, NP  albuterol (PROVENTIL) (2.5 MG/3ML) 0.083% nebulizer solution Take 3 mLs (2.5 mg total) by nebulization every 6 (six) hours as needed for wheezing or shortness of breath. 12/28/15   Alfonzo Beers, MD  amiodarone (PACERONE) 100 MG tablet Take 1 tablet (100 mg total) by mouth daily. 05/18/15   Amber Sena Slate, NP  calcium-vitamin D (OSCAL WITH D) 500-200 MG-UNIT per tablet Take 1 tablet by mouth daily after lunch.     Historical Provider, MD  clopidogrel (PLAVIX) 75 MG tablet TAKE ONE TABLET BY MOUTH ONCE DAILY 03/22/14   Jettie Booze, MD  Cranberry 250 MG CAPS Take 250 mg by mouth daily after lunch.     Historical Provider, MD  Dietary Management Product (TOZAL) CAPS Take 1 capsule by mouth 2 (two) times daily.    Historical Provider, MD  escitalopram (LEXAPRO) 10 MG tablet Take 10 mg by mouth daily.     Historical Provider, MD  ferrous sulfate 325 (65 FE) MG tablet Take 325 mg by mouth daily after lunch.     Historical Provider, MD  furosemide (LASIX) 40 MG tablet TAKE TWO TABLETS BY MOUTH IN THE MORNING, THEN TAKE ONE IN THE EVENING, YOU CAN TAKE AN EXTRA 40 MG IN THE EVENING IF NEEDED 05/13/14   Jettie Booze, MD  hydrALAZINE (APRESOLINE) 25 MG tablet Take 1 tablet (25 mg total) by mouth 2 (two) times daily. 10/17/15   Jettie Booze, MD  isosorbide mononitrate (IMDUR) 60 MG 24 hr tablet Take 1 tablet (60 mg total) by mouth daily. 02/21/15   Jettie Booze, MD  levothyroxine (SYNTHROID, LEVOTHROID) 50 MCG tablet Take 50 mcg by mouth daily before breakfast.  06/04/14   Historical Provider, MD  Multiple Vitamins-Minerals  (HAIR/SKIN/NAILS/BIOTIN) TABS Take 1 tablet by mouth daily after lunch.    Historical Provider, MD  pantoprazole (PROTONIX) 40 MG tablet Take 40 mg by mouth daily after lunch.     Historical Provider, MD  polyethylene glycol (MIRALAX / GLYCOLAX) packet Take 17 g by mouth daily as needed (constipation). Mix in 8 oz juice and drink    Historical Provider, MD  potassium chloride SA (K-DUR,KLOR-CON) 20 MEQ tablet Take 20 mEq by mouth 2 (two) times daily.  05/20/14   Historical Provider, MD  pravastatin (PRAVACHOL) 40 MG tablet Take 40 mg by mouth at bedtime.     Historical Provider, MD  predniSONE (DELTASONE) 50 MG tablet Take 1 tablet (50 mg total) by mouth daily. 12/28/15   Alfonzo Beers, MD  Tetrahydrozoline HCl (VISINE OP) Place 1 drop into both eyes daily as needed (itching).    Historical Provider, MD  vitamin C (ASCORBIC  ACID) 500 MG tablet Take 500 mg by mouth daily after lunch.     Historical Provider, MD  vitamin E 400 UNIT capsule Take 400 Units by mouth daily after lunch.    Historical Provider, MD    Family History Family History  Problem Relation Age of Onset  . Heart attack Father 79  . Heart attack Sister   . Cancer Brother   . Cancer Brother   . Cirrhosis Brother   . Heart disease Brother   . Cancer Brother   . COPD Brother   . COPD Sister   . Cancer Sister   . Cancer Sister   . Hypertension Daughter   . Stroke Neg Hx     Social History Social History  Substance Use Topics  . Smoking status: Never Smoker  . Smokeless tobacco: Never Used  . Alcohol use No     Allergies   Penicillins; 2,4-d dimethylamine (amisol); Ace inhibitors; Lanolin; and Sulfonamide derivatives   Review of Systems Review of Systems ROS reviewed and all otherwise negative except for mentioned in HPI  Physical Exam Updated Vital Signs BP (!) 122/44   Pulse 60   Temp 97.7 F (36.5 C) (Oral)   Resp 20   Ht 5\' 1"  (1.549 m)   Wt 61.7 kg   SpO2 97%   BMI 25.70 kg/m  Vitals  reviewed Physical Exam Physical Examination: General appearance - alert, well appearing, and in no distress Mental status - alert, oriented to person, place, and time Eyes - no conjunctival injection, no scleral icterus Mouth - mucous membranes moist, pharynx normal without lesions Neck - supple, no significant adenopathy Chest - BSS, mild expiratory wheezing bilaterally, normal respiratory effort, no crackles Heart - normal rate, regular rhythm, normal S1, S2, no murmurs, rubs, clicks or gallops Abdomen - soft, nontender, nondistended, no masses or organomegaly Neurological - alert, oriented, normal speech Extremities - peripheral pulses normal, no pedal edema, no clubbing or cyanosis Skin - normal coloration and turgor, no rashes  ED Treatments / Results  Labs (all labs ordered are listed, but only abnormal results are displayed) Labs Reviewed  CBC - Abnormal; Notable for the following:       Result Value   RBC 3.63 (*)    MCV 103.0 (*)    All other components within normal limits  BASIC METABOLIC PANEL - Abnormal; Notable for the following:    CO2 33 (*)    Glucose, Bld 112 (*)    Creatinine, Ser 1.16 (*)    GFR calc non Af Amer 38 (*)    GFR calc Af Amer 45 (*)    All other components within normal limits  BRAIN NATRIURETIC PEPTIDE - Abnormal; Notable for the following:    B Natriuretic Peptide 123.8 (*)    All other components within normal limits    EKG  EKG Interpretation  Date/Time:  Wednesday December 28 2015 20:58:10 EDT Ventricular Rate:  60 PR Interval:    QRS Duration: 74 QT Interval:  598 QTC Calculation: 598 R Axis:   -23 Text Interpretation:  Sinus or ectopic atrial rhythm Borderline left axis deviation Anterior infarct, age indeterminate Prolonged QT interval paced rhythm, pacer spikes less evident than prior Confirmed by Canary Brim  MD, Moncia Annas [id number]) on 12/28/2015 9:23:32 PM       Radiology Dg Chest 2 View  Result Date: 12/28/2015 CLINICAL DATA:   Chest pain and shortness of breath for 1 week. EXAM: CHEST  2 VIEW COMPARISON:  Chest x-ray  07/05/2015 and chest CT 08/30/2015 FINDINGS: The heart is mildly enlarged but stable. There is tortuosity and calcification of the thoracic aorta. The current partial wires are in good position without complicating features. Chronic emphysematous changes and bronchitic type interstitial changes. No definite infiltrates or effusions. The bony thorax is intact. IMPRESSION: No acute cardiopulmonary findings. Chronic bronchitic type interstitial lung changes. Stable cardiac enlargement and stable aortic atherosclerosis and tortuosity. Electronically Signed   By: Marijo Sanes M.D.   On: 12/28/2015 22:01    Procedures Procedures (including critical care time)  Medications Ordered in ED Medications  albuterol (PROVENTIL) (2.5 MG/3ML) 0.083% nebulizer solution 2.5 mg (2.5 mg Nebulization Given 12/28/15 2224)  predniSONE (DELTASONE) tablet 60 mg (60 mg Oral Given 12/28/15 2224)     Initial Impression / Assessment and Plan / ED Course  I have reviewed the triage vital signs and the nursing notes.  Pertinent labs & imaging results that were available during my care of the patient were reviewed by me and considered in my medical decision making (see chart for details).  Clinical Course   9:33 PM went to see patient, she is in xray Pt presenting with wheezing and shortness of breath.  Has been using albuterol at home.  Mild wheezing on exam after albuterol via EMS.  Xray is c/w copd- no pneumonai of pulmonary edema.  Labs are reassuring as well as EKG.  Doubt acs or CHF .  Pt started on prednisone.  Advised to use albuterol every 4 hours.  Discharged with strict return precautions.  Pt agreeable with plan.  Final Clinical Impressions(s) / ED Diagnoses   Final diagnoses:  COPD exacerbation ([place])    New Prescriptions Discharge Medication List as of 12/28/2015 11:45 PM    START taking these medications    Details  predniSONE (DELTASONE) 50 MG tablet Take 1 tablet (50 mg total) by mouth daily., Starting Wed 12/28/2015, Print         Alfonzo Beers, MD 12/29/15 [phone]

## 2015-12-28 NOTE — Telephone Encounter (Signed)
New message    Pt c/o Shortness Of Breath: STAT if SOB developed within the last 24 hours or pt is noticeably SOB on the phone  1. Are you currently SOB (can you hear that pt is SOB on the phone)? No, pt was not on the phone, spoke to the dtr  2. How long have you been experiencing SOB? A week   3. Are you SOB when sitting or when up moving around? Moving around  4. Are you currently experiencing any other symptoms? wheezing   Pts dtr states that upon being check at the doc the pts lungs sounded clear. Pts dtr wants her to be seen today anything after 4pm or tomorrow if possible. Offered appt w/Lori on 9/29@ 3pm. Pts dtr declined. Please call.

## 2015-12-28 NOTE — ED Notes (Signed)
Patient transported to X-ray 

## 2015-12-28 NOTE — Telephone Encounter (Signed)
Will have Melissa call the patient's daughter and schedule her for [facility] tomorrow.

## 2015-12-28 NOTE — ED Triage Notes (Signed)
Pt from home with SOB and Cough for about 2 weeks pt has been to family doctor with no change. Pt was given 2.5 albuterol with EMS with help of weezing

## 2015-12-28 NOTE — Discharge Instructions (Signed)
Return to the ED with any concerns including difficulty breathing despite using albuterol every 4 hours, not drinking fluids, decreased urine output, vomiting and not able to keep down liquids or medications, decreased level of alertness/lethargy, or any other alarming symptoms °

## 2015-12-29 ENCOUNTER — Encounter: Payer: PPO | Admitting: Physician Assistant

## 2015-12-30 ENCOUNTER — Encounter: Payer: Self-pay | Admitting: Interventional Cardiology

## 2016-01-02 ENCOUNTER — Ambulatory Visit: Payer: PPO | Admitting: Interventional Cardiology

## 2016-01-04 DIAGNOSIS — K219 Gastro-esophageal reflux disease without esophagitis: Secondary | ICD-10-CM | POA: Diagnosis not present

## 2016-01-04 DIAGNOSIS — J449 Chronic obstructive pulmonary disease, unspecified: Secondary | ICD-10-CM | POA: Diagnosis not present

## 2016-01-04 DIAGNOSIS — R531 Weakness: Secondary | ICD-10-CM | POA: Diagnosis not present

## 2016-01-04 DIAGNOSIS — I5032 Chronic diastolic (congestive) heart failure: Secondary | ICD-10-CM | POA: Diagnosis not present

## 2016-01-04 DIAGNOSIS — R05 Cough: Secondary | ICD-10-CM | POA: Diagnosis not present

## 2016-01-09 ENCOUNTER — Other Ambulatory Visit: Payer: Self-pay | Admitting: Nurse Practitioner

## 2016-01-09 DIAGNOSIS — R531 Weakness: Secondary | ICD-10-CM | POA: Diagnosis not present

## 2016-01-10 ENCOUNTER — Other Ambulatory Visit: Payer: Self-pay

## 2016-01-10 MED ORDER — AMIODARONE HCL 100 MG PO TABS: 100.0000 mg | ORAL_TABLET | Freq: Every day | ORAL | 6 refills | Status: DC

## 2016-01-13 ENCOUNTER — Ambulatory Visit
Admission: RE | Admit: 2016-01-13 | Discharge: 2016-01-13 | Disposition: A | Discharge status: Home / Self Care | Payer: PPO | Source: Ambulatory Visit | Attending: Internal Medicine | Admitting: Internal Medicine

## 2016-01-13 ENCOUNTER — Other Ambulatory Visit: Payer: Self-pay | Admitting: Internal Medicine

## 2016-01-13 DIAGNOSIS — R059 Cough, unspecified: Secondary | ICD-10-CM

## 2016-01-13 DIAGNOSIS — R5081 Fever presenting with conditions classified elsewhere: Secondary | ICD-10-CM | POA: Diagnosis not present

## 2016-01-13 DIAGNOSIS — R05 Cough: Secondary | ICD-10-CM

## 2016-01-13 DIAGNOSIS — R0602 Shortness of breath: Secondary | ICD-10-CM | POA: Diagnosis not present

## 2016-01-13 DIAGNOSIS — B370 Candidal stomatitis: Secondary | ICD-10-CM | POA: Diagnosis not present

## 2016-01-18 ENCOUNTER — Other Ambulatory Visit: Payer: Self-pay | Admitting: Internal Medicine

## 2016-01-18 DIAGNOSIS — J42 Unspecified chronic bronchitis: Secondary | ICD-10-CM

## 2016-01-18 DIAGNOSIS — R05 Cough: Secondary | ICD-10-CM | POA: Diagnosis not present

## 2016-01-18 DIAGNOSIS — R059 Cough, unspecified: Secondary | ICD-10-CM

## 2016-01-19 ENCOUNTER — Other Ambulatory Visit: Payer: Self-pay | Admitting: Internal Medicine

## 2016-01-19 ENCOUNTER — Ambulatory Visit
Admission: RE | Admit: 2016-01-19 | Discharge: 2016-01-19 | Disposition: A | Discharge status: Home / Self Care | Payer: PPO | Source: Ambulatory Visit | Attending: Internal Medicine | Admitting: Internal Medicine

## 2016-01-19 DIAGNOSIS — R059 Cough, unspecified: Secondary | ICD-10-CM

## 2016-01-19 DIAGNOSIS — R05 Cough: Secondary | ICD-10-CM

## 2016-01-19 DIAGNOSIS — R079 Chest pain, unspecified: Secondary | ICD-10-CM | POA: Diagnosis not present

## 2016-01-19 DIAGNOSIS — J42 Unspecified chronic bronchitis: Secondary | ICD-10-CM

## 2016-01-20 DIAGNOSIS — J849 Interstitial pulmonary disease, unspecified: Secondary | ICD-10-CM | POA: Diagnosis not present

## 2016-01-20 DIAGNOSIS — R05 Cough: Secondary | ICD-10-CM | POA: Diagnosis not present

## 2016-01-20 DIAGNOSIS — J42 Unspecified chronic bronchitis: Secondary | ICD-10-CM | POA: Diagnosis not present

## 2016-02-15 NOTE — Progress Notes (Signed)
Cardiology Office Note   Date:  02/16/2016   ID:  Laura Zuniga, DOB Mar 19, 1918, MRN [id number]  PCP:  Wenda Low, MD    Chief Complaint  Patient presents with  . Congestive Heart Failure   F/u pacer  Wt Readings from Last 3 Encounters:  02/16/16 67 kg (147 lb 12.8 oz)  12/28/15 61.7 kg (136 lb)  08/29/15 67 kg (147 lb 9.6 oz)       History of Present Illness: Laura Zuniga is a 82 y.o. female  with a hx of presumed CAD (anterior wall abnormality on nuclear imaging in [id number]), diastolic CHF, atrial fibrillation, COPD, HTN.  She spends part of her year in [place] and part in [place].  Patient was admitted 6/15 with C. Difficile colitis complicated by atrial fibrillation with RVR. She was placed on amiodarone. She was not felt to be a good candidate for anticoagulation. She converted to NSR. She was then readmitted 6/16 with acute on chronic diastolic CHF. She was diuresed with IV Lasix and d/c back to SNF. FU Holter in 8/15 with NSR, occ junctional rhythm, lowest HR 48; No AFib.    Seen in 12/16 to evaluate bradycardia.  She was back in AF with SVR (HR in 40s).  She did have orthostatic intol but Ortho VS were ok.  Case was reviewed with Dr. Virl Axe and she was taken off of Amiodarone.  Holter monitor was placed NSR, sinus brady, junctional rhythm and longest pause 2.4 seconds.   She subsequently had symptomatic bradycardia with a pacer placed.   She had a respiratory infection a few months ago.  She is seeing pulmonary tomorrow.  She walks in the house. She rarely goes out for any type of exercise. She feels that she is getting along okay. No chest pain when she walks. No trouble lying flat. She sleeps well on 2 pillows.  She does have some mild dizziness when she stands quickly. She is more careful to change positions gradually now.   Past Medical History:  Diagnosis Date  . Bradycardia    a. Amiodarone DC'd 12/16 >> b. Holter 1/17 - NSR, sinus brady, junctional  rhythm, pause 2.4 s  . C. difficile colitis 2015  . CAD (coronary artery disease)    never had cardiac cath, CAD dx by positive ant. wall defect on Nuc. study 2007 >>  no cath due to high risk/advanced age  . Chronic diastolic CHF (congestive heart failure) ([place])    a. Echo 6/15 - Mild LVH, EF 55-60%, mild AI, mild MR, moderate LAE, mild RAE, PASP 52 mm Hg  . CKD (chronic kidney disease)   . COPD (chronic obstructive pulmonary disease) ([place])   . Diverticulitis   . GERD (gastroesophageal reflux disease)   . Hypertension   . Macular degeneration   . PAF (paroxysmal atrial fibrillation) ([facility])    not optimal candidate for anticoagulation    Past Surgical History:  Procedure Laterality Date  . APPENDECTOMY    . CHOLECYSTECTOMY    . EP IMPLANTABLE DEVICE N/A 05/17/2015   Procedure: Pacemaker Implant;  Surgeon: Evans Lance, MD;  Location: [place] CV LAB;  Service: Cardiovascular;  Laterality: N/A;  . ESOPHAGOGASTRODUODENOSCOPY N/A 09/22/2013   Procedure: ESOPHAGOGASTRODUODENOSCOPY (EGD);  Surgeon: Lear Ng, MD;  Location: [facility] ENDOSCOPY;  Service: Endoscopy;  Laterality: N/A;  . ESOPHAGOGASTRODUODENOSCOPY N/A 09/22/2013   Procedure: ESOPHAGOGASTRODUODENOSCOPY (EGD);  Surgeon: Lear Ng, MD;  Location: [facility] ENDOSCOPY;  Service: Endoscopy;  Laterality: N/A;  .  EYE SURGERY    . FLEXIBLE SIGMOIDOSCOPY N/A 09/22/2013   Procedure: FLEXIBLE SIGMOIDOSCOPY;  Surgeon: Lear Ng, MD;  Location: [facility] ENDOSCOPY;  Service: Endoscopy;  Laterality: N/A;  . FLEXIBLE SIGMOIDOSCOPY N/A 09/22/2013   Procedure: FLEXIBLE SIGMOIDOSCOPY;  Surgeon: Lear Ng, MD;  Location: [facility] ENDOSCOPY;  Service: Endoscopy;  Laterality: N/A;  unprepped/ egd first  . VAGINAL HYSTERECTOMY       Current Outpatient Prescriptions  Medication Sig Dispense Refill  . acetaminophen (TYLENOL) 500 MG tablet Take 500-1,000 mg by mouth every 6 (six) hours as needed for moderate pain or headache.    .  albuterol (PROVENTIL HFA;VENTOLIN HFA) 108 (90 BASE) MCG/ACT inhaler Inhale 2 puffs into the lungs every 6 (six) hours as needed for wheezing or shortness of breath. 1 Inhaler 3  . albuterol (PROVENTIL) (2.5 MG/3ML) 0.083% nebulizer solution Take 3 mLs (2.5 mg total) by nebulization every 6 (six) hours as needed for wheezing or shortness of breath. 75 mL 12  . amiodarone (PACERONE) 100 MG tablet Take 1 tablet (100 mg total) by mouth daily. 30 tablet 6  . calcium-vitamin D (OSCAL WITH D) 500-200 MG-UNIT per tablet Take 1 tablet by mouth daily after lunch.     . clopidogrel (PLAVIX) 75 MG tablet TAKE ONE TABLET BY MOUTH ONCE DAILY 30 tablet 3  . Cranberry 250 MG CAPS Take 250 mg by mouth daily after lunch.     . Dietary Management Product (TOZAL) CAPS Take 1 capsule by mouth 2 (two) times daily.    Marland Kitchen escitalopram (LEXAPRO) 10 MG tablet Take 10 mg by mouth daily.     . ferrous sulfate 325 (65 FE) MG tablet Take 325 mg by mouth daily after lunch.     . furosemide (LASIX) 40 MG tablet TAKE TWO TABLETS BY MOUTH IN THE MORNING, THEN TAKE ONE IN THE EVENING, YOU CAN TAKE AN EXTRA 40 MG IN THE EVENING IF NEEDED 90 tablet 6  . hydrALAZINE (APRESOLINE) 25 MG tablet Take 1 tablet (25 mg total) by mouth 2 (two) times daily. 180 tablet 3  . isosorbide mononitrate (IMDUR) 60 MG 24 hr tablet Take 1 tablet (60 mg total) by mouth daily. 90 tablet 5  . levothyroxine (SYNTHROID, LEVOTHROID) 50 MCG tablet Take 50 mcg by mouth daily before breakfast.     . Multiple Vitamins-Minerals (HAIR/SKIN/NAILS/BIOTIN) TABS Take 1 tablet by mouth daily after lunch.    . pantoprazole (PROTONIX) 40 MG tablet Take 40 mg by mouth daily after lunch.     . polyethylene glycol (MIRALAX / GLYCOLAX) packet Take 17 g by mouth daily as needed (constipation). Mix in 8 oz juice and drink    . potassium chloride SA (K-DUR,KLOR-CON) 20 MEQ tablet Take 20 mEq by mouth 2 (two) times daily.     . pravastatin (PRAVACHOL) 40 MG tablet Take 40 mg by  mouth at bedtime.     . Tetrahydrozoline HCl (VISINE OP) Place 1 drop into both eyes daily as needed (itching).    . vitamin C (ASCORBIC ACID) 500 MG tablet Take 500 mg by mouth daily after lunch.     . vitamin E 400 UNIT capsule Take 400 Units by mouth daily after lunch.     No current facility-administered medications for this visit.     Allergies:   Penicillins; 2,4-d dimethylamine (amisol); Ace inhibitors; Lanolin; and Sulfonamide derivatives    Social History:  The patient  reports that she has never smoked. She has never used smokeless tobacco. She  reports that she does not drink alcohol or use drugs.   Family History:  The patient's family history includes COPD in her brother and sister; Cancer in her brother, brother, brother, sister, and sister; Cirrhosis in her brother; Heart attack in her sister; Heart attack (age of onset: 69) in her father; Heart disease in her brother; Hypertension in her daughter.    ROS:  Please see the history of present illness.   Otherwise, review of systems are positive for mild dizziness with standing.   All other systems are reviewed and negative.    PHYSICAL EXAM: VS:  BP 130/60   Pulse 63   Ht 5' (1.524 m)   Wt 67 kg (147 lb 12.8 oz)   SpO2 97%   BMI 28.87 kg/m  , BMI Body mass index is 28.87 kg/m. GEN: Well nourished, well developed, frail, in no acute distress  HEENT: normal  Neck: no JVD, carotid bruits, or masses Cardiac: RRR; no murmurs, rubs, or gallops,no edema  Respiratory:  clear to auscultation bilaterally, normal work of breathing GI: soft, nontender, nondistended, + BS MS: no deformity or atrophy  Skin: warm and dry, no rash Neuro:  Strength and sensation are intact Psych: euthymic mood, full affect    Recent Labs: 03/25/2015: TSH 4.217 12/28/2015: B Natriuretic Peptide 123.8; BUN 12; Creatinine, Ser 1.16; Hemoglobin 12.2; Platelets 189; Potassium 4.0; Sodium 141   Lipid Panel    Component Value Date/Time   CHOL 143  06/28/2011 2230   TRIG 177 (H) 06/28/2011 2230   HDL 38 (L) 06/28/2011 2230   CHOLHDL 3.8 06/28/2011 2230   VLDL 35 06/28/2011 2230   LDLCALC 70 06/28/2011 2230     Other studies Reviewed: Additional studies/ records that were reviewed today with results demonstrating: stress test results: Large anterior wall defect noted in 2007.   ASSESSMENT AND PLAN:  1. CAD: COntinue Plavix and nitrates.  Angina controlled on medical therapy.  2. Chronic diastolic heart failure.  Appears euvolemic.  3. Pacer/PAF:  Would not anticoagulate due to age and frequent falls.  4. HTN: BP well controlled.    Current medicines are reviewed at length with the patient today.  The patient concerns regarding her medicines were addressed.  The following changes have been made:  No change  Labs/ tests ordered today include:  No orders of the defined types were placed in this encounter.   Recommend 150 minutes/week of aerobic exercise Low fat, low carb, high fiber diet recommended  Disposition:   FU in 1 year   Signed, Larae Grooms, MD  02/16/2016 1:47 PM    [facility] [address], [place], [place]  [address] Phone: [phone]; Fax: [phone]

## 2016-02-16 ENCOUNTER — Ambulatory Visit (INDEPENDENT_AMBULATORY_CARE_PROVIDER_SITE_OTHER): Payer: PPO | Admitting: Interventional Cardiology

## 2016-02-16 ENCOUNTER — Encounter: Payer: Self-pay | Admitting: Interventional Cardiology

## 2016-02-16 VITALS — BP 130/60 | HR 63 | Ht 60.0 in | Wt 147.8 lb

## 2016-02-16 DIAGNOSIS — I5032 Chronic diastolic (congestive) heart failure: Secondary | ICD-10-CM | POA: Diagnosis not present

## 2016-02-16 DIAGNOSIS — Z950 Presence of cardiac pacemaker: Secondary | ICD-10-CM | POA: Diagnosis not present

## 2016-02-16 DIAGNOSIS — I25119 Atherosclerotic heart disease of native coronary artery with unspecified angina pectoris: Secondary | ICD-10-CM

## 2016-02-16 DIAGNOSIS — Z23 Encounter for immunization: Secondary | ICD-10-CM | POA: Diagnosis not present

## 2016-02-16 DIAGNOSIS — I10 Essential (primary) hypertension: Secondary | ICD-10-CM

## 2016-02-16 NOTE — Patient Instructions (Signed)
**Note De-identified  Obfuscation** Medication Instructions:  Same-no changes  Labwork: None  Testing/Procedures: None  Follow-Up: Your physician wants you to follow-up in: 1 year. You will receive a reminder letter in the mail two months in advance. If you don't receive a letter, please call our office to schedule the follow-up appointment.      If you need a refill on your cardiac medications before your next appointment, please call your pharmacy.   

## 2016-02-17 ENCOUNTER — Ambulatory Visit (INDEPENDENT_AMBULATORY_CARE_PROVIDER_SITE_OTHER): Payer: PPO | Admitting: Emergency Medicine

## 2016-02-17 ENCOUNTER — Encounter: Payer: Self-pay | Admitting: Emergency Medicine

## 2016-02-17 ENCOUNTER — Other Ambulatory Visit (HOSPITAL_COMMUNITY): Payer: Self-pay | Admitting: Emergency Medicine

## 2016-02-17 VITALS — BP 126/62 | HR 70 | Ht 60.0 in | Wt 149.0 lb

## 2016-02-17 DIAGNOSIS — R0600 Dyspnea, unspecified: Secondary | ICD-10-CM

## 2016-02-17 DIAGNOSIS — R05 Cough: Secondary | ICD-10-CM

## 2016-02-17 DIAGNOSIS — R053 Chronic cough: Secondary | ICD-10-CM

## 2016-02-17 DIAGNOSIS — R1310 Dysphagia, unspecified: Secondary | ICD-10-CM

## 2016-02-17 DIAGNOSIS — J849 Interstitial pulmonary disease, unspecified: Secondary | ICD-10-CM | POA: Insufficient documentation

## 2016-02-17 MED ORDER — FLUTICASONE FUROATE-VILANTEROL 100-25 MCG/INH IN AEPB: 1.0000 | INHALATION_SPRAY | Freq: Every day | RESPIRATORY_TRACT | 5 refills | Status: DC

## 2016-02-17 NOTE — Assessment & Plan Note (Signed)
Mild basilar interstitial infiltrates that appear to be long-standing based on serial scans. I think this is most likely related to chronic aspiration given her description of her swallowing problems. I doubt that this is amiodarone toxicity. We will perform a swallowing evaluation/modified barium swallow. I will also follow her clinically and determine the timing of any repeat CT scan or chest x-ray. I do not believe she needs to stop amiodarone at this time.

## 2016-02-17 NOTE — Progress Notes (Signed)
Subjective:    Patient ID: Laura Zuniga, female    DOB: 1917-11-22, 82 y.o.   MRN: [id number]  HPI 82 year old woman, never smoker who carries a diagnosis of COPD this was made. She also has presumed coronary disease based on nuclear stress imaging, hypertension with associated diastolic CHF, rib relation. She was on amiodarone in June 2015 after being admitted with C. difficile colitis complicated by atrial fibrillation with RVR. She has also had admissions for diastolic CHF, bradycardia arrhythmias with associated orthostasis, decreased amiodarone and pacemaker placed.  She has been evaluated for chronic and recurrent episodes of cough. She underwent a CT scan of the chest 01/20/16 that I personally reviewed. This shows some very mild basilar interstitial changes but no evidence of acute amiodarone toxicity.   She was treated with abx x 2, cough medicines end of September into October in setting apparent URI. She has baseline exertional dyspnea. No wheeze. Her cough is back to baseline. The dyspnea goes back for a few years. When she swallows she gets some odynophagia. She gets "strangled" on water. She has been on albuterol nebs before, was recently started on Breo in October and she thinks that it has helped her. She has some heartburn sx. No real congestion, allergy sx.  ? Whether she had an aspiration pneumonitis / PNA in Sept / Oct.    Review of Systems  Constitutional: Negative for fever and unexpected weight change.  HENT: Negative for congestion, dental problem, ear pain, nosebleeds, postnasal drip, rhinorrhea, sinus pressure, sneezing, sore throat and trouble swallowing.   Eyes: Negative for redness and itching.  Respiratory: Positive for cough, shortness of breath and wheezing. Negative for chest tightness.   Cardiovascular: Negative for palpitations and leg swelling.  Gastrointestinal: Negative for nausea and vomiting.  Genitourinary: Negative for dysuria.  Musculoskeletal:  Negative for joint swelling.  Skin: Negative for rash.  Neurological: Negative for headaches.  Hematological: Does not bruise/bleed easily.  Psychiatric/Behavioral: Negative for dysphoric mood. The patient is not nervous/anxious.     Past Medical History:  Diagnosis Date  . Bradycardia    a. Amiodarone DC'd 12/16 >> b. Holter 1/17 - NSR, sinus brady, junctional rhythm, pause 2.4 s  . C. difficile colitis 2015  . CAD (coronary artery disease)    never had cardiac cath, CAD dx by positive ant. wall defect on Nuc. study 2007 >>  no cath due to high risk/advanced age  . Chronic diastolic CHF (congestive heart failure) ([place])    a. Echo 6/15 - Mild LVH, EF 55-60%, mild AI, mild MR, moderate LAE, mild RAE, PASP 52 mm Hg  . CKD (chronic kidney disease)   . COPD (chronic obstructive pulmonary disease) ([place])   . Diverticulitis   . GERD (gastroesophageal reflux disease)   . Hypertension   . Macular degeneration   . PAF (paroxysmal atrial fibrillation) ([facility])    not optimal candidate for anticoagulation     Family History  Problem Relation Age of Onset  . Heart attack Father 57  . Heart attack Sister   . Cancer Brother   . Cancer Brother   . Cirrhosis Brother   . Heart disease Brother   . Cancer Brother   . COPD Brother   . COPD Sister   . Cancer Sister   . Cancer Sister   . Hypertension Daughter   . Stroke Neg Hx      Social History   Social History  . Marital status: Widowed    Spouse  name: N/A  . Number of children: N/A  . Years of education: N/A   Occupational History  . Not on file.   Social History Main Topics  . Smoking status: Never Smoker  . Smokeless tobacco: Never Used  . Alcohol use No  . Drug use: No  . Sexual activity: No   Other Topics Concern  . Not on file   Social History Narrative        father died age 19: [place] MI         mother died age 46: gallbladder surgery complications         4 brothers: all deceased; prostate ca, throat ca,  emphesema, MI, cirrhosis from ETOH         3 sisters: one living: MI, emphesema, 2 sisters with breast CA     Allergies  Allergen Reactions  . Penicillins Other (See Comments)    Unknown allergic reaction  . 2,4-D Dimethylamine (Amisol) Other (See Comments)  . Ace Inhibitors Other (See Comments)  . Lanolin Hives  . Sulfonamide Derivatives Swelling and Rash     Outpatient Medications Prior to Visit  Medication Sig Dispense Refill  . acetaminophen (TYLENOL) 500 MG tablet Take 500-1,000 mg by mouth every 6 (six) hours as needed for moderate pain or headache.    . albuterol (PROVENTIL HFA;VENTOLIN HFA) 108 (90 BASE) MCG/ACT inhaler Inhale 2 puffs into the lungs every 6 (six) hours as needed for wheezing or shortness of breath. 1 Inhaler 3  . albuterol (PROVENTIL) (2.5 MG/3ML) 0.083% nebulizer solution Take 3 mLs (2.5 mg total) by nebulization every 6 (six) hours as needed for wheezing or shortness of breath. 75 mL 12  . amiodarone (PACERONE) 100 MG tablet Take 1 tablet (100 mg total) by mouth daily. 30 tablet 6  . calcium-vitamin D (OSCAL WITH D) 500-200 MG-UNIT per tablet Take 1 tablet by mouth daily after lunch.     . clopidogrel (PLAVIX) 75 MG tablet TAKE ONE TABLET BY MOUTH ONCE DAILY 30 tablet 3  . Cranberry 250 MG CAPS Take 250 mg by mouth daily after lunch.     . Dietary Management Product (TOZAL) CAPS Take 1 capsule by mouth 2 (two) times daily.    Marland Kitchen escitalopram (LEXAPRO) 10 MG tablet Take 10 mg by mouth daily.     . ferrous sulfate 325 (65 FE) MG tablet Take 325 mg by mouth daily after lunch.     . furosemide (LASIX) 40 MG tablet TAKE TWO TABLETS BY MOUTH IN THE MORNING, THEN TAKE ONE IN THE EVENING, YOU CAN TAKE AN EXTRA 40 MG IN THE EVENING IF NEEDED 90 tablet 6  . hydrALAZINE (APRESOLINE) 25 MG tablet Take 1 tablet (25 mg total) by mouth 2 (two) times daily. 180 tablet 3  . isosorbide mononitrate (IMDUR) 60 MG 24 hr tablet Take 1 tablet (60 mg total) by mouth daily. 90 tablet 5    . levothyroxine (SYNTHROID, LEVOTHROID) 50 MCG tablet Take 50 mcg by mouth daily before breakfast.     . Multiple Vitamins-Minerals (HAIR/SKIN/NAILS/BIOTIN) TABS Take 1 tablet by mouth daily after lunch.    . pantoprazole (PROTONIX) 40 MG tablet Take 40 mg by mouth daily after lunch.     . polyethylene glycol (MIRALAX / GLYCOLAX) packet Take 17 g by mouth daily as needed (constipation). Mix in 8 oz juice and drink    . potassium chloride SA (K-DUR,KLOR-CON) 20 MEQ tablet Take 20 mEq by mouth 2 (two) times daily.     Marland Kitchen  pravastatin (PRAVACHOL) 40 MG tablet Take 40 mg by mouth at bedtime.     . Tetrahydrozoline HCl (VISINE OP) Place 1 drop into both eyes daily as needed (itching).    . vitamin C (ASCORBIC ACID) 500 MG tablet Take 500 mg by mouth daily after lunch.     . vitamin E 400 UNIT capsule Take 400 Units by mouth daily after lunch.     No facility-administered medications prior to visit.         Objective:   Physical Exam Vitals:   02/17/16 1025  BP: 126/62  Pulse: 70  SpO2: 96%  Weight: 149 lb (67.6 kg)  Height: 5' (1.524 m)   Gen: Pleasant, elderly woman, in no distress,  normal affect  ENT: No lesions,  mouth clear,  oropharynx clear, no postnasal drip, poor dentition  Neck: No JVD, no TMG, no carotid bruits  Lungs: No use of accessory muscles, right basilar insp crackles.   Cardiovascular: RRR, heart sounds normal, no murmur or gallops, no peripheral edema  Musculoskeletal: No deformities, no cyanosis or clubbing  Neuro: alert, non focal  Skin: Warm, no lesions or rashes       Assessment & Plan:  COPD (chronic obstructive pulmonary disease) This diagnosis is unclear to me but she did have a secondhand smoke exposure and she has appeared to clinically respond to [place] for now, continue albuterol as needed. Check pulmonary function testing and follow her clinical improvement. We may be able to stop the scheduled bronchodilators depending on how  much obstruction she actually has  ILD (interstitial lung disease) ([place]) Mild basilar interstitial infiltrates that appear to be long-standing based on serial scans. I think this is most likely related to chronic aspiration given her description of her swallowing problems. I doubt that this is amiodarone toxicity. We will perform a swallowing evaluation/modified barium swallow. I will also follow her clinically and determine the timing of any repeat CT scan or chest x-ray. I do not believe she needs to stop amiodarone at this time.  Chronic cough Suspect contribution of aspiration, possibly GERD. She will need a modified diet. Denies much PND. Has been rx for COPD although unclear how much of this is present. Will continue PPI, assess swallowing.   Baltazar Apo, MD, PhD 02/17/2016, 11:00 AM [facility] [phone] or if no answer [phone]

## 2016-02-17 NOTE — Assessment & Plan Note (Signed)
This diagnosis is unclear to me but she did have a secondhand smoke exposure and she has appeared to clinically respond to [place] for now, continue albuterol as needed. Check pulmonary function testing and follow her clinical improvement. We may be able to stop the scheduled bronchodilators depending on how much obstruction she actually has

## 2016-02-17 NOTE — Assessment & Plan Note (Signed)
Suspect contribution of aspiration, possibly GERD. She will need a modified diet. Denies much PND. Has been rx for COPD although unclear how much of this is present. Will continue PPI, assess swallowing.

## 2016-02-17 NOTE — Patient Instructions (Addendum)
We will continue your Breo once a day. Rinse and gargle after taking this medication.  Keep albuterol nebulizer available to use as needed for shortness of breath.  We will perform a swallowing evaluation to determine which foods are OK and to learn techniques to help avoid aspiration.  Continue protonix once a day We will perform Spirometry in the pulmonary function lab.  We will follow your CXR and possibly your CT scan of the chest in the future depending on how your breathing does.  Follow with Dr Lamonte Sakai in 1 months or sooner if you have any problems. We will go over your testing at that time.

## 2016-02-20 ENCOUNTER — Telehealth: Payer: Self-pay | Admitting: Cardiology

## 2016-02-20 ENCOUNTER — Ambulatory Visit (INDEPENDENT_AMBULATORY_CARE_PROVIDER_SITE_OTHER): Payer: PPO | Admitting: *Deleted

## 2016-02-20 DIAGNOSIS — I495 Sick sinus syndrome: Secondary | ICD-10-CM

## 2016-02-20 NOTE — Progress Notes (Signed)
Remote pacemaker transmission.   

## 2016-02-20 NOTE — Telephone Encounter (Signed)
Confirmed remote transmission w/ pt daughter.   

## 2016-02-22 ENCOUNTER — Ambulatory Visit (HOSPITAL_COMMUNITY)
Admission: RE | Admit: 2016-02-22 | Discharge: 2016-02-22 | Disposition: A | Discharge status: Home / Self Care | Payer: PPO | Source: Ambulatory Visit | Attending: Emergency Medicine | Admitting: Emergency Medicine

## 2016-02-22 DIAGNOSIS — N189 Chronic kidney disease, unspecified: Secondary | ICD-10-CM | POA: Diagnosis not present

## 2016-02-22 DIAGNOSIS — R001 Bradycardia, unspecified: Secondary | ICD-10-CM | POA: Insufficient documentation

## 2016-02-22 DIAGNOSIS — I5032 Chronic diastolic (congestive) heart failure: Secondary | ICD-10-CM | POA: Diagnosis not present

## 2016-02-22 DIAGNOSIS — R1310 Dysphagia, unspecified: Secondary | ICD-10-CM | POA: Diagnosis not present

## 2016-02-22 DIAGNOSIS — R1313 Dysphagia, pharyngeal phase: Secondary | ICD-10-CM | POA: Insufficient documentation

## 2016-02-22 DIAGNOSIS — J449 Chronic obstructive pulmonary disease, unspecified: Secondary | ICD-10-CM | POA: Insufficient documentation

## 2016-02-22 DIAGNOSIS — I130 Hypertensive heart and chronic kidney disease with heart failure and stage 1 through stage 4 chronic kidney disease, or unspecified chronic kidney disease: Secondary | ICD-10-CM | POA: Insufficient documentation

## 2016-02-22 DIAGNOSIS — K219 Gastro-esophageal reflux disease without esophagitis: Secondary | ICD-10-CM | POA: Insufficient documentation

## 2016-02-22 DIAGNOSIS — I480 Paroxysmal atrial fibrillation: Secondary | ICD-10-CM | POA: Diagnosis not present

## 2016-02-22 DIAGNOSIS — K5792 Diverticulitis of intestine, part unspecified, without perforation or abscess without bleeding: Secondary | ICD-10-CM | POA: Diagnosis not present

## 2016-02-22 DIAGNOSIS — H3530 Unspecified macular degeneration: Secondary | ICD-10-CM | POA: Insufficient documentation

## 2016-02-22 DIAGNOSIS — A0472 Enterocolitis due to Clostridium difficile, not specified as recurrent: Secondary | ICD-10-CM | POA: Insufficient documentation

## 2016-02-22 DIAGNOSIS — I2510 Atherosclerotic heart disease of native coronary artery without angina pectoris: Secondary | ICD-10-CM | POA: Insufficient documentation

## 2016-02-22 NOTE — Progress Notes (Signed)
Modified Barium Swallow Progress Note  Patient Details  Name: VAIDEHI SPAAR MRN: [id number] Date of Birth: 1917-12-10  Today's Date: 02/22/2016  Modified Barium Swallow completed.  Full report located under Chart Review in the Imaging Section.  Brief recommendations include the following:  Clinical Impression  Pt presents with mild pharyngeal dysphagia. Piecemeal swallowing observed with pureed and regular solids. A delay in swallow initiation to the valleculae occurred for thin and nectar thick liquids. Intermittent episodes of flash penetration and 1X trace penetration occurred with thin liquids via straw, but penetrates cleared with cue for throat clear. Mild vallecular residue was seen following liquid trials. Recommend pt continue with regular diet and thin liquids. SLP provided education for small sips, slow pace, and intermittent throat clearing throughout drinking to minimize risk for penetration. SLP and pt discussed if pt's s/s (persisent coughing) does not improve, she may wish to consider moving to nectar thick liquids given adequate airway protection seen for these trials. No further ST necessary at this time.   Swallow Evaluation Recommendations       SLP Diet Recommendations: Regular solids;Thin liquid   Liquid Administration via: Cup;Straw   Medication Administration: Whole meds with puree   Supervision: Patient able to self feed   Compensations: Minimize environmental distractions;Slow rate;Small sips/bites;Clear throat intermittently   Postural Changes: Remain semi-upright after after feeds/meals (Comment);Seated upright at 90 degrees   Oral Care Recommendations: Patient independent with oral care       Ezekiel Slocumb, Student SLP  Shela Leff 02/22/2016,1:47 PM

## 2016-02-24 ENCOUNTER — Other Ambulatory Visit: Payer: Self-pay | Admitting: *Deleted

## 2016-02-24 NOTE — Patient Outreach (Signed)
[facility]) Care Management  02/24/2016  Laura Zuniga [facility] 1917-10-09 [id number]    RN Health Coach attempted  Follow up outreach call to patient.  Patient was unavailable. HIPPA compliance voicemail message was left with return callback number.  Plan: RN will call patient again within 14 days.    [place] [facility] Care Management [phone]

## 2016-03-03 LAB — CUP PACEART REMOTE DEVICE CHECK
Battery Remaining Longevity: 101 mo
Battery Remaining Percentage: 95.5 %
Battery Voltage: 3.01 V
Brady Statistic AP VP Percent: 12 %
Brady Statistic AS VS Percent: 1.5 %
Implantable Lead Implant Date: 20170207
Implantable Lead Location: 753860
Implantable Pulse Generator Implant Date: 20170207
Lead Channel Impedance Value: 440 Ohm
Lead Channel Impedance Value: 440 Ohm
Lead Channel Pacing Threshold Amplitude: 1.125 V
Lead Channel Pacing Threshold Pulse Width: 0.5 ms
Lead Channel Pacing Threshold Pulse Width: 0.5 ms
Lead Channel Sensing Intrinsic Amplitude: 2.5 mV
Lead Channel Setting Pacing Amplitude: 2.5 V
MDC IDC LEAD IMPLANT DT: 20170207
MDC IDC LEAD LOCATION: 753859
MDC IDC MSMT LEADCHNL RV PACING THRESHOLD AMPLITUDE: 0.5 V
MDC IDC MSMT LEADCHNL RV SENSING INTR AMPL: 7.4 mV
MDC IDC PG SERIAL: 7866856
MDC IDC SESS DTM: 20171113180807
MDC IDC SET LEADCHNL RA PACING AMPLITUDE: 2.125
MDC IDC SET LEADCHNL RV PACING PULSEWIDTH: 0.5 ms
MDC IDC SET LEADCHNL RV SENSING SENSITIVITY: 2 mV
MDC IDC STAT BRADY AP VS PERCENT: 86 %
MDC IDC STAT BRADY AS VP PERCENT: 1 %
MDC IDC STAT BRADY RA PERCENT PACED: 98 %
MDC IDC STAT BRADY RV PERCENT PACED: 12 %

## 2016-03-06 ENCOUNTER — Other Ambulatory Visit: Payer: Self-pay | Admitting: *Deleted

## 2016-03-06 NOTE — Patient Outreach (Signed)
[facility]) Care Management  03/06/2016  Bekah Seher [facility] Oct 20, 1917 [id number]    RN Health Coach  attempted  Follow up outreach call to patient.  Patient was unavailable.  Per [name] Daughter and caregiver she was on her way to the graveyard and requested that I call back tomorrow. Plan: RN will call patient again 03/07/2016.     [place] Care Management [phone]

## 2016-03-07 ENCOUNTER — Other Ambulatory Visit: Payer: Self-pay | Admitting: *Deleted

## 2016-03-07 ENCOUNTER — Encounter: Payer: Self-pay | Admitting: *Deleted

## 2016-03-07 NOTE — Patient Outreach (Signed)
[facility]) Care Management  03/07/2016  Laura Zuniga [facility] May 06, 1917 [id number]    [place]  attempted 3 rd Follow up outreach call to patient.  Patient was unavailable. Daughter had requested call bak today.  HIPPA compliance voicemail message was left with return callback number.  Plan: RN will send unsuccessful outreach letter today and closure letter within 10 business days  [place] Management [phone]

## 2016-03-19 ENCOUNTER — Encounter: Payer: Self-pay | Admitting: *Deleted

## 2016-03-19 ENCOUNTER — Ambulatory Visit: Payer: Self-pay | Admitting: *Deleted

## 2016-03-23 ENCOUNTER — Encounter: Payer: Self-pay | Admitting: Emergency Medicine

## 2016-03-23 ENCOUNTER — Ambulatory Visit (HOSPITAL_COMMUNITY)
Admission: RE | Admit: 2016-03-23 | Discharge: 2016-03-23 | Disposition: A | Discharge status: Home / Self Care | Payer: PPO | Source: Ambulatory Visit | Attending: Emergency Medicine | Admitting: Emergency Medicine

## 2016-03-23 ENCOUNTER — Ambulatory Visit (INDEPENDENT_AMBULATORY_CARE_PROVIDER_SITE_OTHER): Payer: PPO | Admitting: Emergency Medicine

## 2016-03-23 DIAGNOSIS — J449 Chronic obstructive pulmonary disease, unspecified: Secondary | ICD-10-CM | POA: Diagnosis not present

## 2016-03-23 DIAGNOSIS — R053 Chronic cough: Secondary | ICD-10-CM

## 2016-03-23 DIAGNOSIS — J849 Interstitial pulmonary disease, unspecified: Secondary | ICD-10-CM | POA: Diagnosis not present

## 2016-03-23 DIAGNOSIS — R05 Cough: Secondary | ICD-10-CM

## 2016-03-23 DIAGNOSIS — R0600 Dyspnea, unspecified: Secondary | ICD-10-CM | POA: Diagnosis not present

## 2016-03-23 LAB — PULMONARY FUNCTION TEST
FEF 25-75 POST: 1.04 L/s
FEF 25-75 Pre: 0.93 L/sec
FEF2575-%Change-Post: 11 %
FEF2575-%PRED-PRE: 332 %
FEF2575-%Pred-Post: 370 %
FEV1-%Change-Post: 6 %
FEV1-%PRED-PRE: 109 %
FEV1-%Pred-Post: 117 %
FEV1-POST: 1.11 L
FEV1-PRE: 1.04 L
FEV1FVC-%CHANGE-POST: 0 %
FEV1FVC-%Pred-Pre: 113 %
FEV6-%CHANGE-POST: 7 %
FEV6-%PRED-POST: 117 %
FEV6-%PRED-PRE: 109 %
FEV6-POST: 1.4 L
FEV6-PRE: 1.3 L
FEV6FVC-%PRED-POST: 112 %
FEV6FVC-%PRED-PRE: 112 %
FVC-%Change-Post: 6 %
FVC-%PRED-POST: 105 %
FVC-%Pred-Pre: 98 %
FVC-POST: 1.4 L
FVC-Pre: 1.31 L
POST FEV6/FVC RATIO: 100 %
PRE FEV1/FVC RATIO: 80 %
Post FEV1/FVC ratio: 79 %
Pre FEV6/FVC Ratio: 100 %

## 2016-03-23 MED ORDER — ALBUTEROL SULFATE (2.5 MG/3ML) 0.083% IN NEBU
2.5000 mg | INHALATION_SOLUTION | Freq: Once | RESPIRATORY_TRACT | Status: AC
  Administered 2016-03-23: 2.5 mg via RESPIRATORY_TRACT

## 2016-03-23 NOTE — Progress Notes (Signed)
Subjective:    Patient ID: Laura Zuniga, female    DOB: [birth year], 82 y.o.   MRN: [id number]  Shortness of Breath  Associated symptoms include wheezing. Pertinent negatives include no ear pain, fever, headaches, leg swelling, rash, rhinorrhea, sore throat or vomiting.   82 year old woman, never smoker who carries a diagnosis of COPD this was made. She also has presumed coronary disease based on nuclear stress imaging, hypertension with associated diastolic CHF, rib relation. She was on amiodarone in June 2015 after being admitted with C. difficile colitis complicated by atrial fibrillation with RVR. She has also had admissions for diastolic CHF, bradycardia arrhythmias with associated orthostasis, decreased amiodarone and pacemaker placed.  She has been evaluated for chronic and recurrent episodes of cough. She underwent a CT scan of the chest 01/20/16 that I personally reviewed. This shows some very mild basilar interstitial changes but no evidence of acute amiodarone toxicity.   She was treated with abx x 2, cough medicines end of September into October in setting apparent URI. She has baseline exertional dyspnea. No wheeze. Her cough is back to baseline. The dyspnea goes back for a few years. When she swallows she gets some odynophagia. She gets "strangled" on water. She has been on albuterol nebs before, was recently started on Breo in October and she thinks that it has helped her. She has some heartburn sx. No real congestion, allergy sx.  ? Whether she had an aspiration pneumonitis / PNA in Sept / Oct.   ROV 03/23/16 -- This follow-up visit for dyspnea and chronic cough; there has been some speculation that she may have obstructive lung disease, and treated with Breo. Also with some mild interstitial infiltrates on CT scan that may be related to chronic aspiration.  Swallowing evaluation done 02/22/16 showed mild pharyngeal and associated mild aspiration risk. It was recommended that she can  you a regular diet, thin liquids and practice swallowing precautions. Pulmonary function testing was performed day and I have personally reviewed. This showed grossly normal spirometry although the was some curve to the flow volume loop. She believes that her cough is a bit better than last time. She remains on Breo - but has ben taking only some days.    Review of Systems  Constitutional: Negative for fever and unexpected weight change.  HENT: Negative for congestion, dental problem, ear pain, nosebleeds, postnasal drip, rhinorrhea, sinus pressure, sneezing, sore throat and trouble swallowing.   Eyes: Negative for redness and itching.  Respiratory: Positive for cough, shortness of breath and wheezing. Negative for chest tightness.   Cardiovascular: Negative for palpitations and leg swelling.  Gastrointestinal: Negative for nausea and vomiting.  Genitourinary: Negative for dysuria.  Musculoskeletal: Negative for joint swelling.  Skin: Negative for rash.  Neurological: Negative for headaches.  Hematological: Does not bruise/bleed easily.  Psychiatric/Behavioral: Negative for dysphoric mood. The patient is not nervous/anxious.     Past Medical History:  Diagnosis Date  . Bradycardia    a. Amiodarone DC'd 12/16 >> b. Holter 1/17 - NSR, sinus brady, junctional rhythm, pause 2.4 s  . C. difficile colitis 2015  . CAD (coronary artery disease)    never had cardiac cath, CAD dx by positive ant. wall defect on Nuc. study 2007 >>  no cath due to high risk/advanced age  . Chronic diastolic CHF (congestive heart failure) ([place])    a. Echo 6/15 - Mild LVH, EF 55-60%, mild AI, mild MR, moderate LAE, mild RAE, PASP 52 mm Hg  .  CKD (chronic kidney disease)   . COPD (chronic obstructive pulmonary disease) ([place])   . Diverticulitis   . GERD (gastroesophageal reflux disease)   . Hypertension   . Macular degeneration   . PAF (paroxysmal atrial fibrillation) ([facility])    not optimal candidate for  anticoagulation     Family History  Problem Relation Age of Onset  . Heart attack Father 81  . Heart attack Sister   . Cancer Brother   . Cancer Brother   . Cirrhosis Brother   . Heart disease Brother   . Cancer Brother   . COPD Brother   . COPD Sister   . Cancer Sister   . Cancer Sister   . Hypertension Daughter   . Stroke Neg Hx      Social History   Social History  . Marital status: Widowed    Spouse name: N/A  . Number of children: N/A  . Years of education: N/A   Occupational History  . Not on file.   Social History Main Topics  . Smoking status: Never Smoker  . Smokeless tobacco: Never Used  . Alcohol use No  . Drug use: No  . Sexual activity: No   Other Topics Concern  . Not on file   Social History Narrative        father died age 64: [place] MI         mother died age 35: gallbladder surgery complications         4 brothers: all deceased; prostate ca, throat ca, emphesema, MI, cirrhosis from ETOH         3 sisters: one living: MI, emphesema, 2 sisters with breast CA     Allergies  Allergen Reactions  . Penicillins Other (See Comments)    Unknown allergic reaction  . 2,4-D Dimethylamine (Amisol) Other (See Comments)  . Ace Inhibitors Other (See Comments)  . Lanolin Hives  . Sulfonamide Derivatives Swelling and Rash     Outpatient Medications Prior to Visit  Medication Sig Dispense Refill  . acetaminophen (TYLENOL) 500 MG tablet Take 500-1,000 mg by mouth every 6 (six) hours as needed for moderate pain or headache.    . albuterol (PROVENTIL HFA;VENTOLIN HFA) 108 (90 BASE) MCG/ACT inhaler Inhale 2 puffs into the lungs every 6 (six) hours as needed for wheezing or shortness of breath. 1 Inhaler 3  . albuterol (PROVENTIL) (2.5 MG/3ML) 0.083% nebulizer solution Take 3 mLs (2.5 mg total) by nebulization every 6 (six) hours as needed for wheezing or shortness of breath. 75 mL 12  . amiodarone (PACERONE) 100 MG tablet Take 1 tablet (100 mg total) by  mouth daily. 30 tablet 6  . calcium-vitamin D (OSCAL WITH D) 500-200 MG-UNIT per tablet Take 1 tablet by mouth daily after lunch.     . clopidogrel (PLAVIX) 75 MG tablet TAKE ONE TABLET BY MOUTH ONCE DAILY 30 tablet 3  . Cranberry 250 MG CAPS Take 250 mg by mouth daily after lunch.     . Dietary Management Product (TOZAL) CAPS Take 1 capsule by mouth 2 (two) times daily.    Marland Kitchen escitalopram (LEXAPRO) 10 MG tablet Take 10 mg by mouth daily.     . ferrous sulfate 325 (65 FE) MG tablet Take 325 mg by mouth daily after lunch.     . fluticasone furoate-vilanterol (BREO ELLIPTA) 100-25 MCG/INH AEPB Inhale 1 puff into the lungs daily. 60 each 5  . furosemide (LASIX) 40 MG tablet TAKE TWO TABLETS BY MOUTH IN  THE MORNING, THEN TAKE ONE IN THE EVENING, YOU CAN TAKE AN EXTRA 40 MG IN THE EVENING IF NEEDED 90 tablet 6  . hydrALAZINE (APRESOLINE) 25 MG tablet Take 1 tablet (25 mg total) by mouth 2 (two) times daily. 180 tablet 3  . isosorbide mononitrate (IMDUR) 60 MG 24 hr tablet Take 1 tablet (60 mg total) by mouth daily. 90 tablet 5  . levothyroxine (SYNTHROID, LEVOTHROID) 50 MCG tablet Take 50 mcg by mouth daily before breakfast.     . Multiple Vitamins-Minerals (HAIR/SKIN/NAILS/BIOTIN) TABS Take 1 tablet by mouth daily after lunch.    . pantoprazole (PROTONIX) 40 MG tablet Take 40 mg by mouth daily after lunch.     . polyethylene glycol (MIRALAX / GLYCOLAX) packet Take 17 g by mouth daily as needed (constipation). Mix in 8 oz juice and drink    . potassium chloride SA (K-DUR,KLOR-CON) 20 MEQ tablet Take 20 mEq by mouth 2 (two) times daily.     . pravastatin (PRAVACHOL) 40 MG tablet Take 40 mg by mouth at bedtime.     . Tetrahydrozoline HCl (VISINE OP) Place 1 drop into both eyes daily as needed (itching).    . vitamin C (ASCORBIC ACID) 500 MG tablet Take 500 mg by mouth daily after lunch.     . vitamin E 400 UNIT capsule Take 400 Units by mouth daily after lunch.     No facility-administered medications  prior to visit.         Objective:   Physical Exam Vitals:   03/23/16 1432  BP: 122/72  Pulse: 60  SpO2: 95%  Weight: 149 lb (67.6 kg)  Height: 5\' 1"  (1.549 m)   Gen: Pleasant, elderly woman, in no distress,  normal affect  ENT: No lesions,  mouth clear,  oropharynx clear, no postnasal drip, poor dentition  Neck: No JVD, no TMG, no carotid bruits  Lungs: No use of accessory muscles, right basilar insp crackles.   Cardiovascular: RRR, heart sounds normal, no murmur or gallops, no peripheral edema  Musculoskeletal: No deformities, no cyanosis or clubbing  Neuro: alert, non focal  Skin: Warm, no lesions or rashes       Assessment & Plan:  COPD (chronic obstructive pulmonary disease) No evidence of obstruction on her current spirometry. I suspect that she no longer needs Breo. We will do next a trial off of this medication. She can continue use albuterol when necessary. Although I suspect that this is minimally effective.  ILD (interstitial lung disease) ([place]) With amiodarone toxicity. She does have some episodic dysphagia. I do not believe we need any additional workup at this time.  Chronic cough Some intermittent dysphagia. I hope that stopping her Breo and practicing swallowing precautions will help with this.  Baltazar Apo, MD, PhD 03/23/2016, 3:07 PM [facility] [phone] or if no answer [phone]

## 2016-03-23 NOTE — Assessment & Plan Note (Signed)
No evidence of obstruction on her current spirometry. I suspect that she no longer needs Breo. We will do next a trial off of this medication. She can continue use albuterol when necessary. Although I suspect that this is minimally effective.

## 2016-03-23 NOTE — Assessment & Plan Note (Signed)
Some intermittent dysphagia. I hope that stopping her Breo and practicing swallowing precautions will help with this.

## 2016-03-23 NOTE — Assessment & Plan Note (Signed)
With amiodarone toxicity. She does have some episodic dysphagia. I do not believe we need any additional workup at this time.

## 2016-03-23 NOTE — Patient Instructions (Addendum)
We will stop breo for the next month. Please call if you believe that you are missing this medication.  Use albuterol 2 puffs up to every 4 hours if needed for shortness of breath. Use a spacer.  Practice safe swallowing precautions - small bites, chew food completely, small sips. No straws. Follow with Dr Lamonte Sakai in 6 months or sooner if you have any problems

## 2016-04-08 ENCOUNTER — Other Ambulatory Visit: Payer: Self-pay | Admitting: Interventional Cardiology

## 2016-04-17 DIAGNOSIS — X32XXXA Exposure to sunlight, initial encounter: Secondary | ICD-10-CM | POA: Diagnosis not present

## 2016-04-17 DIAGNOSIS — L723 Sebaceous cyst: Secondary | ICD-10-CM | POA: Diagnosis not present

## 2016-04-17 DIAGNOSIS — C44311 Basal cell carcinoma of skin of nose: Secondary | ICD-10-CM | POA: Diagnosis not present

## 2016-04-17 DIAGNOSIS — D225 Melanocytic nevi of trunk: Secondary | ICD-10-CM | POA: Diagnosis not present

## 2016-04-17 DIAGNOSIS — L570 Actinic keratosis: Secondary | ICD-10-CM | POA: Diagnosis not present

## 2016-04-19 ENCOUNTER — Telehealth: Payer: Self-pay | Admitting: Interventional Cardiology

## 2016-04-19 NOTE — Telephone Encounter (Signed)
New message      What dental office are you calling from?   Dianna Limbo, DDS What is your office phone and fax number?  Fax [phone] What type of procedure is the patient having performed?  Tooth extraction What date is procedure scheduled? Pending clearance 1. What is your question (ex. Antibiotics prior to procedure, holding medication-we need to know how long dentist wants pt to hold med)?  Can pt stop plavix prior to extraction?

## 2016-04-22 NOTE — Telephone Encounter (Signed)
OK to hold Plavix 5 days prior to tooth extraction.  No need for antibiotics if she has not taken any in the past for dental procedures.

## 2016-04-23 NOTE — Telephone Encounter (Signed)
**Note De-Identified  Obfuscation** This message has been faxed to Dr Radford Pax at [phone]. I did receive confirmation that the fax was successful.

## 2016-05-01 DIAGNOSIS — C44311 Basal cell carcinoma of skin of nose: Secondary | ICD-10-CM | POA: Diagnosis not present

## 2016-05-01 DIAGNOSIS — C4442 Squamous cell carcinoma of skin of scalp and neck: Secondary | ICD-10-CM | POA: Diagnosis not present

## 2016-05-27 ENCOUNTER — Emergency Department (HOSPITAL_COMMUNITY)
Admission: EM | Admit: 2016-05-27 | Discharge: 2016-05-27 | Disposition: A | Discharge status: Home / Self Care | Payer: PPO | Attending: Emergency Medicine | Admitting: Emergency Medicine

## 2016-05-27 ENCOUNTER — Encounter (HOSPITAL_COMMUNITY): Payer: Self-pay | Admitting: Emergency Medicine

## 2016-05-27 ENCOUNTER — Emergency Department (HOSPITAL_COMMUNITY): Payer: PPO

## 2016-05-27 DIAGNOSIS — S59912A Unspecified injury of left forearm, initial encounter: Secondary | ICD-10-CM | POA: Diagnosis not present

## 2016-05-27 DIAGNOSIS — Y92009 Unspecified place in unspecified non-institutional (private) residence as the place of occurrence of the external cause: Secondary | ICD-10-CM | POA: Diagnosis not present

## 2016-05-27 DIAGNOSIS — I5032 Chronic diastolic (congestive) heart failure: Secondary | ICD-10-CM | POA: Insufficient documentation

## 2016-05-27 DIAGNOSIS — S299XXA Unspecified injury of thorax, initial encounter: Secondary | ICD-10-CM | POA: Diagnosis not present

## 2016-05-27 DIAGNOSIS — M79622 Pain in left upper arm: Secondary | ICD-10-CM | POA: Diagnosis not present

## 2016-05-27 DIAGNOSIS — J449 Chronic obstructive pulmonary disease, unspecified: Secondary | ICD-10-CM | POA: Insufficient documentation

## 2016-05-27 DIAGNOSIS — S199XXA Unspecified injury of neck, initial encounter: Secondary | ICD-10-CM | POA: Diagnosis not present

## 2016-05-27 DIAGNOSIS — M25651 Stiffness of right hip, not elsewhere classified: Secondary | ICD-10-CM | POA: Diagnosis not present

## 2016-05-27 DIAGNOSIS — I2510 Atherosclerotic heart disease of native coronary artery without angina pectoris: Secondary | ICD-10-CM | POA: Insufficient documentation

## 2016-05-27 DIAGNOSIS — S4992XA Unspecified injury of left shoulder and upper arm, initial encounter: Secondary | ICD-10-CM | POA: Diagnosis not present

## 2016-05-27 DIAGNOSIS — M79602 Pain in left arm: Secondary | ICD-10-CM | POA: Diagnosis not present

## 2016-05-27 DIAGNOSIS — S8991XA Unspecified injury of right lower leg, initial encounter: Secondary | ICD-10-CM | POA: Diagnosis not present

## 2016-05-27 DIAGNOSIS — S0083XA Contusion of other part of head, initial encounter: Secondary | ICD-10-CM | POA: Diagnosis not present

## 2016-05-27 DIAGNOSIS — M25552 Pain in left hip: Secondary | ICD-10-CM | POA: Diagnosis not present

## 2016-05-27 DIAGNOSIS — M546 Pain in thoracic spine: Secondary | ICD-10-CM | POA: Diagnosis not present

## 2016-05-27 DIAGNOSIS — I130 Hypertensive heart and chronic kidney disease with heart failure and stage 1 through stage 4 chronic kidney disease, or unspecified chronic kidney disease: Secondary | ICD-10-CM | POA: Insufficient documentation

## 2016-05-27 DIAGNOSIS — W01198A Fall on same level from slipping, tripping and stumbling with subsequent striking against other object, initial encounter: Secondary | ICD-10-CM | POA: Insufficient documentation

## 2016-05-27 DIAGNOSIS — S0990XA Unspecified injury of head, initial encounter: Secondary | ICD-10-CM | POA: Diagnosis not present

## 2016-05-27 DIAGNOSIS — M79642 Pain in left hand: Secondary | ICD-10-CM | POA: Diagnosis not present

## 2016-05-27 DIAGNOSIS — W19XXXA Unspecified fall, initial encounter: Secondary | ICD-10-CM

## 2016-05-27 DIAGNOSIS — Y999 Unspecified external cause status: Secondary | ICD-10-CM | POA: Insufficient documentation

## 2016-05-27 DIAGNOSIS — S5012XA Contusion of left forearm, initial encounter: Secondary | ICD-10-CM | POA: Diagnosis not present

## 2016-05-27 DIAGNOSIS — Y939 Activity, unspecified: Secondary | ICD-10-CM | POA: Insufficient documentation

## 2016-05-27 DIAGNOSIS — Z79899 Other long term (current) drug therapy: Secondary | ICD-10-CM | POA: Diagnosis not present

## 2016-05-27 DIAGNOSIS — N189 Chronic kidney disease, unspecified: Secondary | ICD-10-CM | POA: Diagnosis not present

## 2016-05-27 DIAGNOSIS — M25512 Pain in left shoulder: Secondary | ICD-10-CM | POA: Diagnosis not present

## 2016-05-27 DIAGNOSIS — M542 Cervicalgia: Secondary | ICD-10-CM | POA: Diagnosis not present

## 2016-05-27 DIAGNOSIS — R079 Chest pain, unspecified: Secondary | ICD-10-CM | POA: Diagnosis not present

## 2016-05-27 DIAGNOSIS — Z7901 Long term (current) use of anticoagulants: Secondary | ICD-10-CM

## 2016-05-27 DIAGNOSIS — S79912A Unspecified injury of left hip, initial encounter: Secondary | ICD-10-CM | POA: Diagnosis not present

## 2016-05-27 DIAGNOSIS — M25511 Pain in right shoulder: Secondary | ICD-10-CM | POA: Diagnosis not present

## 2016-05-27 MED ORDER — ACETAMINOPHEN 500 MG PO TABS: 1000.0000 mg | ORAL_TABLET | Freq: Four times a day (QID) | ORAL | 0 refills | Status: AC | PRN

## 2016-05-27 NOTE — ED Provider Notes (Addendum)
[facility] DEPT Provider Note   CSN: [id number] Arrival date & time: 05/27/16  1346     History   Chief Complaint Chief Complaint  Patient presents with  . Fall  . Head Injury  . Knee Pain    HPI Laura Zuniga is a 83 y.o. female.  HPI   83 yo F with PMHx of CAD, HTN, CKD, pAFib on Plavix who presents with fall and head injury. Pt was getting ready today and while bending down to put her pants on, fell forward and landed on her left shoulder, chest, and hip. She denies head injury or LOC. She had immediate onset mild left forehead pain, left shoulder pain, and left hip and bilateral Knee pain. The patient states the pain is aching, throbbing, worse with any movement or palpation. As mentioned, she did not lose consciousness. Denies any headache. Denies any new numbness or weakness. No back pain. No abdominal pain, nausea, or vomiting. She was in her usual state of health prior to the episode. No fevers or chills.  Past Medical History:  Diagnosis Date  . Bradycardia    a. Amiodarone DC'd 12/16 >> b. Holter 1/17 - NSR, sinus brady, junctional rhythm, pause 2.4 s  . C. difficile colitis 2015  . CAD (coronary artery disease)    never had cardiac cath, CAD dx by positive ant. wall defect on Nuc. study 2007 >>  no cath due to high risk/advanced age  . Chronic diastolic CHF (congestive heart failure) ([place])    a. Echo 6/15 - Mild LVH, EF 55-60%, mild AI, mild MR, moderate LAE, mild RAE, PASP 52 mm Hg  . CKD (chronic kidney disease)   . COPD (chronic obstructive pulmonary disease) ([place])   . Diverticulitis   . GERD (gastroesophageal reflux disease)   . Hypertension   . Macular degeneration   . PAF (paroxysmal atrial fibrillation) ([place])    not optimal candidate for anticoagulation    Patient Active Problem List   Diagnosis Date Noted  . ILD (interstitial lung disease) ([place]) 02/17/2016  . Chronic cough 02/17/2016  . Cardiac pacemaker 02/16/2016  . Sinus node dysfunction  () 05/05/2015  . Bradycardia 09/14/2014  . CKD (chronic kidney disease) 09/14/2014  . GERD (gastroesophageal reflux disease) 10/29/2013  . Hyponatremia 10/21/2013  . Acute blood loss anemia 10/01/2013  . Urinary retention 10/01/2013  . Chronic diastolic heart failure ([place]) 09/28/2013  . PAF (paroxysmal atrial fibrillation) ([place]) 09/27/2013  . C. difficile colitis 09/22/2013  . Melena 09/22/2013  . ATN (acute tubular necrosis) ([place]) 09/21/2013  . Nonspecific abnormal unspecified cardiovascular function study 06/11/2013  . Syncope 06/28/2011  . Swelling of left lower extremity 06/28/2011  . COPD (chronic obstructive pulmonary disease) ([place]) 06/28/2011  . DEMENTIA, CCE, W/O BEHAVIORAL DISTURBANCE 01/17/2007  . RENAL STONE 12/04/2006  . HLD (hyperlipidemia) 07/15/2006  . DEPRESSION 07/15/2006  . BLINDNESS, LEFT EYE 07/15/2006  . Essential hypertension 07/15/2006  . CAD (coronary artery disease) 07/15/2006  . OSTEOPOROSIS 07/15/2006    Past Surgical History:  Procedure Laterality Date  . APPENDECTOMY    . CHOLECYSTECTOMY    . EP IMPLANTABLE DEVICE N/A 05/17/2015   Procedure: Pacemaker Implant;  Surgeon: Evans Lance, MD;  Location: [facility];  Service: Cardiovascular;  Laterality: N/A;  . ESOPHAGOGASTRODUODENOSCOPY N/A 09/22/2013   Procedure: ESOPHAGOGASTRODUODENOSCOPY (EGD);  Surgeon: Lear Ng, MD;  Location: [facility] ENDOSCOPY;  Service: Endoscopy;  Laterality: N/A;  . ESOPHAGOGASTRODUODENOSCOPY N/A 09/22/2013   Procedure: ESOPHAGOGASTRODUODENOSCOPY (EGD);  Surgeon:  Lear Ng, MD;  Location: [facility] ENDOSCOPY;  Service: Endoscopy;  Laterality: N/A;  . EYE SURGERY    . FLEXIBLE SIGMOIDOSCOPY N/A 09/22/2013   Procedure: FLEXIBLE SIGMOIDOSCOPY;  Surgeon: Lear Ng, MD;  Location: [facility] ENDOSCOPY;  Service: Endoscopy;  Laterality: N/A;  . FLEXIBLE SIGMOIDOSCOPY N/A 09/22/2013   Procedure: FLEXIBLE SIGMOIDOSCOPY;  Surgeon: Lear Ng, MD;  Location:  [facility] ENDOSCOPY;  Service: Endoscopy;  Laterality: N/A;  unprepped/ egd first  . VAGINAL HYSTERECTOMY      OB History    No data available       Home Medications    Prior to Admission medications   Medication Sig Start Date End Date Taking? Authorizing Provider  albuterol (PROVENTIL HFA;VENTOLIN HFA) 108 (90 BASE) MCG/ACT inhaler Inhale 2 puffs into the lungs every 6 (six) hours as needed for wheezing or shortness of breath. 02/23/15  Yes Deloria Lair, NP  amiodarone (PACERONE) 100 MG tablet Take 1 tablet (100 mg total) by mouth daily. 01/10/16  Yes Burtis Junes, NP  Biotin 1 MG CAPS Take 1 mg by mouth daily.   Yes Historical Provider, MD  calcium-vitamin D (OSCAL WITH D) 500-200 MG-UNIT per tablet Take 1 tablet by mouth daily after lunch.    Yes Historical Provider, MD  clopidogrel (PLAVIX) 75 MG tablet TAKE ONE TABLET BY MOUTH ONCE DAILY 03/22/14  Yes Jettie Booze, MD  Cranberry 250 MG CAPS Take 250 mg by mouth daily after lunch.    Yes Historical Provider, MD  Dietary Management Product (TOZAL) CAPS Take 1 capsule by mouth 2 (two) times daily.   Yes Historical Provider, MD  docusate sodium (COLACE) 100 MG capsule Take 100 mg by mouth daily as needed for mild constipation.   Yes Historical Provider, MD  escitalopram (LEXAPRO) 10 MG tablet Take 10 mg by mouth daily.    Yes Historical Provider, MD  ferrous sulfate 325 (65 FE) MG tablet Take 325 mg by mouth daily after lunch.    Yes Historical Provider, MD  furosemide (LASIX) 40 MG tablet TAKE TWO TABLETS BY MOUTH IN THE MORNING, THEN TAKE ONE IN THE EVENING, YOU CAN TAKE AN EXTRA 40 MG IN THE EVENING IF NEEDED 05/13/14  Yes Jettie Booze, MD  hydrALAZINE (APRESOLINE) 25 MG tablet Take 1 tablet (25 mg total) by mouth 2 (two) times daily. 10/17/15  Yes Jettie Booze, MD  isosorbide mononitrate (IMDUR) 60 MG 24 hr tablet TAKE ONE TABLET BY MOUTH ONCE DAILY 04/10/16  Yes Jettie Booze, MD  levothyroxine (SYNTHROID,  LEVOTHROID) 50 MCG tablet Take 50 mcg by mouth daily before breakfast.  06/04/14  Yes Historical Provider, MD  Multiple Vitamins-Minerals (HAIR/SKIN/NAILS/BIOTIN) TABS Take 1 tablet by mouth daily after lunch.   Yes Historical Provider, MD  pantoprazole (PROTONIX) 40 MG tablet Take 40 mg by mouth daily after lunch.    Yes Historical Provider, MD  polyethylene glycol (MIRALAX / GLYCOLAX) packet Take 17 g by mouth daily as needed (constipation). Mix in 8 oz juice and drink   Yes Historical Provider, MD  potassium chloride SA (K-DUR,KLOR-CON) 20 MEQ tablet Take 20 mEq by mouth 2 (two) times daily.  05/20/14  Yes Historical Provider, MD  pravastatin (PRAVACHOL) 40 MG tablet Take 40 mg by mouth at bedtime.    Yes Historical Provider, MD  Tetrahydrozoline HCl (VISINE OP) Place 1 drop into both eyes daily as needed (itching).   Yes Historical Provider, MD  vitamin C (ASCORBIC ACID) 500 MG tablet Take  500 mg by mouth daily after lunch.    Yes Historical Provider, MD  vitamin E 400 UNIT capsule Take 400 Units by mouth daily after lunch.   Yes Historical Provider, MD  acetaminophen (TYLENOL) 500 MG tablet Take 2 tablets (1,000 mg total) by mouth every 6 (six) hours as needed for mild pain or moderate pain. 05/27/16   Duffy Bruce, MD  albuterol (PROVENTIL) (2.5 MG/3ML) 0.083% nebulizer solution Take 3 mLs (2.5 mg total) by nebulization every 6 (six) hours as needed for wheezing or shortness of breath. Patient not taking: Reported on 05/27/2016 12/28/15   Alfonzo Beers, MD  fluticasone furoate-vilanterol (BREO ELLIPTA) 100-25 MCG/INH AEPB Inhale 1 puff into the lungs daily. Patient not taking: Reported on 05/27/2016 02/17/16   Collene Gobble, MD    Family History Family History  Problem Relation Age of Onset  . Heart attack Father 70  . Heart attack Sister   . Cancer Brother   . Cancer Brother   . Cirrhosis Brother   . Heart disease Brother   . Cancer Brother   . COPD Brother   . COPD Sister   . Cancer  Sister   . Cancer Sister   . Hypertension Daughter   . Stroke Neg Hx     Social History Social History  Substance Use Topics  . Smoking status: Never Smoker  . Smokeless tobacco: Never Used  . Alcohol use No     Allergies   Penicillins; 2,4-d dimethylamine (amisol); Ace inhibitors; Lanolin; and Sulfonamide derivatives   Review of Systems Review of Systems  Constitutional: Negative for chills, fatigue and fever.  HENT: Negative for congestion and rhinorrhea.   Eyes: Negative for visual disturbance.  Respiratory: Negative for cough, shortness of breath and wheezing.   Cardiovascular: Negative for chest pain and leg swelling.  Gastrointestinal: Negative for abdominal pain, diarrhea, nausea and vomiting.  Genitourinary: Negative for dysuria and flank pain.  Musculoskeletal: Positive for arthralgias and myalgias. Negative for neck pain and neck stiffness.  Skin: Negative for rash and wound.  Allergic/Immunologic: Negative for immunocompromised state.  Neurological: Positive for headaches. Negative for syncope and weakness.  All other systems reviewed and are negative.    Physical Exam Updated Vital Signs BP 130/55   Pulse 61   Resp 18   Ht 5\' 4"  (1.626 m)   Wt 149 lb (67.6 kg)   SpO2 97%   BMI 25.58 kg/m   Physical Exam  Constitutional: She is oriented to person, place, and time. She appears well-developed and well-nourished. No distress.  HENT:  Head: Normocephalic and atraumatic.  Mouth/Throat: Oropharynx is clear and moist. No oropharyngeal exudate.  Small contusion to left forearm with no associated bony crepitance or deformity. No open wounds.  Eyes: Conjunctivae are normal. Pupils are equal, round, and reactive to light.  Neck: Neck supple. No JVD present.  Cardiovascular: Normal rate, regular rhythm and normal heart sounds.  Exam reveals no friction rub.   No murmur heard. Pulmonary/Chest: Effort normal and breath sounds normal. No respiratory distress. She  has no wheezes. She has no rales.  Abdominal: Soft. She exhibits no distension.  Musculoskeletal: She exhibits no edema.  Moderate left shoulder, upper arm pain as well as left hand pain. No bruising or deformity. Mild tenderness over left greater trochanter. Range of motion of hip is painless, however. Bilateral knee pain over her anterior aspects. No ligamentous instability.  Neurological: She is alert and oriented to person, place, and time. She exhibits normal muscle  tone.  Skin: Skin is warm. Capillary refill takes less than 2 seconds.  Psychiatric: She has a normal mood and affect.  Nursing note and vitals reviewed.    ED Treatments / Results  Labs (all labs ordered are listed, but only abnormal results are displayed) Labs Reviewed - No data to display  EKG  EKG Interpretation None       Radiology Dg Chest 1 View  Result Date: 05/27/2016 CLINICAL DATA:  Fall today with chest and left shoulder pain. EXAM: CHEST 1 VIEW COMPARISON:  01/13/2016 radiographs. FINDINGS: 1526 hours. Two views obtained. Lordotic positioning. Left subclavian pacemaker leads appear unchanged within the right atrium and right ventricle. The heart size and mediastinal contours are stable. The lungs are clear. There is no pleural effusion or pneumothorax. Prominent fat noted at the right cardiophrenic angle, stable. No acute osseous findings are seen. IMPRESSION: Stable chest.  No acute cardiopulmonary process. Electronically Signed   By: Richardean Sale M.D.   On: 05/27/2016 16:18   Dg Pelvis 1-2 Views  Result Date: 05/27/2016 CLINICAL DATA:  83 year old female with history of trauma from a fall earlier today complaining of left-sided hip pain. EXAM: PELVIS - 1-2 VIEW COMPARISON:  No priors. FINDINGS: AP view of the pelvis demonstrates no acute displaced fracture of the bony pelvic ring or of either of the visualized proximal femurs. Bilateral femoral heads appear to be located on this single view examination.  Dystrophic calcifications project adjacent to the left hip joint. Numerous sutures are noted within the anatomic pelvis. Degenerative changes of mild osteoarthritis are noted in the hip joints bilaterally. IMPRESSION: 1. No acute radiographic abnormality of the bony pelvis. Electronically Signed   By: [name] M.D.   On: 05/27/2016 16:24   Dg Knee 1-2 Views Right  Result Date: 05/27/2016 CLINICAL DATA:  Status post fall, with acute onset of right knee pain. Initial encounter. EXAM: RIGHT KNEE - 1-2 VIEW COMPARISON:  None. FINDINGS: There is no evidence of fracture or dislocation. The joint spaces are preserved. Mild marginal osteophyte formation is noted at the medial compartment; the patellofemoral joint is grossly unremarkable in appearance. No significant joint effusion is seen. Diffuse vascular calcifications are seen. IMPRESSION: 1. No evidence of fracture or dislocation. 2. Diffuse vascular calcifications seen. Electronically Signed   By: Garald Balding M.D.   On: 05/27/2016 16:22   Ct Head Wo Contrast  Result Date: 05/27/2016 CLINICAL DATA:  [place] striking LEFT side of head, large hematoma at LEFT temple, headache, neck pain, history chronic diastolic CHF, hypertension, coronary artery disease, COPD, paroxysmal atrial fibrillation EXAM: CT HEAD WITHOUT CONTRAST CT CERVICAL SPINE WITHOUT CONTRAST TECHNIQUE: Multidetector CT imaging of the head and cervical spine was performed following the standard protocol without intravenous contrast. Multiplanar CT image reconstructions of the cervical spine were also generated. COMPARISON:  CT head 07/03/2014 FINDINGS: CT HEAD FINDINGS Brain: Generalized atrophy. Normal ventricular morphology. No midline shift or mass effect. Otherwise normal appearance of brain parenchyma. No intracranial hemorrhage, mass lesion or evidence acute infarction. No extra-axial fluid collections. Vascular: Atherosclerotic calcifications of the carotid siphons. Skull: Diffuse  osseous demineralization. Calvaria appear intact. Small LEFT frontotemporal scalp hematoma. Sinuses/Orbits: Clear Other: N/A CT CERVICAL SPINE FINDINGS Alignment: Minimal anterolisthesis at C4-C5 likely degenerative. Remaining alignments normal. Skull base and vertebrae: Diffuse osseous demineralization. Visualized skullbase intact. Ankylosis of LEFT C4-C5 facet joint. Scattered facet degenerative changes. Vertebral body heights maintained without fracture or additional subluxation. Soft tissues and spinal canal: Prevertebral soft tissues normal  thickness. Atherosclerotic calcifications at the LEFT carotid bifurcation. Small calcifications within the thyroid lobes bilaterally. Disc levels:  Disc space narrowing C5-C6 and C6-C7. Upper chest: Tips of lung apices clear. Other: N/A IMPRESSION: Generalized atrophy. No acute intracranial abnormalities. Degenerative disc and facet disease changes of the cervical spine as above. No acute cervical spine abnormalities. Electronically Signed   By: Lavonia Dana M.D.   On: 05/27/2016 15:55   Ct Cervical Spine Wo Contrast  Result Date: 05/27/2016 CLINICAL DATA:  [place] striking LEFT side of head, large hematoma at LEFT temple, headache, neck pain, history chronic diastolic CHF, hypertension, coronary artery disease, COPD, paroxysmal atrial fibrillation EXAM: CT HEAD WITHOUT CONTRAST CT CERVICAL SPINE WITHOUT CONTRAST TECHNIQUE: Multidetector CT imaging of the head and cervical spine was performed following the standard protocol without intravenous contrast. Multiplanar CT image reconstructions of the cervical spine were also generated. COMPARISON:  CT head 07/03/2014 FINDINGS: CT HEAD FINDINGS Brain: Generalized atrophy. Normal ventricular morphology. No midline shift or mass effect. Otherwise normal appearance of brain parenchyma. No intracranial hemorrhage, mass lesion or evidence acute infarction. No extra-axial fluid collections. Vascular: Atherosclerotic calcifications of  the carotid siphons. Skull: Diffuse osseous demineralization. Calvaria appear intact. Small LEFT frontotemporal scalp hematoma. Sinuses/Orbits: Clear Other: N/A CT CERVICAL SPINE FINDINGS Alignment: Minimal anterolisthesis at C4-C5 likely degenerative. Remaining alignments normal. Skull base and vertebrae: Diffuse osseous demineralization. Visualized skullbase intact. Ankylosis of LEFT C4-C5 facet joint. Scattered facet degenerative changes. Vertebral body heights maintained without fracture or additional subluxation. Soft tissues and spinal canal: Prevertebral soft tissues normal thickness. Atherosclerotic calcifications at the LEFT carotid bifurcation. Small calcifications within the thyroid lobes bilaterally. Disc levels:  Disc space narrowing C5-C6 and C6-C7. Upper chest: Tips of lung apices clear. Other: N/A IMPRESSION: Generalized atrophy. No acute intracranial abnormalities. Degenerative disc and facet disease changes of the cervical spine as above. No acute cervical spine abnormalities. Electronically Signed   By: Lavonia Dana M.D.   On: 05/27/2016 15:55   Dg Shoulder Left  Result Date: 05/27/2016 CLINICAL DATA:  Fall today with left shoulder and chest pain. EXAM: LEFT SHOULDER - 2+ VIEW COMPARISON:  None. FINDINGS: The bones appear adequately mineralized. No evidence of acute fracture or dislocation. There are mild glenohumeral degenerative changes. Left subclavian pacemaker noted. IMPRESSION: No acute osseous findings. Electronically Signed   By: Richardean Sale M.D.   On: 05/27/2016 16:19   Dg Humerus Left  Result Date: 05/27/2016 CLINICAL DATA:  Fall today with left shoulder and chest pain. EXAM: LEFT HUMERUS - 2+ VIEW COMPARISON:  None. FINDINGS: The bones appear adequately mineralized. No evidence of acute fracture or dislocation. No focal soft tissue abnormalities are apparent. IMPRESSION: No acute osseous findings. Electronically Signed   By: Richardean Sale M.D.   On: 05/27/2016 16:20   Dg  Hand Complete Left  Result Date: 05/27/2016 CLINICAL DATA:  Status post fall, with left hand pain. Initial encounter. EXAM: LEFT HAND - COMPLETE 3+ VIEW COMPARISON:  None. FINDINGS: There is mild deformity of the distal radius. This may reflect remote injury. Would correlate for any associated symptoms. There is diffuse narrowing of the distal interphalangeal joints, likely reflecting osteoarthritis. Mild degenerative change is noted at the first carpometacarpal joint. Mild degenerative change is also noted at the radial aspect of the carpal rows. The soft tissues are unremarkable in appearance. IMPRESSION: 1. Mild deformity of the distal radius may reflect remote injury. Would correlate for any associated symptoms. No definite evidence of acute fracture or dislocation. 2.  Findings of osteoarthritis. Electronically Signed   By: Garald Balding M.D.   On: 05/27/2016 16:24   Dg Femur Min 2 Views Left  Result Date: 05/27/2016 CLINICAL DATA:  83 year old female status post fall today at home. Left hip and knee pain. Initial encounter. EXAM: LEFT FEMUR 2 VIEWS COMPARISON:  Pelvis 07/23/2014. FINDINGS: Bone mineralization is within normal limits for age. Left femoral head is normally located. Visible left hemipelvis appears intact. Intact proximal left femur. Small dystrophic calcifications about the greater trochanter. Intact left femoral shaft. Intact distal left femur with preserved alignment at the left knee. No knee joint effusion is identified. The patella appears intact. There is medial and lateral compartment knee joint space loss. Calcified left femoral artery atherosclerosis. IMPRESSION: No acute fracture or dislocation identified about the left femur degenerative changes at the left knee. Calcified left femoral artery atherosclerosis. Electronically Signed   By: Genevie Ann M.D.   On: 05/27/2016 16:25    Procedures Procedures (including critical care time)  Medications Ordered in ED Medications - No  data to display   Initial Impression / Assessment and Plan / ED Course  I have reviewed the triage vital signs and the nursing notes.  Pertinent labs & imaging results that were available during my care of the patient were reviewed by me and considered in my medical decision making (see chart for details).    83 yo F with PMHx as above including HTN, HLD, CAD on plavix here with mechanical fall and subsequent forehead contusion, left hip pain, left arm pain. Pt neurologically at baseline. Fall was mechanical and pt o/w at her baseline state of health. No numbness, weakness. No recent fever, chills, or infectious sx. Will obtain CT imaging, plain films of areas of pain. Pt in agreement. No abdominal pain, TTP to suggest intra-abdominal pathology.  Plain films negative. CT head/C-Spine negative. Patient ambulatory. Will d/c with outpt f/u and good return precautions.  Final Clinical Impressions(s) / ED Diagnoses   Final diagnoses:  Fall  Contusion of forehead, initial encounter  Left arm pain  Anticoagulated      Duffy Bruce, MD 05/27/16 1600    Duffy Bruce, MD 05/27/16 1655

## 2016-05-27 NOTE — ED Triage Notes (Addendum)
Pt in from home via [facility] after tripping, falling and landing on L side. Pt takes Plavix, has L forehead hematoma, c/o L shoulder soreness, and L knee pain. Pt able to move all extremities, no LOC, EMS reported some thoracic tenderness upon their assessment - c-collar on. Pt a&ox4, VSS, NAD

## 2016-05-27 NOTE — ED Notes (Signed)
Patient transported to CT 

## 2016-05-27 NOTE — ED Notes (Signed)
Patient transported to X-ray 

## 2016-05-29 DIAGNOSIS — X32XXXD Exposure to sunlight, subsequent encounter: Secondary | ICD-10-CM | POA: Diagnosis not present

## 2016-05-29 DIAGNOSIS — L570 Actinic keratosis: Secondary | ICD-10-CM | POA: Diagnosis not present

## 2016-05-29 DIAGNOSIS — Z08 Encounter for follow-up examination after completed treatment for malignant neoplasm: Secondary | ICD-10-CM | POA: Diagnosis not present

## 2016-05-29 DIAGNOSIS — C44311 Basal cell carcinoma of skin of nose: Secondary | ICD-10-CM | POA: Diagnosis not present

## 2016-05-29 DIAGNOSIS — Z85828 Personal history of other malignant neoplasm of skin: Secondary | ICD-10-CM | POA: Diagnosis not present

## 2016-06-07 ENCOUNTER — Emergency Department (HOSPITAL_COMMUNITY)
Admission: EM | Admit: 2016-06-07 | Discharge: 2016-06-08 | Disposition: A | Discharge status: Home / Self Care | Payer: PPO | Attending: Emergency Medicine | Admitting: Emergency Medicine

## 2016-06-07 DIAGNOSIS — I2510 Atherosclerotic heart disease of native coronary artery without angina pectoris: Secondary | ICD-10-CM | POA: Diagnosis not present

## 2016-06-07 DIAGNOSIS — J449 Chronic obstructive pulmonary disease, unspecified: Secondary | ICD-10-CM | POA: Insufficient documentation

## 2016-06-07 DIAGNOSIS — R197 Diarrhea, unspecified: Secondary | ICD-10-CM

## 2016-06-07 DIAGNOSIS — R05 Cough: Secondary | ICD-10-CM | POA: Diagnosis not present

## 2016-06-07 DIAGNOSIS — N189 Chronic kidney disease, unspecified: Secondary | ICD-10-CM | POA: Diagnosis not present

## 2016-06-07 DIAGNOSIS — I130 Hypertensive heart and chronic kidney disease with heart failure and stage 1 through stage 4 chronic kidney disease, or unspecified chronic kidney disease: Secondary | ICD-10-CM | POA: Insufficient documentation

## 2016-06-07 DIAGNOSIS — Z7902 Long term (current) use of antithrombotics/antiplatelets: Secondary | ICD-10-CM | POA: Insufficient documentation

## 2016-06-07 DIAGNOSIS — Z950 Presence of cardiac pacemaker: Secondary | ICD-10-CM | POA: Diagnosis not present

## 2016-06-07 DIAGNOSIS — I5032 Chronic diastolic (congestive) heart failure: Secondary | ICD-10-CM | POA: Diagnosis not present

## 2016-06-07 DIAGNOSIS — Z79899 Other long term (current) drug therapy: Secondary | ICD-10-CM | POA: Diagnosis not present

## 2016-06-07 DIAGNOSIS — R1111 Vomiting without nausea: Secondary | ICD-10-CM | POA: Diagnosis not present

## 2016-06-07 DIAGNOSIS — R112 Nausea with vomiting, unspecified: Secondary | ICD-10-CM

## 2016-06-07 DIAGNOSIS — R059 Cough, unspecified: Secondary | ICD-10-CM

## 2016-06-07 DIAGNOSIS — R031 Nonspecific low blood-pressure reading: Secondary | ICD-10-CM | POA: Diagnosis not present

## 2016-06-07 LAB — URINALYSIS, ROUTINE W REFLEX MICROSCOPIC
BILIRUBIN URINE: NEGATIVE
GLUCOSE, UA: NEGATIVE mg/dL
KETONES UR: NEGATIVE mg/dL
Nitrite: NEGATIVE
PH: 5 (ref 5.0–8.0)
Protein, ur: NEGATIVE mg/dL
Specific Gravity, Urine: 1.012 (ref 1.005–1.030)

## 2016-06-07 LAB — CBC
HCT: 36.8 % (ref 36.0–46.0)
HEMOGLOBIN: 12.2 g/dL (ref 12.0–15.0)
MCH: 32.5 pg (ref 26.0–34.0)
MCHC: 33.2 g/dL (ref 30.0–36.0)
MCV: 98.1 fL (ref 78.0–100.0)
PLATELETS: 170 10*3/uL (ref 150–400)
RBC: 3.75 MIL/uL — AB (ref 3.87–5.11)
RDW: 12.9 % (ref 11.5–15.5)
WBC: 6.1 10*3/uL (ref 4.0–10.5)

## 2016-06-07 LAB — COMPREHENSIVE METABOLIC PANEL
ALK PHOS: 61 U/L (ref 38–126)
ALT: 18 U/L (ref 14–54)
ANION GAP: 8 (ref 5–15)
AST: 30 U/L (ref 15–41)
Albumin: 3.5 g/dL (ref 3.5–5.0)
BUN: 21 mg/dL — ABNORMAL HIGH (ref 6–20)
CALCIUM: 8.7 mg/dL — AB (ref 8.9–10.3)
CHLORIDE: 100 mmol/L — AB (ref 101–111)
CO2: 28 mmol/L (ref 22–32)
CREATININE: 1.19 mg/dL — AB (ref 0.44–1.00)
GFR, EST AFRICAN AMERICAN: 43 mL/min — AB (ref >60)
GFR, EST NON AFRICAN AMERICAN: 37 mL/min — AB (ref >60)
Glucose, Bld: 96 mg/dL (ref 65–99)
Potassium: 4 mmol/L (ref 3.5–5.1)
Sodium: 136 mmol/L (ref 135–145)
Total Bilirubin: 0.8 mg/dL (ref 0.3–1.2)
Total Protein: 6.5 g/dL (ref 6.5–8.1)

## 2016-06-07 LAB — INFLUENZA PANEL BY PCR (TYPE A & B)
Influenza A By PCR: NEGATIVE
Influenza B By PCR: NEGATIVE

## 2016-06-07 LAB — LIPASE, BLOOD: Lipase: 14 U/L (ref 11–51)

## 2016-06-07 LAB — I-STAT CG4 LACTIC ACID, ED: Lactic Acid, Venous: 1.64 mmol/L (ref 0.5–1.9)

## 2016-06-07 MED ORDER — SODIUM CHLORIDE 0.9 % IV SOLN
Freq: Once | INTRAVENOUS | Status: AC
  Administered 2016-06-07: 21:00:00 via INTRAVENOUS

## 2016-06-07 MED ORDER — ONDANSETRON HCL 4 MG/2ML IJ SOLN
4.0000 mg | Freq: Once | INTRAMUSCULAR | Status: AC
  Administered 2016-06-07: 4 mg via INTRAVENOUS
  Filled 2016-06-07: qty 2

## 2016-06-07 NOTE — ED Triage Notes (Signed)
Pt called EMS r/t N/V/D onset x2 days daughter had similar symptoms last week. Pt states low grade fever yesterday EMS VS 160/50 Temp 99.5 HR 70 RR 18 Sat 94% and CBG 125

## 2016-06-07 NOTE — ED Provider Notes (Addendum)
[facility] DEPT Provider Note   CSN: [id number] Arrival date & time: 06/07/16  1907     History   Chief Complaint Chief Complaint  Patient presents with  . Diarrhea    HPI Laura Zuniga is a 83 y.o. female.  She presents for evaluation of nausea vomiting diarrhea cough weakness and decreased appetite present for several days.  She is taking her usual medications without relief.  She has family members with the same problem.  She denies abdominal or back pain.  She has been able to walk but is using a walker, which is unusual for her.  There are no other known modifying factors.    HPI  Past Medical History:  Diagnosis Date  . Bradycardia    a. Amiodarone DC'd 12/16 >> b. Holter 1/17 - NSR, sinus brady, junctional rhythm, pause 2.4 s  . C. difficile colitis 2015  . CAD (coronary artery disease)    never had cardiac cath, CAD dx by positive ant. wall defect on Nuc. study 2007 >>  no cath due to high risk/advanced age  . Chronic diastolic CHF (congestive heart failure) ([place])    a. Echo 6/15 - Mild LVH, EF 55-60%, mild AI, mild MR, moderate LAE, mild RAE, PASP 52 mm Hg  . CKD (chronic kidney disease)   . COPD (chronic obstructive pulmonary disease) ([place])   . Diverticulitis   . GERD (gastroesophageal reflux disease)   . Hypertension   . Macular degeneration   . PAF (paroxysmal atrial fibrillation) ([place])    not optimal candidate for anticoagulation    Patient Active Problem List   Diagnosis Date Noted  . ILD (interstitial lung disease) ([place]) 02/17/2016  . Chronic cough 02/17/2016  . Cardiac pacemaker 02/16/2016  . Sinus node dysfunction ([place]) 05/05/2015  . Bradycardia 09/14/2014  . CKD (chronic kidney disease) 09/14/2014  . GERD (gastroesophageal reflux disease) 10/29/2013  . Hyponatremia 10/21/2013  . Acute blood loss anemia 10/01/2013  . Urinary retention 10/01/2013  . Chronic diastolic heart failure ([place]) 09/28/2013  . PAF (paroxysmal atrial fibrillation)  ([place]) 09/27/2013  . C. difficile colitis 09/22/2013  . Melena 09/22/2013  . ATN (acute tubular necrosis) ([place]) 09/21/2013  . Nonspecific abnormal unspecified cardiovascular function study 06/11/2013  . Syncope 06/28/2011  . Swelling of left lower extremity 06/28/2011  . COPD (chronic obstructive pulmonary disease) ([place]) 06/28/2011  . DEMENTIA, CCE, W/O BEHAVIORAL DISTURBANCE 01/17/2007  . RENAL STONE 12/04/2006  . HLD (hyperlipidemia) 07/15/2006  . DEPRESSION 07/15/2006  . BLINDNESS, LEFT EYE 07/15/2006  . Essential hypertension 07/15/2006  . CAD (coronary artery disease) 07/15/2006  . OSTEOPOROSIS 07/15/2006    Past Surgical History:  Procedure Laterality Date  . APPENDECTOMY    . CHOLECYSTECTOMY    . EP IMPLANTABLE DEVICE N/A 05/17/2015   Procedure: Pacemaker Implant;  Surgeon: Evans Lance, MD;  Location: [facility] LAB;  Service: Cardiovascular;  Laterality: N/A;  . ESOPHAGOGASTRODUODENOSCOPY N/A 09/22/2013   Procedure: ESOPHAGOGASTRODUODENOSCOPY (EGD);  Surgeon: Lear Ng, MD;  Location: [facility] ENDOSCOPY;  Service: Endoscopy;  Laterality: N/A;  . ESOPHAGOGASTRODUODENOSCOPY N/A 09/22/2013   Procedure: ESOPHAGOGASTRODUODENOSCOPY (EGD);  Surgeon: Lear Ng, MD;  Location: [facility] ENDOSCOPY;  Service: Endoscopy;  Laterality: N/A;  . EYE SURGERY    . FLEXIBLE SIGMOIDOSCOPY N/A 09/22/2013   Procedure: FLEXIBLE SIGMOIDOSCOPY;  Surgeon: Lear Ng, MD;  Location: [facility] ENDOSCOPY;  Service: Endoscopy;  Laterality: N/A;  . FLEXIBLE SIGMOIDOSCOPY N/A 09/22/2013   Procedure: FLEXIBLE SIGMOIDOSCOPY;  Surgeon: Lear Ng,  MD;  Location: [facility] ENDOSCOPY;  Service: Endoscopy;  Laterality: N/A;  unprepped/ egd first  . VAGINAL HYSTERECTOMY      OB History    No data available       Home Medications    Prior to Admission medications   Medication Sig Start Date End Date Taking? Authorizing Provider  acetaminophen (TYLENOL) 500 MG tablet Take 2 tablets (1,000 mg  total) by mouth every 6 (six) hours as needed for mild pain or moderate pain. 05/27/16  Yes Duffy Bruce, MD  albuterol (PROVENTIL HFA;VENTOLIN HFA) 108 (90 BASE) MCG/ACT inhaler Inhale 2 puffs into the lungs every 6 (six) hours as needed for wheezing or shortness of breath. 02/23/15  Yes Deloria Lair, NP  amiodarone (PACERONE) 100 MG tablet Take 1 tablet (100 mg total) by mouth daily. 01/10/16  Yes Burtis Junes, NP  Biotin 1 MG CAPS Take 1 mg by mouth daily.   Yes Historical Provider, MD  calcium-vitamin D (OSCAL WITH D) 500-200 MG-UNIT per tablet Take 1 tablet by mouth daily after lunch.    Yes Historical Provider, MD  clopidogrel (PLAVIX) 75 MG tablet TAKE ONE TABLET BY MOUTH ONCE DAILY Patient taking differently: TAKE 75 MG BY MOUTH ONCE DAILY 03/22/14  Yes Jettie Booze, MD  Cranberry 250 MG CAPS Take 250 mg by mouth daily after lunch.    Yes Historical Provider, MD  Dietary Management Product (TOZAL) CAPS Take 1 capsule by mouth 2 (two) times daily.   Yes Historical Provider, MD  docusate sodium (COLACE) 100 MG capsule Take 100 mg by mouth daily as needed for mild constipation.   Yes Historical Provider, MD  escitalopram (LEXAPRO) 10 MG tablet Take 10 mg by mouth daily.    Yes Historical Provider, MD  ferrous sulfate 325 (65 FE) MG tablet Take 325 mg by mouth daily after lunch.    Yes Historical Provider, MD  furosemide (LASIX) 40 MG tablet TAKE TWO TABLETS BY MOUTH IN THE MORNING, THEN TAKE ONE IN THE EVENING, YOU CAN TAKE AN EXTRA 40 MG IN THE EVENING IF NEEDED Patient taking differently: TAKE 80 MG BY MOUTH IN THE MORNING, THEN TAKE 40 MG IN THE AFTERNOON, YOU CAN TAKE AN EXTRA 40 MG IN THE EVENING IF NEEDED FOR SWELLING 05/13/14  Yes Jettie Booze, MD  hydrALAZINE (APRESOLINE) 25 MG tablet Take 1 tablet (25 mg total) by mouth 2 (two) times daily. 10/17/15  Yes Jettie Booze, MD  isosorbide mononitrate (IMDUR) 60 MG 24 hr tablet TAKE ONE TABLET BY MOUTH ONCE DAILY Patient  taking differently: TAKE 60 MG BY MOUTH ONCE DAILY 04/10/16  Yes Jettie Booze, MD  levothyroxine (SYNTHROID, LEVOTHROID) 50 MCG tablet Take 50 mcg by mouth daily before breakfast.  06/04/14  Yes Historical Provider, MD  Multiple Vitamins-Minerals (HAIR/SKIN/NAILS/BIOTIN) TABS Take 1 tablet by mouth daily after lunch.   Yes Historical Provider, MD  pantoprazole (PROTONIX) 40 MG tablet Take 40 mg by mouth daily after lunch.    Yes Historical Provider, MD  polyethylene glycol (MIRALAX / GLYCOLAX) packet Take 17 g by mouth daily as needed (constipation). Mix in 8 oz juice and drink   Yes Historical Provider, MD  potassium chloride SA (K-DUR,KLOR-CON) 20 MEQ tablet Take 20 mEq by mouth 2 (two) times daily.  05/20/14  Yes Historical Provider, MD  pravastatin (PRAVACHOL) 40 MG tablet Take 40 mg by mouth at bedtime.    Yes Historical Provider, MD  Tetrahydrozoline HCl (VISINE OP) Place 1 drop into  both eyes daily as needed (itching).   Yes Historical Provider, MD  vitamin C (ASCORBIC ACID) 500 MG tablet Take 500 mg by mouth daily after lunch.    Yes Historical Provider, MD  vitamin E 400 UNIT capsule Take 400 Units by mouth daily after lunch.   Yes Historical Provider, MD  albuterol (PROVENTIL) (2.5 MG/3ML) 0.083% nebulizer solution Take 3 mLs (2.5 mg total) by nebulization every 6 (six) hours as needed for wheezing or shortness of breath. Patient not taking: Reported on 05/27/2016 12/28/15   Alfonzo Beers, MD  fluticasone furoate-vilanterol (BREO ELLIPTA) 100-25 MCG/INH AEPB Inhale 1 puff into the lungs daily. Patient not taking: Reported on 05/27/2016 02/17/16   Collene Gobble, MD    Family History Family History  Problem Relation Age of Onset  . Heart attack Father 40  . Heart attack Sister   . Cancer Brother   . Cancer Brother   . Cirrhosis Brother   . Heart disease Brother   . Cancer Brother   . COPD Brother   . COPD Sister   . Cancer Sister   . Cancer Sister   . Hypertension Daughter   .  Stroke Neg Hx     Social History Social History  Substance Use Topics  . Smoking status: Never Smoker  . Smokeless tobacco: Never Used  . Alcohol use No     Allergies   Penicillins; 2,4-d dimethylamine (amisol); Ace inhibitors; Lanolin; and Sulfonamide derivatives   Review of Systems Review of Systems  All other systems reviewed and are negative.    Physical Exam Updated Vital Signs BP (!) 124/49   Pulse 63   Temp 98.2 F (36.8 C) (Oral)   Resp 16   SpO2 92%   Physical Exam  Constitutional: She is oriented to person, place, and time. She appears well-developed.  Elderly, frail  HENT:  Head: Normocephalic and atraumatic.  Eyes: Conjunctivae and EOM are normal. Pupils are equal, round, and reactive to light.  Neck: Normal range of motion and phonation normal. Neck supple.  Cardiovascular: Normal rate and regular rhythm.   Pulmonary/Chest: Effort normal and breath sounds normal. She exhibits no tenderness.  Occasional nonproductive cough.  Abdominal: Soft. She exhibits no distension. There is no tenderness. There is no guarding.  Musculoskeletal: Normal range of motion.  Trace edema lower legs bilaterally.  Indistinct bruising noted lower legs.  Neurological: She is alert and oriented to person, place, and time. She exhibits normal muscle tone.  Skin: Skin is warm and dry.  Psychiatric: She has a normal mood and affect. Her behavior is normal.  Nursing note and vitals reviewed.    ED Treatments / Results  Labs (all labs ordered are listed, but only abnormal results are displayed) Labs Reviewed  COMPREHENSIVE METABOLIC PANEL - Abnormal; Notable for the following:       Result Value   Chloride 100 (*)    BUN 21 (*)    Creatinine, Ser 1.19 (*)    Calcium 8.7 (*)    GFR calc non Af Amer 37 (*)    GFR calc Af Amer 43 (*)    All other components within normal limits  CBC - Abnormal; Notable for the following:    RBC 3.75 (*)    All other components within  normal limits  URINALYSIS, ROUTINE W REFLEX MICROSCOPIC - Abnormal; Notable for the following:    APPearance HAZY (*)    Hgb urine dipstick SMALL (*)    Leukocytes, UA TRACE (*)  Bacteria, UA RARE (*)    Squamous Epithelial / LPF 0-5 (*)    All other components within normal limits  LIPASE, BLOOD  INFLUENZA PANEL BY PCR (TYPE A & B)  I-STAT CG4 LACTIC ACID, ED    EKG  EKG Interpretation None       Radiology No results found.  Procedures Procedures (including critical care time)  Medications Ordered in ED Medications  ondansetron (ZOFRAN) injection 4 mg (4 mg Intravenous Given 06/07/16 2251)  0.9 %  sodium chloride infusion ( Intravenous New Bag/Given 06/07/16 2100)     Initial Impression / Assessment and Plan / ED Course  I have reviewed the triage vital signs and the nursing notes.  Pertinent labs & imaging results that were available during my care of the patient were reviewed by me and considered in my medical decision making (see chart for details).     Medications  ondansetron (ZOFRAN) injection 4 mg (4 mg Intravenous Given 06/07/16 2251)  0.9 %  sodium chloride infusion ( Intravenous New Bag/Given 06/07/16 2100)    Patient Vitals for the past 24 hrs:  BP Temp Temp src Pulse Resp SpO2  06/07/16 2300 (!) 124/49 - - 63 - 92 %  06/07/16 2236 (!) 134/54 - - 62 16 94 %  06/07/16 2108 112/74 - - 65 16 97 %  06/07/16 1925 (!) 144/51 - - 63 - 96 %  06/07/16 1924 (!) 144/51 98.2 F (36.8 C) Oral 61 16 95 %    11:35 PM Reevaluation with update and discussion. After initial assessment and treatment, an updated evaluation reveals she feels somewhat weak at this time but is able to drink 3 or 4 ounces of water easily.  She refused to drink more.  She is not currently hungry.  She has not vomited since she arrived here.  Patient updated on findings. Wynonna Fitzhenry L    12: 15-at this time the patient states that she feels better and wants to go home.  Patient's daughter is  concerned about several things including "crackles in her lungs, persistent diarrhea, I think that she has C. Difficile, I think her blood pressure is low."  I explained the current findings to the daughter and the patient.  Initially the daughter wanted a chest x-ray, and C. difficile testing but then stated that she wanted to take her mother home.  At this time is to believe that there is an acute bacterial infection or impending vascular collapse.  She does not appear to be in acute congestive heart failure  Final Clinical Impressions(s) / ED Diagnoses   Final diagnoses:  Diarrhea, unspecified type  Nausea and vomiting, intractability of vomiting not specified, unspecified vomiting type  Cough    Nausea vomiting diarrhea consistent with enteritis, nonspecific.  Doubt acute bacterial infection metabolic instability or impending vascular collapse.  Nursing Notes Reviewed/ Care Coordinated Applicable Imaging Reviewed Interpretation of Laboratory Data incorporated into ED treatment  The patient appears reasonably screened and/or stabilized for discharge and I doubt any other medical condition or other [facility] requiring further screening, evaluation, or treatment in the ED at this time prior to discharge.  Plan: Home Medications- continue usual medication; Home Treatments- rest, gradually advance diet; return here if the recommended treatment, does not improve the symptoms; Recommended follow up- PCP check up in 3-5 days, and prn      New Prescriptions New Prescriptions   No medications on file     Daleen Bo, MD 06/08/16 [phone]  Daleen Bo, MD 06/08/16 [phone]

## 2016-06-07 NOTE — ED Notes (Signed)
Bed: WA09 Expected date:  Expected time:  Means of arrival:  Comments: EMS 90's

## 2016-06-08 MED ORDER — LOPERAMIDE HCL 2 MG PO CAPS
2.0000 mg | ORAL_CAPSULE | Freq: Once | ORAL | Status: AC
  Administered 2016-06-08: 2 mg via ORAL

## 2016-06-08 MED ORDER — ONDANSETRON HCL 8 MG PO TABS: 8.0000 mg | ORAL_TABLET | Freq: Three times a day (TID) | ORAL | 0 refills | Status: DC | PRN

## 2016-06-08 MED ORDER — LOPERAMIDE HCL 2 MG PO CAPS
2.0000 mg | ORAL_CAPSULE | ORAL | Status: DC | PRN
  Filled 2016-06-08: qty 1

## 2016-06-08 NOTE — Discharge Instructions (Signed)
Start with a clear liquid diet and gradually advance to regular foods over 1-2 days.  Use Imodium or Kaopectate if needed for diarrhea.  Take Tylenol as needed for fever or pain.  As the vomiting improves, gradually restart her medications.  With her blood pressure being normal to slightly low, she may not need to have blood pressure medicines for another day or so.

## 2016-06-11 DIAGNOSIS — K529 Noninfective gastroenteritis and colitis, unspecified: Secondary | ICD-10-CM | POA: Diagnosis not present

## 2016-06-11 DIAGNOSIS — R269 Unspecified abnormalities of gait and mobility: Secondary | ICD-10-CM | POA: Diagnosis not present

## 2016-06-11 DIAGNOSIS — R05 Cough: Secondary | ICD-10-CM | POA: Diagnosis not present

## 2016-06-11 DIAGNOSIS — R531 Weakness: Secondary | ICD-10-CM | POA: Diagnosis not present

## 2016-07-01 ENCOUNTER — Encounter (HOSPITAL_COMMUNITY): Payer: Self-pay | Admitting: Emergency Medicine

## 2016-07-01 ENCOUNTER — Ambulatory Visit (INDEPENDENT_AMBULATORY_CARE_PROVIDER_SITE_OTHER)
Admission: EM | Admit: 2016-07-01 | Discharge: 2016-07-01 | Disposition: A | Discharge status: Home / Self Care | Payer: PPO | Source: Home / Self Care

## 2016-07-01 DIAGNOSIS — I495 Sick sinus syndrome: Secondary | ICD-10-CM

## 2016-07-01 DIAGNOSIS — I130 Hypertensive heart and chronic kidney disease with heart failure and stage 1 through stage 4 chronic kidney disease, or unspecified chronic kidney disease: Secondary | ICD-10-CM | POA: Insufficient documentation

## 2016-07-01 DIAGNOSIS — R1310 Dysphagia, unspecified: Secondary | ICD-10-CM | POA: Diagnosis not present

## 2016-07-01 DIAGNOSIS — D62 Acute posthemorrhagic anemia: Secondary | ICD-10-CM

## 2016-07-01 DIAGNOSIS — Z882 Allergy status to sulfonamides status: Secondary | ICD-10-CM | POA: Insufficient documentation

## 2016-07-01 DIAGNOSIS — N200 Calculus of kidney: Secondary | ICD-10-CM

## 2016-07-01 DIAGNOSIS — Z880 Allergy status to penicillin: Secondary | ICD-10-CM

## 2016-07-01 DIAGNOSIS — Z8619 Personal history of other infectious and parasitic diseases: Secondary | ICD-10-CM | POA: Diagnosis not present

## 2016-07-01 DIAGNOSIS — Z950 Presence of cardiac pacemaker: Secondary | ICD-10-CM | POA: Insufficient documentation

## 2016-07-01 DIAGNOSIS — I5033 Acute on chronic diastolic (congestive) heart failure: Secondary | ICD-10-CM | POA: Diagnosis not present

## 2016-07-01 DIAGNOSIS — R109 Unspecified abdominal pain: Secondary | ICD-10-CM

## 2016-07-01 DIAGNOSIS — F329 Major depressive disorder, single episode, unspecified: Secondary | ICD-10-CM | POA: Insufficient documentation

## 2016-07-01 DIAGNOSIS — I5032 Chronic diastolic (congestive) heart failure: Secondary | ICD-10-CM

## 2016-07-01 DIAGNOSIS — E785 Hyperlipidemia, unspecified: Secondary | ICD-10-CM | POA: Diagnosis not present

## 2016-07-01 DIAGNOSIS — R300 Dysuria: Secondary | ICD-10-CM | POA: Diagnosis not present

## 2016-07-01 DIAGNOSIS — M7989 Other specified soft tissue disorders: Secondary | ICD-10-CM | POA: Insufficient documentation

## 2016-07-01 DIAGNOSIS — F0390 Unspecified dementia without behavioral disturbance: Secondary | ICD-10-CM | POA: Insufficient documentation

## 2016-07-01 DIAGNOSIS — I2510 Atherosclerotic heart disease of native coronary artery without angina pectoris: Secondary | ICD-10-CM

## 2016-07-01 DIAGNOSIS — E871 Hypo-osmolality and hyponatremia: Secondary | ICD-10-CM | POA: Insufficient documentation

## 2016-07-01 DIAGNOSIS — H5462 Unqualified visual loss, left eye, normal vision right eye: Secondary | ICD-10-CM | POA: Insufficient documentation

## 2016-07-01 DIAGNOSIS — E039 Hypothyroidism, unspecified: Secondary | ICD-10-CM | POA: Diagnosis not present

## 2016-07-01 DIAGNOSIS — E860 Dehydration: Secondary | ICD-10-CM | POA: Diagnosis not present

## 2016-07-01 DIAGNOSIS — I272 Pulmonary hypertension, unspecified: Secondary | ICD-10-CM | POA: Diagnosis not present

## 2016-07-01 DIAGNOSIS — D509 Iron deficiency anemia, unspecified: Secondary | ICD-10-CM | POA: Diagnosis not present

## 2016-07-01 DIAGNOSIS — R1030 Lower abdominal pain, unspecified: Secondary | ICD-10-CM

## 2016-07-01 DIAGNOSIS — B9689 Other specified bacterial agents as the cause of diseases classified elsewhere: Secondary | ICD-10-CM | POA: Diagnosis not present

## 2016-07-01 DIAGNOSIS — A419 Sepsis, unspecified organism: Secondary | ICD-10-CM | POA: Diagnosis not present

## 2016-07-01 DIAGNOSIS — J849 Interstitial pulmonary disease, unspecified: Secondary | ICD-10-CM | POA: Insufficient documentation

## 2016-07-01 DIAGNOSIS — A0472 Enterocolitis due to Clostridium difficile, not specified as recurrent: Secondary | ICD-10-CM

## 2016-07-01 DIAGNOSIS — J449 Chronic obstructive pulmonary disease, unspecified: Secondary | ICD-10-CM

## 2016-07-01 DIAGNOSIS — T502X5A Adverse effect of carbonic-anhydrase inhibitors, benzothiadiazides and other diuretics, initial encounter: Secondary | ICD-10-CM | POA: Diagnosis not present

## 2016-07-01 DIAGNOSIS — I480 Paroxysmal atrial fibrillation: Secondary | ICD-10-CM | POA: Insufficient documentation

## 2016-07-01 DIAGNOSIS — N390 Urinary tract infection, site not specified: Secondary | ICD-10-CM | POA: Diagnosis not present

## 2016-07-01 DIAGNOSIS — N170 Acute kidney failure with tubular necrosis: Secondary | ICD-10-CM | POA: Insufficient documentation

## 2016-07-01 DIAGNOSIS — R740 Nonspecific elevation of levels of transaminase and lactic acid dehydrogenase [LDH]: Secondary | ICD-10-CM | POA: Diagnosis not present

## 2016-07-01 DIAGNOSIS — K219 Gastro-esophageal reflux disease without esophagitis: Secondary | ICD-10-CM

## 2016-07-01 DIAGNOSIS — R55 Syncope and collapse: Secondary | ICD-10-CM

## 2016-07-01 DIAGNOSIS — Z9181 History of falling: Secondary | ICD-10-CM | POA: Diagnosis not present

## 2016-07-01 DIAGNOSIS — R0602 Shortness of breath: Secondary | ICD-10-CM | POA: Diagnosis not present

## 2016-07-01 DIAGNOSIS — H3530 Unspecified macular degeneration: Secondary | ICD-10-CM | POA: Insufficient documentation

## 2016-07-01 DIAGNOSIS — N183 Chronic kidney disease, stage 3 (moderate): Secondary | ICD-10-CM | POA: Diagnosis not present

## 2016-07-01 DIAGNOSIS — E876 Hypokalemia: Secondary | ICD-10-CM | POA: Diagnosis not present

## 2016-07-01 DIAGNOSIS — N3000 Acute cystitis without hematuria: Secondary | ICD-10-CM | POA: Diagnosis not present

## 2016-07-01 LAB — POCT URINALYSIS DIP (DEVICE)
Bilirubin Urine: NEGATIVE
GLUCOSE, UA: NEGATIVE mg/dL
KETONES UR: NEGATIVE mg/dL
Nitrite: NEGATIVE
PROTEIN: 100 mg/dL — AB
SPECIFIC GRAVITY, URINE: 1.02 (ref 1.005–1.030)
Urobilinogen, UA: 0.2 mg/dL (ref 0.0–1.0)
pH: 7 (ref 5.0–8.0)

## 2016-07-01 MED ORDER — CLOPIDOGREL BISULFATE 75 MG PO TABS: ORAL_TABLET | ORAL | 3 refills | Status: DC

## 2016-07-01 MED ORDER — NITROFURANTOIN MONOHYD MACRO 100 MG PO CAPS: 100.0000 mg | ORAL_CAPSULE | Freq: Two times a day (BID) | ORAL | 0 refills | Status: DC

## 2016-07-01 NOTE — ED Provider Notes (Signed)
[place]    CSN: [id number] Arrival date & time: 07/01/16  1450     History   Chief Complaint Chief Complaint  Patient presents with  . Dysuria  . Abdominal Pain    HPI Laura Zuniga is a 83 y.o. female.   The patient presented to the [facility] with a complaint of lower abdominal pain and dysuria x 6 days off and on. In recent days, she started developing some suprapubic discomfort and cramping as well as some confusion.      Past Medical History:  Diagnosis Date  . Bradycardia    a. Amiodarone DC'd 12/16 >> b. Holter 1/17 - NSR, sinus brady, junctional rhythm, pause 2.4 s  . C. difficile colitis 2015  . CAD (coronary artery disease)    never had cardiac cath, CAD dx by positive ant. wall defect on Nuc. study 2007 >>  no cath due to high risk/advanced age  . Chronic diastolic CHF (congestive heart failure) ([place])    a. Echo 6/15 - Mild LVH, EF 55-60%, mild AI, mild MR, moderate LAE, mild RAE, PASP 52 mm Hg  . CKD (chronic kidney disease)   . COPD (chronic obstructive pulmonary disease) ([place])   . Diverticulitis   . GERD (gastroesophageal reflux disease)   . Hypertension   . Macular degeneration   . PAF (paroxysmal atrial fibrillation) ([place])    not optimal candidate for anticoagulation    Patient Active Problem List   Diagnosis Date Noted  . ILD (interstitial lung disease) ([place]) 02/17/2016  . Chronic cough 02/17/2016  . Cardiac pacemaker 02/16/2016  . Sinus node dysfunction ([place]) 05/05/2015  . Bradycardia 09/14/2014  . CKD (chronic kidney disease) 09/14/2014  . GERD (gastroesophageal reflux disease) 10/29/2013  . Hyponatremia 10/21/2013  . Acute blood loss anemia 10/01/2013  . Urinary retention 10/01/2013  . Chronic diastolic heart failure ([place]) 09/28/2013  . PAF (paroxysmal atrial fibrillation) ([place]) 09/27/2013  . C. difficile colitis 09/22/2013  . Melena 09/22/2013  . ATN (acute tubular necrosis) ([place]) 09/21/2013  . Nonspecific abnormal  unspecified cardiovascular function study 06/11/2013  . Syncope 06/28/2011  . Swelling of left lower extremity 06/28/2011  . COPD (chronic obstructive pulmonary disease) ([place]) 06/28/2011  . DEMENTIA, CCE, W/O BEHAVIORAL DISTURBANCE 01/17/2007  . RENAL STONE 12/04/2006  . HLD (hyperlipidemia) 07/15/2006  . DEPRESSION 07/15/2006  . BLINDNESS, LEFT EYE 07/15/2006  . Essential hypertension 07/15/2006  . CAD (coronary artery disease) 07/15/2006  . OSTEOPOROSIS 07/15/2006    Past Surgical History:  Procedure Laterality Date  . APPENDECTOMY    . CHOLECYSTECTOMY    . EP IMPLANTABLE DEVICE N/A 05/17/2015   Procedure: Pacemaker Implant;  Surgeon: Evans Lance, MD;  Location: [facility];  Service: Cardiovascular;  Laterality: N/A;  . ESOPHAGOGASTRODUODENOSCOPY N/A 09/22/2013   Procedure: ESOPHAGOGASTRODUODENOSCOPY (EGD);  Surgeon: Lear Ng, MD;  Location: [facility] ENDOSCOPY;  Service: Endoscopy;  Laterality: N/A;  . ESOPHAGOGASTRODUODENOSCOPY N/A 09/22/2013   Procedure: ESOPHAGOGASTRODUODENOSCOPY (EGD);  Surgeon: Lear Ng, MD;  Location: [facility] ENDOSCOPY;  Service: Endoscopy;  Laterality: N/A;  . EYE SURGERY    . FLEXIBLE SIGMOIDOSCOPY N/A 09/22/2013   Procedure: FLEXIBLE SIGMOIDOSCOPY;  Surgeon: Lear Ng, MD;  Location: [facility] ENDOSCOPY;  Service: Endoscopy;  Laterality: N/A;  . FLEXIBLE SIGMOIDOSCOPY N/A 09/22/2013   Procedure: FLEXIBLE SIGMOIDOSCOPY;  Surgeon: Lear Ng, MD;  Location: [facility] ENDOSCOPY;  Service: Endoscopy;  Laterality: N/A;  unprepped/ egd first  . VAGINAL HYSTERECTOMY      OB  History    No data available       Home Medications    Prior to Admission medications   Medication Sig Start Date End Date Taking? Authorizing Provider  acetaminophen (TYLENOL) 500 MG tablet Take 2 tablets (1,000 mg total) by mouth every 6 (six) hours as needed for mild pain or moderate pain. 05/27/16   Duffy Bruce, MD  albuterol (PROVENTIL HFA;VENTOLIN HFA)  108 (90 BASE) MCG/ACT inhaler Inhale 2 puffs into the lungs every 6 (six) hours as needed for wheezing or shortness of breath. 02/23/15   Deloria Lair, NP  amiodarone (PACERONE) 100 MG tablet Take 1 tablet (100 mg total) by mouth daily. 01/10/16   Burtis Junes, NP  Biotin 1 MG CAPS Take 1 mg by mouth daily.    Historical Provider, MD  calcium-vitamin D (OSCAL WITH D) 500-200 MG-UNIT per tablet Take 1 tablet by mouth daily after lunch.     Historical Provider, MD  clopidogrel (PLAVIX) 75 MG tablet TAKE 75 MG BY MOUTH ONCE DAILY 07/01/16   Robyn Haber, MD  Cranberry 250 MG CAPS Take 250 mg by mouth daily after lunch.     Historical Provider, MD  Dietary Management Product (TOZAL) CAPS Take 1 capsule by mouth 2 (two) times daily.    Historical Provider, MD  docusate sodium (COLACE) 100 MG capsule Take 100 mg by mouth daily as needed for mild constipation.    Historical Provider, MD  escitalopram (LEXAPRO) 10 MG tablet Take 10 mg by mouth daily.     Historical Provider, MD  ferrous sulfate 325 (65 FE) MG tablet Take 325 mg by mouth daily after lunch.     Historical Provider, MD  furosemide (LASIX) 40 MG tablet TAKE TWO TABLETS BY MOUTH IN THE MORNING, THEN TAKE ONE IN THE EVENING, YOU CAN TAKE AN EXTRA 40 MG IN THE EVENING IF NEEDED Patient taking differently: TAKE 80 MG BY MOUTH IN THE MORNING, THEN TAKE 40 MG IN THE AFTERNOON, YOU CAN TAKE AN EXTRA 40 MG IN THE EVENING IF NEEDED FOR SWELLING 05/13/14   Jettie Booze, MD  hydrALAZINE (APRESOLINE) 25 MG tablet Take 1 tablet (25 mg total) by mouth 2 (two) times daily. 10/17/15   Jettie Booze, MD  isosorbide mononitrate (IMDUR) 60 MG 24 hr tablet TAKE ONE TABLET BY MOUTH ONCE DAILY Patient taking differently: TAKE 60 MG BY MOUTH ONCE DAILY 04/10/16   Jettie Booze, MD  levothyroxine (SYNTHROID, LEVOTHROID) 50 MCG tablet Take 50 mcg by mouth daily before breakfast.  06/04/14   Historical Provider, MD  Multiple Vitamins-Minerals  (HAIR/SKIN/NAILS/BIOTIN) TABS Take 1 tablet by mouth daily after lunch.    Historical Provider, MD  nitrofurantoin, macrocrystal-monohydrate, (MACROBID) 100 MG capsule Take 1 capsule (100 mg total) by mouth 2 (two) times daily. 07/01/16   Robyn Haber, MD  ondansetron (ZOFRAN) 8 MG tablet Take 1 tablet (8 mg total) by mouth every 8 (eight) hours as needed for nausea or vomiting. 06/08/16   Daleen Bo, MD  pantoprazole (PROTONIX) 40 MG tablet Take 40 mg by mouth daily after lunch.     Historical Provider, MD  polyethylene glycol (MIRALAX / GLYCOLAX) packet Take 17 g by mouth daily as needed (constipation). Mix in 8 oz juice and drink    Historical Provider, MD  potassium chloride SA (K-DUR,KLOR-CON) 20 MEQ tablet Take 20 mEq by mouth 2 (two) times daily.  05/20/14   Historical Provider, MD  pravastatin (PRAVACHOL) 40 MG tablet Take 40 mg by  mouth at bedtime.     Historical Provider, MD  Tetrahydrozoline HCl (VISINE OP) Place 1 drop into both eyes daily as needed (itching).    Historical Provider, MD  vitamin C (ASCORBIC ACID) 500 MG tablet Take 500 mg by mouth daily after lunch.     Historical Provider, MD  vitamin E 400 UNIT capsule Take 400 Units by mouth daily after lunch.    Historical Provider, MD    Family History Family History  Problem Relation Age of Onset  . Heart attack Father 73  . Heart attack Sister   . Cancer Brother   . Cancer Brother   . Cirrhosis Brother   . Heart disease Brother   . Cancer Brother   . COPD Brother   . COPD Sister   . Cancer Sister   . Cancer Sister   . Hypertension Daughter   . Stroke Neg Hx     Social History Social History  Substance Use Topics  . Smoking status: Never Smoker  . Smokeless tobacco: Never Used  . Alcohol use No     Allergies   Penicillins; 2,4-d dimethylamine (amisol); Ace inhibitors; Lanolin; and Sulfonamide derivatives   Review of Systems Review of Systems  Constitutional: Positive for chills.  HENT: Negative.     Respiratory: Negative.   Cardiovascular: Negative.   Gastrointestinal: Positive for abdominal pain.  Genitourinary: Positive for dysuria and frequency.  Psychiatric/Behavioral: Positive for confusion.     Physical Exam Triage Vital Signs ED Triage Vitals  Enc Vitals Group     BP 07/01/16 1547 (!) 147/49     Pulse Rate 07/01/16 1547 62     Resp 07/01/16 1547 18     Temp 07/01/16 1547 98.3 F (36.8 C)     Temp Source 07/01/16 1547 Oral     SpO2 07/01/16 1547 94 %     Weight --      Height --      Head Circumference --      Peak Flow --      Pain Score 07/01/16 1545 10     Pain Loc --      Pain Edu? --      Excl. in [place]? --    No data found.   Updated Vital Signs BP (!) 147/49 (BP Location: Right Arm)   Pulse 62   Temp 98.3 F (36.8 C) (Oral)   Resp 18   SpO2 94%    Physical Exam  Constitutional: She is oriented to person, place, and time. She appears well-developed and well-nourished.  HENT:  Right Ear: External ear normal.  Left Ear: External ear normal.  Mouth/Throat: Oropharynx is clear and moist.  Eyes: Conjunctivae and EOM are normal. Pupils are equal, round, and reactive to light.  Neck: Normal range of motion. Neck supple.  Pulmonary/Chest: Effort normal.  Abdominal: There is tenderness. There is no rebound and no guarding.  Musculoskeletal: Normal range of motion.  Neurological: She is alert and oriented to person, place, and time.  Skin: Skin is warm and dry.  Nursing note and vitals reviewed.    UC Treatments / Results  Labs (all labs ordered are listed, but only abnormal results are displayed) Labs Reviewed  POCT URINALYSIS DIP (DEVICE) - Abnormal; Notable for the following:       Result Value   Hgb urine dipstick MODERATE (*)    Protein, ur 100 (*)    Leukocytes, UA LARGE (*)    All other components within normal limits  URINE CULTURE    EKG  EKG Interpretation None       Radiology No results found.  Procedures Procedures  (including critical care time)  Medications Ordered in UC Medications - No data to display   Initial Impression / Assessment and Plan / UC Course  I have reviewed the triage vital signs and the nursing notes.  Pertinent labs & imaging results that were available during my care of the patient were reviewed by me and considered in my medical decision making (see chart for details).     Final Clinical Impressions(s) / UC Diagnoses   Final diagnoses:  Lower urinary tract infectious disease    New Prescriptions New Prescriptions   NITROFURANTOIN, MACROCRYSTAL-MONOHYDRATE, (MACROBID) 100 MG CAPSULE    Take 1 capsule (100 mg total) by mouth 2 (two) times daily.     Robyn Haber, MD 07/01/16 [phone]

## 2016-07-01 NOTE — Discharge Instructions (Signed)
The urine test shows that there is definitely a significant urinary tract infection.

## 2016-07-01 NOTE — ED Triage Notes (Signed)
The patient presented to the [facility] with a complaint of lower abdominal pain and dysuria x 2 days off and on.

## 2016-07-02 LAB — URINE CULTURE: Culture: <10000 — AB

## 2016-07-03 ENCOUNTER — Inpatient Hospital Stay (HOSPITAL_COMMUNITY): Payer: PPO

## 2016-07-03 ENCOUNTER — Encounter (HOSPITAL_COMMUNITY): Payer: Self-pay

## 2016-07-03 ENCOUNTER — Emergency Department (HOSPITAL_COMMUNITY): Payer: PPO

## 2016-07-03 ENCOUNTER — Inpatient Hospital Stay (HOSPITAL_COMMUNITY)
Admission: EM | Admit: 2016-07-03 | Discharge: 2016-07-06 | DRG: 871 | Disposition: A | Discharge status: Home Health | Payer: PPO | Attending: Family Medicine | Admitting: Family Medicine

## 2016-07-03 DIAGNOSIS — N3000 Acute cystitis without hematuria: Secondary | ICD-10-CM

## 2016-07-03 DIAGNOSIS — J849 Interstitial pulmonary disease, unspecified: Secondary | ICD-10-CM | POA: Diagnosis not present

## 2016-07-03 DIAGNOSIS — R509 Fever, unspecified: Secondary | ICD-10-CM | POA: Diagnosis not present

## 2016-07-03 DIAGNOSIS — R7989 Other specified abnormal findings of blood chemistry: Secondary | ICD-10-CM | POA: Diagnosis not present

## 2016-07-03 DIAGNOSIS — B9689 Other specified bacterial agents as the cause of diseases classified elsewhere: Secondary | ICD-10-CM | POA: Diagnosis not present

## 2016-07-03 DIAGNOSIS — R740 Nonspecific elevation of levels of transaminase and lactic acid dehydrogenase [LDH]: Secondary | ICD-10-CM | POA: Diagnosis present

## 2016-07-03 DIAGNOSIS — I480 Paroxysmal atrial fibrillation: Secondary | ICD-10-CM | POA: Diagnosis present

## 2016-07-03 DIAGNOSIS — E785 Hyperlipidemia, unspecified: Secondary | ICD-10-CM | POA: Diagnosis present

## 2016-07-03 DIAGNOSIS — R7401 Elevation of levels of liver transaminase levels: Secondary | ICD-10-CM | POA: Diagnosis present

## 2016-07-03 DIAGNOSIS — I2510 Atherosclerotic heart disease of native coronary artery without angina pectoris: Secondary | ICD-10-CM | POA: Diagnosis present

## 2016-07-03 DIAGNOSIS — R1310 Dysphagia, unspecified: Secondary | ICD-10-CM | POA: Diagnosis present

## 2016-07-03 DIAGNOSIS — A419 Sepsis, unspecified organism: Secondary | ICD-10-CM | POA: Diagnosis not present

## 2016-07-03 DIAGNOSIS — E876 Hypokalemia: Secondary | ICD-10-CM | POA: Diagnosis present

## 2016-07-03 DIAGNOSIS — I25119 Atherosclerotic heart disease of native coronary artery with unspecified angina pectoris: Secondary | ICD-10-CM | POA: Diagnosis not present

## 2016-07-03 DIAGNOSIS — Z9181 History of falling: Secondary | ICD-10-CM

## 2016-07-03 DIAGNOSIS — E039 Hypothyroidism, unspecified: Secondary | ICD-10-CM | POA: Diagnosis present

## 2016-07-03 DIAGNOSIS — N183 Chronic kidney disease, stage 3 unspecified: Secondary | ICD-10-CM | POA: Diagnosis present

## 2016-07-03 DIAGNOSIS — T502X5A Adverse effect of carbonic-anhydrase inhibitors, benzothiadiazides and other diuretics, initial encounter: Secondary | ICD-10-CM | POA: Diagnosis present

## 2016-07-03 DIAGNOSIS — J410 Simple chronic bronchitis: Secondary | ICD-10-CM

## 2016-07-03 DIAGNOSIS — K219 Gastro-esophageal reflux disease without esophagitis: Secondary | ICD-10-CM | POA: Diagnosis present

## 2016-07-03 DIAGNOSIS — Z79899 Other long term (current) drug therapy: Secondary | ICD-10-CM

## 2016-07-03 DIAGNOSIS — I5032 Chronic diastolic (congestive) heart failure: Secondary | ICD-10-CM | POA: Diagnosis not present

## 2016-07-03 DIAGNOSIS — H3530 Unspecified macular degeneration: Secondary | ICD-10-CM | POA: Diagnosis present

## 2016-07-03 DIAGNOSIS — D509 Iron deficiency anemia, unspecified: Secondary | ICD-10-CM | POA: Diagnosis present

## 2016-07-03 DIAGNOSIS — Z7902 Long term (current) use of antithrombotics/antiplatelets: Secondary | ICD-10-CM

## 2016-07-03 DIAGNOSIS — Z880 Allergy status to penicillin: Secondary | ICD-10-CM

## 2016-07-03 DIAGNOSIS — I130 Hypertensive heart and chronic kidney disease with heart failure and stage 1 through stage 4 chronic kidney disease, or unspecified chronic kidney disease: Secondary | ICD-10-CM | POA: Diagnosis present

## 2016-07-03 DIAGNOSIS — R8290 Unspecified abnormal findings in urine: Secondary | ICD-10-CM | POA: Diagnosis not present

## 2016-07-03 DIAGNOSIS — Z888 Allergy status to other drugs, medicaments and biological substances status: Secondary | ICD-10-CM

## 2016-07-03 DIAGNOSIS — E784 Other hyperlipidemia: Secondary | ICD-10-CM

## 2016-07-03 DIAGNOSIS — J449 Chronic obstructive pulmonary disease, unspecified: Secondary | ICD-10-CM | POA: Diagnosis present

## 2016-07-03 DIAGNOSIS — N390 Urinary tract infection, site not specified: Secondary | ICD-10-CM | POA: Diagnosis present

## 2016-07-03 DIAGNOSIS — Z8619 Personal history of other infectious and parasitic diseases: Secondary | ICD-10-CM | POA: Diagnosis not present

## 2016-07-03 DIAGNOSIS — Z882 Allergy status to sulfonamides status: Secondary | ICD-10-CM

## 2016-07-03 DIAGNOSIS — I272 Pulmonary hypertension, unspecified: Secondary | ICD-10-CM | POA: Diagnosis present

## 2016-07-03 DIAGNOSIS — I509 Heart failure, unspecified: Secondary | ICD-10-CM

## 2016-07-03 DIAGNOSIS — Z950 Presence of cardiac pacemaker: Secondary | ICD-10-CM | POA: Diagnosis not present

## 2016-07-03 DIAGNOSIS — I5033 Acute on chronic diastolic (congestive) heart failure: Secondary | ICD-10-CM | POA: Diagnosis present

## 2016-07-03 DIAGNOSIS — R945 Abnormal results of liver function studies: Secondary | ICD-10-CM

## 2016-07-03 DIAGNOSIS — E860 Dehydration: Secondary | ICD-10-CM | POA: Diagnosis present

## 2016-07-03 DIAGNOSIS — R0602 Shortness of breath: Secondary | ICD-10-CM | POA: Diagnosis not present

## 2016-07-03 DIAGNOSIS — I10 Essential (primary) hypertension: Secondary | ICD-10-CM | POA: Diagnosis present

## 2016-07-03 LAB — CBC WITH DIFFERENTIAL/PLATELET
Basophils Absolute: 0 10*3/uL (ref 0.0–0.1)
Basophils Relative: 0 %
EOS ABS: 0 10*3/uL (ref 0.0–0.7)
EOS PCT: 0 %
HCT: 37.1 % (ref 36.0–46.0)
Hemoglobin: 12.2 g/dL (ref 12.0–15.0)
LYMPHS ABS: 0.5 10*3/uL — AB (ref 0.7–4.0)
LYMPHS PCT: 4 %
MCH: 32.5 pg (ref 26.0–34.0)
MCHC: 32.9 g/dL (ref 30.0–36.0)
MCV: 98.9 fL (ref 78.0–100.0)
MONOS PCT: 4 %
Monocytes Absolute: 0.5 10*3/uL (ref 0.1–1.0)
Neutro Abs: 11.5 10*3/uL — ABNORMAL HIGH (ref 1.7–7.7)
Neutrophils Relative %: 92 %
PLATELETS: 205 10*3/uL (ref 150–400)
RBC: 3.75 MIL/uL — ABNORMAL LOW (ref 3.87–5.11)
RDW: 13.1 % (ref 11.5–15.5)
WBC: 12.5 10*3/uL — ABNORMAL HIGH (ref 4.0–10.5)

## 2016-07-03 LAB — RESPIRATORY PANEL BY PCR
ADENOVIRUS-RVPPCR: NOT DETECTED
Bordetella pertussis: NOT DETECTED
CORONAVIRUS NL63-RVPPCR: NOT DETECTED
CORONAVIRUS OC43-RVPPCR: NOT DETECTED
Chlamydophila pneumoniae: NOT DETECTED
Coronavirus 229E: NOT DETECTED
Coronavirus HKU1: NOT DETECTED
INFLUENZA A-RVPPCR: NOT DETECTED
INFLUENZA B-RVPPCR: NOT DETECTED
METAPNEUMOVIRUS-RVPPCR: NOT DETECTED
MYCOPLASMA PNEUMONIAE-RVPPCR: NOT DETECTED
PARAINFLUENZA VIRUS 1-RVPPCR: NOT DETECTED
PARAINFLUENZA VIRUS 3-RVPPCR: NOT DETECTED
PARAINFLUENZA VIRUS 4-RVPPCR: NOT DETECTED
Parainfluenza Virus 2: NOT DETECTED
RESPIRATORY SYNCYTIAL VIRUS-RVPPCR: NOT DETECTED
RHINOVIRUS / ENTEROVIRUS - RVPPCR: NOT DETECTED

## 2016-07-03 LAB — URINALYSIS, ROUTINE W REFLEX MICROSCOPIC
BILIRUBIN URINE: NEGATIVE
Glucose, UA: NEGATIVE mg/dL
HGB URINE DIPSTICK: NEGATIVE
Ketones, ur: NEGATIVE mg/dL
NITRITE: NEGATIVE
PROTEIN: NEGATIVE mg/dL
SPECIFIC GRAVITY, URINE: 1.011 (ref 1.005–1.030)
pH: 5 (ref 5.0–8.0)

## 2016-07-03 LAB — COMPREHENSIVE METABOLIC PANEL
ALBUMIN: 3.2 g/dL — AB (ref 3.5–5.0)
ALK PHOS: 131 U/L — AB (ref 38–126)
ALT: 145 U/L — AB (ref 14–54)
AST: 224 U/L — AB (ref 15–41)
Anion gap: 11 (ref 5–15)
BILIRUBIN TOTAL: 0.8 mg/dL (ref 0.3–1.2)
BUN: 14 mg/dL (ref 6–20)
CALCIUM: 8.9 mg/dL (ref 8.9–10.3)
CO2: 27 mmol/L (ref 22–32)
CREATININE: 1.12 mg/dL — AB (ref 0.44–1.00)
Chloride: 97 mmol/L — ABNORMAL LOW (ref 101–111)
GFR calc Af Amer: 47 mL/min — ABNORMAL LOW (ref >60)
GFR calc non Af Amer: 40 mL/min — ABNORMAL LOW (ref >60)
GLUCOSE: 137 mg/dL — AB (ref 65–99)
Potassium: 4 mmol/L (ref 3.5–5.1)
Sodium: 135 mmol/L (ref 135–145)
TOTAL PROTEIN: 6.5 g/dL (ref 6.5–8.1)

## 2016-07-03 LAB — BRAIN NATRIURETIC PEPTIDE: B NATRIURETIC PEPTIDE 5: 401.6 pg/mL — AB (ref 0.0–100.0)

## 2016-07-03 LAB — SEDIMENTATION RATE: Sed Rate: 59 mm/hr — ABNORMAL HIGH (ref 0–22)

## 2016-07-03 LAB — I-STAT TROPONIN, ED: Troponin i, poc: 0.03 ng/mL (ref 0.00–0.08)

## 2016-07-03 LAB — PHOSPHORUS: PHOSPHORUS: 2.5 mg/dL (ref 2.5–4.6)

## 2016-07-03 LAB — PROTIME-INR
INR: 1.02
PROTHROMBIN TIME: 13.4 s (ref 11.4–15.2)

## 2016-07-03 LAB — PROCALCITONIN: Procalcitonin: 0.26 ng/mL

## 2016-07-03 LAB — I-STAT CG4 LACTIC ACID, ED
LACTIC ACID, VENOUS: 1.85 mmol/L (ref 0.5–1.9)
Lactic Acid, Venous: 2.41 mmol/L (ref 0.5–1.9)

## 2016-07-03 LAB — INFLUENZA PANEL BY PCR (TYPE A & B)
Influenza A By PCR: NEGATIVE
Influenza B By PCR: NEGATIVE

## 2016-07-03 LAB — TSH: TSH: 1.58 u[IU]/mL (ref 0.350–4.500)

## 2016-07-03 LAB — MAGNESIUM: Magnesium: 1.7 mg/dL (ref 1.7–2.4)

## 2016-07-03 MED ORDER — ESCITALOPRAM OXALATE 10 MG PO TABS
10.0000 mg | ORAL_TABLET | Freq: Every day | ORAL | Status: DC
  Administered 2016-07-03 – 2016-07-06 (×4): 10 mg via ORAL
  Filled 2016-07-03 (×4): qty 1

## 2016-07-03 MED ORDER — CLOPIDOGREL BISULFATE 75 MG PO TABS
75.0000 mg | ORAL_TABLET | Freq: Every day | ORAL | Status: DC
  Administered 2016-07-03 – 2016-07-06 (×4): 75 mg via ORAL
  Filled 2016-07-03 (×4): qty 1

## 2016-07-03 MED ORDER — SODIUM CHLORIDE 0.9 % IV BOLUS (SEPSIS): 250.0000 mL | Freq: Once | INTRAVENOUS | Status: DC

## 2016-07-03 MED ORDER — SODIUM CHLORIDE 0.9 % IV BOLUS (SEPSIS)
1000.0000 mL | Freq: Once | INTRAVENOUS | Status: AC
  Administered 2016-07-03: 1000 mL via INTRAVENOUS

## 2016-07-03 MED ORDER — SODIUM CHLORIDE 0.9 % IV SOLN
1000.0000 mL | INTRAVENOUS | Status: DC
  Administered 2016-07-03: 1000 mL via INTRAVENOUS

## 2016-07-03 MED ORDER — SODIUM CHLORIDE 0.9 % IV SOLN
INTRAVENOUS | Status: DC
  Administered 2016-07-03 (×2): via INTRAVENOUS

## 2016-07-03 MED ORDER — SODIUM CHLORIDE 0.9 % IV SOLN
INTRAVENOUS | Status: DC
  Administered 2016-07-03: 08:00:00 via INTRAVENOUS

## 2016-07-03 MED ORDER — ALBUTEROL SULFATE HFA 108 (90 BASE) MCG/ACT IN AERS: 2.0000 | INHALATION_SPRAY | Freq: Four times a day (QID) | RESPIRATORY_TRACT | Status: DC | PRN

## 2016-07-03 MED ORDER — SODIUM CHLORIDE 0.9% FLUSH
3.0000 mL | Freq: Two times a day (BID) | INTRAVENOUS | Status: DC
  Administered 2016-07-04 – 2016-07-06 (×4): 3 mL via INTRAVENOUS

## 2016-07-03 MED ORDER — VITAMIN E 180 MG (400 UNIT) PO CAPS
400.0000 [IU] | ORAL_CAPSULE | Freq: Every day | ORAL | Status: DC
  Administered 2016-07-03 – 2016-07-06 (×4): 400 [IU] via ORAL
  Filled 2016-07-03 (×4): qty 1

## 2016-07-03 MED ORDER — ENOXAPARIN SODIUM 30 MG/0.3ML ~~LOC~~ SOLN
30.0000 mg | SUBCUTANEOUS | Status: DC
  Administered 2016-07-04 – 2016-07-05 (×2): 30 mg via SUBCUTANEOUS
  Filled 2016-07-03 (×2): qty 0.3

## 2016-07-03 MED ORDER — ALBUTEROL SULFATE (2.5 MG/3ML) 0.083% IN NEBU
2.5000 mg | INHALATION_SOLUTION | Freq: Four times a day (QID) | RESPIRATORY_TRACT | Status: DC | PRN
  Administered 2016-07-03 – 2016-07-06 (×6): 2.5 mg via RESPIRATORY_TRACT
  Filled 2016-07-03 (×7): qty 3

## 2016-07-03 MED ORDER — LACTINEX PO CHEW
1.0000 | CHEWABLE_TABLET | Freq: Three times a day (TID) | ORAL | Status: DC
  Administered 2016-07-04 – 2016-07-06 (×7): 1 via ORAL
  Filled 2016-07-03 (×15): qty 1

## 2016-07-03 MED ORDER — LEVOTHYROXINE SODIUM 50 MCG PO TABS
50.0000 ug | ORAL_TABLET | Freq: Every day | ORAL | Status: DC
  Administered 2016-07-04 – 2016-07-06 (×3): 50 ug via ORAL
  Filled 2016-07-03 (×3): qty 1

## 2016-07-03 MED ORDER — DEXTROSE 5 % IV SOLN
2.0000 g | Freq: Once | INTRAVENOUS | Status: AC
  Administered 2016-07-03: 2 g via INTRAVENOUS
  Filled 2016-07-03: qty 2

## 2016-07-03 MED ORDER — ACETAMINOPHEN 325 MG PO TABS: 650.0000 mg | ORAL_TABLET | Freq: Four times a day (QID) | ORAL | Status: DC | PRN

## 2016-07-03 MED ORDER — VANCOMYCIN HCL IN DEXTROSE 1-5 GM/200ML-% IV SOLN
1000.0000 mg | Freq: Once | INTRAVENOUS | Status: AC
  Administered 2016-07-03: 1000 mg via INTRAVENOUS
  Filled 2016-07-03: qty 200

## 2016-07-03 MED ORDER — ISOSORBIDE MONONITRATE ER 60 MG PO TB24
60.0000 mg | ORAL_TABLET | Freq: Every day | ORAL | Status: DC
  Administered 2016-07-04 – 2016-07-06 (×3): 60 mg via ORAL
  Filled 2016-07-03 (×3): qty 1

## 2016-07-03 MED ORDER — HYDRALAZINE HCL 25 MG PO TABS
25.0000 mg | ORAL_TABLET | Freq: Two times a day (BID) | ORAL | Status: DC
  Administered 2016-07-04 – 2016-07-06 (×5): 25 mg via ORAL
  Filled 2016-07-03 (×5): qty 1

## 2016-07-03 MED ORDER — LEVOFLOXACIN IN D5W 750 MG/150ML IV SOLN
750.0000 mg | Freq: Once | INTRAVENOUS | Status: AC
  Administered 2016-07-03: 750 mg via INTRAVENOUS
  Filled 2016-07-03: qty 150

## 2016-07-03 MED ORDER — VITAMIN C 500 MG PO TABS
500.0000 mg | ORAL_TABLET | Freq: Every day | ORAL | Status: DC
  Administered 2016-07-03 – 2016-07-06 (×4): 500 mg via ORAL
  Filled 2016-07-03 (×4): qty 1

## 2016-07-03 MED ORDER — ACETAMINOPHEN 650 MG RE SUPP: 650.0000 mg | Freq: Four times a day (QID) | RECTAL | Status: DC | PRN

## 2016-07-03 MED ORDER — FERROUS SULFATE 325 (65 FE) MG PO TABS
325.0000 mg | ORAL_TABLET | Freq: Every day | ORAL | Status: DC
  Administered 2016-07-03 – 2016-07-06 (×4): 325 mg via ORAL
  Filled 2016-07-03 (×4): qty 1

## 2016-07-03 MED ORDER — DEXTROSE 5 % IV SOLN
1.0000 g | INTRAVENOUS | Status: DC
  Administered 2016-07-04 – 2016-07-05 (×2): 1 g via INTRAVENOUS
  Filled 2016-07-03 (×2): qty 10

## 2016-07-03 MED ORDER — BIOTIN 1 MG PO CAPS: 1.0000 mg | ORAL_CAPSULE | Freq: Every day | ORAL | Status: DC

## 2016-07-03 NOTE — ED Notes (Signed)
Per Dr. Marily Memos, to only give one liter. Pt had 600cc with first liter, will run 400 more with second

## 2016-07-03 NOTE — ED Notes (Signed)
Diet tray ordered for pt 

## 2016-07-03 NOTE — Progress Notes (Signed)
Follow-up lactic acid has normalized therefore will change from telemetry to unmonitored floor status. Order placed in [place], ANP

## 2016-07-03 NOTE — ED Notes (Signed)
[id number] 

## 2016-07-03 NOTE — ED Provider Notes (Signed)
TIME SEEN: 5:39 AM  CHIEF COMPLAINT: Fever, cough, dysuria  HPI: Patient is a 83 year old female with history of proximal atrial fibrillation not on anticoagulation, CAD on Plavix, hypertension, chronic kidney disease, CHF status post pacemaker who presents to the emergency department with complaints of fever for the past 2-3 days. Has also had intermittent dysuria and was seen at urgent care on 07/01/16 and was diagnosed with urinary tract infection and placed on Macrobid. Reports she feels like she is not getting better. Denies any pain at this time. No nausea, vomiting or diarrhea. No chest pain or shortness of breath. Daughter reports she has documented history of COPD but recently had pulmonary function tests and was told that she does not have COPD. She is not on oxygen at home. She does have a history of C. difficile colitis. At this time she is a full code. Daughter also reports she began coughing yesterday.  ROS: See HPI Constitutional:  fever  Eyes: no drainage  ENT: no runny nose   Cardiovascular:  no chest pain  Resp: no SOB  GI: no vomiting GU: no dysuria Integumentary: no rash  Allergy: no hives  Musculoskeletal: no leg swelling  Neurological: no slurred speech ROS otherwise negative  PAST MEDICAL HISTORY/PAST SURGICAL HISTORY:  Past Medical History:  Diagnosis Date  . Bradycardia    a. Amiodarone DC'd 12/16 >> b. Holter 1/17 - NSR, sinus brady, junctional rhythm, pause 2.4 s  . C. difficile colitis 2015  . CAD (coronary artery disease)    never had cardiac cath, CAD dx by positive ant. wall defect on Nuc. study 2007 >>  no cath due to high risk/advanced age  . Chronic diastolic CHF (congestive heart failure) ([place])    a. Echo 6/15 - Mild LVH, EF 55-60%, mild AI, mild MR, moderate LAE, mild RAE, PASP 52 mm Hg  . CKD (chronic kidney disease)   . COPD (chronic obstructive pulmonary disease) ([place])   . Diverticulitis   . GERD (gastroesophageal reflux disease)   .  Hypertension   . Macular degeneration   . PAF (paroxysmal atrial fibrillation) ([facility])    not optimal candidate for anticoagulation    MEDICATIONS:  Prior to Admission medications   Medication Sig Start Date End Date Taking? Authorizing Provider  acetaminophen (TYLENOL) 500 MG tablet Take 2 tablets (1,000 mg total) by mouth every 6 (six) hours as needed for mild pain or moderate pain. 05/27/16   Duffy Bruce, MD  albuterol (PROVENTIL HFA;VENTOLIN HFA) 108 (90 BASE) MCG/ACT inhaler Inhale 2 puffs into the lungs every 6 (six) hours as needed for wheezing or shortness of breath. 02/23/15   Deloria Lair, NP  amiodarone (PACERONE) 100 MG tablet Take 1 tablet (100 mg total) by mouth daily. 01/10/16   Burtis Junes, NP  Biotin 1 MG CAPS Take 1 mg by mouth daily.    Historical Provider, MD  calcium-vitamin D (OSCAL WITH D) 500-200 MG-UNIT per tablet Take 1 tablet by mouth daily after lunch.     Historical Provider, MD  clopidogrel (PLAVIX) 75 MG tablet TAKE 75 MG BY MOUTH ONCE DAILY 07/01/16   Robyn Haber, MD  Cranberry 250 MG CAPS Take 250 mg by mouth daily after lunch.     Historical Provider, MD  Dietary Management Product (TOZAL) CAPS Take 1 capsule by mouth 2 (two) times daily.    Historical Provider, MD  docusate sodium (COLACE) 100 MG capsule Take 100 mg by mouth daily as needed for mild constipation.  Historical Provider, MD  escitalopram (LEXAPRO) 10 MG tablet Take 10 mg by mouth daily.     Historical Provider, MD  ferrous sulfate 325 (65 FE) MG tablet Take 325 mg by mouth daily after lunch.     Historical Provider, MD  furosemide (LASIX) 40 MG tablet TAKE TWO TABLETS BY MOUTH IN THE MORNING, THEN TAKE ONE IN THE EVENING, YOU CAN TAKE AN EXTRA 40 MG IN THE EVENING IF NEEDED Patient taking differently: TAKE 80 MG BY MOUTH IN THE MORNING, THEN TAKE 40 MG IN THE AFTERNOON, YOU CAN TAKE AN EXTRA 40 MG IN THE EVENING IF NEEDED FOR SWELLING 05/13/14   Jettie Booze, MD  hydrALAZINE  (APRESOLINE) 25 MG tablet Take 1 tablet (25 mg total) by mouth 2 (two) times daily. 10/17/15   Jettie Booze, MD  isosorbide mononitrate (IMDUR) 60 MG 24 hr tablet TAKE ONE TABLET BY MOUTH ONCE DAILY Patient taking differently: TAKE 60 MG BY MOUTH ONCE DAILY 04/10/16   Jettie Booze, MD  levothyroxine (SYNTHROID, LEVOTHROID) 50 MCG tablet Take 50 mcg by mouth daily before breakfast.  06/04/14   Historical Provider, MD  Multiple Vitamins-Minerals (HAIR/SKIN/NAILS/BIOTIN) TABS Take 1 tablet by mouth daily after lunch.    Historical Provider, MD  nitrofurantoin, macrocrystal-monohydrate, (MACROBID) 100 MG capsule Take 1 capsule (100 mg total) by mouth 2 (two) times daily. 07/01/16   Robyn Haber, MD  ondansetron (ZOFRAN) 8 MG tablet Take 1 tablet (8 mg total) by mouth every 8 (eight) hours as needed for nausea or vomiting. 06/08/16   Daleen Bo, MD  pantoprazole (PROTONIX) 40 MG tablet Take 40 mg by mouth daily after lunch.     Historical Provider, MD  polyethylene glycol (MIRALAX / GLYCOLAX) packet Take 17 g by mouth daily as needed (constipation). Mix in 8 oz juice and drink    Historical Provider, MD  potassium chloride SA (K-DUR,KLOR-CON) 20 MEQ tablet Take 20 mEq by mouth 2 (two) times daily.  05/20/14   Historical Provider, MD  pravastatin (PRAVACHOL) 40 MG tablet Take 40 mg by mouth at bedtime.     Historical Provider, MD  Tetrahydrozoline HCl (VISINE OP) Place 1 drop into both eyes daily as needed (itching).    Historical Provider, MD  vitamin C (ASCORBIC ACID) 500 MG tablet Take 500 mg by mouth daily after lunch.     Historical Provider, MD  vitamin E 400 UNIT capsule Take 400 Units by mouth daily after lunch.    Historical Provider, MD    ALLERGIES:  Allergies  Allergen Reactions  . Penicillins Other (See Comments)    Unknown allergic reaction  . 2,4-D Dimethylamine (Amisol) Other (See Comments)  . Ace Inhibitors Other (See Comments)  . Lanolin Hives  . Sulfonamide  Derivatives Swelling and Rash    SOCIAL HISTORY:  Social History  Substance Use Topics  . Smoking status: Never Smoker  . Smokeless tobacco: Never Used  . Alcohol use No    FAMILY HISTORY: Family History  Problem Relation Age of Onset  . Heart attack Father 62  . Heart attack Sister   . Cancer Brother   . Cancer Brother   . Cirrhosis Brother   . Heart disease Brother   . Cancer Brother   . COPD Brother   . COPD Sister   . Cancer Sister   . Cancer Sister   . Hypertension Daughter   . Stroke Neg Hx     EXAM: BP 136/78   Pulse 71  Temp (!) 101.2 F (38.4 C) (Rectal)   Resp (!) 26   Ht 5\' 1"  (1.549 m)   Wt 150 lb (68 kg)   SpO2 96%   BMI 28.34 kg/m  CONSTITUTIONAL: Alert and oriented and responds appropriately to questions. Nontoxic appearing. Febrile. Elderly. HEAD: Normocephalic EYES: Conjunctivae clear, pupils appear equal, EOMI ENT: normal nose; moist mucous membranes NECK: Supple, no meningismus, no nuchal rigidity, no LAD  CARD: Irregularly irregular; S1 and S2 appreciated; no murmurs, no clicks, no rubs, no gallops RESP: Normal chest excursion without splinting, patient is tachypneic; breath sounds clear and equal bilaterally; no wheezes, no rhonchi, no rales, no hypoxia or respiratory distress, speaking full sentences ABD/GI: Normal bowel sounds; non-distended; soft, minimally tender in the suprapubic area, no rebound, no guarding, no peritoneal signs, no hepatosplenomegaly BACK:  The back appears normal and is non-tender to palpation, there is no CVA tenderness EXT: Normal ROM in all joints; non-tender to palpation; no edema; normal capillary refill; no cyanosis, no calf tenderness or swelling    SKIN: Normal color for age and race; warm; no rash NEURO: Moves all extremities equally PSYCH: The patient's mood and manner are appropriate. Grooming and personal hygiene are appropriate.  MEDICAL DECISION MAKING: Patient here as a code sepsis. She is febrile,  tachypneic and has a lactate of 2.41. Recently diagnosed with urinary tract infection. Urine culture grew less than 10,000 colonies of bacteria. We'll repeat urine and urine culture here today as well his blood cultures, labs, chest x-ray and flu swab. We'll give IV fluids and broad-spectrum antibiotics given she meets SIRs criteria. Last echocardiogram in our system shows an EF of 55-60%.  ED PROGRESS: 6:25 AM  Patient's labs show leukocytosis with left shift. She also has elevation of her liver function tests but no right upper quadrant pain on exam. She reports she has had a cholecystectomy. This could be from sepsis. Denies history of alcohol use, recent Tylenol use, history of hepatitis or liver failure. Urine shows small leukocytes, too numerous to count white blood cells, 60-30 RBCs. Culture pending. Chest x-ray shows small pleural effusions and interstitial prominence suspicion for pulmonary edema. We'll add on BNP. She will still receive IV fluids will do so cautiously. Will discuss with medicine for admission.  7:00 AM Discussed patient's case with hospitalist, Dr. Tamala Julian.  I have recommended admission and patient (and family if present) agree with this plan. Admitting physician will place admission orders.   I reviewed all nursing notes, vitals, pertinent previous records, EKGs, lab and urine results, imaging (as available).     EKG Interpretation  Date/Time:  Tuesday July 03 2016 05:21:15 EDT Ventricular Rate:  63 PR Interval:    QRS Duration: 108 QT Interval:  528 QTC Calculation: 541 R Axis:   -11 Text Interpretation:  Atrial fibrillation Paired ventricular premature complexes Inferior infarct, old Probable anterior infarct, age indeterminate Prolonged QT interval Baseline wander in lead(s) V2 No significant change since last tracing Confirmed by WARD,  DO, KRISTEN ([id number]) on 07/03/2016 5:39:51 AM         CRITICAL CARE Performed by: Nyra Jabs   Total critical care  time: 45 minutes  Critical care time was exclusive of separately billable procedures and treating other patients.  Critical care was necessary to treat or prevent imminent or life-threatening deterioration.  Critical care was time spent personally by me on the following activities: development of treatment plan with patient and/or surrogate as well as nursing, discussions with consultants, evaluation  of patient's response to treatment, examination of patient, obtaining history from patient or surrogate, ordering and performing treatments and interventions, ordering and review of laboratory studies, ordering and review of radiographic studies, pulse oximetry and re-evaluation of patient's condition.     [place], DO 07/03/16 0700

## 2016-07-03 NOTE — Progress Notes (Addendum)
Signout from Dr. Leonides Schanz.  Laura Zuniga is a 83 year old female with pmh HTN, COPD, CKD, CAD, and  PAF; who presents with dysuria and intermittent fevers. Recently diagnosed with the UTI treated with Macrobid. VS stable. WBC 12.5, Cr 1.1, lactic acid 2.4 and elevated LFTs. SIRS criteria met patient started on broad-spectrum antibiotics of vancomycin, Levaquin, and aztreonam.  UA positive for signs of infection. Blood cultures obtained. Admitted to a telemetry bed. Patient is a full code. Patient is being ordered  2 L of normal saline IV fluids despite chest x-ray showing signs suspicious for CHF/fluid overload will need to watch carefully.

## 2016-07-03 NOTE — ED Triage Notes (Signed)
Pt comes from home via [facility], called out for fever 101.6, pt took tylenol, diagnosed with UTI on Sunday, pt had blood in pus in urine, pt has been on ABT or one day. Pt is tachypneic

## 2016-07-03 NOTE — Progress Notes (Signed)
Daughter requested nursing staff contact medical staff due to concerns over patient possibly breathing more shallow and had questions as to why she was transitioned from a higher level of care to nonmonitored status. After answering multiple questions the decision was made to resume cardiac monitoring so patient's respiratory status could be monitored more closely when staff were not directly at the bedside  Erin Hearing, ANP

## 2016-07-03 NOTE — ED Notes (Signed)
Patient transported to CT 

## 2016-07-03 NOTE — H&P (Signed)
History and Physical    Laura Zuniga GOT:[id number] DOB: 1917-07-10 DOA: 07/03/2016   PCP: Wenda Low, MD   Patient coming from/Resides with: Private residence /Daughter  Admission status: Inpatient/telemetry -medically necessary to stay a minimum 2 midnights to rule out impending and/or unexpected changes in physiologic status that may differ from initial evaluation performed in the ER and/or at time of admission. Presents with sepsis physiology and failed outpatient treatment for UTI with pyuria. Patient is dehydrated and has new transaminitis suspected related to [place]. Patient will require frequent monitoring of hemodynamic status, intake and output, daily labs, IV antibiotics, inpatient PT and OT evaluation.  Chief Complaint: Fever and worsened UTI symptoms  HPI: Laura Zuniga is a 83 y.o. female with medical history significant for interstitial lung disease and pulmonary hypertension secondary to dose related amiodarone toxicity, paroxysmal atrial fibrillation not on anticoagulation, hypertension, mild dysphagia, days 3 chronic kidney disease, dyslipidemia, chronic diastolic heart failure, hypothyroidism and CAD. Patient had been complaining of dysuria 1 week with symptoms waxing and waning. By this past Sunday symptoms were severe and patient was taken to a local urgent care where urinalysis revealed likely UTI and she was prescribed Macrobid. Unfortunately symptoms worsened. Overnight she became slightly more confused and fever spiked to 101.6. Daughter called EMS to the home. Patient was transported here where she met sepsis criteria. Of note urinalysis obtained on 3/25 demonstrated insignificant growth on the urine culture.  ED Course:  Vital Signs: BP (!) 146/45 (BP Location: Right Arm)   Pulse 60   Temp 98.3 F (36.8 C) (Oral)   Resp (!) 34   Ht '5\' 1"'  (1.549 m)   Wt 68 kg (150 lb)   SpO2 96%   BMI 28.34 kg/m  2 view CXR: Interstitial prominence suspicious for  pulmonary edema-I have reviewed chest x-ray with attending physician and this is likely secondary to patient's known ILD Lab data: Sodium 135, potassium 4.0, chloride 97, CO2 27, glucose 137, BUN 14, creatinine 1.12, alkaline phosphatase 131, AST 224, ALT 145, total bilirubin 0.8, albumin 3.2, total protein 6.5, BNP 402, lactic acid 2.41, white count 12,500 with neutrophils 92% and absolute neutrophils 11.5%, hemoglobin 12.2, platelets 205,000, coags are normal, poc troponin 0.03, influenza PCR negative, urinalysis abnormal with hazy appearance, rare bacteria, small leukocytes, nitrite negative, wbc's TNTC, urine culture pending, blood cultures previously obtained on 3/25 growth to date Medications and treatments: Normal saline bolus 3 L, Levaquin 750 mg IV 1, Azactam 2 g IV 1, vancomycin 1 g IV 1  Review of Systems:  In addition to the HPI above,  No Headache, changes with Vision or hearing, new weakness, tingling, numbness in any extremity, dizziness, dysarthria or word finding difficulty, gait disturbance or imbalance, tremors or seizure activity No problems swallowing food or Liquids, indigestion/reflux, choking or coughing while eating, abdominal pain with or after eating No Chest pain, Cough or Shortness of Breath, palpitations, orthopnea or DOE No N/V, melena,hematochezia, dark tarry stools, constipation No flank pain No new skin rashes, lesions, masses or bruises, No new joint pains, aches, swelling or redness No recent unintentional weight gain or loss No polyuria, polydypsia or polyphagia   Past Medical History:  Diagnosis Date  . Bradycardia    a. Amiodarone DC'd 12/16 >> b. Holter 1/17 - NSR, sinus brady, junctional rhythm, pause 2.4 s  . C. difficile colitis 2015  . CAD (coronary artery disease)    never had cardiac cath, CAD dx by positive ant. wall defect on Nuc.  study 2007 >>  no cath due to high risk/advanced age  . Chronic diastolic CHF (congestive heart failure) ([place])      a. Echo 6/15 - Mild LVH, EF 55-60%, mild AI, mild MR, moderate LAE, mild RAE, PASP 52 mm Hg  . CKD (chronic kidney disease)   . COPD (chronic obstructive pulmonary disease) ([place])   . Diverticulitis   . GERD (gastroesophageal reflux disease)   . Hypertension   . Macular degeneration   . PAF (paroxysmal atrial fibrillation) ([facility])    not optimal candidate for anticoagulation    Past Surgical History:  Procedure Laterality Date  . APPENDECTOMY    . CHOLECYSTECTOMY    . EP IMPLANTABLE DEVICE N/A 05/17/2015   Procedure: Pacemaker Implant;  Surgeon: Evans Lance, MD;  Location: [facility];  Service: Cardiovascular;  Laterality: N/A;  . ESOPHAGOGASTRODUODENOSCOPY N/A 09/22/2013   Procedure: ESOPHAGOGASTRODUODENOSCOPY (EGD);  Surgeon: Lear Ng, MD;  Location: [facility] ENDOSCOPY;  Service: Endoscopy;  Laterality: N/A;  . ESOPHAGOGASTRODUODENOSCOPY N/A 09/22/2013   Procedure: ESOPHAGOGASTRODUODENOSCOPY (EGD);  Surgeon: Lear Ng, MD;  Location[facility] ENDOSCOPY;  Service: Endoscopy;  Laterality: N/A;  . EYE SURGERY    . FLEXIBLE SIGMOIDOSCOPY N/A 09/22/2013   Procedure: FLEXIBLE SIGMOIDOSCOPY;  Surgeon: Lear Ng, MD;  Location: [facility] ENDOSCOPY;  Service: Endoscopy;  Laterality: N/A;  . FLEXIBLE SIGMOIDOSCOPY N/A 09/22/2013   Procedure: FLEXIBLE SIGMOIDOSCOPY;  Surgeon: Lear Ng, MD;  Location: [facility] ENDOSCOPY;  Service: Endoscopy;  Laterality: N/A;  unprepped/ egd first  . VAGINAL HYSTERECTOMY      Social History   Social History  . Marital status: Widowed    Spouse name: N/A  . Number of children: N/A  . Years of education: N/A   Occupational History  . Not on file.   Social History Main Topics  . Smoking status: Never Smoker  . Smokeless tobacco: Never Used  . Alcohol use No  . Drug use: No  . Sexual activity: No   Other Topics Concern  . Not on file   Social History Narrative        father died age 71: [place] MI         mother died age  50: gallbladder surgery complications         4 brothers: all deceased; prostate ca, throat ca, emphesema, MI, cirrhosis from ETOH         3 sisters: one living: MI, emphesema, 2 sisters with breast CA    Mobility: RW Work history: Not obtained   Allergies  Allergen Reactions  . Penicillins Other (See Comments)    Unknown allergic reaction  . 2,4-D Dimethylamine (Amisol) Other (See Comments)  . Ace Inhibitors Other (See Comments)  . Lanolin Hives  . Sulfonamide Derivatives Swelling and Rash    Family History  Problem Relation Age of Onset  . Heart attack Father 47  . Heart attack Sister   . Cancer Brother   . Cancer Brother   . Cirrhosis Brother   . Heart disease Brother   . Cancer Brother   . COPD Brother   . COPD Sister   . Cancer Sister   . Cancer Sister   . Hypertension Daughter   . Stroke Neg Hx      Prior to Admission medications   Medication Sig Start Date End Date Taking? Authorizing Provider  albuterol (PROVENTIL HFA;VENTOLIN HFA) 108 (90 BASE) MCG/ACT inhaler Inhale 2 puffs into the lungs every 6 (six) hours as needed  for wheezing or shortness of breath. 02/23/15  Yes Deloria Lair, NP  amiodarone (PACERONE) 100 MG tablet Take 1 tablet (100 mg total) by mouth daily. 01/10/16  Yes Burtis Junes, NP  Biotin 1 MG CAPS Take 1 mg by mouth daily.   Yes Historical Provider, MD  clopidogrel (PLAVIX) 75 MG tablet TAKE 75 MG BY MOUTH ONCE DAILY 07/01/16  Yes Robyn Haber, MD  Cranberry 250 MG CAPS Take 250 mg by mouth daily after lunch.    Yes Historical Provider, MD  escitalopram (LEXAPRO) 10 MG tablet Take 10 mg by mouth daily.    Yes Historical Provider, MD  ferrous sulfate 325 (65 FE) MG tablet Take 325 mg by mouth daily after lunch.    Yes Historical Provider, MD  furosemide (LASIX) 40 MG tablet TAKE TWO TABLETS BY MOUTH IN THE MORNING, THEN TAKE ONE IN THE EVENING, YOU CAN TAKE AN EXTRA 40 MG IN THE EVENING IF NEEDED Patient taking differently: TAKE 80 MG BY  MOUTH IN THE MORNING, THEN TAKE 40 MG IN THE AFTERNOON, YOU CAN TAKE AN EXTRA 40 MG IN THE EVENING IF NEEDED FOR SWELLING 05/13/14  Yes Jettie Booze, MD  hydrALAZINE (APRESOLINE) 25 MG tablet Take 1 tablet (25 mg total) by mouth 2 (two) times daily. 10/17/15  Yes Jettie Booze, MD  isosorbide mononitrate (IMDUR) 60 MG 24 hr tablet TAKE ONE TABLET BY MOUTH ONCE DAILY Patient taking differently: TAKE 60 MG BY MOUTH ONCE DAILY 04/10/16  Yes Jettie Booze, MD  levothyroxine (SYNTHROID, LEVOTHROID) 50 MCG tablet Take 50 mcg by mouth daily before breakfast.  06/04/14  Yes Historical Provider, MD  Multiple Vitamins-Minerals (ICAPS AREDS 2 PO) Take 1 tablet by mouth daily.   Yes Historical Provider, MD  nitrofurantoin, macrocrystal-monohydrate, (MACROBID) 100 MG capsule Take 1 capsule (100 mg total) by mouth 2 (two) times daily. 07/01/16  Yes Robyn Haber, MD  pantoprazole (PROTONIX) 40 MG tablet Take 40 mg by mouth daily after lunch.    Yes Historical Provider, MD  potassium chloride SA (K-DUR,KLOR-CON) 20 MEQ tablet Take 20 mEq by mouth 2 (two) times daily.  05/20/14  Yes Historical Provider, MD  pravastatin (PRAVACHOL) 40 MG tablet Take 40 mg by mouth at bedtime.    Yes Historical Provider, MD  vitamin C (ASCORBIC ACID) 500 MG tablet Take 500 mg by mouth daily after lunch.    Yes Historical Provider, MD  vitamin E 400 UNIT capsule Take 400 Units by mouth daily after lunch.   Yes Historical Provider, MD  acetaminophen (TYLENOL) 500 MG tablet Take 2 tablets (1,000 mg total) by mouth every 6 (six) hours as needed for mild pain or moderate pain. Patient not taking: Reported on 07/03/2016 05/27/16   Duffy Bruce, MD  docusate sodium (COLACE) 100 MG capsule Take 100 mg by mouth daily as needed for mild constipation.    Historical Provider, MD  ondansetron (ZOFRAN) 8 MG tablet Take 1 tablet (8 mg total) by mouth every 8 (eight) hours as needed for nausea or vomiting. Patient not taking: Reported on  07/03/2016 06/08/16   Daleen Bo, MD    Physical Exam: Vitals:   07/03/16 0645 07/03/16 0700 07/03/16 0715 07/03/16 0748  BP: (!) 136/30 (!) 131/48 (!) 137/52 (!) 146/45  Pulse: 63 60 (!) 59 60  Resp:  (!) 28 (!) 24 (!) 34  Temp:  98.3 F (36.8 C)    TempSrc:  Oral    SpO2: 95% 95% 96% 96%  Weight:  Height:          Constitutional: NAD, calm, comfortable Eyes: PERRL, lids and conjunctivae normal ENMT: Mucous membranes are dry. Posterior pharynx clear of any exudate or lesions. Poor dentition.  Neck: normal, supple, no masses, no thyromegaly Respiratory: Essentially clear to auscultation with fine expiratory crackles mid fields down. Normal respiratory effort. No accessory muscle use. RA Cardiovascular: Regular rate and rhythm, no murmurs / rubs / gallops. No extremity edema. 2+ pedal pulses. No carotid bruits.  Abdomen: Slightly distended with mild focal tenderness over suprapubic area, no masses palpated. No hepatosplenomegaly. Bowel sounds positive.  Musculoskeletal: no clubbing / cyanosis. No joint deformity upper and lower extremities. Good ROM, no contractures. Normal muscle tone.  Skin: no rashes, lesions, ulcers. No induration Neurologic: CN 2-12 grossly intact. Sensation intact, DTR normal. Strength 5/5 x all 4 extremities.  Psychiatric:  Alert and oriented x 3. Normal mood.    Labs on Admission: I have personally reviewed following labs and imaging studies  CBC:  Recent Labs Lab 07/03/16 0520  WBC 12.5*  NEUTROABS 11.5*  HGB 12.2  HCT 37.1  MCV 98.9  PLT 301   Basic Metabolic Panel:  Recent Labs Lab 07/03/16 0520  NA 135  K 4.0  CL 97*  CO2 27  GLUCOSE 137*  BUN 14  CREATININE 1.12*  CALCIUM 8.9   GFR: Estimated Creatinine Clearance: 25.9 mL/min (A) (by C-G formula based on SCr of 1.12 mg/dL (H)). Liver Function Tests:  Recent Labs Lab 07/03/16 0520  AST 224*  ALT 145*  ALKPHOS 131*  BILITOT 0.8  PROT 6.5  ALBUMIN 3.2*   No  results for input(s): LIPASE, AMYLASE in the last 168 hours. No results for input(s): AMMONIA in the last 168 hours. Coagulation Profile:  Recent Labs Lab 07/03/16 0520  INR 1.02   Cardiac Enzymes: No results for input(s): CKTOTAL, CKMB, CKMBINDEX, TROPONINI in the last 168 hours. BNP (last 3 results) No results for input(s): PROBNP in the last 8760 hours. HbA1C: No results for input(s): HGBA1C in the last 72 hours. CBG: No results for input(s): GLUCAP in the last 168 hours. Lipid Profile: No results for input(s): CHOL, HDL, LDLCALC, TRIG, CHOLHDL, LDLDIRECT in the last 72 hours. Thyroid Function Tests: No results for input(s): TSH, T4TOTAL, FREET4, T3FREE, THYROIDAB in the last 72 hours. Anemia Panel: No results for input(s): VITAMINB12, FOLATE, FERRITIN, TIBC, IRON, RETICCTPCT in the last 72 hours. Urine analysis:    Component Value Date/Time   COLORURINE YELLOW 07/03/2016 0600   APPEARANCEUR HAZY (A) 07/03/2016 0600   LABSPEC 1.011 07/03/2016 0600   PHURINE 5.0 07/03/2016 0600   GLUCOSEU NEGATIVE 07/03/2016 0600   HGBUR NEGATIVE 07/03/2016 0600   BILIRUBINUR NEGATIVE 07/03/2016 0600   KETONESUR NEGATIVE 07/03/2016 0600   PROTEINUR NEGATIVE 07/03/2016 0600   UROBILINOGEN 0.2 07/01/2016 1556   NITRITE NEGATIVE 07/03/2016 0600   LEUKOCYTESUR SMALL (A) 07/03/2016 0600   Sepsis Labs: '@LABRCNTIP' (procalcitonin:4,lacticidven:4) ) Recent Results (from the past 240 hour(s))  Urine culture     Status: Abnormal   Collection Time: 07/01/16  4:13 PM  Result Value Ref Range Status   Specimen Description URINE, CLEAN CATCH  Final   Special Requests NONE  Final   Culture <10,000 COLONIES/mL INSIGNIFICANT GROWTH (A)  Final   Report Status 07/02/2016 FINAL  Final     Radiological Exams on Admission: Dg Chest 2 View  Result Date: 07/03/2016 CLINICAL DATA:  Sepsis.  Shortness of breath tonight. EXAM: CHEST  2 VIEW COMPARISON:  Radiograph 05/27/2016, chest CT 01/19/2016 FINDINGS:  Dual lead left-sided pacemaker remains in place. Unchanged heart size and mediastinal contours allowing for differences in technique. There is atherosclerosis of the aortic arch. Small bilateral pleural effusions with associated bibasilar atelectasis. Increased interstitial prominence suspicious for mild pulmonary edema. No pneumothorax. The bones are under mineralized. IMPRESSION: Small pleural effusions. Interstitial prominence suspicious for pulmonary edema. Suspect fluid overload/ CHF. Thoracic aortic atherosclerosis. Electronically Signed   By: Jeb Levering M.D.   On: 07/03/2016 05:54    EKG: (Independently reviewed) Atrial fibrillation with ventricular rate 63 bpm, unifocal PVC, QTC 451 ms (chronic QT prolongation), no acute ischemic changes and unchanged from previous EKG  Assessment/Plan Principal Problem:   Sepsis  -Patient presents with fever and persistent UTI symptoms despite pharmacotherapy in the outpatient setting -Sepsis physiology: Temp > 100.9, RR >20, WBC > 12,000 w/ > 10% bands, elevated lactate, transaminitis (although I suspect this is related to Macrobid and not from sepsis physiology) -Continue to cycle lactate -Aggressive volume resuscitation 3 L fluid ordered in ER but by our evaluation she only received about 700 mL so our recommendation was to give 1 L only then gentle IV fluid hydration at 50/hr 24 hrs -Procalcitonin and ESR -Hemodynamically stable -Follow up on blood and urine cxs -Suspect urinary tract source although could have viral etiology  -Influenza PCR negative-ck respiratory viral panel  Active Problems:   ILD (interstitial lung disease)/ Pulmonary HTN -Followed by Dr. Byrum/pulmonology in the OP setting -Etiology secondary to initial higher dosing of amiodarone-stable on low-dose amiodarone -Current x-ray changes consistent with ILD as opposed to acute CHF -Continue supportive care (nebs, prn O2) -Previously diagnosed with COPD but normal PFTs so  COPD r/o'd -Also has a component of chronic aspiration contributing to ILD -Echo 2015: PA pressure 52 mmHg    Abnormal urinalysis -Initially txd with Macrobid beginning 3/25 -Symptoms have worsened -Urine Cx on 3/25 insignificant growth -Blood cultures on 3/25 NGTD -Follow-up on repeat urine cx -History of urinary retention; bladder scan today 300 mL -Check CT abd/pelvis -Narrow to Rocephin IV -Pyridium for dysuria -H/o C. difficile colitis so begin Lactobacillus      Essential hypertension -Current BP controlled -Continue preadmission hydralazine    PAF (paroxysmal atrial fibrillation) /permanent pacemaker -Not on anticoagulation secondary to advanced age and frequent falls -Current rate controlled -CHADVASc=6    Chronic diastolic heart failure  -Compensated at this juncture -On very high-dose Lasix or to admission: 80 mg a.m., 40 mg p.m. and can take prn 40 mg xtra or swelling -Appears dehydrated in setting of infection and fever therefore holding Lasix for now    CKD (chronic kidney disease), stage III -Renal function stable and at baseline    Dysphagia (mild-tolerates regular/thin liquids w/ swallow techniques) -No worsening of issues related to intermittent coughing and/or choking with eating -Stable with prescribed diet and utilization of swallowing techniques    Transaminitis -New finding since the addition of Macrobid -All LFTs were normal on 3/1 -Today AP 131, AST 224, ALT 145 with normal total bilirubin -DC Macrobid -Hold amiodarone and Pravachol -CMET in a.m.    HLD (hyperlipidemia) -Holding Pravachol as above    CAD (coronary artery disease) -Asymptomatic -Continue Plavix -Not on BB prior to admit    Hypothyroidism -Continue Synthroid -TSH 4.217 (2016) -Check TSH      DVT prophylaxis: Lovenox  Code Status: Full Family Communication: Daughter Disposition Plan: Home Consults called: None     ELLIS,ALLISON L. ANP-BC [facility]  Pager [phone]   If 7PM-7AM, please contact night-coverage [email or web address] Password [facility]  07/03/2016, 7:58 AM

## 2016-07-04 ENCOUNTER — Encounter (HOSPITAL_COMMUNITY): Payer: Self-pay | Admitting: General Practice

## 2016-07-04 ENCOUNTER — Inpatient Hospital Stay (HOSPITAL_COMMUNITY): Payer: PPO

## 2016-07-04 DIAGNOSIS — Z950 Presence of cardiac pacemaker: Secondary | ICD-10-CM

## 2016-07-04 DIAGNOSIS — I5032 Chronic diastolic (congestive) heart failure: Secondary | ICD-10-CM

## 2016-07-04 DIAGNOSIS — R8290 Unspecified abnormal findings in urine: Secondary | ICD-10-CM

## 2016-07-04 LAB — CBC
HEMATOCRIT: 33.1 % — AB (ref 36.0–46.0)
Hemoglobin: 10.9 g/dL — ABNORMAL LOW (ref 12.0–15.0)
MCH: 32.4 pg (ref 26.0–34.0)
MCHC: 32.9 g/dL (ref 30.0–36.0)
MCV: 98.5 fL (ref 78.0–100.0)
PLATELETS: 183 10*3/uL (ref 150–400)
RBC: 3.36 MIL/uL — ABNORMAL LOW (ref 3.87–5.11)
RDW: 13 % (ref 11.5–15.5)
WBC: 7 10*3/uL (ref 4.0–10.5)

## 2016-07-04 LAB — MRSA PCR SCREENING: MRSA BY PCR: NEGATIVE

## 2016-07-04 LAB — COMPREHENSIVE METABOLIC PANEL
ALBUMIN: 2.5 g/dL — AB (ref 3.5–5.0)
ALK PHOS: 114 U/L (ref 38–126)
ALT: 130 U/L — ABNORMAL HIGH (ref 14–54)
AST: 113 U/L — AB (ref 15–41)
Anion gap: 9 (ref 5–15)
BILIRUBIN TOTAL: 0.7 mg/dL (ref 0.3–1.2)
BUN: 10 mg/dL (ref 6–20)
CALCIUM: 8.8 mg/dL — AB (ref 8.9–10.3)
CO2: 26 mmol/L (ref 22–32)
Chloride: 101 mmol/L (ref 101–111)
Creatinine, Ser: 0.99 mg/dL (ref 0.44–1.00)
GFR calc Af Amer: 54 mL/min — ABNORMAL LOW (ref >60)
GFR, EST NON AFRICAN AMERICAN: 47 mL/min — AB (ref >60)
GLUCOSE: 95 mg/dL (ref 65–99)
POTASSIUM: 3.5 mmol/L (ref 3.5–5.1)
Sodium: 136 mmol/L (ref 135–145)
TOTAL PROTEIN: 5.5 g/dL — AB (ref 6.5–8.1)

## 2016-07-04 MED ORDER — FUROSEMIDE 10 MG/ML IJ SOLN
40.0000 mg | Freq: Two times a day (BID) | INTRAMUSCULAR | Status: DC
  Administered 2016-07-04 – 2016-07-06 (×5): 40 mg via INTRAVENOUS
  Filled 2016-07-04 (×5): qty 4

## 2016-07-04 NOTE — Evaluation (Signed)
Physical Therapy Evaluation Patient Details Name: Laura Zuniga MRN: [id number] DOB: 1917-06-22 Today's Date: 07/04/2016   History of Present Illness   Laura Zuniga is a 83 y.o. female with medical history significant for interstitial lung disease and pulmonary hypertension secondary to dose related amiodarone toxicity, paroxysmal atrial fibrillation not on anticoagulation, hypertension, mild dysphagia, days 3 chronic kidney disease, dyslipidemia, chronic diastolic heart failure, hypothyroidism and CAD. Patient had been complaining of dysuria 1 week with symptoms waxing and waning. Positive for UTI and sepsis.   Clinical Impression  Pt admitted with above diagnosis. Pt currently with functional limitations due to the deficits listed below (see PT Problem List). Pt was able to ambulate with RW with min guard assist but did desat on RA.  Could improve breathing with pursed lip breathing.   Daughter cares for pt at home. Pt will benefit from skilled PT to increase their independence and safety with mobility to allow discharge to the venue listed below.      Follow Up Recommendations Home health PT;Supervision/Assistance - 24 hour    Equipment Recommendations  None recommended by PT    Recommendations for Other Services       Precautions / Restrictions Precautions Precautions: Fall Restrictions Weight Bearing Restrictions: No      Mobility  Bed Mobility Overal bed mobility: Needs Assistance Bed Mobility: Supine to Sit     Supine to sit: Min assist;HOB elevated     General bed mobility comments: Used rail and needed a little assist for elevation of trunk.    Transfers Overall transfer level: Needs assistance Equipment used: Rolling walker (2 wheeled) Transfers: Sit to/from Stand Sit to Stand: Min guard         General transfer comment: Needed cues for hand placement.   Ambulation/Gait Ambulation/Gait assistance: Min guard Ambulation Distance (Feet): 120  Feet Assistive device: Rolling walker (2 wheeled) Gait Pattern/deviations: Step-through pattern;Decreased stride length   Gait velocity interpretation: Below normal speed for age/gender General Gait Details: Pt generally steady in controlled environment.  Pt did need cues for pursed lip breathing as she was wheezing at times.  No LOB but no challenges given.    Stairs            Wheelchair Mobility    Modified Rankin (Stroke Patients Only)       Balance Overall balance assessment: Needs assistance Sitting-balance support: No upper extremity supported;Feet supported Sitting balance-Leahy Scale: Fair     Standing balance support: Bilateral upper extremity supported;During functional activity Standing balance-Leahy Scale: Poor Standing balance comment: relies on UE support.                              Pertinent Vitals/Pain Pain Assessment: No/denies pain  O2 sat on 2.5LO2 98%.  Desat to 86% on RA with activity.  Did improve to 90-92% with pursed lip breathing.  Replaced 2.5LO2 once back to room.    Home Living Family/patient expects to be discharged to:: Private residence Living Arrangements: Children Available Help at Discharge: Family;Available 24 hours/day (daughter lives with pt) Type of Home: House Home Access: Stairs to enter Entrance Stairs-Rails: Right;Left;Can reach both Entrance Stairs-Number of Steps: 3 Home Layout: One level Home Equipment: Toilet riser;Shower seat;Walker - 2 wheels;Walker - 4 wheels;Wheelchair - manual      Prior Function Level of Independence: Independent with assistive device(s);Needs assistance   Gait / Transfers Assistance Needed: Used RW indoors most of time, rollator outdoors,  wheelchair community.  Min assist overall.  ADL's / Homemaking Assistance Needed: Daughter assisted pt into shower and washed her back.  Daughter assisted with dressing prn.         Hand Dominance        Extremity/Trunk Assessment    Upper Extremity Assessment Upper Extremity Assessment: Defer to OT evaluation    Lower Extremity Assessment Lower Extremity Assessment: Generalized weakness    Cervical / Trunk Assessment Cervical / Trunk Assessment: Normal  Communication   Communication: No difficulties  Cognition Arousal/Alertness: Awake/alert Behavior During Therapy: WFL for tasks assessed/performed Overall Cognitive Status: Within Functional Limits for tasks assessed                                        General Comments      Exercises     Assessment/Plan    PT Assessment Patient needs continued PT services  PT Problem List Decreased activity tolerance;Decreased balance;Decreased mobility;Decreased knowledge of use of DME;Decreased safety awareness;Decreased knowledge of precautions       PT Treatment Interventions DME instruction;Gait training;Functional mobility training;Therapeutic activities;Therapeutic exercise;Balance training;Stair training;Patient/family education    PT Goals (Current goals can be found in the Care Plan section)  Acute Rehab PT Goals Patient Stated Goal: to go home PT Goal Formulation: With patient Time For Goal Achievement: 07/18/16 Potential to Achieve Goals: Good    Frequency Min 3X/week   Barriers to discharge        Co-evaluation               End of Session Equipment Utilized During Treatment: Gait belt;Oxygen Activity Tolerance: Patient limited by fatigue Patient left: in chair;with call bell/phone within reach;with family/visitor present Nurse Communication: Mobility status PT Visit Diagnosis: Muscle weakness (generalized) (M62.81)    Time: 7341-9379 PT Time Calculation (min) (ACUTE ONLY): 28 min   Charges:   PT Evaluation $PT Eval Moderate Complexity: 1 Procedure PT Treatments $Gait Training: 8-22 mins   PT G Codes:        Orchid Glassberg,PT Acute Rehabilitation [phone] (pager)   Denice Paradise 07/04/2016,  10:21 AM

## 2016-07-04 NOTE — Evaluation (Signed)
Occupational Therapy Evaluation Patient Details Name: Laura Zuniga MRN: [id number] DOB: [birth year] Today's Date: 07/04/2016    History of Present Illness  Laura Zuniga is a 83 y.o. female with medical history significant for interstitial lung disease and pulmonary hypertension secondary to dose related amiodarone toxicity, paroxysmal atrial fibrillation not on anticoagulation, hypertension, mild dysphagia, days 3 chronic kidney disease, dyslipidemia, chronic diastolic heart failure, hypothyroidism and CAD. Patient had been complaining of dysuria 1 week with symptoms waxing and waning. Positive for UTI and sepsis.    Clinical Impression   PTA, pt lived with daughter who assisted with bathing and dressing after a recent fall two months ago. Pt performed grooming and functional mobility around home with RW and supervision. Currently, daughter continues to provide A with dressing/bathing. Pt required Min guard A for grooming and functional mobility with RW. Pt would benefit from continued acute OT to facilitate safe dc home and increase independence. Recommend dc home with 24 hour supervision once medically stable per physician.     Follow Up Recommendations  No OT follow up;Supervision/Assistance - 24 hour    Equipment Recommendations  None recommended by OT    Recommendations for Other Services       Precautions / Restrictions Precautions Precautions: Fall Restrictions Weight Bearing Restrictions: No      Mobility Bed Mobility Overal bed mobility: Needs Assistance Bed Mobility: Supine to Sit     Supine to sit: HOB elevated;Supervision     General bed mobility comments: Used rail and HOB elevated  Transfers Overall transfer level: Needs assistance Equipment used: Rolling walker (2 wheeled) Transfers: Sit to/from Stand Sit to Stand: Min guard         General transfer comment: Needed cues for hand placement.     Balance Overall balance assessment: Needs  assistance Sitting-balance support: No upper extremity supported;Feet supported Sitting balance-Leahy Scale: Fair     Standing balance support: Bilateral upper extremity supported;During functional activity Standing balance-Leahy Scale: Fair Standing balance comment: Pt able to stand at sink and weight shift while performing grooming. benefits from [place]o guard for safety                           ADL either performed or assessed with clinical judgement   ADL Overall ADL's : Needs assistance/impaired Eating/Feeding: Set up;Sitting   Grooming: Oral care;Min guard;Standing Grooming Details (indicate cue type and reason): Some A for FM due to decreased grasp and pinch strength Upper Body Bathing: Moderate assistance;Sitting   Lower Body Bathing: Moderate assistance;Sit to/from stand Lower Body Bathing Details (indicate cue type and reason): Daughter A with bathing at baseline Upper Body Dressing : Minimal assistance;Sitting   Lower Body Dressing: Moderate assistance;Sit to/from stand Lower Body Dressing Details (indicate cue type and reason): Daughter A with dressing at baseline Toilet Transfer: Min guard;Ambulation;RW (Simulate to recliner)           Functional mobility during ADLs: Min guard;Rolling walker General ADL Comments: Pt performs ADLs and functional mobility with Min guard A. For dressing and bathing, daughter provided assistance - same as baseline     Vision Baseline Vision/History: Macular Degeneration Patient Visual Report: No change from baseline       Perception     Praxis      Pertinent Vitals/Pain Pain Assessment: No/denies pain     Hand Dominance Right   Extremity/Trunk Assessment Upper Extremity Assessment Upper Extremity Assessment: Generalized weakness   Lower Extremity Assessment Lower  Extremity Assessment: Generalized weakness   Cervical / Trunk Assessment Cervical / Trunk Assessment: Normal   Communication  Communication Communication: No difficulties   Cognition Arousal/Alertness: Awake/alert Behavior During Therapy: WFL for tasks assessed/performed Overall Cognitive Status: Within Functional Limits for tasks assessed                                     General Comments  Pt VSS throughout session. Educated pt and daughter on low vision strategies to increase inpendence at home. Educated pt on purse lip breathing; pt demonstrated understanding and needed VCs throughout session to perform.     Exercises     Shoulder Instructions      Home Living Family/patient expects to be discharged to:: Private residence Living Arrangements: Children Available Help at Discharge: Family;Available 24 hours/day (daughter lives with pt) Type of Home: House Home Access: Stairs to enter [employer] of Steps: 3 Entrance Stairs-Rails: Right;Left;Can reach both Home Layout: One level     Bathroom Shower/Tub: Teacher, early years/pre: Standard Bathroom Accessibility: Yes   Home Equipment: Toilet riser;Shower seat;Walker - 2 wheels;Walker - 4 wheels;Wheelchair - manual          Prior Functioning/Environment Level of Independence: Independent with assistive device(s);Needs assistance  Gait / Transfers Assistance Needed: Used RW indoors most of time, rollator outdoors, wheelchair community.  Min assist overall. ADL's / Homemaking Assistance Needed: Daughter assisted pt into shower and washed her back.  Daughter assisted with dressing prn.    Comments: Daughter reports fall two months ago. Before the fall pt was performing dressing and bathing I. After the fall, daughter A with dressing and bathing for safety and fall prevention        OT Problem List: Decreased range of motion;Impaired balance (sitting and/or standing);Decreased knowledge of use of DME or AE;Impaired UE functional use      OT Treatment/Interventions: Self-care/ADL training;Therapeutic  exercise;Energy conservation;DME and/or AE instruction;Patient/family education;Therapeutic activities    OT Goals(Current goals can be found in the care plan section) Acute Rehab OT Goals Patient Stated Goal: Go home OT Goal Formulation: With patient Time For Goal Achievement: 07/18/16 Potential to Achieve Goals: Good ADL Goals Pt Will Perform Grooming: with supervision;standing Pt Will Perform Upper Body Dressing: with set-up;with supervision;sitting Pt Will Transfer to Toilet: with supervision;regular height toilet;grab bars;ambulating Additional ADL Goal #1: Pt and family will independently verbalize three fall prevention strategies  OT Frequency: Min 1X/week   Barriers to D/C:            Co-evaluation              End of Session Equipment Utilized During Treatment: Surveyor, mining Communication: Mobility status  Activity Tolerance: Patient tolerated treatment well Patient left: in chair;with call bell/phone within reach;with family/visitor present  OT Visit Diagnosis: Unsteadiness on feet (R26.81);Muscle weakness (generalized) (M62.81)                Time: 8127-5170 OT Time Calculation (min): 16 min Charges:  OT General Charges $OT Visit: 1 Procedure OT Evaluation $OT Eval Low Complexity: 1 Procedure G-Codes:     Libni Fusaro, OTR/L [phone]  [place] 07/04/2016, 2:03 PM

## 2016-07-04 NOTE — Progress Notes (Signed)
PROGRESS NOTE    Laura Zuniga  AUQ:[id number] DOB: June 05, 1917 DOA: 07/03/2016 PCP: Wenda Low, MD    Brief Narrative:  83 y.o. female with medical history significant for interstitial lung disease and pulmonary hypertension secondary to dose related amiodarone toxicity, paroxysmal atrial fibrillation not on anticoagulation, hypertension, mild dysphagia, days 3 chronic kidney disease, dyslipidemia, chronic diastolic heart failure, hypothyroidism and CAD. Patient had been complaining of dysuria 1 week with symptoms waxing and waning. By this past Sunday symptoms were severe and patient was taken to a local urgent care where urinalysis revealed likely UTI and she was prescribed Macrobid. Unfortunately symptoms worsened. Overnight she became slightly more confused and fever spiked to 101.6. Daughter called EMS to the home. Patient was transported here where she met sepsis criteria. Of note urinalysis obtained on 3/25 demonstrated insignificant growth on the urine culture  Assessment & Plan:   Principal Problem:   Sepsis ([place]) Active Problems:   HLD (hyperlipidemia)   Essential hypertension   CAD (coronary artery disease)   PAF (paroxysmal atrial fibrillation) (HCC)   Chronic diastolic heart failure (HCC)   CKD (chronic kidney disease), stage III   Cardiac pacemaker   ILD (interstitial lung disease) (HCC)   Abnormal urinalysis   Dysphagia (mild-tolerates regular/thin liquids w/ swallow techniques)   Pulmonary HTN   Transaminitis   Hypothyroidism  Principal Problem:   Sepsis  -Patient presents with fever and persistent UTI symptoms despite pharmacotherapy in the outpatient setting -Presenting sepsis physiology with Temp > 100.9, RR >20, WBC > 12,000 w/ > 10% bands, elevated lactate, transaminitis  -Lactate normalized -Patient was given aggressive volume resuscitation  -Blood and urine cultures are pending -Suspect urinary tract source although could have viral etiology    -Influenza PCR was negative - respiratory viral panel reviewed: negative   Active Problems:   ILD (interstitial lung disease)/ Pulmonary HTN -Followed by Dr. Byrum/pulmonology in the OP setting -Etiology secondary to initial higher dosing of amiodarone-stable on low-dose amiodarone -Continue supportive care (nebs, prn O2) -Previously diagnosed with COPD but normal PFTs so COPD r/o'd -Patient has hx of chronic aspiration noted -Echo 2015: PA pressure 52 mmHg -Have repeated CXR and reviewed. Findings of worsened pulm edema -Have resumed IV lasix bid    Abnormal urinalysis -Initially txd with Macrobid beginning 3/25 -Symptoms have worsened -Urine Cx on 3/25 insignificant growth -Blood cultures on 3/25 NGTD -Follow-up on repeat urine cx, pending -CT abd/pelvis without acute abnormality -Patient continued on Rocephin IV -Pyridium for dysuria -Started lactobacillus for history of C. difficile colitis       Essential hypertension -BP currently stable -Continued on preadmission hydralazine    PAF (paroxysmal atrial fibrillation) /permanent pacemaker -Not on anticoagulation secondary to advanced age and frequent falls -Current rate controlled -CHADVASc=6 -Stable at present    Acute on chronic diastolic heart failure  -On PO lasix at 80 mg a.m., 40 mg p.m. and can take prn 40 mg xtra or swelling -volume resuscitated overnight for presenting sepsis -Repeat CXR reviewed with findings of worsened CHF -Will start IV laisix BID    CKD (chronic kidney disease), stage III -Renal function remains stable and at baseline -Continue to monitor    Dysphagia (mild-tolerates regular/thin liquids w/ swallow techniques) -No worsening of issues related to intermittent coughing and/or choking with eating -Stable with prescribed diet and utilization of swallowing techniques -Stable at present    Transaminitis - LFTs noted to be normal on 3/1 -At presentation AP 131, AST 224, ALT 145  with normal  total bilirubin -Stopped Macrobid -Held amiodarone and Pravachol -LFT's have improved overnight -Will repeat CMP in AM    HLD (hyperlipidemia) -Holding Pravachol as per above    CAD (coronary artery disease) -Remains asymptomatic -Continue Plavix -Not on BB prior to admit    Hypothyroidism -Continue Synthroid -TSH 4.217 (2016) -TSH within normal limits   DVT prophylaxis: Lovenox subQ Code Status: Full Family Communication: Pt in room, family at bedside Disposition Plan: Uncertain at this time  Consultants:     Procedures:     Antimicrobials: Anti-infectives    Start     Dose/Rate Route Frequency Ordered Stop   07/04/16 0600  cefTRIAXone (ROCEPHIN) 1 g in dextrose 5 % 50 mL IVPB     1 g 100 mL/hr over 30 Minutes Intravenous Every 24 hours 07/03/16 0823     07/03/16 0600  levofloxacin (LEVAQUIN) IVPB 750 mg     750 mg 100 mL/hr over 90 Minutes Intravenous  Once 07/03/16 0547 07/03/16 0750   07/03/16 0600  aztreonam (AZACTAM) 2 g in dextrose 5 % 50 mL IVPB     2 g 100 mL/hr over 30 Minutes Intravenous  Once 07/03/16 0547 07/03/16 0745   07/03/16 0600  vancomycin (VANCOCIN) IVPB 1000 mg/200 mL premix     1,000 mg 200 mL/hr over 60 Minutes Intravenous  Once 07/03/16 0547 07/03/16 0841       Subjective: Reports feeling better today  Objective: Vitals:   07/03/16 2108 07/04/16 0519 07/04/16 0834 07/04/16 1014  BP: (!) 138/53 (!) 145/56 (!) 157/57   Pulse: 62 64    Resp: 18 17    Temp: 98.1 F (36.7 C) 97.5 F (36.4 C)    TempSrc: Oral Oral    SpO2: 95% 99%  99%  Weight:      Height:        Intake/Output Summary (Last 24 hours) at 07/04/16 1648 Last data filed at 07/04/16 1633  Gross per 24 hour  Intake              860 ml  Output             2100 ml  Net            -1240 ml   Filed Weights   07/03/16 0519 07/03/16 1142  Weight: 68 kg (150 lb) 70.8 kg (156 lb 1.6 oz)    Examination:  General exam: Appears calm and comfortable    Respiratory system: Clear to auscultation. Respiratory effort normal. Cardiovascular system: S1 & S2 heard, RRR.  Gastrointestinal system: Abdomen is nondistended, soft and nontender. No organomegaly or masses felt. Normal bowel sounds heard. Central nervous system: Alert and oriented. No focal neurological deficits. Extremities: Symmetric 5 x 5 power. Skin: No rashes, lesions Psychiatry: Judgement and insight appear normal. Mood & affect appropriate.   Data Reviewed: I have personally reviewed following labs and imaging studies  CBC:  Recent Labs Lab 07/03/16 0520 07/04/16 0457  WBC 12.5* 7.0  NEUTROABS 11.5*  --   HGB 12.2 10.9*  HCT 37.1 33.1*  MCV 98.9 98.5  PLT 205 709   Basic Metabolic Panel:  Recent Labs Lab 07/03/16 0520 07/03/16 0824 07/04/16 0457  NA 135  --  136  K 4.0  --  3.5  CL 97*  --  101  CO2 27  --  26  GLUCOSE 137*  --  95  BUN 14  --  10  CREATININE 1.12*  --  0.99  CALCIUM 8.9  --  8.8*  MG  --  1.7  --   PHOS  --  2.5  --    GFR: Estimated Creatinine Clearance: 30.3 mL/min (by C-G formula based on SCr of 0.99 mg/dL). Liver Function Tests:  Recent Labs Lab 07/03/16 0520 07/04/16 0457  AST 224* 113*  ALT 145* 130*  ALKPHOS 131* 114  BILITOT 0.8 0.7  PROT 6.5 5.5*  ALBUMIN 3.2* 2.5*   No results for input(s): LIPASE, AMYLASE in the last 168 hours. No results for input(s): AMMONIA in the last 168 hours. Coagulation Profile:  Recent Labs Lab 07/03/16 0520  INR 1.02   Cardiac Enzymes: No results for input(s): CKTOTAL, CKMB, CKMBINDEX, TROPONINI in the last 168 hours. BNP (last 3 results) No results for input(s): PROBNP in the last 8760 hours. HbA1C: No results for input(s): HGBA1C in the last 72 hours. CBG: No results for input(s): GLUCAP in the last 168 hours. Lipid Profile: No results for input(s): CHOL, HDL, LDLCALC, TRIG, CHOLHDL, LDLDIRECT in the last 72 hours. Thyroid Function Tests:  Recent Labs  07/03/16 0831   TSH 1.580   Anemia Panel: No results for input(s): VITAMINB12, FOLATE, FERRITIN, TIBC, IRON, RETICCTPCT in the last 72 hours. Sepsis Labs:  Recent Labs Lab 07/03/16 0529 07/03/16 0733 07/03/16 0837  PROCALCITON  --  0.26  --   LATICACIDVEN 2.41*  --  1.85    Recent Results (from the past 240 hour(s))  Urine culture     Status: Abnormal   Collection Time: 07/01/16  4:13 PM  Result Value Ref Range Status   Specimen Description URINE, CLEAN CATCH  Final   Special Requests NONE  Final   Culture <10,000 COLONIES/mL INSIGNIFICANT GROWTH (A)  Final   Report Status 07/02/2016 FINAL  Final  Culture, blood (Routine x 2)     Status: None (Preliminary result)   Collection Time: 07/03/16  5:20 AM  Result Value Ref Range Status   Specimen Description BLOOD LEFT HAND  Final   Special Requests IN PEDIATRIC BOTTLE 4ML  Final   Culture NO GROWTH 1 DAY  Final   Report Status PENDING  Incomplete  Culture, blood (Routine x 2)     Status: None (Preliminary result)   Collection Time: 07/03/16  5:20 AM  Result Value Ref Range Status   Specimen Description BLOOD RIGHT ARM  Final   Special Requests BOTTLES DRAWN AEROBIC AND ANAEROBIC 5ML  Final   Culture NO GROWTH 1 DAY  Final   Report Status PENDING  Incomplete  Respiratory Panel by PCR     Status: None   Collection Time: 07/03/16  5:48 AM  Result Value Ref Range Status   Adenovirus NOT DETECTED NOT DETECTED Final   Coronavirus 229E NOT DETECTED NOT DETECTED Final   Coronavirus HKU1 NOT DETECTED NOT DETECTED Final   Coronavirus NL63 NOT DETECTED NOT DETECTED Final   Coronavirus OC43 NOT DETECTED NOT DETECTED Final   Metapneumovirus NOT DETECTED NOT DETECTED Final   Rhinovirus / Enterovirus NOT DETECTED NOT DETECTED Final   Influenza A NOT DETECTED NOT DETECTED Final   Influenza B NOT DETECTED NOT DETECTED Final   Parainfluenza Virus 1 NOT DETECTED NOT DETECTED Final   Parainfluenza Virus 2 NOT DETECTED NOT DETECTED Final   Parainfluenza  Virus 3 NOT DETECTED NOT DETECTED Final   Parainfluenza Virus 4 NOT DETECTED NOT DETECTED Final   Respiratory Syncytial Virus NOT DETECTED NOT DETECTED Final   Bordetella pertussis NOT DETECTED NOT DETECTED Final   Chlamydophila pneumoniae NOT  DETECTED NOT DETECTED Final   Mycoplasma pneumoniae NOT DETECTED NOT DETECTED Final  Urine culture     Status: None (Preliminary result)   Collection Time: 07/03/16  6:00 AM  Result Value Ref Range Status   Specimen Description URINE, RANDOM  Final   Special Requests NONE  Final   Culture CULTURE REINCUBATED FOR BETTER GROWTH  Final   Report Status PENDING  Incomplete     Radiology Studies: Ct Abdomen Pelvis Wo Contrast  Result Date: 07/03/2016 CLINICAL DATA:  Fevers, UTI EXAM: CT ABDOMEN AND PELVIS WITHOUT CONTRAST TECHNIQUE: Multidetector CT imaging of the abdomen and pelvis was performed following the standard protocol without IV contrast. COMPARISON:  09/20/2013 FINDINGS: Lower chest: Mild chronic scarring is noted bilaterally. No sizable effusion is seen. Hepatobiliary: The gallbladder has been surgically removed. The liver is within normal limits. Pancreas: Unremarkable. No pancreatic ductal dilatation or surrounding inflammatory changes. Spleen: Normal in size without focal abnormality. Adrenals/Urinary Tract: The adrenal glands are within normal limits. No renal calculi or obstructive changes are seen. The bladder is well distended. Exophytic hypodensities are noted arising from the right kidney similar to those seen previously consistent with renal cysts. Stomach/Bowel: Diverticulosis of the colon is seen without definitive diverticulitis. The appendix has been surgically removed. No obstructive changes are seen. Vascular/Lymphatic: Aortic atherosclerosis. No enlarged abdominal or pelvic lymph nodes. Reproductive: Status post hysterectomy. No adnexal masses. Other: No abdominal wall hernia or abnormality. No abdominopelvic ascites. Musculoskeletal:  Degenerative changes of the lumbar spine are seen. No acute bony abnormality is noted. IMPRESSION: Stable chronic changes without acute abnormality. Electronically Signed   By: Inez Catalina M.D.   On: 07/03/2016 09:27   Dg Chest 2 View  Result Date: 07/03/2016 CLINICAL DATA:  Sepsis.  Shortness of breath tonight. EXAM: CHEST  2 VIEW COMPARISON:  Radiograph 05/27/2016, chest CT 01/19/2016 FINDINGS: Dual lead left-sided pacemaker remains in place. Unchanged heart size and mediastinal contours allowing for differences in technique. There is atherosclerosis of the aortic arch. Small bilateral pleural effusions with associated bibasilar atelectasis. Increased interstitial prominence suspicious for mild pulmonary edema. No pneumothorax. The bones are under mineralized. IMPRESSION: Small pleural effusions. Interstitial prominence suspicious for pulmonary edema. Suspect fluid overload/ CHF. Thoracic aortic atherosclerosis. Electronically Signed   By: Jeb Levering M.D.   On: 07/03/2016 05:54   Dg Chest Port 1 View  Result Date: 07/04/2016 CLINICAL DATA:  Increasing shortness of breath with wheezing today. History of CHF and recent discontinuation of Lasix. EXAM: PORTABLE CHEST 1 VIEW COMPARISON:  Portable chest x-ray of July 03, 2016. FINDINGS: The pulmonary interstitial markings are increased today. The pulmonary vascularity is more engorged. The cardiac silhouette remains enlarged. There are are small bilateral pleural effusions slightly more conspicuous today. The ICD is in stable position. There is calcification in the wall of the aortic arch. The observed bony thorax is unremarkable. IMPRESSION: Interval worsening of CHF with pulmonary interstitial edema and slight increase in small bilateral pleural effusions. Thoracic aortic atherosclerosis. Electronically Signed   By: David  Martinique M.D.   On: 07/04/2016 10:15    Scheduled Meds: . cefTRIAXone (ROCEPHIN)  IV  1 g Intravenous Q24H  . clopidogrel  75 mg  Oral Daily  . enoxaparin (LOVENOX) injection  30 mg Subcutaneous Q24H  . escitalopram  10 mg Oral Daily  . ferrous sulfate  325 mg Oral QPC lunch  . furosemide  40 mg Intravenous BID  . hydrALAZINE  25 mg Oral BID  . isosorbide mononitrate  60 mg Oral Daily  . lactobacillus acidophilus & bulgar  1 tablet Oral TID WC  . levothyroxine  50 mcg Oral QAC breakfast  . sodium chloride flush  3 mL Intravenous Q12H  . vitamin C  500 mg Oral QPC lunch  . vitamin E  400 Units Oral QPC lunch   Continuous Infusions:   LOS: 1 day   Kennethia Lynes, Orpah Melter, MD [facility] Pager [phone]  If 7PM-7AM, please contact night-coverage [email or web address] Password [facility] 07/04/2016, 4:48 PM

## 2016-07-05 ENCOUNTER — Telehealth: Payer: Self-pay | Admitting: Interventional Cardiology

## 2016-07-05 LAB — CBC
HCT: 33.3 % — ABNORMAL LOW (ref 36.0–46.0)
HEMOGLOBIN: 10.7 g/dL — AB (ref 12.0–15.0)
MCH: 31.8 pg (ref 26.0–34.0)
MCHC: 32.1 g/dL (ref 30.0–36.0)
MCV: 99.1 fL (ref 78.0–100.0)
Platelets: 188 10*3/uL (ref 150–400)
RBC: 3.36 MIL/uL — ABNORMAL LOW (ref 3.87–5.11)
RDW: 13.1 % (ref 11.5–15.5)
WBC: 5.7 10*3/uL (ref 4.0–10.5)

## 2016-07-05 LAB — COMPREHENSIVE METABOLIC PANEL
ALT: 106 U/L — AB (ref 14–54)
AST: 72 U/L — AB (ref 15–41)
Albumin: 2.6 g/dL — ABNORMAL LOW (ref 3.5–5.0)
Alkaline Phosphatase: 115 U/L (ref 38–126)
Anion gap: 11 (ref 5–15)
BUN: 11 mg/dL (ref 6–20)
CALCIUM: 8.7 mg/dL — AB (ref 8.9–10.3)
CHLORIDE: 99 mmol/L — AB (ref 101–111)
CO2: 27 mmol/L (ref 22–32)
Creatinine, Ser: 0.93 mg/dL (ref 0.44–1.00)
GFR calc Af Amer: 58 mL/min — ABNORMAL LOW (ref >60)
GFR calc non Af Amer: 50 mL/min — ABNORMAL LOW (ref >60)
Glucose, Bld: 106 mg/dL — ABNORMAL HIGH (ref 65–99)
Potassium: 3.4 mmol/L — ABNORMAL LOW (ref 3.5–5.1)
SODIUM: 137 mmol/L (ref 135–145)
Total Bilirubin: 0.5 mg/dL (ref 0.3–1.2)
Total Protein: 5.6 g/dL — ABNORMAL LOW (ref 6.5–8.1)

## 2016-07-05 LAB — PROCALCITONIN: PROCALCITONIN: 0.11 ng/mL

## 2016-07-05 MED ORDER — FOSFOMYCIN TROMETHAMINE 3 G PO PACK
3.0000 g | PACK | ORAL | Status: DC
  Administered 2016-07-05: 3 g via ORAL
  Filled 2016-07-05: qty 3

## 2016-07-05 MED ORDER — POTASSIUM CHLORIDE CRYS ER 20 MEQ PO TBCR
60.0000 meq | EXTENDED_RELEASE_TABLET | Freq: Once | ORAL | Status: AC
  Administered 2016-07-05: 60 meq via ORAL
  Filled 2016-07-05: qty 3

## 2016-07-05 MED ORDER — PANTOPRAZOLE SODIUM 40 MG PO TBEC
40.0000 mg | DELAYED_RELEASE_TABLET | Freq: Every day | ORAL | Status: DC
  Administered 2016-07-05 – 2016-07-06 (×2): 40 mg via ORAL
  Filled 2016-07-05 (×2): qty 1

## 2016-07-05 MED ORDER — PRAVASTATIN SODIUM 40 MG PO TABS
40.0000 mg | ORAL_TABLET | Freq: Every day | ORAL | Status: DC
  Administered 2016-07-05: 40 mg via ORAL
  Filled 2016-07-05: qty 1

## 2016-07-05 NOTE — Progress Notes (Addendum)
I notified MD Wyline Copas patient's daughter was asking why she wasn't on her daily potassium twice a day and her prevastatin. He said we could put her back on the statin but that he explained to the daughter yesterday we would draw her potassium every day to see her levels and order it by day.

## 2016-07-05 NOTE — Telephone Encounter (Signed)
New message      Daughter calling to let the doctor know pt is in the hosp.  Daughter want a cardiologist to see pt in [place]. Pt is on the 6th floor.

## 2016-07-05 NOTE — Telephone Encounter (Signed)
Patient's daughter calling to let Dr. Irish Lack know that her mother is in the hospital in [phone] for UTI/Sepsis. Daughter is requesting for someone from cardiology to check on her mother while she is in the hospital. I told the daughter that the doctors in the hospital will request cardiology if they felt like the patient was having a cardiac issue and needed to be seen by cardiology. Daughter still requesting that Dr. Irish Lack be made aware. Message routed.

## 2016-07-05 NOTE — Progress Notes (Signed)
PROGRESS NOTE    Laura Zuniga  MHD:[id number] DOB: Aug 17, 1917 DOA: 07/03/2016 PCP: Wenda Low, MD    Brief Narrative:  83 y.o. female with medical history significant for interstitial lung disease and pulmonary hypertension secondary to dose related amiodarone toxicity, paroxysmal atrial fibrillation not on anticoagulation, hypertension, mild dysphagia, days 3 chronic kidney disease, dyslipidemia, chronic diastolic heart failure, hypothyroidism and CAD. Patient had been complaining of dysuria 1 week with symptoms waxing and waning. By this past Sunday symptoms were severe and patient was taken to a local urgent care where urinalysis revealed likely UTI and she was prescribed Macrobid. Unfortunately symptoms worsened. Overnight she became slightly more confused and fever spiked to 101.6. Daughter called EMS to the home. Patient was transported here where she met sepsis criteria. Of note urinalysis obtained on 3/25 demonstrated insignificant growth on the urine culture  Assessment & Plan:   Principal Problem:   Sepsis ([place]) Active Problems:   HLD (hyperlipidemia)   Essential hypertension   CAD (coronary artery disease)   PAF (paroxysmal atrial fibrillation) (HCC)   Chronic diastolic heart failure (HCC)   CKD (chronic kidney disease), stage III   Cardiac pacemaker   ILD (interstitial lung disease) (HCC)   Abnormal urinalysis   Dysphagia (mild-tolerates regular/thin liquids w/ swallow techniques)   Pulmonary HTN   Transaminitis   Hypothyroidism  Principal Problem:   Sepsis with UTI  -Patient presents with fever and persistent UTI symptoms despite pharmacotherapy in the outpatient setting -Presenting sepsis physiology with Temp > 100.9, RR >20, WBC > 12,000 w/ > 10% bands, elevated lactate, transaminitis  -Lactate has normalized -Patient was given aggressive volume resuscitation  -Urine cx thus far showing 20,000 enterococcus faecalis -Influenza PCR was negative - respiratory  viral panel reviewed: negative  -Discussed with pharmacy. Given allergy profile. Will give dose of fosfomycin with plan for additional dose in 72hrs  Active Problems:   ILD (interstitial lung disease)/ Pulmonary HTN -Followed by Dr. Byrum/pulmonology in the OP setting -Etiology secondary to initial higher dosing of amiodarone-stable on low-dose amiodarone -Continue supportive care (nebs, prn O2) -Previously diagnosed with COPD but normal PFTs so COPD r/o'd -Patient has hx of chronic aspiration noted -Echo 2015: PA pressure 52 mmHg -Have repeated CXR and reviewed. Findings of worsened pulm edema -Patient is tolerating IV lasix bid -Will repeat bmet in AM    Abnormal urinalysis -Initially txd with Macrobid beginning 3/25 -Symptoms have worsened -Urine Cx on 3/25 insignificant growth -Blood cultures on 3/25 NGTD -Follow-up on repeat urine cx, pending -CT abd/pelvis without acute abnormality -Patient continued on Rocephin IV -Pyridium for dysuria -Started lactobacillus for history of C. difficile colitis       Essential hypertension -BP currently stable -Continued on preadmission hydralazine    PAF (paroxysmal atrial fibrillation) /permanent pacemaker -Not on anticoagulation secondary to advanced age and frequent falls -Current rate controlled -CHADVASc=6 -currently stable    Acute on chronic diastolic heart failure  -On PO lasix at 80 mg a.m., 40 mg p.m. and can take prn 40 mg xtra or swelling -volume resuscitated overnight for presenting sepsis -Repeat CXR reviewed with findings of worsened CHF -Tolerating IV laisix BID    CKD (chronic kidney disease), stage III -Renal function remains stable and at baseline -repeat bmet in AM    Dysphagia (mild-tolerates regular/thin liquids w/ swallow techniques) -No worsening of issues related to intermittent coughing and/or choking with eating -Stable with prescribed diet and utilization of swallowing techniques -Presently  stable    Transaminitis - LFTs  noted to be normal on 3/1 -At presentation AP 131, AST 224, ALT 145 with normal total bilirubin -Stopped Macrobid -Held amiodarone and Pravachol at admission -LFT's continuing to improve    HLD (hyperlipidemia) - Pravachol per above    CAD (coronary artery disease) -currently asymptomatic -Continue Plavix -Not on BB prior to admission    Hypothyroidism -Continue on Synthroid -TSH 4.217 (2016) -TSH is within normal limits   DVT prophylaxis: Lovenox subQ Code Status: Full Family Communication: Pt in room, family at bedside Disposition Plan: Uncertain at this time  Consultants:     Procedures:     Antimicrobials: Anti-infectives    Start     Dose/Rate Route Frequency Ordered Stop   07/04/16 0600  cefTRIAXone (ROCEPHIN) 1 g in dextrose 5 % 50 mL IVPB  Status:  Discontinued     1 g 100 mL/hr over 30 Minutes Intravenous Every 24 hours 07/03/16 0823 07/05/16 1141   07/03/16 0600  levofloxacin (LEVAQUIN) IVPB 750 mg     750 mg 100 mL/hr over 90 Minutes Intravenous  Once 07/03/16 0547 07/03/16 0750   07/03/16 0600  aztreonam (AZACTAM) 2 g in dextrose 5 % 50 mL IVPB     2 g 100 mL/hr over 30 Minutes Intravenous  Once 07/03/16 0547 07/03/16 0745   07/03/16 0600  vancomycin (VANCOCIN) IVPB 1000 mg/200 mL premix     1,000 mg 200 mL/hr over 60 Minutes Intravenous  Once 07/03/16 0547 07/03/16 0841      Subjective: States feeling better  Objective: Vitals:   07/04/16 1721 07/04/16 2206 07/05/16 0558 07/05/16 1243  BP: (!) 125/56 (!) 142/46 (!) 162/53 115/66  Pulse: 63 64 61 90  Resp: '18 17 17 18  ' Temp: 98.5 F (36.9 C) 97.9 F (36.6 C) 97.5 F (36.4 C) 98.2 F (36.8 C)  TempSrc: Oral Oral Oral Oral  SpO2: 95% 95% 97% 99%  Weight: 70.4 kg (155 lb 3.3 oz)     Height:        Intake/Output Summary (Last 24 hours) at 07/05/16 1731 Last data filed at 07/05/16 1357  Gross per 24 hour  Intake             1223 ml  Output               200 ml  Net             1023 ml   Filed Weights   07/03/16 0519 07/03/16 1142 07/04/16 1721  Weight: 68 kg (150 lb) 70.8 kg (156 lb 1.6 oz) 70.4 kg (155 lb 3.3 oz)    Examination:  General exam: awake, laying in bed, in nad  Respiratory system: normal resp effort, no audible wheezing Cardiovascular system: regular rate, s1-2  Gastrointestinal system: soft, nondistended, pos bs Central nervous system: cn2-12 grossly intact, strength intact Extremities: perfused, no clubbing Skin: normal skin turgor, no notable skin lesions seen Psychiatry: mood normal// no visual hallucinations   Data Reviewed: I have personally reviewed following labs and imaging studies  CBC:  Recent Labs Lab 07/03/16 0520 07/04/16 0457 07/05/16 0543  WBC 12.5* 7.0 5.7  NEUTROABS 11.5*  --   --   HGB 12.2 10.9* 10.7*  HCT 37.1 33.1* 33.3*  MCV 98.9 98.5 99.1  PLT 205 183 169   Basic Metabolic Panel:  Recent Labs Lab 07/03/16 0520 07/03/16 0824 07/04/16 0457 07/05/16 0543  NA 135  --  136 137  K 4.0  --  3.5 3.4*  CL 97*  --  101 99*  CO2 27  --  26 27  GLUCOSE 137*  --  95 106*  BUN 14  --  10 11  CREATININE 1.12*  --  0.99 0.93  CALCIUM 8.9  --  8.8* 8.7*  MG  --  1.7  --   --   PHOS  --  2.5  --   --    GFR: Estimated Creatinine Clearance: 32.2 mL/min (by C-G formula based on SCr of 0.93 mg/dL). Liver Function Tests:  Recent Labs Lab 07/03/16 0520 07/04/16 0457 07/05/16 0543  AST 224* 113* 72*  ALT 145* 130* 106*  ALKPHOS 131* 114 115  BILITOT 0.8 0.7 0.5  PROT 6.5 5.5* 5.6*  ALBUMIN 3.2* 2.5* 2.6*   No results for input(s): LIPASE, AMYLASE in the last 168 hours. No results for input(s): AMMONIA in the last 168 hours. Coagulation Profile:  Recent Labs Lab 07/03/16 0520  INR 1.02   Cardiac Enzymes: No results for input(s): CKTOTAL, CKMB, CKMBINDEX, TROPONINI in the last 168 hours. BNP (last 3 results) No results for input(s): PROBNP in the last 8760  hours. HbA1C: No results for input(s): HGBA1C in the last 72 hours. CBG: No results for input(s): GLUCAP in the last 168 hours. Lipid Profile: No results for input(s): CHOL, HDL, LDLCALC, TRIG, CHOLHDL, LDLDIRECT in the last 72 hours. Thyroid Function Tests:  Recent Labs  07/03/16 0831  TSH 1.580   Anemia Panel: No results for input(s): VITAMINB12, FOLATE, FERRITIN, TIBC, IRON, RETICCTPCT in the last 72 hours. Sepsis Labs:  Recent Labs Lab 07/03/16 0529 07/03/16 0733 07/03/16 0837 07/05/16 0543  PROCALCITON  --  0.26  --  0.11  LATICACIDVEN 2.41*  --  1.85  --     Recent Results (from the past 240 hour(s))  Urine culture     Status: Abnormal   Collection Time: 07/01/16  4:13 PM  Result Value Ref Range Status   Specimen Description URINE, CLEAN CATCH  Final   Special Requests NONE  Final   Culture <10,000 COLONIES/mL INSIGNIFICANT GROWTH (A)  Final   Report Status 07/02/2016 FINAL  Final  Culture, blood (Routine x 2)     Status: None (Preliminary result)   Collection Time: 07/03/16  5:20 AM  Result Value Ref Range Status   Specimen Description BLOOD LEFT HAND  Final   Special Requests IN PEDIATRIC BOTTLE 4ML  Final   Culture NO GROWTH 2 DAYS  Final   Report Status PENDING  Incomplete  Culture, blood (Routine x 2)     Status: None (Preliminary result)   Collection Time: 07/03/16  5:20 AM  Result Value Ref Range Status   Specimen Description BLOOD RIGHT ARM  Final   Special Requests BOTTLES DRAWN AEROBIC AND ANAEROBIC 5ML  Final   Culture NO GROWTH 2 DAYS  Final   Report Status PENDING  Incomplete  Respiratory Panel by PCR     Status: None   Collection Time: 07/03/16  5:48 AM  Result Value Ref Range Status   Adenovirus NOT DETECTED NOT DETECTED Final   Coronavirus 229E NOT DETECTED NOT DETECTED Final   Coronavirus HKU1 NOT DETECTED NOT DETECTED Final   Coronavirus NL63 NOT DETECTED NOT DETECTED Final   Coronavirus OC43 NOT DETECTED NOT DETECTED Final    Metapneumovirus NOT DETECTED NOT DETECTED Final   Rhinovirus / Enterovirus NOT DETECTED NOT DETECTED Final   Influenza A NOT DETECTED NOT DETECTED Final   Influenza B NOT DETECTED NOT DETECTED Final   Parainfluenza  Virus 1 NOT DETECTED NOT DETECTED Final   Parainfluenza Virus 2 NOT DETECTED NOT DETECTED Final   Parainfluenza Virus 3 NOT DETECTED NOT DETECTED Final   Parainfluenza Virus 4 NOT DETECTED NOT DETECTED Final   Respiratory Syncytial Virus NOT DETECTED NOT DETECTED Final   Bordetella pertussis NOT DETECTED NOT DETECTED Final   Chlamydophila pneumoniae NOT DETECTED NOT DETECTED Final   Mycoplasma pneumoniae NOT DETECTED NOT DETECTED Final  Urine culture     Status: Abnormal (Preliminary result)   Collection Time: 07/03/16  6:00 AM  Result Value Ref Range Status   Specimen Description URINE, RANDOM  Final   Special Requests NONE  Final   Culture (A)  Final    20,000 COLONIES/mL ENTEROCOCCUS FAECALIS SUSCEPTIBILITIES TO FOLLOW    Report Status PENDING  Incomplete  MRSA PCR Screening     Status: None   Collection Time: 07/04/16  4:22 PM  Result Value Ref Range Status   MRSA by PCR NEGATIVE NEGATIVE Final    Comment:        The GeneXpert MRSA Assay (FDA approved for NASAL specimens only), is one component of a comprehensive MRSA colonization surveillance program. It is not intended to diagnose MRSA infection nor to guide or monitor treatment for MRSA infections.      Radiology Studies: Dg Chest Port 1 View  Result Date: 07/04/2016 CLINICAL DATA:  Increasing shortness of breath with wheezing today. History of CHF and recent discontinuation of Lasix. EXAM: PORTABLE CHEST 1 VIEW COMPARISON:  Portable chest x-ray of July 03, 2016. FINDINGS: The pulmonary interstitial markings are increased today. The pulmonary vascularity is more engorged. The cardiac silhouette remains enlarged. There are are small bilateral pleural effusions slightly more conspicuous today. The ICD is in  stable position. There is calcification in the wall of the aortic arch. The observed bony thorax is unremarkable. IMPRESSION: Interval worsening of CHF with pulmonary interstitial edema and slight increase in small bilateral pleural effusions. Thoracic aortic atherosclerosis. Electronically Signed   By: David  Martinique M.D.   On: 07/04/2016 10:15    Scheduled Meds: . clopidogrel  75 mg Oral Daily  . enoxaparin (LOVENOX) injection  30 mg Subcutaneous Q24H  . escitalopram  10 mg Oral Daily  . ferrous sulfate  325 mg Oral QPC lunch  . fosfomycin  3 g Oral Q72H  . furosemide  40 mg Intravenous BID  . hydrALAZINE  25 mg Oral BID  . isosorbide mononitrate  60 mg Oral Daily  . lactobacillus acidophilus & bulgar  1 tablet Oral TID WC  . levothyroxine  50 mcg Oral QAC breakfast  . sodium chloride flush  3 mL Intravenous Q12H  . vitamin C  500 mg Oral QPC lunch  . vitamin E  400 Units Oral QPC lunch   Continuous Infusions:   LOS: 2 days   Itati Brocksmith, Orpah Melter, MD [facility] Pager [phone]  If 7PM-7AM, please contact night-coverage [email or web address] Password [facility] 07/05/2016, 5:31 PM

## 2016-07-06 DIAGNOSIS — N183 Chronic kidney disease, stage 3 (moderate): Secondary | ICD-10-CM

## 2016-07-06 DIAGNOSIS — I25119 Atherosclerotic heart disease of native coronary artery with unspecified angina pectoris: Secondary | ICD-10-CM

## 2016-07-06 LAB — URINE CULTURE

## 2016-07-06 LAB — COMPREHENSIVE METABOLIC PANEL
ALT: 88 U/L — ABNORMAL HIGH (ref 14–54)
ANION GAP: 7 (ref 5–15)
AST: 51 U/L — ABNORMAL HIGH (ref 15–41)
Albumin: 2.7 g/dL — ABNORMAL LOW (ref 3.5–5.0)
Alkaline Phosphatase: 105 U/L (ref 38–126)
BUN: 10 mg/dL (ref 6–20)
CHLORIDE: 102 mmol/L (ref 101–111)
CO2: 30 mmol/L (ref 22–32)
Calcium: 8.8 mg/dL — ABNORMAL LOW (ref 8.9–10.3)
Creatinine, Ser: 0.97 mg/dL (ref 0.44–1.00)
GFR calc Af Amer: 55 mL/min — ABNORMAL LOW (ref >60)
GFR calc non Af Amer: 48 mL/min — ABNORMAL LOW (ref >60)
GLUCOSE: 100 mg/dL — AB (ref 65–99)
POTASSIUM: 4 mmol/L (ref 3.5–5.1)
Sodium: 139 mmol/L (ref 135–145)
Total Bilirubin: 0.7 mg/dL (ref 0.3–1.2)
Total Protein: 5.9 g/dL — ABNORMAL LOW (ref 6.5–8.1)

## 2016-07-06 LAB — CBC
HEMATOCRIT: 32.7 % — AB (ref 36.0–46.0)
Hemoglobin: 10.6 g/dL — ABNORMAL LOW (ref 12.0–15.0)
MCH: 32.1 pg (ref 26.0–34.0)
MCHC: 32.4 g/dL (ref 30.0–36.0)
MCV: 99.1 fL (ref 78.0–100.0)
Platelets: 198 10*3/uL (ref 150–400)
RBC: 3.3 MIL/uL — ABNORMAL LOW (ref 3.87–5.11)
RDW: 12.9 % (ref 11.5–15.5)
WBC: 5 10*3/uL (ref 4.0–10.5)

## 2016-07-06 MED ORDER — FOSFOMYCIN TROMETHAMINE 3 G PO PACK: 3.0000 g | PACK | ORAL | 0 refills | Status: DC

## 2016-07-06 NOTE — Care Management Note (Signed)
Case Management Note  Patient Details  Name: ADALYND DONAHOE MRN: [id number] Date of Birth: 1917-07-02  Subjective/Objective:                    Action/Plan: CM consulted for home oxygen and HH PT. CM met with the patient and her daughter and provided a list for DME and [facility] services. They selected [place] for both. Jermaine with [facility] DME notified about the home oxygen and Santiago Glad about the [facility] services.  CM saw patient walking in halls without oxygen and she desated to 87%. CM asked beside RN to please fill out pulmonary flow sheet for the home oxygen.  Pts daughter inquired about a fanny pack for home for her oxygen. Jemaine aware and will discuss with patient and daughter.   Expected Discharge Date:  07/06/16               Expected Discharge Plan:  [place]  In-House Referral:     Discharge planning Services  CM Consult  Post Acute Care Choice:  Home Health, Durable Medical Equipment Choice offered to:  Patient, Adult Children  DME Arranged:  Oxygen DME Agency:  [place]:  PT [place]:  [place]  Status of Service:  Completed, signed off  If discussed at [place] of Stay Meetings, dates discussed:    Additional Comments:  Pollie Friar, RN 07/06/2016, 2:04 PM

## 2016-07-06 NOTE — Progress Notes (Signed)
Occupational Therapy Treatment Patient Details Name: Laura Zuniga MRN: [id number] DOB: [birth year] Today's Date: 07/06/2016    History of present illness  Laura Zuniga is a 83 y.o. female with medical history significant for interstitial lung disease and pulmonary hypertension secondary to dose related amiodarone toxicity, paroxysmal atrial fibrillation not on anticoagulation, hypertension, mild dysphagia, days 3 chronic kidney disease, dyslipidemia, chronic diastolic heart failure, hypothyroidism and CAD. Patient had been complaining of dysuria 1 week with symptoms waxing and waning. Positive for UTI and sepsis.    OT comments  Pt performed LB dressing and grooming at sink with Min guard A for safety. She demonstrated good function to perform ADLs. However, demonstrated decreased activity tolerance as seen by drops in SpO2 during activity and dyspnea (level 2). Feel pt in progressing well towards goals. Continue to recommend dc home with daughter once medically stable.    Follow Up Recommendations  No OT follow up;Supervision/Assistance - 24 hour    Equipment Recommendations  None recommended by OT    Recommendations for Other Services      Precautions / Restrictions Precautions Precautions: Fall Restrictions Weight Bearing Restrictions: No       Mobility Bed Mobility               General bed mobility comments: In recliner upon recliner  Transfers Overall transfer level: Needs assistance Equipment used: Rolling walker (2 wheeled) Transfers: Sit to/from Stand Sit to Stand: Min guard         General transfer comment: Needed cues for hand placement.     Balance Overall balance assessment: Needs assistance Sitting-balance support: No upper extremity supported;Feet supported Sitting balance-Leahy Scale: Good Sitting balance - Comments: Able to don pants with Min guard for safety   Standing balance support: Bilateral upper extremity supported;During  functional activity Standing balance-Leahy Scale: Fair Standing balance comment: Pt able to stand at sink and weight shift while performing grooming. benefits from [place] guard for safety                           ADL either performed or assessed with clinical judgement   ADL Overall ADL's : Needs assistance/impaired     Grooming: Oral care;Wash/dry face;Brushing hair;Min guard;Standing Grooming Details (indicate cue type and reason): Pt completed grooming at sink with min guard and some A for low vision limitations             Lower Body Dressing: Min guard;Sit to/from stand Lower Body Dressing Details (indicate cue type and reason): Pt donned pants with Min guard for sit<>stand Toilet Transfer: Min guard;Ambulation;RW   Toileting- Clothing Manipulation and Hygiene: Set up;Sitting/lateral lean;Supervision/safety Toileting - Clothing Manipulation Details (indicate cue type and reason): Educated pt on wiping techniques to prevent UTIs     Functional mobility during ADLs: Min Museum/gallery exhibitions officer      Cognition Arousal/Alertness: Awake/alert Behavior During Therapy: WFL for tasks assessed/performed Overall Cognitive Status: Within Functional Limits for tasks assessed                                          Exercises     Shoulder Instructions       General Comments Daughter in the room for session. Pt SpO2 dropped to 78  with activity (i.e. leaning for LB dressing and functional mobility). At rest, she returned quickly to above 90s    Pertinent Vitals/ Pain       Pain Assessment: No/denies pain  Home Living                                          Prior Functioning/Environment              Frequency  Min 1X/week        Progress Toward Goals  OT Goals(current goals can now be found in the care plan section)  Progress towards OT goals: Progressing toward  goals  Acute Rehab OT Goals Patient Stated Goal: Go home OT Goal Formulation: With patient Time For Goal Achievement: 07/18/16 Potential to Achieve Goals: Good ADL Goals Pt Will Perform Grooming: with supervision;standing Pt Will Perform Upper Body Dressing: with set-up;with supervision;sitting Pt Will Transfer to Toilet: with supervision;regular height toilet;grab bars;ambulating Additional ADL Goal #1: Pt and family will independently verbalize three fall prevention strategies  Plan Discharge plan remains appropriate    Co-evaluation                 End of Session Equipment Utilized During Treatment: Rolling walker  OT Visit Diagnosis: Unsteadiness on feet (R26.81);Muscle weakness (generalized) (M62.81)   Activity Tolerance Patient tolerated treatment well   Patient Left in chair;with call bell/phone within reach;with family/visitor present   Nurse Communication Mobility status        Time: 2353-6144 OT Time Calculation (min): 30 min  Charges: OT General Charges $OT Visit: 1 Procedure OT Treatments $Self Care/Home Management : 23-37 mins  [place], OTR/L [place] 07/06/2016, 2:00 PM

## 2016-07-06 NOTE — Progress Notes (Addendum)
Vertell Limber Hersch to be D/C'd  per MD order. Discussed with the patient and all questions fully answered.  VSS, Skin clean, dry and intact without evidence of skin break down, no evidence of skin tears noted.  IV catheter discontinued intact. Site without signs and symptoms of complications. Dressing and pressure applied.  An After Visit Summary was printed and given to the patient. Patient received prescription.  D/c education completed with patient/family including follow up instructions, medication list, d/c activities limitations if indicated, with other d/c instructions as indicated by MD - patient able to verbalize understanding, all questions fully answered.   Patient instructed to return to ED, call 911, or call MD for any changes in condition.   Home oxygen delivered and taken with patient.  Patient to be escorted via [place], and D/C home via private auto.

## 2016-07-06 NOTE — Discharge Summary (Signed)
Physician Discharge Summary  Danique Hartsough Meryle Ready SLH:[id number] DOB: 10/08/1917 DOA: 07/03/2016  PCP: Wenda Low, MD  Admit date: 07/03/2016 Discharge date: 07/06/2016  Admitted From: Home Disposition: Home   Recommendations for Outpatient Follow-up:  1. Follow up with PCP in 1-2 weeks 2. Please obtain BMP/CBC in one week 3. Monitor LFTs, mild transaminitis noted, improving. Held statin and macrobid. Amiodarone was held during admission, but will be restarted due to risk of AFib w/RVR and its low dose.  4. Please follow up on the following pending results: Blood cultures from 3/27.   Home Health: PT Equipment/Devices: 2 L O2 prn Discharge Condition: Stable CODE STATUS: Full Diet recommendation: Heart healthy  Brief/Interim Summary: 83 y.o.femalewith medical history significant for interstitial lung disease and pulmonary hypertension secondary to dose related amiodarone toxicity, paroxysmal atrial fibrillation not on anticoagulation, hypertension, mild dysphagia, days 3 chronic kidney disease, dyslipidemia, chronic diastolic heart failure,hypothyroidism and CAD. Patient had been complaining of dysuria 1 week with symptoms waxing and waning. By this past Sunday symptoms were severe and patient was taken to a local urgent care where urinalysis revealed likely UTI and she was prescribed Macrobid. Unfortunately symptoms worsened. Overnight she became slightly more confused and fever spiked to 101.6. Daughter called EMS to the home. Patient was transported here where she met sepsis criteria.Of note urinalysis obtained on 3/25 demonstrated insignificant growth on the urine culture.   Discharge Diagnoses:  Principal Problem:   Sepsis ([place]) Active Problems:   HLD (hyperlipidemia)   Essential hypertension   CAD (coronary artery disease)   PAF (paroxysmal atrial fibrillation) (HCC)   Chronic diastolic heart failure (HCC)   CKD (chronic kidney disease), stage III   Cardiac pacemaker   ILD  (interstitial lung disease) (HCC)   Abnormal urinalysis   Dysphagia (mild-tolerates regular/thin liquids w/ swallow techniques)   Pulmonary HTN   Transaminitis   Hypothyroidism  Sepsis with UTI  -Patient presents with fever and persistent UTI symptoms despite pharmacotherapy in the outpatient setting. Presenting sepsis physiology with Temp >100.9, RR >20, WBC >12,000 w/ >10% bands, elevated lactate, transaminitis. Flu pcr negative, RVP negative.  -Lactate has normalized with IVF's, UCx showed insignificant growth.  -Completed 3 days of aztreonam > CTX. Pt also got fosfomycin 3/29 and will repeat as outpatient on 4/1 (72 hrs).   ILD (interstitial lung disease)/Pulmonary HTN -Followed by Dr. Rise Paganini in the [facility]. Previously diagnosed with COPD but normal PFTs so COPD r/o'd. Patient has hx of chronic aspiration noted. Echo 2015: PA pressure 52 mmHg -Etiology secondary to initial higher dosing of amiodarone-stable on low-dose amiodarone -Continue supportive care (nebs, prn O2); will qualify for home oxygen prn prior to discharge -repeated CXR and reviewed. Findings of worsened pulm edema without infiltrate.  -Patient is tolerating IV lasix BID, Creatinine, K are stable.  -Will repeat bmet at follow up  Abnormal urinalysis -Initially txd with Macrobid beginning 3/25 -Symptoms have worsened -Urine Cxon 3/25 insignificant growth -Blood cultures on 3/25 NGTD -CT abd/pelvis without acute abnormality -Patient continued on Rocephin IV -Pyridium for dysuria -Started lactobacillus for history of C. difficile colitis. Had loose stool 3/30 AM x1. Not watery, and had been on stool softeners per pt.   Essential hypertension -BP currently stable -Continued on preadmission hydralazine  PAF (paroxysmal atrial fibrillation) /permanent pacemaker -Not on anticoagulation secondary to advanced age and frequent falls -Current rate controlled -CHADVASc=6 -currently  stable  Hypokalemia: Acute, likely due to diuresis. Resolved with repletion.   Acute on chronic diastolic heart failure  -On PO  lasix at 80 mg a.m., 40 mg p.m. and can take prn 40 mg xtraor swelling -volume resuscitated overnight for presenting sepsis. Repeat CXR reviewed with findings of worsened CHF. Improving with resolution of crackles after IV laisix BID. - Will qualify for oxygen prior to discharge  CKD (chronic kidney disease), stage III -Renal function remains stable and at baseline -repeat bmet in AM  Dysphagia (mild-tolerates regular/thin liquids w/ swallow techniques) -No worsening of issues related to intermittent coughing and/or choking with eating -Stable with prescribed diet and utilization of swallowing techniques -Presently stable  Transaminitis - LFTs noted to be normal on 3/1 -At presentation AP131, AST 224, ALT 145 with normal total bilirubin -Stopped Macrobid -Held amiodarone and Pravachol at admission -LFT's continuing to improve  HLD (hyperlipidemia) - Pravachol per above  CAD (coronary artery disease) -currently asymptomatic -Continue Plavix -Not on BBprior to admission  Hypothyroidism -Continue on Synthroid -TSH 4.217 (2016) -TSH is within normal limits  Iron-deficiency anemia: Continue home iron supplementation. Hemoglobin stable.   Discharge Instructions Discharge Instructions    (HEART FAILURE PATIENTS) Call MD:  Anytime you have any of the following symptoms: 1) 3 pound weight gain in 24 hours or 5 pounds in 1 week 2) shortness of breath, with or without a dry hacking cough 3) swelling in the hands, feet or stomach 4) if you have to sleep on extra pillows at night in order to breathe.    Complete by:  As directed    Diet - low sodium heart healthy    Complete by:  As directed    Discharge instructions    Complete by:  As directed    You were admitted for sepsis due to a UTI which has been treated with antibiotics.  You are stable for discharge with the following recommendations:  - Continue taking antibiotics: Fosfomycin was sent to your pharmacy and should be taken as directed on April 1st. This will complete your course of antibiotics.  - Stop taking macrobid and pravastatin. You may restart pravastatin after you follow up with your doctor.  - Continue taking lasix and other medications as usual.  - Make a follow up appointment with your PCP in the next week for labs and re-evaluation.  - If your symptoms return, seek medical attention right away.  - You will be receiving home hleaht physical therapy and oxygen.   Increase activity slowly    Complete by:  As directed      Allergies as of 07/06/2016      Reactions   Penicillins Other (See Comments)   Unknown allergic reaction   2,4-d Dimethylamine (amisol) Other (See Comments)   Ace Inhibitors Other (See Comments)   Lanolin Hives   Vancomycin    Red man syndrome   Sulfonamide Derivatives Swelling, Rash      Medication List    STOP taking these medications   nitrofurantoin (macrocrystal-monohydrate) 100 MG capsule Commonly known as:  MACROBID   pravastatin 40 MG tablet Commonly known as:  PRAVACHOL     TAKE these medications   acetaminophen 500 MG tablet Commonly known as:  TYLENOL Take 2 tablets (1,000 mg total) by mouth every 6 (six) hours as needed for mild pain or moderate pain.   albuterol 108 (90 Base) MCG/ACT inhaler Commonly known as:  PROVENTIL HFA;VENTOLIN HFA Inhale 2 puffs into the lungs every 6 (six) hours as needed for wheezing or shortness of breath.   amiodarone 100 MG tablet Commonly known as:  PACERONE Take 1 tablet (  100 mg total) by mouth daily.   Biotin 1 MG Caps Take 1 mg by mouth daily.   clopidogrel 75 MG tablet Commonly known as:  PLAVIX TAKE 75 MG BY MOUTH ONCE DAILY   Cranberry 250 MG Caps Take 250 mg by mouth daily after lunch.   docusate sodium 100 MG capsule Commonly known as:  COLACE Take  100 mg by mouth daily as needed for mild constipation.   escitalopram 10 MG tablet Commonly known as:  LEXAPRO Take 10 mg by mouth daily.   ferrous sulfate 325 (65 FE) MG tablet Take 325 mg by mouth daily after lunch.   fosfomycin 3 g Pack Commonly known as:  MONUROL Take 3 g by mouth every 3 (three) days. Start taking on:  07/08/2016   furosemide 40 MG tablet Commonly known as:  LASIX TAKE TWO TABLETS BY MOUTH IN THE MORNING, THEN TAKE ONE IN THE EVENING, YOU CAN TAKE AN EXTRA 40 MG IN THE EVENING IF NEEDED What changed:  See the new instructions.   hydrALAZINE 25 MG tablet Commonly known as:  APRESOLINE Take 1 tablet (25 mg total) by mouth 2 (two) times daily.   ICAPS AREDS 2 PO Take 1 tablet by mouth daily.   isosorbide mononitrate 60 MG 24 hr tablet Commonly known as:  IMDUR TAKE ONE TABLET BY MOUTH ONCE DAILY What changed:  See the new instructions.   levothyroxine 50 MCG tablet Commonly known as:  SYNTHROID, LEVOTHROID Take 50 mcg by mouth daily before breakfast.   ondansetron 8 MG tablet Commonly known as:  ZOFRAN Take 1 tablet (8 mg total) by mouth every 8 (eight) hours as needed for nausea or vomiting.   pantoprazole 40 MG tablet Commonly known as:  PROTONIX Take 40 mg by mouth daily after lunch.   potassium chloride SA 20 MEQ tablet Commonly known as:  K-DUR,KLOR-CON Take 20 mEq by mouth 2 (two) times daily.   vitamin C 500 MG tablet Commonly known as:  ASCORBIC ACID Take 500 mg by mouth daily after lunch.   vitamin E 400 UNIT capsule Take 400 Units by mouth daily after lunch.            Durable Medical Equipment        Start     Ordered   07/06/16 1320  DME Oxygen  Once    Question Answer Comment  Mode or (Route) Nasal cannula   Liters per Minute 2   Frequency Continuous (stationary and portable oxygen unit needed)   Oxygen delivery system Gas      07/06/16 1323     Follow-up Duncan, MD. Schedule an appointment  as soon as possible for a visit in 1 week(s).   Specialty:  Internal Medicine Contact information: [address] [place] [address] [phone]          Allergies  Allergen Reactions  . Penicillins Other (See Comments)    Unknown allergic reaction  . 2,4-D Dimethylamine (Amisol) Other (See Comments)  . Ace Inhibitors Other (See Comments)  . Lanolin Hives  . Vancomycin     Red man syndrome  . Sulfonamide Derivatives Swelling and Rash    Consultations:  None  Procedures/Studies: Ct Abdomen Pelvis Wo Contrast  Result Date: 07/03/2016 CLINICAL DATA:  Fevers, UTI EXAM: CT ABDOMEN AND PELVIS WITHOUT CONTRAST TECHNIQUE: Multidetector CT imaging of the abdomen and pelvis was performed following the standard protocol without IV contrast. COMPARISON:  09/20/2013 FINDINGS: Lower chest:  Mild chronic scarring is noted bilaterally. No sizable effusion is seen. Hepatobiliary: The gallbladder has been surgically removed. The liver is within normal limits. Pancreas: Unremarkable. No pancreatic ductal dilatation or surrounding inflammatory changes. Spleen: Normal in size without focal abnormality. Adrenals/Urinary Tract: The adrenal glands are within normal limits. No renal calculi or obstructive changes are seen. The bladder is well distended. Exophytic hypodensities are noted arising from the right kidney similar to those seen previously consistent with renal cysts. Stomach/Bowel: Diverticulosis of the colon is seen without definitive diverticulitis. The appendix has been surgically removed. No obstructive changes are seen. Vascular/Lymphatic: Aortic atherosclerosis. No enlarged abdominal or pelvic lymph nodes. Reproductive: Status post hysterectomy. No adnexal masses. Other: No abdominal wall hernia or abnormality. No abdominopelvic ascites. Musculoskeletal: Degenerative changes of the lumbar spine are seen. No acute bony abnormality is noted. IMPRESSION: Stable chronic changes  without acute abnormality. Electronically Signed   By: Inez Catalina M.D.   On: 07/03/2016 09:27   Dg Chest 2 View  Result Date: 07/03/2016 CLINICAL DATA:  Sepsis.  Shortness of breath tonight. EXAM: CHEST  2 VIEW COMPARISON:  Radiograph 05/27/2016, chest CT 01/19/2016 FINDINGS: Dual lead left-sided pacemaker remains in place. Unchanged heart size and mediastinal contours allowing for differences in technique. There is atherosclerosis of the aortic arch. Small bilateral pleural effusions with associated bibasilar atelectasis. Increased interstitial prominence suspicious for mild pulmonary edema. No pneumothorax. The bones are under mineralized. IMPRESSION: Small pleural effusions. Interstitial prominence suspicious for pulmonary edema. Suspect fluid overload/ CHF. Thoracic aortic atherosclerosis. Electronically Signed   By: Jeb Levering M.D.   On: 07/03/2016 05:54   Dg Chest Port 1 View  Result Date: 07/04/2016 CLINICAL DATA:  Increasing shortness of breath with wheezing today. History of CHF and recent discontinuation of Lasix. EXAM: PORTABLE CHEST 1 VIEW COMPARISON:  Portable chest x-ray of July 03, 2016. FINDINGS: The pulmonary interstitial markings are increased today. The pulmonary vascularity is more engorged. The cardiac silhouette remains enlarged. There are are small bilateral pleural effusions slightly more conspicuous today. The ICD is in stable position. There is calcification in the wall of the aortic arch. The observed bony thorax is unremarkable. IMPRESSION: Interval worsening of CHF with pulmonary interstitial edema and slight increase in small bilateral pleural effusions. Thoracic aortic atherosclerosis. Electronically Signed   By: David  Martinique M.D.   On: 07/04/2016 10:15   Subjective: Patient and daughter at bedside report that she is improving, generally. No oxygen requirement at rest, but feels very short of breath with any exertion, which is actually her baseline. Legs are less  swollen. Mentating at baseline.   Discharge Exam: BP (!) 144/47 (BP Location: Right Arm)   Pulse (!) 58   Temp 97.5 F (36.4 C) (Oral)   Resp 18   Ht 5' 1.5" (1.562 m)   Wt 68.1 kg (150 lb 3.2 oz)   SpO2 96%   BMI 27.92 kg/m    General: Pt is alert, awake, not in acute distress Cardiovascular: Irreg, S1/S2 +, no rubs, no gallops Respiratory: CTA bilaterally, no wheezing, no rhonchi. No crackles.  Abdominal: Soft, NT, ND, bowel sounds + Extremities: No edema, no cyanosis  Labs: BNP (last 3 results)  Recent Labs  12/28/15 2204 07/03/16 0520  BNP 123.8* 644.0*   Basic Metabolic Panel:  Recent Labs Lab 07/03/16 0520 07/03/16 0824 07/04/16 0457 07/05/16 0543 07/06/16 0619  NA 135  --  136 137 139  K 4.0  --  3.5 3.4* 4.0  CL 97*  --  101 99* 102  CO2 27  --  '26 27 30  ' GLUCOSE 137*  --  95 106* 100*  BUN 14  --  '10 11 10  ' CREATININE 1.12*  --  0.99 0.93 0.97  CALCIUM 8.9  --  8.8* 8.7* 8.8*  MG  --  1.7  --   --   --   PHOS  --  2.5  --   --   --    Liver Function Tests:  Recent Labs Lab 07/03/16 0520 07/04/16 0457 07/05/16 0543 07/06/16 0619  AST 224* 113* 72* 51*  ALT 145* 130* 106* 88*  ALKPHOS 131* 114 115 105  BILITOT 0.8 0.7 0.5 0.7  PROT 6.5 5.5* 5.6* 5.9*  ALBUMIN 3.2* 2.5* 2.6* 2.7*   CBC:  Recent Labs Lab 07/03/16 0520 07/04/16 0457 07/05/16 0543 07/06/16 0619  WBC 12.5* 7.0 5.7 5.0  NEUTROABS 11.5*  --   --   --   HGB 12.2 10.9* 10.7* 10.6*  HCT 37.1 33.1* 33.3* 32.7*  MCV 98.9 98.5 99.1 99.1  PLT 205 183 188 198   Urinalysis    Component Value Date/Time   COLORURINE YELLOW 07/03/2016 0600   APPEARANCEUR HAZY (A) 07/03/2016 0600   LABSPEC 1.011 07/03/2016 0600   PHURINE 5.0 07/03/2016 0600   GLUCOSEU NEGATIVE 07/03/2016 0600   HGBUR NEGATIVE 07/03/2016 0600   BILIRUBINUR NEGATIVE 07/03/2016 0600   KETONESUR NEGATIVE 07/03/2016 0600   PROTEINUR NEGATIVE 07/03/2016 0600   UROBILINOGEN 0.2 07/01/2016 1556   NITRITE NEGATIVE  07/03/2016 0600   LEUKOCYTESUR SMALL (A) 07/03/2016 0600    Microbiology Recent Results (from the past 240 hour(s))  Urine culture     Status: Abnormal   Collection Time: 07/01/16  4:13 PM  Result Value Ref Range Status   Specimen Description URINE, CLEAN CATCH  Final   Special Requests NONE  Final   Culture <10,000 COLONIES/mL INSIGNIFICANT GROWTH (A)  Final   Report Status 07/02/2016 FINAL  Final  Culture, blood (Routine x 2)     Status: None (Preliminary result)   Collection Time: 07/03/16  5:20 AM  Result Value Ref Range Status   Specimen Description BLOOD LEFT HAND  Final   Special Requests IN PEDIATRIC BOTTLE 4ML  Final   Culture NO GROWTH 2 DAYS  Final   Report Status PENDING  Incomplete  Culture, blood (Routine x 2)     Status: None (Preliminary result)   Collection Time: 07/03/16  5:20 AM  Result Value Ref Range Status   Specimen Description BLOOD RIGHT ARM  Final   Special Requests BOTTLES DRAWN AEROBIC AND ANAEROBIC 5ML  Final   Culture NO GROWTH 2 DAYS  Final   Report Status PENDING  Incomplete  Respiratory Panel by PCR     Status: None   Collection Time: 07/03/16  5:48 AM  Result Value Ref Range Status   Adenovirus NOT DETECTED NOT DETECTED Final   Coronavirus 229E NOT DETECTED NOT DETECTED Final   Coronavirus HKU1 NOT DETECTED NOT DETECTED Final   Coronavirus NL63 NOT DETECTED NOT DETECTED Final   Coronavirus OC43 NOT DETECTED NOT DETECTED Final   Metapneumovirus NOT DETECTED NOT DETECTED Final   Rhinovirus / Enterovirus NOT DETECTED NOT DETECTED Final   Influenza A NOT DETECTED NOT DETECTED Final   Influenza B NOT DETECTED NOT DETECTED Final   Parainfluenza Virus 1 NOT DETECTED NOT DETECTED Final   Parainfluenza Virus 2 NOT DETECTED NOT DETECTED Final   Parainfluenza Virus 3 NOT DETECTED  NOT DETECTED Final   Parainfluenza Virus 4 NOT DETECTED NOT DETECTED Final   Respiratory Syncytial Virus NOT DETECTED NOT DETECTED Final   Bordetella pertussis NOT DETECTED  NOT DETECTED Final   Chlamydophila pneumoniae NOT DETECTED NOT DETECTED Final   Mycoplasma pneumoniae NOT DETECTED NOT DETECTED Final  Urine culture     Status: Abnormal   Collection Time: 07/03/16  6:00 AM  Result Value Ref Range Status   Specimen Description URINE, RANDOM  Final   Special Requests NONE  Final   Culture 20,000 COLONIES/mL ENTEROCOCCUS FAECALIS (A)  Final   Report Status 07/06/2016 FINAL  Final   Organism ID, Bacteria ENTEROCOCCUS FAECALIS (A)  Final      Susceptibility   Enterococcus faecalis - MIC*    AMPICILLIN <=2 SENSITIVE Sensitive     LEVOFLOXACIN 1 SENSITIVE Sensitive     NITROFURANTOIN <=16 SENSITIVE Sensitive     VANCOMYCIN 1 SENSITIVE Sensitive     * 20,000 COLONIES/mL ENTEROCOCCUS FAECALIS  MRSA PCR Screening     Status: None   Collection Time: 07/04/16  4:22 PM  Result Value Ref Range Status   MRSA by PCR NEGATIVE NEGATIVE Final    Comment:        The GeneXpert MRSA Assay (FDA approved for NASAL specimens only), is one component of a comprehensive MRSA colonization surveillance program. It is not intended to diagnose MRSA infection nor to guide or monitor treatment for MRSA infections.     Time coordinating discharge: Approximately 40 minutes  Vance Gather, MD  [facility] 07/06/2016, 3:44 PM Pager [phone]

## 2016-07-06 NOTE — Care Management Important Message (Signed)
Important Message  Patient Details  Name: Laura Zuniga MRN: [id number] Date of Birth: [birth year]   Medicare Important Message Given:  Yes    [name] 07/06/2016, 1:54 PM

## 2016-07-06 NOTE — Progress Notes (Signed)
SATURATION QUALIFICATIONS: (This note is used to comply with regulatory documentation for home oxygen)  Patient Saturations on Room Air at Rest = 94%  Patient Saturations on Room Air while Ambulating = 87%  Patient Saturations on 2 Liters of oxygen while Ambulating = 99%  Please briefly explain why patient needs home oxygen: On room air while sitting, she doesn't need o2 but while walking her oxygen level drops.

## 2016-07-07 DIAGNOSIS — I480 Paroxysmal atrial fibrillation: Secondary | ICD-10-CM | POA: Diagnosis not present

## 2016-07-07 DIAGNOSIS — I5032 Chronic diastolic (congestive) heart failure: Secondary | ICD-10-CM | POA: Diagnosis not present

## 2016-07-08 LAB — CULTURE, BLOOD (ROUTINE X 2)
CULTURE: NO GROWTH
Culture: NO GROWTH

## 2016-07-10 DIAGNOSIS — I2510 Atherosclerotic heart disease of native coronary artery without angina pectoris: Secondary | ICD-10-CM | POA: Diagnosis not present

## 2016-07-10 DIAGNOSIS — Z9981 Dependence on supplemental oxygen: Secondary | ICD-10-CM | POA: Diagnosis not present

## 2016-07-10 DIAGNOSIS — I480 Paroxysmal atrial fibrillation: Secondary | ICD-10-CM | POA: Diagnosis not present

## 2016-07-10 DIAGNOSIS — J449 Chronic obstructive pulmonary disease, unspecified: Secondary | ICD-10-CM | POA: Diagnosis not present

## 2016-07-10 DIAGNOSIS — I5032 Chronic diastolic (congestive) heart failure: Secondary | ICD-10-CM | POA: Diagnosis not present

## 2016-07-10 DIAGNOSIS — J849 Interstitial pulmonary disease, unspecified: Secondary | ICD-10-CM | POA: Diagnosis not present

## 2016-07-10 DIAGNOSIS — N183 Chronic kidney disease, stage 3 (moderate): Secondary | ICD-10-CM | POA: Diagnosis not present

## 2016-07-10 DIAGNOSIS — Z950 Presence of cardiac pacemaker: Secondary | ICD-10-CM | POA: Diagnosis not present

## 2016-07-10 DIAGNOSIS — N390 Urinary tract infection, site not specified: Secondary | ICD-10-CM | POA: Diagnosis not present

## 2016-07-10 DIAGNOSIS — I130 Hypertensive heart and chronic kidney disease with heart failure and stage 1 through stage 4 chronic kidney disease, or unspecified chronic kidney disease: Secondary | ICD-10-CM | POA: Diagnosis not present

## 2016-07-10 DIAGNOSIS — I272 Pulmonary hypertension, unspecified: Secondary | ICD-10-CM | POA: Diagnosis not present

## 2016-07-17 DIAGNOSIS — R945 Abnormal results of liver function studies: Secondary | ICD-10-CM | POA: Diagnosis not present

## 2016-07-17 DIAGNOSIS — I509 Heart failure, unspecified: Secondary | ICD-10-CM | POA: Diagnosis not present

## 2016-07-17 DIAGNOSIS — N183 Chronic kidney disease, stage 3 (moderate): Secondary | ICD-10-CM | POA: Diagnosis not present

## 2016-07-17 DIAGNOSIS — N390 Urinary tract infection, site not specified: Secondary | ICD-10-CM | POA: Diagnosis not present

## 2016-07-17 DIAGNOSIS — J849 Interstitial pulmonary disease, unspecified: Secondary | ICD-10-CM | POA: Diagnosis not present

## 2016-07-26 DIAGNOSIS — N390 Urinary tract infection, site not specified: Secondary | ICD-10-CM | POA: Diagnosis not present

## 2016-07-26 DIAGNOSIS — J449 Chronic obstructive pulmonary disease, unspecified: Secondary | ICD-10-CM | POA: Diagnosis not present

## 2016-07-26 DIAGNOSIS — I480 Paroxysmal atrial fibrillation: Secondary | ICD-10-CM | POA: Diagnosis not present

## 2016-07-26 DIAGNOSIS — I2510 Atherosclerotic heart disease of native coronary artery without angina pectoris: Secondary | ICD-10-CM | POA: Diagnosis not present

## 2016-07-26 DIAGNOSIS — I5032 Chronic diastolic (congestive) heart failure: Secondary | ICD-10-CM | POA: Diagnosis not present

## 2016-07-26 DIAGNOSIS — Z950 Presence of cardiac pacemaker: Secondary | ICD-10-CM | POA: Diagnosis not present

## 2016-07-26 DIAGNOSIS — J849 Interstitial pulmonary disease, unspecified: Secondary | ICD-10-CM | POA: Diagnosis not present

## 2016-07-26 DIAGNOSIS — Z9981 Dependence on supplemental oxygen: Secondary | ICD-10-CM | POA: Diagnosis not present

## 2016-07-26 DIAGNOSIS — N183 Chronic kidney disease, stage 3 (moderate): Secondary | ICD-10-CM | POA: Diagnosis not present

## 2016-07-26 DIAGNOSIS — I272 Pulmonary hypertension, unspecified: Secondary | ICD-10-CM | POA: Diagnosis not present

## 2016-07-26 DIAGNOSIS — I130 Hypertensive heart and chronic kidney disease with heart failure and stage 1 through stage 4 chronic kidney disease, or unspecified chronic kidney disease: Secondary | ICD-10-CM | POA: Diagnosis not present

## 2016-08-02 DIAGNOSIS — I272 Pulmonary hypertension, unspecified: Secondary | ICD-10-CM | POA: Diagnosis not present

## 2016-08-02 DIAGNOSIS — J449 Chronic obstructive pulmonary disease, unspecified: Secondary | ICD-10-CM | POA: Diagnosis not present

## 2016-08-02 DIAGNOSIS — J849 Interstitial pulmonary disease, unspecified: Secondary | ICD-10-CM | POA: Diagnosis not present

## 2016-08-02 DIAGNOSIS — I480 Paroxysmal atrial fibrillation: Secondary | ICD-10-CM | POA: Diagnosis not present

## 2016-08-02 DIAGNOSIS — I5032 Chronic diastolic (congestive) heart failure: Secondary | ICD-10-CM | POA: Diagnosis not present

## 2016-08-02 DIAGNOSIS — Z9981 Dependence on supplemental oxygen: Secondary | ICD-10-CM | POA: Diagnosis not present

## 2016-08-02 DIAGNOSIS — I130 Hypertensive heart and chronic kidney disease with heart failure and stage 1 through stage 4 chronic kidney disease, or unspecified chronic kidney disease: Secondary | ICD-10-CM | POA: Diagnosis not present

## 2016-08-02 DIAGNOSIS — N390 Urinary tract infection, site not specified: Secondary | ICD-10-CM | POA: Diagnosis not present

## 2016-08-02 DIAGNOSIS — I2510 Atherosclerotic heart disease of native coronary artery without angina pectoris: Secondary | ICD-10-CM | POA: Diagnosis not present

## 2016-08-02 DIAGNOSIS — N183 Chronic kidney disease, stage 3 (moderate): Secondary | ICD-10-CM | POA: Diagnosis not present

## 2016-08-02 DIAGNOSIS — Z950 Presence of cardiac pacemaker: Secondary | ICD-10-CM | POA: Diagnosis not present

## 2016-08-06 DIAGNOSIS — I480 Paroxysmal atrial fibrillation: Secondary | ICD-10-CM | POA: Diagnosis not present

## 2016-08-06 DIAGNOSIS — I5032 Chronic diastolic (congestive) heart failure: Secondary | ICD-10-CM | POA: Diagnosis not present

## 2016-08-24 ENCOUNTER — Encounter: Payer: Self-pay | Admitting: *Deleted

## 2016-09-06 DIAGNOSIS — I480 Paroxysmal atrial fibrillation: Secondary | ICD-10-CM | POA: Diagnosis not present

## 2016-09-06 DIAGNOSIS — I5032 Chronic diastolic (congestive) heart failure: Secondary | ICD-10-CM | POA: Diagnosis not present

## 2016-09-15 ENCOUNTER — Other Ambulatory Visit: Payer: Self-pay | Admitting: Nurse Practitioner

## 2016-09-17 ENCOUNTER — Other Ambulatory Visit: Payer: Self-pay | Admitting: *Deleted

## 2016-09-17 MED ORDER — AMIODARONE HCL 100 MG PO TABS: 100.0000 mg | ORAL_TABLET | Freq: Every day | ORAL | 0 refills | Status: DC

## 2016-10-06 DIAGNOSIS — I480 Paroxysmal atrial fibrillation: Secondary | ICD-10-CM | POA: Diagnosis not present

## 2016-10-06 DIAGNOSIS — I5032 Chronic diastolic (congestive) heart failure: Secondary | ICD-10-CM | POA: Diagnosis not present

## 2016-10-15 ENCOUNTER — Other Ambulatory Visit: Payer: Self-pay | Admitting: Nurse Practitioner

## 2016-10-15 ENCOUNTER — Other Ambulatory Visit: Payer: Self-pay | Admitting: Interventional Cardiology

## 2016-10-15 NOTE — Telephone Encounter (Signed)
Can this be refilled under Dr Irish Lack? Looks like patient was started on this by Dr Emi Holes, but patient is overdue with with Dr Lovena Le but recently saw Dr Irish Lack. Please advise. Thanks, MI

## 2016-10-17 DIAGNOSIS — J849 Interstitial pulmonary disease, unspecified: Secondary | ICD-10-CM | POA: Diagnosis not present

## 2016-10-17 DIAGNOSIS — M549 Dorsalgia, unspecified: Secondary | ICD-10-CM | POA: Diagnosis not present

## 2016-10-17 DIAGNOSIS — N183 Chronic kidney disease, stage 3 (moderate): Secondary | ICD-10-CM | POA: Diagnosis not present

## 2016-10-17 DIAGNOSIS — F324 Major depressive disorder, single episode, in partial remission: Secondary | ICD-10-CM | POA: Diagnosis not present

## 2016-10-17 DIAGNOSIS — E7800 Pure hypercholesterolemia, unspecified: Secondary | ICD-10-CM | POA: Diagnosis not present

## 2016-10-17 DIAGNOSIS — I10 Essential (primary) hypertension: Secondary | ICD-10-CM | POA: Diagnosis not present

## 2016-10-17 DIAGNOSIS — I5032 Chronic diastolic (congestive) heart failure: Secondary | ICD-10-CM | POA: Diagnosis not present

## 2016-10-17 DIAGNOSIS — E039 Hypothyroidism, unspecified: Secondary | ICD-10-CM | POA: Diagnosis not present

## 2016-10-17 DIAGNOSIS — I480 Paroxysmal atrial fibrillation: Secondary | ICD-10-CM | POA: Diagnosis not present

## 2016-11-06 DIAGNOSIS — I5032 Chronic diastolic (congestive) heart failure: Secondary | ICD-10-CM | POA: Diagnosis not present

## 2016-11-06 DIAGNOSIS — I480 Paroxysmal atrial fibrillation: Secondary | ICD-10-CM | POA: Diagnosis not present

## 2016-11-10 ENCOUNTER — Other Ambulatory Visit: Payer: Self-pay | Admitting: Interventional Cardiology

## 2016-12-04 ENCOUNTER — Ambulatory Visit (INDEPENDENT_AMBULATORY_CARE_PROVIDER_SITE_OTHER): Payer: PPO | Admitting: Internal Medicine

## 2016-12-04 ENCOUNTER — Encounter: Payer: Self-pay | Admitting: Internal Medicine

## 2016-12-04 VITALS — BP 110/52 | HR 63 | Ht 59.0 in | Wt 150.8 lb

## 2016-12-04 DIAGNOSIS — I480 Paroxysmal atrial fibrillation: Secondary | ICD-10-CM | POA: Diagnosis not present

## 2016-12-04 DIAGNOSIS — I495 Sick sinus syndrome: Secondary | ICD-10-CM

## 2016-12-04 LAB — CUP PACEART INCLINIC DEVICE CHECK
Battery Voltage: 2.99 V
Brady Statistic RA Percent Paced: 98 %
Implantable Lead Implant Date: 20170207
Implantable Lead Location: 753860
Lead Channel Impedance Value: 362.5 Ohm
Lead Channel Impedance Value: 375 Ohm
Lead Channel Pacing Threshold Amplitude: 0.75 V
Lead Channel Pacing Threshold Amplitude: 0.75 V
Lead Channel Pacing Threshold Amplitude: 1.25 V
Lead Channel Pacing Threshold Pulse Width: 0.5 ms
Lead Channel Sensing Intrinsic Amplitude: 5 mV
Lead Channel Setting Pacing Amplitude: 2.125
Lead Channel Setting Pacing Pulse Width: 0.5 ms
Lead Channel Setting Sensing Sensitivity: 2 mV
MDC IDC LEAD IMPLANT DT: 20170207
MDC IDC LEAD LOCATION: 753859
MDC IDC MSMT BATTERY REMAINING LONGEVITY: 109 mo
MDC IDC MSMT LEADCHNL RA PACING THRESHOLD AMPLITUDE: 1.25 V
MDC IDC MSMT LEADCHNL RA PACING THRESHOLD PULSEWIDTH: 0.5 ms
MDC IDC MSMT LEADCHNL RA SENSING INTR AMPL: 2.5 mV
MDC IDC MSMT LEADCHNL RV PACING THRESHOLD PULSEWIDTH: 0.5 ms
MDC IDC MSMT LEADCHNL RV PACING THRESHOLD PULSEWIDTH: 0.5 ms
MDC IDC PG IMPLANT DT: 20170207
MDC IDC SESS DTM: 20180828154127
MDC IDC SET LEADCHNL RV PACING AMPLITUDE: 2.5 V
MDC IDC STAT BRADY RV PERCENT PACED: 6.7 %
Pulse Gen Model: 2240
Pulse Gen Serial Number: 7866856

## 2016-12-04 NOTE — Progress Notes (Signed)
HPI [name] returns today for ongoing evaluation of sinus node dysfunction status post permanent pacemaker insertion. She is a very pleasant 83 year old woman with a history of symptomatic bradycardia, status post permanent pacemaker insertion. She has chronic dyspnea and lower extremity swelling due to venous insufficiency. She complains of some diarrhea. Allergies  Allergen Reactions  . Penicillins Other (See Comments)    Unknown allergic reaction  . 2,4-D Dimethylamine (Amisol) Other (See Comments)  . Ace Inhibitors Other (See Comments)  . Lanolin Hives  . Vancomycin     Red man syndrome  . Sulfonamide Derivatives Swelling and Rash     Current Outpatient Prescriptions  Medication Sig Dispense Refill  . acetaminophen (TYLENOL) 500 MG tablet Take 2 tablets (1,000 mg total) by mouth every 6 (six) hours as needed for mild pain or moderate pain. 30 tablet 0  . albuterol (PROVENTIL HFA;VENTOLIN HFA) 108 (90 BASE) MCG/ACT inhaler Inhale 2 puffs into the lungs every 6 (six) hours as needed for wheezing or shortness of breath. 1 Inhaler 3  . Biotin 1 MG CAPS Take 1 mg by mouth daily.    . clopidogrel (PLAVIX) 75 MG tablet TAKE 75 MG BY MOUTH ONCE DAILY 30 tablet 3  . Cranberry 250 MG CAPS Take 250 mg by mouth daily after lunch.     . docusate sodium (COLACE) 100 MG capsule Take 100 mg by mouth daily as needed for mild constipation.    Marland Kitchen escitalopram (LEXAPRO) 10 MG tablet Take 10 mg by mouth daily.     . ferrous sulfate 325 (65 FE) MG tablet Take 325 mg by mouth daily after lunch.     . furosemide (LASIX) 80 MG tablet Take 1 tablet by mouth in the morning, 1/2 tablet (40 mg)  in the evening and can take 1/2 tablet daily as needed for swelling    . hydrALAZINE (APRESOLINE) 25 MG tablet TAKE ONE TABLET BY MOUTH TWICE DAILY 180 tablet 1  . isosorbide mononitrate (IMDUR) 60 MG 24 hr tablet TAKE ONE TABLET BY MOUTH ONCE DAILY 90 tablet 3  . levothyroxine (SYNTHROID, LEVOTHROID) 50 MCG  tablet Take 50 mcg by mouth daily before breakfast.     . Multiple Vitamins-Minerals (ICAPS AREDS 2 PO) Take 1 tablet by mouth daily.    Marland Kitchen PACERONE 100 MG tablet TAKE 1 TABLET BY MOUTH ONCE DAILY *NEEDS  APPOINTMENT  WITH  DR.  Lovena Zuniga* 90 tablet 0  . pantoprazole (PROTONIX) 40 MG tablet Take 40 mg by mouth daily after lunch.     . potassium chloride SA (K-DUR,KLOR-CON) 20 MEQ tablet Take 20 mEq by mouth 2 (two) times daily.     . vitamin C (ASCORBIC ACID) 500 MG tablet Take 500 mg by mouth daily after lunch.     . vitamin E 400 UNIT capsule Take 400 Units by mouth daily after lunch.     No current facility-administered medications for this visit.      Past Medical History:  Diagnosis Date  . Bradycardia    a. Amiodarone DC'd 12/16 >> b. Holter 1/17 - NSR, sinus brady, junctional rhythm, pause 2.4 s  . C. difficile colitis 2015  . CAD (coronary artery disease)    never had cardiac cath, CAD dx by positive ant. wall defect on Nuc. study 2007 >>  no cath due to high risk/advanced age  . Chronic diastolic CHF (congestive heart failure) ([place])    a. Echo 6/15 - Mild LVH, EF 55-60%, mild AI,  mild MR, moderate LAE, mild RAE, PASP 52 mm Hg  . CKD (chronic kidney disease)   . COPD (chronic obstructive pulmonary disease) ([place])   . Diverticulitis   . GERD (gastroesophageal reflux disease)   . Hypertension   . Macular degeneration   . PAF (paroxysmal atrial fibrillation) (H[facility])    not optimal candidate for anticoagulation    ROS:   All systems reviewed and negative except as noted in the HPI.   Past Surgical History:  Procedure Laterality Date  . APPENDECTOMY    . CHOLECYSTECTOMY    . EP IMPLANTABLE DEVICE N/A 05/17/2015   Procedure: Pacemaker Implant;  Surgeon: Laura Lance, MD;  Location: [facility];  Service: Cardiovascular;  Laterality: N/A;  . ESOPHAGOGASTRODUODENOSCOPY N/A 09/22/2013   Procedure: ESOPHAGOGASTRODUODENOSCOPY (EGD);  Surgeon: Laura Ng, MD;  Location:  [facility] ENDOSCOPY;  Service: Endoscopy;  Laterality: N/A;  . ESOPHAGOGASTRODUODENOSCOPY N/A 09/22/2013   Procedure: ESOPHAGOGASTRODUODENOSCOPY (EGD);  Surgeon: Laura Ng, MD;  Location: [facility] ENDOSCOPY;  Service: Endoscopy;  Laterality: N/A;  . EYE SURGERY    . FLEXIBLE SIGMOIDOSCOPY N/A 09/22/2013   Procedure: FLEXIBLE SIGMOIDOSCOPY;  Surgeon: Laura Ng, MD;  Location: [facility] ENDOSCOPY;  Service: Endoscopy;  Laterality: N/A;  . FLEXIBLE SIGMOIDOSCOPY N/A 09/22/2013   Procedure: FLEXIBLE SIGMOIDOSCOPY;  Surgeon: Laura Ng, MD;  Location: [facility] ENDOSCOPY;  Service: Endoscopy;  Laterality: N/A;  unprepped/ egd first  . VAGINAL HYSTERECTOMY       Family History  Problem Relation Age of Onset  . Heart attack Father 11  . Heart attack Sister   . Cancer Brother   . Cancer Brother   . Cirrhosis Brother   . Heart disease Brother   . Cancer Brother   . COPD Brother   . COPD Sister   . Cancer Sister   . Cancer Sister   . Hypertension Daughter   . Stroke Neg Hx      Social History   Social History  . Marital status: Widowed    Spouse name: N/A  . Number of children: N/A  . Years of education: N/A   Occupational History  . Not on file.   Social History Main Topics  . Smoking status: Never Smoker  . Smokeless tobacco: Never Used  . Alcohol use No  . Drug use: No  . Sexual activity: No   Other Topics Concern  . Not on file   Social History Narrative        father died age 43: Massive MI         mother died age 77: gallbladder surgery complications         4 brothers: all deceased; prostate ca, throat ca, emphesema, MI, cirrhosis from ETOH         3 sisters: one living: MI, emphesema, 2 sisters with breast CA     BP (!) 110/52   Pulse 63   Ht 4\' 11"  (1.499 m)   Wt 150 lb 12.8 oz (68.4 kg)   SpO2 93%   BMI 30.46 kg/m   Physical Exam:  Well appearingElderly woman, NAD HEENT: Unremarkable Neck:  6 cm JVD, no thyromegally Lymphatics:  No  adenopathy Back:  No CVA tenderness Lungs:  ClearWith no wheezes, rales, or rhonchi HEART:  Regular rate rhythm, no murmurs, no rubs, no clicks Abd:  soft, positive bowel sounds, no organomegally, no rebound, no guarding Ext:  2 plus pulses, 1+ peripheral edema, no cyanosis, no clubbing Skin:  No rashes  no nodules Neuro:  CN II through XII intact, motor grossly intact   DEVICE  Normal device function.  See PaceArt for details.   Assess/Plan: 1. Sinus node dysfunction - she is doing well status post permanent pacemaker insertion. 2. Pacemaker - her dual-chamber [employer] pacemaker is working normally. She is pacing 98% in the atrium. She has brief episodes of atrial fibrillation, lasting up to 15 minutes. This is not long enough to recommend systemic anticoagulation. She will undergo watchful waiting. 3. Paroxysmal atrial fibrillation - she is asymptomatic. See note above. Her episodes are self-limited and brief. We will watch to see that she does not have progressively longer episodes of atrial fibrillation. We would consider systemic anticoagulation if her episodes of atrial fibrillation were the last more than an hour at a time. 4. Venous insufficiency - she has hyperpigmentation of her lower extremities as well as 1+ peripheral edema. I discussed the mechanism of venous insufficiency with the patient and her daughter. I've encouraged him to keep her legs wrapped or with tight pressure stockings. Keeping her legs elevated and a low-sodium diet were also discussed.  Cristopher Peru, M.D.

## 2016-12-04 NOTE — Patient Instructions (Signed)
Medication Instructions:  Your physician recommends that you continue on your current medications as directed. Please refer to the Current Medication list given to you today.  Labwork: None ordered.  Testing/Procedures: None ordered.  Follow-Up: Your physician wants you to follow-up in: one year with Dr. Lovena Le.  You will receive a reminder letter in the mail two months in advance. If you don't receive a letter, please call our office to schedule the follow-up appointment.  Remote monitoring is used to monitor your Pacemaker from home. This monitoring reduces the number of office visits required to check your device to one time per year. It allows [place] to keep an eye on the functioning of your device to ensure it is working properly. You are scheduled for a device check from home on 03/05/2017. You may send your transmission at any time that day. If you have a wireless device, the transmission will be sent automatically. After your physician reviews your transmission, you will receive a postcard with your next transmission date.    Any Other Special Instructions Will Be Listed Below (If Applicable).     If you need a refill on your cardiac medications before your next appointment, please call your pharmacy.

## 2016-12-07 DIAGNOSIS — I5032 Chronic diastolic (congestive) heart failure: Secondary | ICD-10-CM | POA: Diagnosis not present

## 2016-12-07 DIAGNOSIS — I480 Paroxysmal atrial fibrillation: Secondary | ICD-10-CM | POA: Diagnosis not present

## 2016-12-07 DIAGNOSIS — D649 Anemia, unspecified: Secondary | ICD-10-CM | POA: Diagnosis not present

## 2016-12-07 DIAGNOSIS — K5900 Constipation, unspecified: Secondary | ICD-10-CM | POA: Diagnosis not present

## 2016-12-07 DIAGNOSIS — K219 Gastro-esophageal reflux disease without esophagitis: Secondary | ICD-10-CM | POA: Diagnosis not present

## 2016-12-07 DIAGNOSIS — I10 Essential (primary) hypertension: Secondary | ICD-10-CM | POA: Diagnosis not present

## 2016-12-12 DIAGNOSIS — L570 Actinic keratosis: Secondary | ICD-10-CM | POA: Diagnosis not present

## 2016-12-12 DIAGNOSIS — X32XXXD Exposure to sunlight, subsequent encounter: Secondary | ICD-10-CM | POA: Diagnosis not present

## 2017-01-06 DIAGNOSIS — I480 Paroxysmal atrial fibrillation: Secondary | ICD-10-CM | POA: Diagnosis not present

## 2017-01-06 DIAGNOSIS — I5032 Chronic diastolic (congestive) heart failure: Secondary | ICD-10-CM | POA: Diagnosis not present

## 2017-01-21 DIAGNOSIS — E039 Hypothyroidism, unspecified: Secondary | ICD-10-CM | POA: Diagnosis not present

## 2017-01-21 DIAGNOSIS — J849 Interstitial pulmonary disease, unspecified: Secondary | ICD-10-CM | POA: Diagnosis not present

## 2017-01-21 DIAGNOSIS — E7800 Pure hypercholesterolemia, unspecified: Secondary | ICD-10-CM | POA: Diagnosis not present

## 2017-01-21 DIAGNOSIS — K219 Gastro-esophageal reflux disease without esophagitis: Secondary | ICD-10-CM | POA: Diagnosis not present

## 2017-01-21 DIAGNOSIS — N183 Chronic kidney disease, stage 3 (moderate): Secondary | ICD-10-CM | POA: Diagnosis not present

## 2017-01-21 DIAGNOSIS — I2510 Atherosclerotic heart disease of native coronary artery without angina pectoris: Secondary | ICD-10-CM | POA: Diagnosis not present

## 2017-01-21 DIAGNOSIS — I10 Essential (primary) hypertension: Secondary | ICD-10-CM | POA: Diagnosis not present

## 2017-01-21 DIAGNOSIS — Z23 Encounter for immunization: Secondary | ICD-10-CM | POA: Diagnosis not present

## 2017-01-21 DIAGNOSIS — I480 Paroxysmal atrial fibrillation: Secondary | ICD-10-CM | POA: Diagnosis not present

## 2017-01-21 DIAGNOSIS — I5032 Chronic diastolic (congestive) heart failure: Secondary | ICD-10-CM | POA: Diagnosis not present

## 2017-01-21 DIAGNOSIS — F324 Major depressive disorder, single episode, in partial remission: Secondary | ICD-10-CM | POA: Diagnosis not present

## 2017-02-06 DIAGNOSIS — I480 Paroxysmal atrial fibrillation: Secondary | ICD-10-CM | POA: Diagnosis not present

## 2017-02-06 DIAGNOSIS — I5032 Chronic diastolic (congestive) heart failure: Secondary | ICD-10-CM | POA: Diagnosis not present

## 2017-02-13 ENCOUNTER — Other Ambulatory Visit: Payer: Self-pay | Admitting: Interventional Cardiology

## 2017-02-15 ENCOUNTER — Ambulatory Visit: Payer: PPO | Admitting: Interventional Cardiology

## 2017-02-15 ENCOUNTER — Encounter: Payer: Self-pay | Admitting: Interventional Cardiology

## 2017-02-15 VITALS — BP 130/58 | HR 63 | Ht 59.0 in | Wt 149.8 lb

## 2017-02-15 DIAGNOSIS — I480 Paroxysmal atrial fibrillation: Secondary | ICD-10-CM

## 2017-02-15 DIAGNOSIS — Z950 Presence of cardiac pacemaker: Secondary | ICD-10-CM | POA: Diagnosis not present

## 2017-02-15 DIAGNOSIS — R9430 Abnormal result of cardiovascular function study, unspecified: Secondary | ICD-10-CM | POA: Diagnosis not present

## 2017-02-15 DIAGNOSIS — I25118 Atherosclerotic heart disease of native coronary artery with other forms of angina pectoris: Secondary | ICD-10-CM | POA: Diagnosis not present

## 2017-02-15 NOTE — Patient Instructions (Signed)

## 2017-02-15 NOTE — Progress Notes (Signed)
Cardiology Office Note   Date:  02/15/2017   ID:  Laura Zuniga, DOB [birth year], MRN [id number]  PCP:  Wenda Low, MD    No chief complaint on file. CAD   Wt Readings from Last 3 Encounters:  02/15/17 149 lb 12.8 oz (67.9 kg)  12/04/16 150 lb 12.8 oz (68.4 kg)  07/06/16 150 lb 3.2 oz (68.1 kg)       History of Present Illness: Laura Zuniga is a 83 y.o. female  with a hx of presumed CAD (anterior wall abnormality on nuclear imaging in 4431), diastolic CHF, atrial fibrillation, COPD, HTN. She spends part of her year in [place] and part in [place]. Patient was admitted 6/15 with C. Difficile colitis complicated by atrial fibrillation with RVR. She was placed on amiodarone. She was not felt to be a good candidate for anticoagulation. She converted to NSR. She was then readmitted 6/16 with acute on chronic diastolic CHF. She was diuresed with IV Lasix and d/c back to SNF. FU Holter in 8/15 with NSR, occ junctional rhythm, lowest HR 48; No AFib.   Seen in 12/16 to evaluate bradycardia. She was back in AF with SVR (HR in 40s). She did have orthostatic intol but Ortho VS were ok. Case was reviewed with Dr. Virl Axe and she was taken off of Amiodarone. Holter monitor was placed NSR, sinus brady, junctional rhythm and longest pause 2.4 seconds.   She subsequently had symptomatic bradycardia with a pacer placed.   Follows with pulmonary for COPD.  She feels tired some days.    Denies : Chest pain. Dizziness. Leg edema. Nitroglycerin use. Orthopnea. Palpitations. Paroxysmal nocturnal dyspnea. Syncope.  Uses supplemental oxygen prn, typically with exertion.  Overall, doing well    Past Medical History:  Diagnosis Date  . Bradycardia    a. Amiodarone DC'd 12/16 >> b. Holter 1/17 - NSR, sinus brady, junctional rhythm, pause 2.4 s  . C. difficile colitis 2015  . CAD (coronary artery disease)    never had cardiac cath, CAD dx by positive ant. wall defect on Nuc.  study 2007 >>  no cath due to high risk/advanced age  . Chronic diastolic CHF (congestive heart failure) ([place])    a. Echo 6/15 - Mild LVH, EF 55-60%, mild AI, mild MR, moderate LAE, mild RAE, PASP 52 mm Hg  . CKD (chronic kidney disease)   . COPD (chronic obstructive pulmonary disease) ([place])   . Diverticulitis   . GERD (gastroesophageal reflux disease)   . Hypertension   . Macular degeneration   . PAF (paroxysmal atrial fibrillation) (H[facility])    not optimal candidate for anticoagulation    Past Surgical History:  Procedure Laterality Date  . APPENDECTOMY    . CHOLECYSTECTOMY    . EYE SURGERY    . VAGINAL HYSTERECTOMY       Current Outpatient Medications  Medication Sig Dispense Refill  . acetaminophen (TYLENOL) 500 MG tablet Take 2 tablets (1,000 mg total) by mouth every 6 (six) hours as needed for mild pain or moderate pain. 30 tablet 0  . albuterol (PROVENTIL HFA;VENTOLIN HFA) 108 (90 BASE) MCG/ACT inhaler Inhale 2 puffs into the lungs every 6 (six) hours as needed for wheezing or shortness of breath. 1 Inhaler 3  . Biotin 1 MG CAPS Take 1 mg by mouth daily.    . clopidogrel (PLAVIX) 75 MG tablet TAKE 75 MG BY MOUTH ONCE DAILY 30 tablet 3  . Cranberry 250 MG CAPS Take 250 mg  by mouth daily after lunch.     . docusate sodium (COLACE) 100 MG capsule Take 100 mg by mouth daily as needed for mild constipation.    Marland Kitchen escitalopram (LEXAPRO) 10 MG tablet Take 15 mg daily by mouth.     . ferrous sulfate 325 (65 FE) MG tablet Take 325 mg by mouth daily after lunch.     . furosemide (LASIX) 80 MG tablet Take 1 tablet by mouth in the morning, 1/2 tablet (40 mg)  in the evening and can take 1/2 tablet daily as needed for swelling    . hydrALAZINE (APRESOLINE) 25 MG tablet TAKE ONE TABLET BY MOUTH TWICE DAILY 180 tablet 1  . isosorbide mononitrate (IMDUR) 60 MG 24 hr tablet TAKE ONE TABLET BY MOUTH ONCE DAILY 90 tablet 3  . levothyroxine (SYNTHROID, LEVOTHROID) 50 MCG tablet Take 50 mcg by mouth  daily before breakfast.     . Multiple Vitamins-Minerals (ICAPS AREDS 2 PO) Take 1 tablet by mouth daily.    Marland Kitchen PACERONE 100 MG tablet TAKE 1 TABLET BY MOUTH ONCE DAILY **NEEDS APPOINTMENT WITH DR. Lovena Le** 90 tablet 2  . pantoprazole (PROTONIX) 40 MG tablet Take 40 mg by mouth daily after lunch.     . potassium chloride SA (K-DUR,KLOR-CON) 20 MEQ tablet Take 20 mEq by mouth 2 (two) times daily.     . pravastatin (PRAVACHOL) 40 MG tablet Take 40 mg daily by mouth.    . ranitidine (ZANTAC) 150 MG capsule Take 150 mg daily by mouth.    . vitamin C (ASCORBIC ACID) 500 MG tablet Take 500 mg by mouth daily after lunch.     . vitamin E 400 UNIT capsule Take 400 Units by mouth daily after lunch.     No current facility-administered medications for this visit.     Allergies:   Penicillins; 2,4-d dimethylamine (amisol); Ace inhibitors; Lanolin; Vancomycin; and Sulfonamide derivatives    Social History:  The patient  reports that  has never smoked. she has never used smokeless tobacco. She reports that she does not drink alcohol or use drugs.   Family History:  The patient's family history includes COPD in her brother and sister; Cancer in her brother, brother, brother, sister, and sister; Cirrhosis in her brother; Heart attack in her sister; Heart attack (age of onset: 35) in her father; Heart disease in her brother; Hypertension in her daughter.    ROS:  Please see the history of present illness.   Otherwise, review of systems are positive for occasional fatigue.   All other systems are reviewed and negative.    PHYSICAL EXAM: VS:  BP (!) 130/58   Pulse 63   Ht 4\' 11"  (1.499 m)   Wt 149 lb 12.8 oz (67.9 kg)   SpO2 95%   BMI 30.26 kg/m  , BMI Body mass index is 30.26 kg/m. GEN: Well nourished, well developed, in no acute distress  HEENT: normal  Neck: no JVD, carotid bruits, or masses Cardiac: RRR; no murmurs, rubs, or gallops,no edema  Respiratory:  clear to auscultation bilaterally,  normal work of breathing GI: soft, nontender, nondistended, + BS MS: no deformity or atrophy  Skin: warm and dry, no rash Neuro:  Strength and sensation are intact Psych: euthymic mood, full affect   EKG:   The ekg ordered in 3/18 demonstrates atrial fibrillation   Recent Labs: 07/03/2016: B Natriuretic Peptide 401.6; Magnesium 1.7; TSH 1.580 07/06/2016: ALT 88; BUN 10; Creatinine, Ser 0.97; Hemoglobin 10.6; Platelets 198; Potassium  4.0; Sodium 139   Lipid Panel    Component Value Date/Time   CHOL 143 06/28/2011 2230   TRIG 177 (H) 06/28/2011 2230   HDL 38 (L) 06/28/2011 2230   CHOLHDL 3.8 06/28/2011 2230   VLDL 35 06/28/2011 2230   LDLCALC 70 06/28/2011 2230     Other studies Reviewed: Additional studies/ records that were reviewed today with results demonstrating: lipids reviewed, LDL 86 in 10/18.   ASSESSMENT AND PLAN:  1. CAD: Abnormal stress test in the past. No significant angina.  Continue current medical therapy.   2. Hyperlipidemia: Continue aggressive secondary prevention with pravastatin.  The daughter states she is sometimes holding the statin for her mom.  Labs are controlled. 3. Hypertension: Blood pressure well controlled.  Continue current medicines. 4. COPD flare diagnosed in the ER.  She has inhalers that she can use. 5. Atrial fibrillation: Not thought to be a good long-term anticoagulation candidate.  She is taking Plavix for now.  Pacer in place.   Current medicines are reviewed at length with the patient today.  The patient concerns regarding her medicines were addressed.  The following changes have been made:  No change  Labs/ tests ordered today include:  No orders of the defined types were placed in this encounter.   Recommend 150 minutes/week of aerobic exercise Low fat, low carb, high fiber diet recommended  Disposition:   FU in 1 year   Signed, Larae Grooms, MD  02/15/2017 2:48 PM    [facility] [address], [place], [place]  [address] Phone: [phone]; Fax: [phone]

## 2017-03-05 ENCOUNTER — Ambulatory Visit (INDEPENDENT_AMBULATORY_CARE_PROVIDER_SITE_OTHER): Payer: PPO | Admitting: *Deleted

## 2017-03-05 DIAGNOSIS — I495 Sick sinus syndrome: Secondary | ICD-10-CM | POA: Diagnosis not present

## 2017-03-06 NOTE — Progress Notes (Signed)
Remote pacemaker transmission.   

## 2017-03-08 ENCOUNTER — Encounter: Payer: Self-pay | Admitting: Cardiology

## 2017-03-08 DIAGNOSIS — I480 Paroxysmal atrial fibrillation: Secondary | ICD-10-CM | POA: Diagnosis not present

## 2017-03-08 DIAGNOSIS — I5032 Chronic diastolic (congestive) heart failure: Secondary | ICD-10-CM | POA: Diagnosis not present

## 2017-03-18 LAB — CUP PACEART REMOTE DEVICE CHECK
Battery Remaining Longevity: 106 mo
Battery Remaining Percentage: 95.5 %
Brady Statistic AP VS Percent: 96 %
Brady Statistic AS VP Percent: 1 %
Brady Statistic AS VS Percent: 1 %
Implantable Lead Implant Date: 20170207
Implantable Lead Location: 753859
Lead Channel Impedance Value: 410 Ohm
Lead Channel Pacing Threshold Amplitude: 0.75 V
Lead Channel Pacing Threshold Pulse Width: 0.5 ms
Lead Channel Pacing Threshold Pulse Width: 0.5 ms
Lead Channel Sensing Intrinsic Amplitude: 1.6 mV
Lead Channel Sensing Intrinsic Amplitude: 5 mV
Lead Channel Setting Pacing Amplitude: 2.5 V
Lead Channel Setting Pacing Amplitude: 3.125
Lead Channel Setting Pacing Pulse Width: 0.5 ms
MDC IDC LEAD IMPLANT DT: 20170207
MDC IDC LEAD LOCATION: 753860
MDC IDC MSMT BATTERY VOLTAGE: 2.99 V
MDC IDC MSMT LEADCHNL RA PACING THRESHOLD AMPLITUDE: 1.625 V
MDC IDC MSMT LEADCHNL RV IMPEDANCE VALUE: 380 Ohm
MDC IDC PG IMPLANT DT: 20170207
MDC IDC SESS DTM: 20181127070013
MDC IDC SET LEADCHNL RV SENSING SENSITIVITY: 2 mV
MDC IDC STAT BRADY AP VP PERCENT: 3.4 %
MDC IDC STAT BRADY RA PERCENT PACED: 99 %
MDC IDC STAT BRADY RV PERCENT PACED: 3.4 %
Pulse Gen Serial Number: 7866856

## 2017-03-21 DIAGNOSIS — R410 Disorientation, unspecified: Secondary | ICD-10-CM | POA: Diagnosis not present

## 2017-03-21 DIAGNOSIS — I10 Essential (primary) hypertension: Secondary | ICD-10-CM | POA: Diagnosis not present

## 2017-04-04 ENCOUNTER — Encounter (HOSPITAL_COMMUNITY): Payer: Self-pay | Admitting: Emergency Medicine

## 2017-04-04 ENCOUNTER — Other Ambulatory Visit: Payer: Self-pay

## 2017-04-04 ENCOUNTER — Ambulatory Visit (HOSPITAL_COMMUNITY)
Admission: EM | Admit: 2017-04-04 | Discharge: 2017-04-04 | Disposition: A | Discharge status: Home / Self Care | Payer: PPO | Attending: Internal Medicine | Admitting: Internal Medicine

## 2017-04-04 ENCOUNTER — Ambulatory Visit (INDEPENDENT_AMBULATORY_CARE_PROVIDER_SITE_OTHER): Payer: PPO

## 2017-04-04 DIAGNOSIS — M25531 Pain in right wrist: Secondary | ICD-10-CM

## 2017-04-04 MED ORDER — DICLOFENAC SODIUM 1 % TD GEL: 2.0000 g | Freq: Four times a day (QID) | TRANSDERMAL | 0 refills | Status: AC

## 2017-04-04 NOTE — ED Provider Notes (Signed)
[place]    CSN: [id number] Arrival date & time: 04/04/17  1558     History   Chief Complaint Chief Complaint  Patient presents with  . Wrist Pain    right    HPI Laura Zuniga is a 83 y.o. female presenting with right wrist/thumb pain for 5-6 days. No specific injury, daughter states she puts weight on a metal bar to get out of bed. No numbness or tingling, no radiation.   HPI  Past Medical History:  Diagnosis Date  . Bradycardia    a. Amiodarone DC'd 12/16 >> b. Holter 1/17 - NSR, sinus brady, junctional rhythm, pause 2.4 s  . C. difficile colitis 2015  . CAD (coronary artery disease)    never had cardiac cath, CAD dx by positive ant. wall defect on Nuc. study 2007 >>  no cath due to high risk/advanced age  . Chronic diastolic CHF (congestive heart failure) ([place])    a. Echo 6/15 - Mild LVH, EF 55-60%, mild AI, mild MR, moderate LAE, mild RAE, PASP 52 mm Hg  . CKD (chronic kidney disease)   . COPD (chronic obstructive pulmonary disease) ([place])   . Diverticulitis   . GERD (gastroesophageal reflux disease)   . Hypertension   . Macular degeneration   . PAF (paroxysmal atrial fibrillation) ([place])    not optimal candidate for anticoagulation    Patient Active Problem List   Diagnosis Date Noted  . Sepsis ([place]) 07/03/2016  . Abnormal urinalysis 07/03/2016  . Dysphagia (mild-tolerates regular/thin liquids w/ swallow techniques) 07/03/2016  . Pulmonary HTN ([place]) 07/03/2016  . Transaminitis 07/03/2016  . Hypothyroidism 07/03/2016  . Elevated liver function tests   . UTI (urinary tract infection) with pyuria   . ILD (interstitial lung disease) ([place]) 02/17/2016  . Chronic cough 02/17/2016  . Cardiac pacemaker 02/16/2016  . Sinus node dysfunction ([place]) 05/05/2015  . CKD (chronic kidney disease), stage III ([place]) 09/14/2014  . GERD (gastroesophageal reflux disease) 10/29/2013  . Hyponatremia 10/21/2013  . Acute blood loss anemia 10/01/2013  . Urinary  retention 10/01/2013  . Chronic diastolic heart failure ([place]) 09/28/2013  . PAF (paroxysmal atrial fibrillation) ([place]) 09/27/2013  . Melena 09/22/2013  . ATN (acute tubular necrosis) ([place]) 09/21/2013  . Nonspecific abnormal results of cardiovascular function study 06/11/2013  . Syncope 06/28/2011  . Swelling of left lower extremity 06/28/2011  . DEMENTIA, CCE, W/O BEHAVIORAL DISTURBANCE 01/17/2007  . RENAL STONE 12/04/2006  . HLD (hyperlipidemia) 07/15/2006  . DEPRESSION 07/15/2006  . BLINDNESS, LEFT EYE 07/15/2006  . Essential hypertension 07/15/2006  . CAD (coronary artery disease) 07/15/2006  . OSTEOPOROSIS 07/15/2006    Past Surgical History:  Procedure Laterality Date  . APPENDECTOMY    . CHOLECYSTECTOMY    . EP IMPLANTABLE DEVICE N/A 05/17/2015   Procedure: Pacemaker Implant;  Surgeon: Evans Lance, MD;  Location: [facility];  Service: Cardiovascular;  Laterality: N/A;  . ESOPHAGOGASTRODUODENOSCOPY N/A 09/22/2013   Procedure: ESOPHAGOGASTRODUODENOSCOPY (EGD);  Surgeon: Lear Ng, MD;  Location: [facility] ENDOSCOPY;  Service: Endoscopy;  Laterality: N/A;  . ESOPHAGOGASTRODUODENOSCOPY N/A 09/22/2013   Procedure: ESOPHAGOGASTRODUODENOSCOPY (EGD);  Surgeon: Lear Ng, MD;  Location: [facility] ENDOSCOPY;  Service: Endoscopy;  Laterality: N/A;  . EYE SURGERY    . FLEXIBLE SIGMOIDOSCOPY N/A 09/22/2013   Procedure: FLEXIBLE SIGMOIDOSCOPY;  Surgeon: Lear Ng, MD;  Location: [facility] ENDOSCOPY;  Service: Endoscopy;  Laterality: N/A;  . FLEXIBLE SIGMOIDOSCOPY N/A 09/22/2013   Procedure: FLEXIBLE SIGMOIDOSCOPY;  Surgeon: Evette Doffing  Aloha Gell, MD;  Location: [facility];  Service: Endoscopy;  Laterality: N/A;  unprepped/ egd first  . VAGINAL HYSTERECTOMY      OB History    No data available       Home Medications    Prior to Admission medications   Medication Sig Start Date End Date Taking? Authorizing Provider  acetaminophen (TYLENOL) 500 MG tablet Take 2  tablets (1,000 mg total) by mouth every 6 (six) hours as needed for mild pain or moderate pain. 05/27/16  Yes Duffy Bruce, MD  albuterol (PROVENTIL HFA;VENTOLIN HFA) 108 (90 BASE) MCG/ACT inhaler Inhale 2 puffs into the lungs every 6 (six) hours as needed for wheezing or shortness of breath. 02/23/15  Yes Deloria Lair, NP  Biotin 1 MG CAPS Take 1 mg by mouth daily.   Yes [provider]  clopidogrel (PLAVIX) 75 MG tablet TAKE 75 MG BY MOUTH ONCE DAILY 07/01/16  Yes Robyn Haber, MD  [place] 250 MG CAPS Take 250 mg by mouth daily after lunch.    Yes [provider]  docusate sodium (COLACE) 100 MG capsule Take 100 mg by mouth daily as needed for mild constipation.   Yes [provider]  escitalopram (LEXAPRO) 10 MG tablet Take 15 mg daily by mouth.    Yes [provider]  ferrous sulfate 325 (65 FE) MG tablet Take 325 mg by mouth daily after lunch.    Yes [provider]  furosemide (LASIX) 80 MG tablet Take 1 tablet by mouth in the morning, 1/2 tablet (40 mg)  in the evening and can take 1/2 tablet daily as needed for swelling   Yes [provider]  hydrALAZINE (APRESOLINE) 25 MG tablet TAKE ONE TABLET BY MOUTH TWICE DAILY 10/15/16  Yes Jettie Booze, MD  isosorbide mononitrate (IMDUR) 60 MG 24 hr tablet TAKE ONE TABLET BY MOUTH ONCE DAILY 04/10/16  Yes Jettie Booze, MD  levothyroxine (SYNTHROID, LEVOTHROID) 50 MCG tablet Take 50 mcg by mouth daily before breakfast.  06/04/14  Yes [provider]  Multiple Vitamins-Minerals (ICAPS AREDS 2 PO) Take 1 tablet by mouth daily.   Yes [provider]  PACERONE 100 MG tablet TAKE 1 TABLET BY MOUTH ONCE DAILY **NEEDS APPOINTMENT WITH DR. Lovena Le** 02/14/17  Yes Jettie Booze, MD  pantoprazole (PROTONIX) 40 MG tablet Take 40 mg by mouth daily after lunch.    Yes [provider]  potassium chloride SA (K-DUR,KLOR-CON) 20 MEQ tablet Take 20 mEq by mouth 2  (two) times daily.  05/20/14  Yes [provider]  pravastatin (PRAVACHOL) 40 MG tablet Take 40 mg daily by mouth. 01/07/17  Yes [provider]  ranitidine (ZANTAC) 150 MG capsule Take 150 mg daily by mouth. 02/06/17  Yes [provider]  vitamin C (ASCORBIC ACID) 500 MG tablet Take 500 mg by mouth daily after lunch.    Yes [provider]  vitamin E 400 UNIT capsule Take 400 Units by mouth daily after lunch.   Yes [provider]  diclofenac sodium (VOLTAREN) 1 % GEL Apply 2 g topically 4 (four) times daily for 7 days. 04/04/17 04/11/17  Kristyna Bradstreet, Elesa Hacker, PA-C    Family History Family History  Problem Relation Age of Onset  . Heart attack Father 47  . Heart attack Sister   . Cancer Brother   . Cancer Brother   . Cirrhosis Brother   . Heart disease Brother   . Cancer Brother   . COPD Brother   .  COPD Sister   . Cancer Sister   . Cancer Sister   . Hypertension Daughter   . Stroke Neg Hx     Social History Social History   Tobacco Use  . Smoking status: Never Smoker  . Smokeless tobacco: Never Used  Substance Use Topics  . Alcohol use: No    Alcohol/week: 0.0 oz  . Drug use: No     Allergies   Penicillins; 2,4-d dimethylamine (amisol); Ace inhibitors; Lanolin; Vancomycin; and Sulfonamide derivatives   Review of Systems Review of Systems  Constitutional: Negative for diaphoresis and fever.  Musculoskeletal: Positive for arthralgias. Negative for joint swelling, myalgias and neck pain.  Skin: Negative for color change and rash.  Neurological: Negative for dizziness, weakness, light-headedness, numbness and headaches.     Physical Exam Triage Vital Signs ED Triage Vitals  Enc Vitals Group     BP      Pulse      Resp      Temp      Temp src      SpO2      Weight      Height      Head Circumference      Peak Flow      Pain Score      Pain Loc      Pain Edu?      Excl. in [place]?    No data found.  Updated Vital  Signs There were no vitals taken for this visit.  Visual Acuity Right Eye Distance:   Left Eye Distance:   Bilateral Distance:    Right Eye Near:   Left Eye Near:    Bilateral Near:     Physical Exam  Constitutional: She appears well-developed and well-nourished.  HENT:  Head: Normocephalic and atraumatic.  Neck: Normal range of motion.  Cardiovascular: Normal rate.  Pulmonary/Chest: Effort normal. No respiratory distress.  Musculoskeletal:  Right wrist: Mild tenderness to palpation of base of thumb. Full ROM Radial pulse 2 +, cap refill < 2 sec     UC Treatments / Results  Labs (all labs ordered are listed, but only abnormal results are displayed) Labs Reviewed - No data to display  EKG  EKG Interpretation None       Radiology Dg Wrist Complete Right  Result Date: 04/04/2017 CLINICAL DATA:  RIGHT wrist pain for over two weeks, no known injury, pain greatest at base of thumb radiating to forearm EXAM: RIGHT WRIST - COMPLETE 3+ VIEW COMPARISON:  None. FINDINGS: Osseous demineralization. Joint space narrowing at at STT joint and at 1st [facility] joint. No acute fracture, dislocation, or bone destruction. IMPRESSION: Osseous demineralization with degenerative changes at radial border of carpus. No acute osseous abnormalities. Electronically Signed   By: Lavonia Dana M.D.   On: 04/04/2017 17:34    Procedures Procedures (including critical care time)  Medications Ordered in UC Medications - No data to display   Initial Impression / Assessment and Plan / UC Course  I have reviewed the triage vital signs and the nursing notes.  Pertinent labs & imaging results that were available during my care of the patient were reviewed by me and considered in my medical decision making (see chart for details).     XR without fracture, evidence of demineralization and degenerative changes at base of radius. Thumb spica applied and voltargen gel, history of CKD want to avoid oral NSAIDs.  Ice/Heat. Discussed return precautions. Patient verbalized understanding and is agreeable with plan.  Final Clinical Impressions(s) / UC Diagnoses   Final diagnoses:  Right wrist pain    ED Discharge Orders        Ordered    diclofenac sodium (VOLTAREN) 1 % GEL  4 times daily     04/04/17 1815       Controlled Substance Prescriptions [place] Controlled Substance Registry consulted? Not Applicable   Janith Lima, [place] 04/04/17 1831

## 2017-04-04 NOTE — ED Triage Notes (Signed)
Pt reports pain in her right wrist for the last 5-6 days.  No known injury to the wrist, but she uses this hand to get up out of bed, where she pushes up on a metal bar.  Her daughter does not know of any other injury or any other reason it would hurt.

## 2017-04-04 NOTE — Discharge Instructions (Signed)
Wear wrist support brace for comfort, may wear around the clock for the first few days until pain is relieved.   May use voltaren gel (anti-inflammatory) apply to wrist for pain relief. May also use Tylenol.   Xray showed degenerative changes wrist specifically near thumb/wrist.

## 2017-04-08 ENCOUNTER — Other Ambulatory Visit: Payer: Self-pay | Admitting: Interventional Cardiology

## 2017-04-08 DIAGNOSIS — I480 Paroxysmal atrial fibrillation: Secondary | ICD-10-CM | POA: Diagnosis not present

## 2017-04-08 DIAGNOSIS — I5032 Chronic diastolic (congestive) heart failure: Secondary | ICD-10-CM | POA: Diagnosis not present

## 2017-04-23 DIAGNOSIS — M25561 Pain in right knee: Secondary | ICD-10-CM | POA: Diagnosis not present

## 2017-04-23 DIAGNOSIS — M65831 Other synovitis and tenosynovitis, right forearm: Secondary | ICD-10-CM | POA: Diagnosis not present

## 2017-05-09 DIAGNOSIS — I5032 Chronic diastolic (congestive) heart failure: Secondary | ICD-10-CM | POA: Diagnosis not present

## 2017-05-09 DIAGNOSIS — I480 Paroxysmal atrial fibrillation: Secondary | ICD-10-CM | POA: Diagnosis not present

## 2017-05-10 ENCOUNTER — Other Ambulatory Visit: Payer: Self-pay | Admitting: Interventional Cardiology

## 2017-05-18 IMAGING — CT CT ABD-PELV W/O CM
2 of 4 series · 17 of 46 positions shown, 19 images · non-contrast
Comparison: 09/20/2013

CLINICAL DATA: Fevers, UTI

EXAM:
CT ABDOMEN AND PELVIS WITHOUT CONTRAST
TECHNIQUE: Multidetector CT imaging of the abdomen and pelvis was performed
following the standard protocol without IV contrast.

[Series 3: abd/ pelvis 5.0 i30f 2 · axial · 0.77mm/px · z∈[+900,+1290]mm · 14 of 86 slices shown, 16 images]
[im 4/86  soft-tissue]
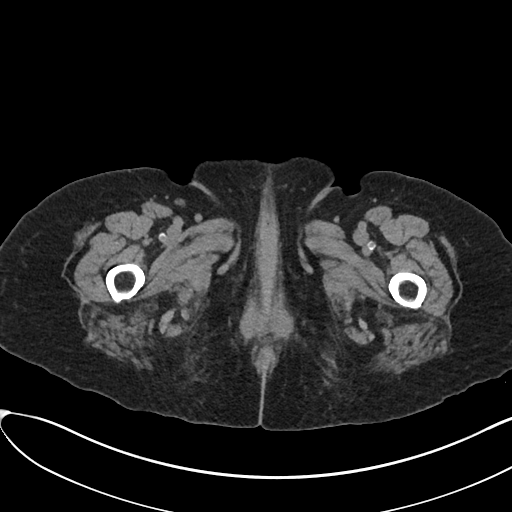
[im 4/86  bone]
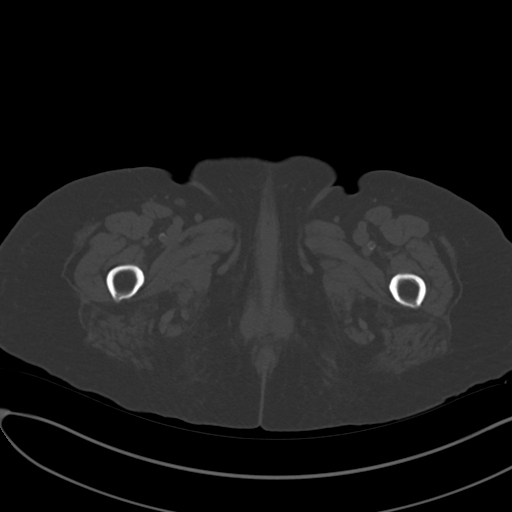
[im 11/86  soft-tissue]
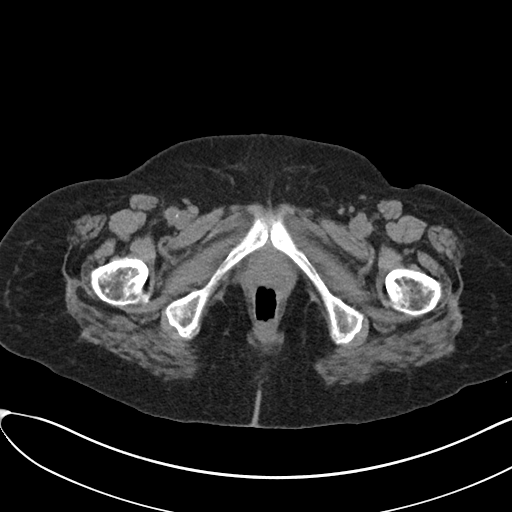
[im 18/86  soft-tissue]
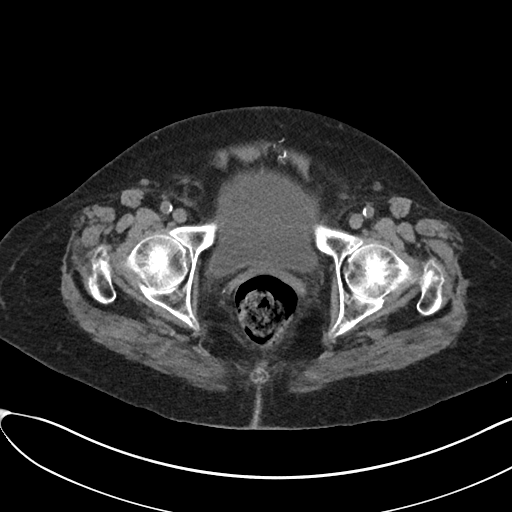
[im 22/86  soft-tissue]
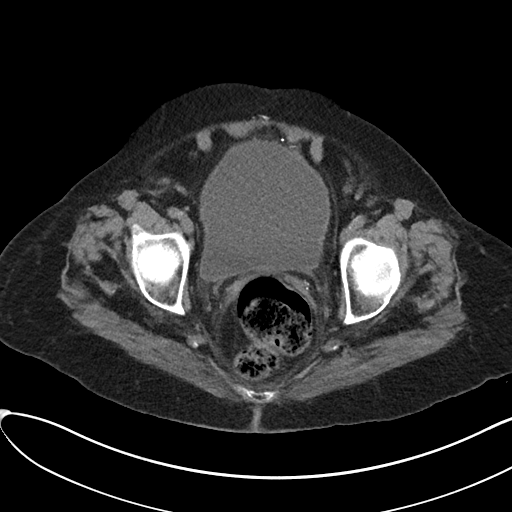
[im 29/86  soft-tissue]
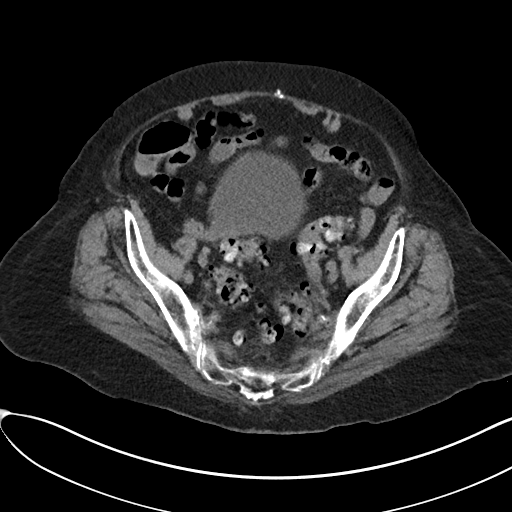
[im 36/86  soft-tissue]
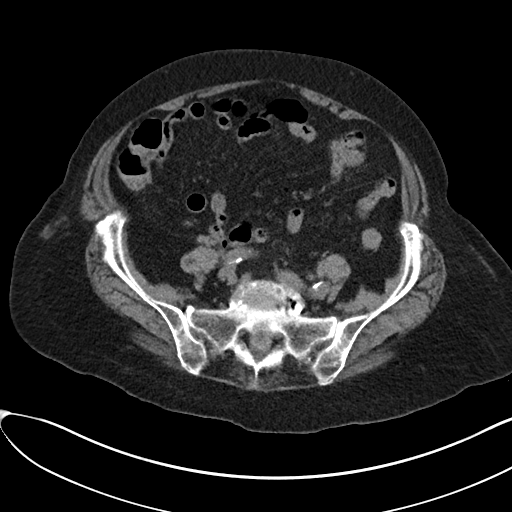
[im 39/86  soft-tissue]
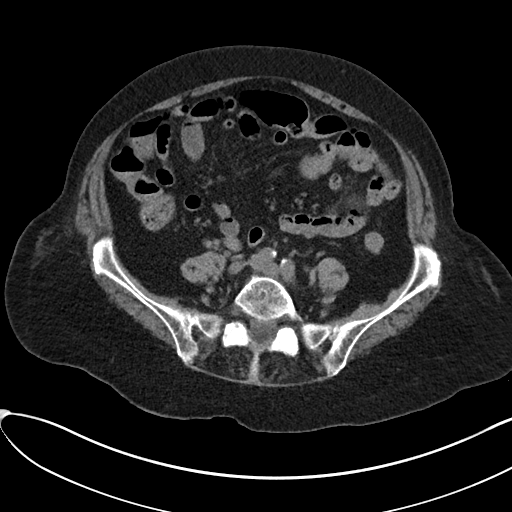
[im 47/86  soft-tissue]
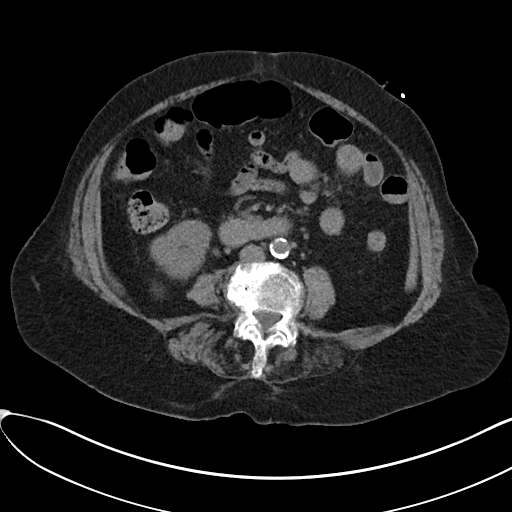
[im 50/86  soft-tissue]
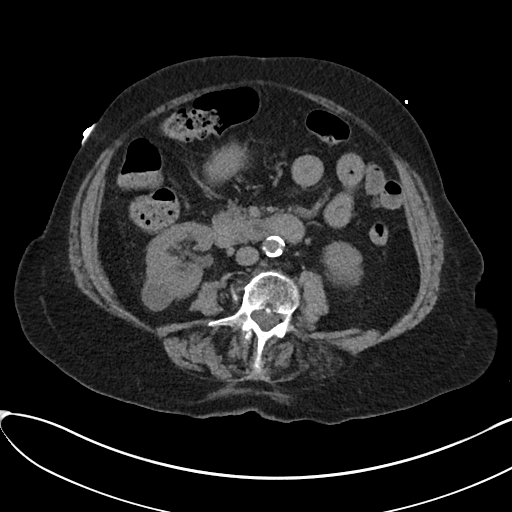
[im 50/86  bone]
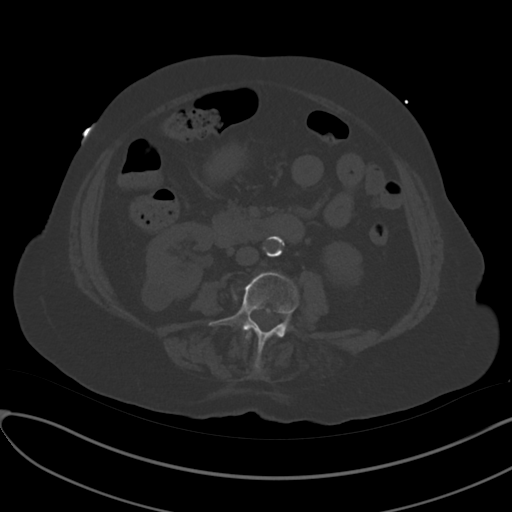
[im 57/86  soft-tissue]
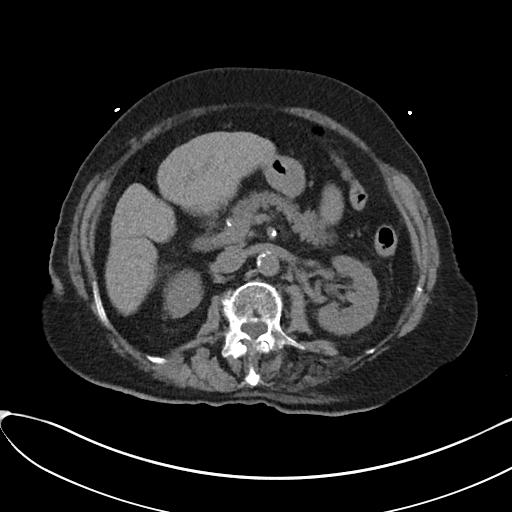
[im 64/86  soft-tissue]
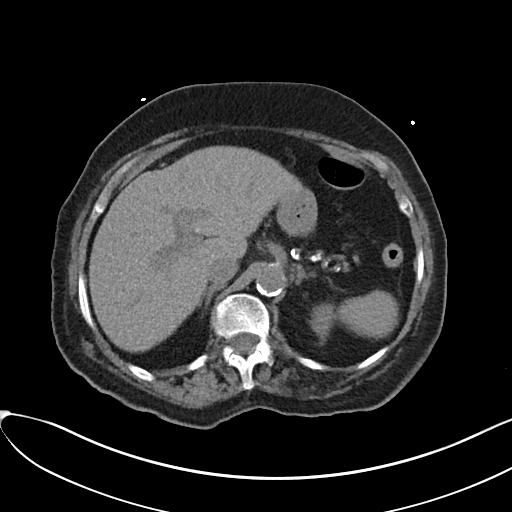
[im 68/86  soft-tissue]
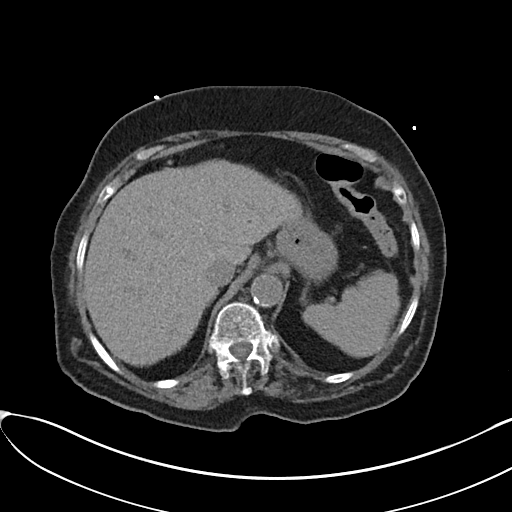
[im 75/86  soft-tissue]
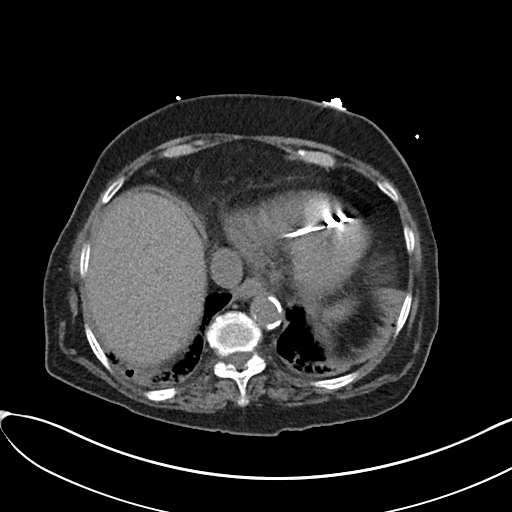
[im 82/86  soft-tissue]
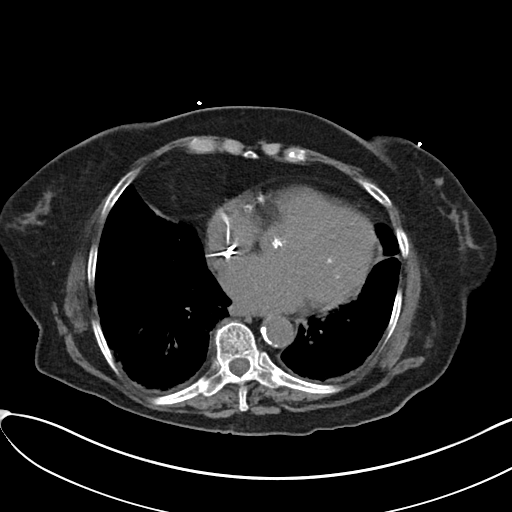

[Series 6: cor st · coronal · 0.80mm/px · 3 of 91 slices shown]
[im 31/91  soft-tissue]
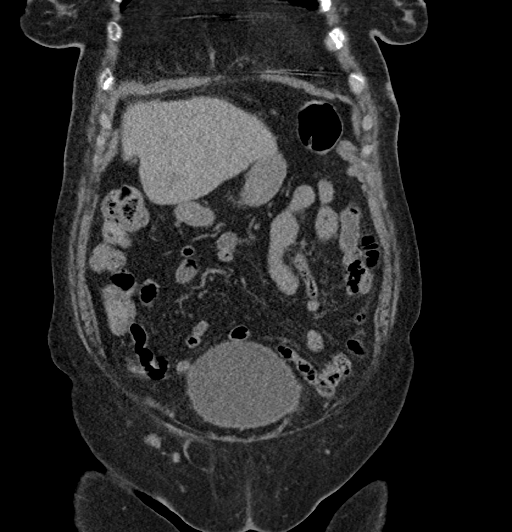
[im 41/91  soft-tissue]
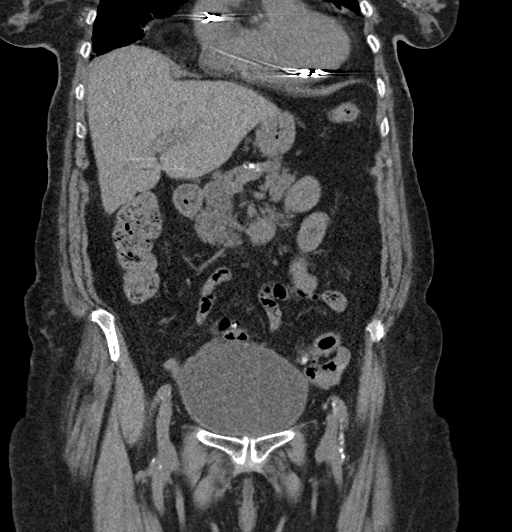
[im 51/91  soft-tissue]
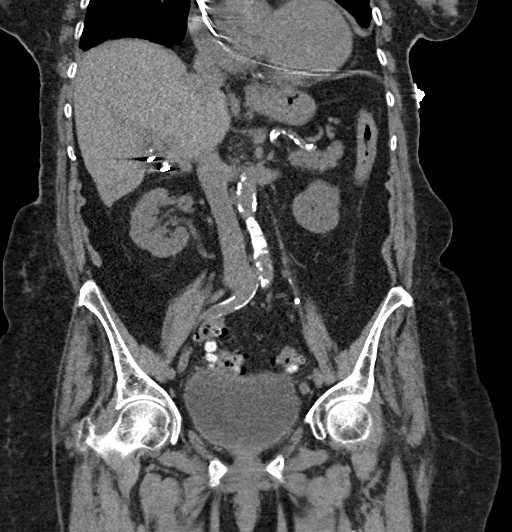

[17 of 46 positions shown; findings below may reference images not displayed]

FINDINGS: Lower chest: Mild chronic scarring is noted bilaterally. No sizable
effusion is seen.

Hepatobiliary: The gallbladder has been surgically removed. The
liver is within normal limits.

Pancreas: Unremarkable. No pancreatic ductal dilatation or
surrounding inflammatory changes.

Spleen: Normal in size without focal abnormality.

Adrenals/Urinary Tract: The adrenal glands are within normal limits.
No renal calculi or obstructive changes are seen. The bladder is
well distended. Exophytic hypodensities are noted arising from the
right kidney similar to those seen previously consistent with renal
cysts.

Stomach/Bowel: Diverticulosis of the colon is seen without
definitive diverticulitis. The appendix has been surgically removed.
No obstructive changes are seen.

Vascular/Lymphatic: Aortic atherosclerosis. No enlarged abdominal or
pelvic lymph nodes.

Reproductive: Status post hysterectomy. No adnexal masses.

Other: No abdominal wall hernia or abnormality. No abdominopelvic
ascites.

Musculoskeletal: Degenerative changes of the lumbar spine are seen.
No acute bony abnormality is noted.
IMPRESSION: Stable chronic changes without acute abnormality.

## 2017-05-19 IMAGING — CR DG CHEST 1V PORT
1 series · 1 of 1 positions shown · non-contrast
Comparison: Portable chest x-ray July 03, 2016.

CLINICAL DATA: Increasing shortness of breath with wheezing today.
History of CHF and recent discontinuation of Lasix.

EXAM:
PORTABLE CHEST 1 VIEW

[portable]
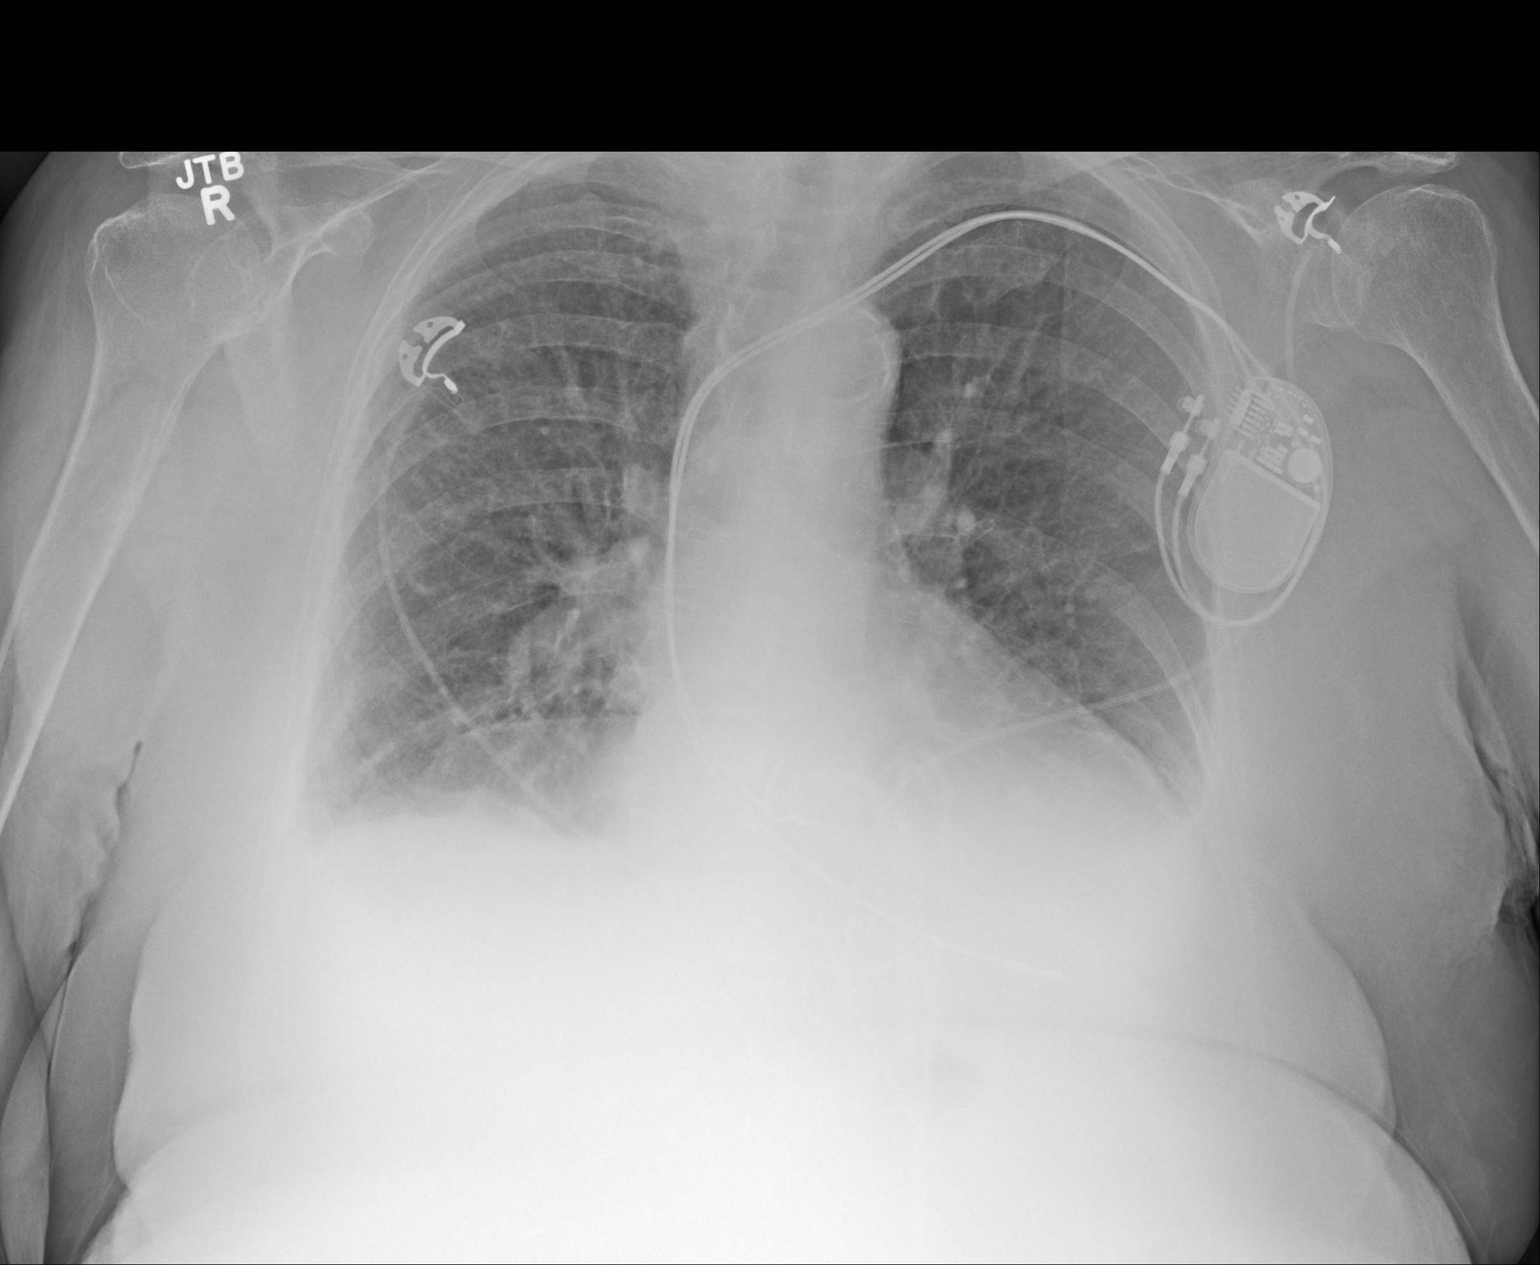

[1 of 1 positions shown; findings below may reference images not displayed]

FINDINGS: The pulmonary interstitial markings are increased today. The
pulmonary vascularity is more engorged. The cardiac silhouette
remains enlarged. There are are small bilateral pleural effusions
slightly more conspicuous today. The ICD is in stable position.
There is calcification in the wall of the aortic arch. The observed
bony thorax is unremarkable.
IMPRESSION: Interval worsening of CHF with pulmonary interstitial edema and
slight increase in small bilateral pleural effusions.

Thoracic aortic atherosclerosis.

## 2017-05-29 DIAGNOSIS — E039 Hypothyroidism, unspecified: Secondary | ICD-10-CM | POA: Diagnosis not present

## 2017-05-29 DIAGNOSIS — N183 Chronic kidney disease, stage 3 (moderate): Secondary | ICD-10-CM | POA: Diagnosis not present

## 2017-05-29 DIAGNOSIS — R269 Unspecified abnormalities of gait and mobility: Secondary | ICD-10-CM | POA: Diagnosis not present

## 2017-05-29 DIAGNOSIS — F322 Major depressive disorder, single episode, severe without psychotic features: Secondary | ICD-10-CM | POA: Diagnosis not present

## 2017-05-29 DIAGNOSIS — K219 Gastro-esophageal reflux disease without esophagitis: Secondary | ICD-10-CM | POA: Diagnosis not present

## 2017-05-29 DIAGNOSIS — I480 Paroxysmal atrial fibrillation: Secondary | ICD-10-CM | POA: Diagnosis not present

## 2017-05-29 DIAGNOSIS — Z0000 Encounter for general adult medical examination without abnormal findings: Secondary | ICD-10-CM | POA: Diagnosis not present

## 2017-05-29 DIAGNOSIS — I2510 Atherosclerotic heart disease of native coronary artery without angina pectoris: Secondary | ICD-10-CM | POA: Diagnosis not present

## 2017-05-29 DIAGNOSIS — I5032 Chronic diastolic (congestive) heart failure: Secondary | ICD-10-CM | POA: Diagnosis not present

## 2017-05-29 DIAGNOSIS — I10 Essential (primary) hypertension: Secondary | ICD-10-CM | POA: Diagnosis not present

## 2017-05-29 DIAGNOSIS — J849 Interstitial pulmonary disease, unspecified: Secondary | ICD-10-CM | POA: Diagnosis not present

## 2017-05-29 DIAGNOSIS — E7800 Pure hypercholesterolemia, unspecified: Secondary | ICD-10-CM | POA: Diagnosis not present

## 2017-05-29 DIAGNOSIS — Z1389 Encounter for screening for other disorder: Secondary | ICD-10-CM | POA: Diagnosis not present

## 2017-05-29 DIAGNOSIS — Z23 Encounter for immunization: Secondary | ICD-10-CM | POA: Diagnosis not present

## 2017-06-04 ENCOUNTER — Ambulatory Visit (INDEPENDENT_AMBULATORY_CARE_PROVIDER_SITE_OTHER): Payer: PPO | Admitting: *Deleted

## 2017-06-04 DIAGNOSIS — I495 Sick sinus syndrome: Secondary | ICD-10-CM | POA: Diagnosis not present

## 2017-06-04 NOTE — Progress Notes (Signed)
Remote pacemaker transmission.   

## 2017-06-05 DIAGNOSIS — I10 Essential (primary) hypertension: Secondary | ICD-10-CM | POA: Diagnosis not present

## 2017-06-05 DIAGNOSIS — R7309 Other abnormal glucose: Secondary | ICD-10-CM | POA: Diagnosis not present

## 2017-06-05 DIAGNOSIS — E039 Hypothyroidism, unspecified: Secondary | ICD-10-CM | POA: Diagnosis not present

## 2017-06-06 ENCOUNTER — Encounter: Payer: Self-pay | Admitting: Cardiology

## 2017-06-06 DIAGNOSIS — I5032 Chronic diastolic (congestive) heart failure: Secondary | ICD-10-CM | POA: Diagnosis not present

## 2017-06-06 DIAGNOSIS — I480 Paroxysmal atrial fibrillation: Secondary | ICD-10-CM | POA: Diagnosis not present

## 2017-06-19 ENCOUNTER — Other Ambulatory Visit: Payer: Self-pay | Admitting: Interventional Cardiology

## 2017-06-19 MED ORDER — CLOPIDOGREL BISULFATE 75 MG PO TABS: ORAL_TABLET | ORAL | 2 refills | Status: DC

## 2017-06-20 LAB — CUP PACEART REMOTE DEVICE CHECK
Battery Remaining Percentage: 95.5 %
Brady Statistic AP VP Percent: 4.5 %
Brady Statistic AP VS Percent: 95 %
Brady Statistic AS VP Percent: 1 %
Brady Statistic AS VS Percent: 1 %
Brady Statistic RV Percent Paced: 4.6 %
Implantable Lead Implant Date: 20170207
Implantable Lead Location: 753859
Implantable Pulse Generator Implant Date: 20170207
Lead Channel Impedance Value: 380 Ohm
Lead Channel Pacing Threshold Amplitude: 0.75 V
Lead Channel Pacing Threshold Pulse Width: 0.5 ms
Lead Channel Sensing Intrinsic Amplitude: 1.8 mV
Lead Channel Setting Pacing Amplitude: 2.5 V
Lead Channel Setting Pacing Pulse Width: 0.5 ms
Lead Channel Setting Sensing Sensitivity: 2 mV
MDC IDC LEAD IMPLANT DT: 20170207
MDC IDC LEAD LOCATION: 753860
MDC IDC MSMT BATTERY REMAINING LONGEVITY: 79 mo
MDC IDC MSMT BATTERY VOLTAGE: 2.99 V
MDC IDC MSMT LEADCHNL RA IMPEDANCE VALUE: 440 Ohm
MDC IDC MSMT LEADCHNL RA PACING THRESHOLD AMPLITUDE: 1.5 V
MDC IDC MSMT LEADCHNL RA PACING THRESHOLD PULSEWIDTH: 0.5 ms
MDC IDC MSMT LEADCHNL RV SENSING INTR AMPL: 3.7 mV
MDC IDC PG SERIAL: 7866856
MDC IDC SESS DTM: 20190226070013
MDC IDC SET LEADCHNL RA PACING AMPLITUDE: 2.5 V
MDC IDC STAT BRADY RA PERCENT PACED: 99 %

## 2017-07-07 DIAGNOSIS — I480 Paroxysmal atrial fibrillation: Secondary | ICD-10-CM | POA: Diagnosis not present

## 2017-07-07 DIAGNOSIS — I5032 Chronic diastolic (congestive) heart failure: Secondary | ICD-10-CM | POA: Diagnosis not present

## 2017-07-20 ENCOUNTER — Emergency Department (HOSPITAL_BASED_OUTPATIENT_CLINIC_OR_DEPARTMENT_OTHER): Payer: PPO

## 2017-07-20 ENCOUNTER — Emergency Department (HOSPITAL_BASED_OUTPATIENT_CLINIC_OR_DEPARTMENT_OTHER)
Admission: EM | Admit: 2017-07-20 | Discharge: 2017-07-20 | Disposition: A | Discharge status: Home / Self Care | Payer: PPO | Attending: Emergency Medicine | Admitting: Emergency Medicine

## 2017-07-20 ENCOUNTER — Other Ambulatory Visit: Payer: Self-pay

## 2017-07-20 ENCOUNTER — Encounter (HOSPITAL_BASED_OUTPATIENT_CLINIC_OR_DEPARTMENT_OTHER): Payer: Self-pay | Admitting: Emergency Medicine

## 2017-07-20 DIAGNOSIS — I2510 Atherosclerotic heart disease of native coronary artery without angina pectoris: Secondary | ICD-10-CM | POA: Diagnosis not present

## 2017-07-20 DIAGNOSIS — Y929 Unspecified place or not applicable: Secondary | ICD-10-CM | POA: Diagnosis not present

## 2017-07-20 DIAGNOSIS — J449 Chronic obstructive pulmonary disease, unspecified: Secondary | ICD-10-CM | POA: Insufficient documentation

## 2017-07-20 DIAGNOSIS — S40021A Contusion of right upper arm, initial encounter: Secondary | ICD-10-CM | POA: Diagnosis not present

## 2017-07-20 DIAGNOSIS — S4991XA Unspecified injury of right shoulder and upper arm, initial encounter: Secondary | ICD-10-CM | POA: Diagnosis not present

## 2017-07-20 DIAGNOSIS — Z5189 Encounter for other specified aftercare: Secondary | ICD-10-CM | POA: Insufficient documentation

## 2017-07-20 DIAGNOSIS — I130 Hypertensive heart and chronic kidney disease with heart failure and stage 1 through stage 4 chronic kidney disease, or unspecified chronic kidney disease: Secondary | ICD-10-CM | POA: Diagnosis not present

## 2017-07-20 DIAGNOSIS — W19XXXD Unspecified fall, subsequent encounter: Secondary | ICD-10-CM | POA: Insufficient documentation

## 2017-07-20 DIAGNOSIS — M79601 Pain in right arm: Secondary | ICD-10-CM | POA: Diagnosis not present

## 2017-07-20 DIAGNOSIS — Y998 Other external cause status: Secondary | ICD-10-CM | POA: Diagnosis not present

## 2017-07-20 DIAGNOSIS — N183 Chronic kidney disease, stage 3 (moderate): Secondary | ICD-10-CM | POA: Diagnosis not present

## 2017-07-20 DIAGNOSIS — R52 Pain, unspecified: Secondary | ICD-10-CM

## 2017-07-20 DIAGNOSIS — F0390 Unspecified dementia without behavioral disturbance: Secondary | ICD-10-CM | POA: Insufficient documentation

## 2017-07-20 DIAGNOSIS — Z7902 Long term (current) use of antithrombotics/antiplatelets: Secondary | ICD-10-CM | POA: Diagnosis not present

## 2017-07-20 DIAGNOSIS — S41111D Laceration without foreign body of right upper arm, subsequent encounter: Secondary | ICD-10-CM | POA: Diagnosis not present

## 2017-07-20 DIAGNOSIS — Y939 Activity, unspecified: Secondary | ICD-10-CM | POA: Diagnosis not present

## 2017-07-20 DIAGNOSIS — I5032 Chronic diastolic (congestive) heart failure: Secondary | ICD-10-CM | POA: Diagnosis not present

## 2017-07-20 NOTE — ED Provider Notes (Signed)
[facility] EMERGENCY DEPARTMENT Provider Note   CSN: [id number] Arrival date & time: 07/20/17  1657     History   Chief Complaint Chief Complaint  Patient presents with  . Wound Check    HPI Laura Zuniga is a 84 y.o. female.  Pt presents to the ED today with knots to right arm.  Pt fell on 3/27 and sustained a skin tear.  The pt's daughter said that is now healed, but some knots have formed under the skin.  She is very worried about a DVT.     Past Medical History:  Diagnosis Date  . Bradycardia    a. Amiodarone DC'd 12/16 >> b. Holter 1/17 - NSR, sinus brady, junctional rhythm, pause 2.4 s  . C. difficile colitis 2015  . CAD (coronary artery disease)    never had cardiac cath, CAD dx by positive ant. wall defect on Nuc. study 2007 >>  no cath due to high risk/advanced age  . Chronic diastolic CHF (congestive heart failure) ([place])    a. Echo 6/15 - Mild LVH, EF 55-60%, mild AI, mild MR, moderate LAE, mild RAE, PASP 52 mm Hg  . CKD (chronic kidney disease)   . COPD (chronic obstructive pulmonary disease) ([place])   . Diverticulitis   . GERD (gastroesophageal reflux disease)   . Hypertension   . Macular degeneration   . PAF (paroxysmal atrial fibrillation) ([place])    not optimal candidate for anticoagulation    Patient Active Problem List   Diagnosis Date Noted  . Sepsis ([place]) 07/03/2016  . Abnormal urinalysis 07/03/2016  . Dysphagia (mild-tolerates regular/thin liquids w/ swallow techniques) 07/03/2016  . Pulmonary HTN ([place]) 07/03/2016  . Transaminitis 07/03/2016  . Hypothyroidism 07/03/2016  . Elevated liver function tests   . UTI (urinary tract infection) with pyuria   . ILD (interstitial lung disease) ([place]) 02/17/2016  . Chronic cough 02/17/2016  . Cardiac pacemaker 02/16/2016  . Sinus node dysfunction ([place]) 05/05/2015  . CKD (chronic kidney disease), stage III ([place]) 09/14/2014  . GERD (gastroesophageal reflux disease) 10/29/2013  . Hyponatremia  10/21/2013  . Acute blood loss anemia 10/01/2013  . Urinary retention 10/01/2013  . Chronic diastolic heart failure ([place]) 09/28/2013  . PAF (paroxysmal atrial fibrillation) ([place]) 09/27/2013  . Melena 09/22/2013  . ATN (acute tubular necrosis) ([place]) 09/21/2013  . Nonspecific abnormal results of cardiovascular function study 06/11/2013  . Syncope 06/28/2011  . Swelling of left lower extremity 06/28/2011  . DEMENTIA, CCE, W/O BEHAVIORAL DISTURBANCE 01/17/2007  . RENAL STONE 12/04/2006  . HLD (hyperlipidemia) 07/15/2006  . DEPRESSION 07/15/2006  . BLINDNESS, LEFT EYE 07/15/2006  . Essential hypertension 07/15/2006  . CAD (coronary artery disease) 07/15/2006  . OSTEOPOROSIS 07/15/2006    Past Surgical History:  Procedure Laterality Date  . APPENDECTOMY    . CHOLECYSTECTOMY    . EP IMPLANTABLE DEVICE N/A 05/17/2015   Procedure: Pacemaker Implant;  Surgeon: Evans Lance, MD;  Location: [facility] CV LAB;  Service: Cardiovascular;  Laterality: N/A;  . ESOPHAGOGASTRODUODENOSCOPY N/A 09/22/2013   Procedure: ESOPHAGOGASTRODUODENOSCOPY (EGD);  Surgeon: Lear Ng, MD;  Location: [facility] ENDOSCOPY;  Service: Endoscopy;  Laterality: N/A;  . ESOPHAGOGASTRODUODENOSCOPY N/A 09/22/2013   Procedure: ESOPHAGOGASTRODUODENOSCOPY (EGD);  Surgeon: Lear Ng, MD;  Location: [facility] ENDOSCOPY;  Service: Endoscopy;  Laterality: N/A;  . EYE SURGERY    . FLEXIBLE SIGMOIDOSCOPY N/A 09/22/2013   Procedure: FLEXIBLE SIGMOIDOSCOPY;  Surgeon: Lear Ng, MD;  Location: [facility] ENDOSCOPY;  Service: Endoscopy;  Laterality:  N/A;  . FLEXIBLE SIGMOIDOSCOPY N/A 09/22/2013   Procedure: FLEXIBLE SIGMOIDOSCOPY;  Surgeon: Lear Ng, MD;  Location: [facility] ENDOSCOPY;  Service: Endoscopy;  Laterality: N/A;  unprepped/ egd first  . VAGINAL HYSTERECTOMY       OB History   None      Home Medications    Prior to Admission medications   Medication Sig Start Date End Date Taking? Authorizing Provider    acetaminophen (TYLENOL) 500 MG tablet Take 2 tablets (1,000 mg total) by mouth every 6 (six) hours as needed for mild pain or moderate pain. 05/27/16   Duffy Bruce, MD  albuterol (PROVENTIL HFA;VENTOLIN HFA) 108 (90 BASE) MCG/ACT inhaler Inhale 2 puffs into the lungs every 6 (six) hours as needed for wheezing or shortness of breath. 02/23/15   Deloria Lair, NP  Biotin 1 MG CAPS Take 1 mg by mouth daily.    [provider]  clopidogrel (PLAVIX) 75 MG tablet TAKE 75 MG BY MOUTH ONCE DAILY 06/19/17   Jettie Booze, MD  Cranberry 250 MG CAPS Take 250 mg by mouth daily after lunch.     [provider]  docusate sodium (COLACE) 100 MG capsule Take 100 mg by mouth daily as needed for mild constipation.    [provider]  escitalopram (LEXAPRO) 10 MG tablet Take 15 mg daily by mouth.     [provider]  ferrous sulfate 325 (65 FE) MG tablet Take 325 mg by mouth daily after lunch.     [provider]  furosemide (LASIX) 80 MG tablet Take 1 tablet by mouth in the morning, 1/2 tablet (40 mg)  in the evening and can take 1/2 tablet daily as needed for swelling    [provider]  hydrALAZINE (APRESOLINE) 25 MG tablet TAKE 1 TABLET BY MOUTH TWICE DAILY 05/10/17   Jettie Booze, MD  isosorbide mononitrate (IMDUR) 60 MG 24 hr tablet TAKE ONE TABLET BY MOUTH ONCE DAILY 04/08/17   Jettie Booze, MD  levothyroxine (SYNTHROID, LEVOTHROID) 50 MCG tablet Take 50 mcg by mouth daily before breakfast.  06/04/14   [provider]  Multiple Vitamins-Minerals (ICAPS AREDS 2 PO) Take 1 tablet by mouth daily.    [provider]  PACERONE 100 MG tablet TAKE 1 TABLET BY MOUTH ONCE DAILY **NEEDS APPOINTMENT WITH DR. Lovena Le** 02/14/17   Jettie Booze, MD  pantoprazole (PROTONIX) 40 MG tablet Take 40 mg by mouth daily after lunch.     [provider]  potassium chloride SA (K-DUR,KLOR-CON) 20 MEQ tablet Take 20 mEq by  mouth 2 (two) times daily.  05/20/14   [provider]  pravastatin (PRAVACHOL) 40 MG tablet Take 40 mg daily by mouth. 01/07/17   [provider]  ranitidine (ZANTAC) 150 MG capsule Take 150 mg daily by mouth. 02/06/17   [provider]  vitamin C (ASCORBIC ACID) 500 MG tablet Take 500 mg by mouth daily after lunch.     [provider]  vitamin E 400 UNIT capsule Take 400 Units by mouth daily after lunch.    [provider]    Family History Family History  Problem Relation Age of Onset  . Heart attack Father 64  . Heart attack Sister   . Cancer Brother   . Cancer Brother   . Cirrhosis Brother   . Heart disease Brother   . Cancer Brother   . COPD Brother   . COPD Sister   . Cancer Sister   .  Cancer Sister   . Hypertension Daughter   . Stroke Neg Hx     Social History Social History   Tobacco Use  . Smoking status: Never Smoker  . Smokeless tobacco: Never Used  Substance Use Topics  . Alcohol use: No    Alcohol/week: 0.0 oz  . Drug use: No     Allergies   Penicillins; 2,4-d dimethylamine (amisol); Ace inhibitors; Lanolin; Vancomycin; and Sulfonamide derivatives   Review of Systems Review of Systems  Musculoskeletal:       Right arm pain  All other systems reviewed and are negative.    Physical Exam Updated Vital Signs BP (!) 153/60 (BP Location: Left Arm)   Pulse 62   Temp 98.4 F (36.9 C) (Oral)   Resp 16   Ht 5' (1.524 m)   Wt 64.4 kg (142 lb)   SpO2 96%   BMI 27.73 kg/m   Physical Exam  Constitutional: She is oriented to person, place, and time. She appears well-developed and well-nourished.  HENT:  Head: Normocephalic and atraumatic.  Right Ear: External ear normal.  Left Ear: External ear normal.  Nose: Nose normal.  Mouth/Throat: Oropharynx is clear and moist.  Eyes: Pupils are equal, round, and reactive to light. Conjunctivae and EOM are normal.  Neck: Normal range of motion. Neck supple.   Cardiovascular: Normal rate, regular rhythm, normal heart sounds and intact distal pulses.  Pulmonary/Chest: Effort normal and breath sounds normal.  Abdominal: Soft. Bowel sounds are normal.  Musculoskeletal:       Arms: Neurological: She is alert and oriented to person, place, and time.  Skin: Skin is warm. Capillary refill takes less than 2 seconds.  Psychiatric: She has a normal mood and affect. Her behavior is normal. Judgment and thought content normal.  Nursing note and vitals reviewed.    ED Treatments / Results  Labs (all labs ordered are listed, but only abnormal results are displayed) Labs Reviewed - No data to display  EKG None  Radiology US Venous Img Upper Uni Right  Result Date: 07/20/2017 CLINICAL DATA:  Knots in RIGHT upper extremity after falling 1 week ago, skin tears EXAM: RIGHT UPPER EXTREMITY VENOUS DOPPLER ULTRASOUND TECHNIQUE: Gray-scale sonography with graded compression, as well as color Doppler and duplex ultrasound were performed to evaluate the upper extremity deep venous system from the level of the subclavian vein and including the jugular, axillary, basilic, radial, ulnar and upper cephalic vein. Spectral Doppler was utilized to evaluate flow at rest and with distal augmentation maneuvers. COMPARISON:  None FINDINGS: Contralateral Subclavian Vein: Respiratory phasicity is normal and symmetric with the symptomatic side. No evidence of thrombus. Normal compressibility. Internal Jugular Vein: No evidence of thrombus. Normal compressibility, respiratory phasicity and response to augmentation. Subclavian Vein: No evidence of thrombus. Normal compressibility, respiratory phasicity and response to augmentation. Axillary Vein: No evidence of thrombus. Normal compressibility, respiratory phasicity and response to augmentation. Cephalic Vein: No evidence of thrombus. Normal compressibility, respiratory phasicity and response to augmentation. Basilic Vein: No evidence of  thrombus. Normal compressibility, respiratory phasicity and response to augmentation. Brachial Veins: No evidence of thrombus. Normal compressibility, respiratory phasicity and response to augmentation. Radial Veins: No evidence of thrombus. Normal compressibility, respiratory phasicity and response to augmentation. Ulnar Veins: No evidence of thrombus. Normal compressibility, respiratory phasicity and response to augmentation. Venous Reflux:  None visualized. Other Findings: At the sites of palpable concern, poorly defined areas of heterogeneous subcutaneous echogenicity are identified, nonspecific, without discrete mass. A few tiny hypoechoic  foci are seen which could represent minimal fluid/hemorrhage. IMPRESSION: No evidence of DVT within the RIGHT upper extremity. Nonspecific subcutaneous soft tissue changes at sites of clinical concern. Electronically Signed   By: Lavonia Dana M.D.   On: 07/20/2017 18:39   Dg Humerus Right  Result Date: 07/20/2017 CLINICAL DATA:  Knots at upper RIGHT arm after falling 1 week ago and suffering skin tears EXAM: RIGHT HUMERUS - 2+ VIEW COMPARISON:  None FINDINGS: Osseous demineralization. Shoulder and elbow joint alignments normal. No acute fracture, dislocation, or bone destruction. Mild degenerative changes at the RIGHT [facility] joint. IMPRESSION: No acute osseous abnormalities. Mild degenerative changes RIGHT AC joint. Electronically Signed   By: Lavonia Dana M.D.   On: 07/20/2017 18:41    Procedures Procedures (including critical care time)  Medications Ordered in ED Medications - No data to display   Initial Impression / Assessment and Plan / ED Course  I have reviewed the triage vital signs and the nursing notes.  Pertinent labs & imaging results that were available during my care of the patient were reviewed by me and considered in my medical decision making (see chart for details).     No DVT.  No fx.  Pt is stable for d/c.  Return if worse.  F/u with  pcp.  Final Clinical Impressions(s) / ED Diagnoses   Final diagnoses:  Pain  Visit for wound check  Hematoma of arm, right, initial encounter    ED Discharge Orders    None       Isla Pence, MD 07/20/17 [phone]

## 2017-07-20 NOTE — ED Triage Notes (Signed)
Pt fell several weeks ago and got a skin tear to R upper arm. Her daughter states she has been keeping it clean and bandaged. Now concerned about knots that she noticed in the area.

## 2017-07-20 NOTE — ED Notes (Signed)
Pt d/c home with family in her own wheelchair

## 2017-08-06 DIAGNOSIS — I5032 Chronic diastolic (congestive) heart failure: Secondary | ICD-10-CM | POA: Diagnosis not present

## 2017-08-06 DIAGNOSIS — I480 Paroxysmal atrial fibrillation: Secondary | ICD-10-CM | POA: Diagnosis not present

## 2017-08-20 ENCOUNTER — Other Ambulatory Visit: Payer: Self-pay | Admitting: Internal Medicine

## 2017-08-20 DIAGNOSIS — R1310 Dysphagia, unspecified: Secondary | ICD-10-CM

## 2017-08-20 DIAGNOSIS — E7800 Pure hypercholesterolemia, unspecified: Secondary | ICD-10-CM | POA: Diagnosis not present

## 2017-08-20 DIAGNOSIS — J42 Unspecified chronic bronchitis: Secondary | ICD-10-CM | POA: Diagnosis not present

## 2017-08-20 DIAGNOSIS — N183 Chronic kidney disease, stage 3 (moderate): Secondary | ICD-10-CM | POA: Diagnosis not present

## 2017-08-20 DIAGNOSIS — F322 Major depressive disorder, single episode, severe without psychotic features: Secondary | ICD-10-CM | POA: Diagnosis not present

## 2017-08-20 DIAGNOSIS — I5032 Chronic diastolic (congestive) heart failure: Secondary | ICD-10-CM | POA: Diagnosis not present

## 2017-08-20 DIAGNOSIS — I480 Paroxysmal atrial fibrillation: Secondary | ICD-10-CM | POA: Diagnosis not present

## 2017-08-20 DIAGNOSIS — K219 Gastro-esophageal reflux disease without esophagitis: Secondary | ICD-10-CM

## 2017-08-20 DIAGNOSIS — J849 Interstitial pulmonary disease, unspecified: Secondary | ICD-10-CM | POA: Diagnosis not present

## 2017-08-20 DIAGNOSIS — E039 Hypothyroidism, unspecified: Secondary | ICD-10-CM | POA: Diagnosis not present

## 2017-08-20 DIAGNOSIS — I10 Essential (primary) hypertension: Secondary | ICD-10-CM | POA: Diagnosis not present

## 2017-08-26 ENCOUNTER — Ambulatory Visit
Admission: RE | Admit: 2017-08-26 | Discharge: 2017-08-26 | Disposition: A | Discharge status: Home / Self Care | Payer: PPO | Source: Ambulatory Visit | Attending: Internal Medicine | Admitting: Internal Medicine

## 2017-08-26 DIAGNOSIS — K2980 Duodenitis without bleeding: Secondary | ICD-10-CM | POA: Diagnosis not present

## 2017-08-26 DIAGNOSIS — R1310 Dysphagia, unspecified: Secondary | ICD-10-CM

## 2017-08-26 DIAGNOSIS — K219 Gastro-esophageal reflux disease without esophagitis: Secondary | ICD-10-CM

## 2017-09-03 ENCOUNTER — Ambulatory Visit (INDEPENDENT_AMBULATORY_CARE_PROVIDER_SITE_OTHER): Payer: PPO | Admitting: *Deleted

## 2017-09-03 ENCOUNTER — Telehealth: Payer: Self-pay

## 2017-09-03 DIAGNOSIS — I495 Sick sinus syndrome: Secondary | ICD-10-CM | POA: Diagnosis not present

## 2017-09-03 NOTE — Telephone Encounter (Signed)
I spoke with the patient about this.

## 2017-09-04 NOTE — Progress Notes (Signed)
Remote pacemaker transmission.   

## 2017-09-06 ENCOUNTER — Encounter: Payer: Self-pay | Admitting: Cardiology

## 2017-09-06 DIAGNOSIS — I5032 Chronic diastolic (congestive) heart failure: Secondary | ICD-10-CM | POA: Diagnosis not present

## 2017-09-06 DIAGNOSIS — I480 Paroxysmal atrial fibrillation: Secondary | ICD-10-CM | POA: Diagnosis not present

## 2017-09-24 LAB — CUP PACEART REMOTE DEVICE CHECK
Battery Voltage: 2.98 V
Brady Statistic AP VP Percent: 6.1 %
Brady Statistic RA Percent Paced: 99 %
Brady Statistic RV Percent Paced: 6.2 %
Implantable Lead Implant Date: 20170207
Implantable Lead Location: 753860
Implantable Pulse Generator Implant Date: 20170207
Lead Channel Impedance Value: 390 Ohm
Lead Channel Pacing Threshold Amplitude: 1.5 V
Lead Channel Pacing Threshold Pulse Width: 0.5 ms
Lead Channel Setting Sensing Sensitivity: 2 mV
MDC IDC LEAD IMPLANT DT: 20170207
MDC IDC LEAD LOCATION: 753859
MDC IDC MSMT BATTERY REMAINING LONGEVITY: 104 mo
MDC IDC MSMT BATTERY REMAINING PERCENTAGE: 95.5 %
MDC IDC MSMT LEADCHNL RA IMPEDANCE VALUE: 440 Ohm
MDC IDC MSMT LEADCHNL RA SENSING INTR AMPL: 2.1 mV
MDC IDC MSMT LEADCHNL RV PACING THRESHOLD AMPLITUDE: 0.75 V
MDC IDC MSMT LEADCHNL RV PACING THRESHOLD PULSEWIDTH: 0.5 ms
MDC IDC MSMT LEADCHNL RV SENSING INTR AMPL: 5 mV
MDC IDC PG SERIAL: 7866856
MDC IDC SESS DTM: 20190528133516
MDC IDC SET LEADCHNL RA PACING AMPLITUDE: 2.5 V
MDC IDC SET LEADCHNL RV PACING AMPLITUDE: 2.5 V
MDC IDC SET LEADCHNL RV PACING PULSEWIDTH: 0.5 ms
MDC IDC STAT BRADY AP VS PERCENT: 94 %
MDC IDC STAT BRADY AS VP PERCENT: 1 %
MDC IDC STAT BRADY AS VS PERCENT: 1 %
Pulse Gen Model: 2240

## 2017-10-06 DIAGNOSIS — I480 Paroxysmal atrial fibrillation: Secondary | ICD-10-CM | POA: Diagnosis not present

## 2017-10-06 DIAGNOSIS — I5032 Chronic diastolic (congestive) heart failure: Secondary | ICD-10-CM | POA: Diagnosis not present

## 2017-11-06 DIAGNOSIS — I480 Paroxysmal atrial fibrillation: Secondary | ICD-10-CM | POA: Diagnosis not present

## 2017-11-06 DIAGNOSIS — I5032 Chronic diastolic (congestive) heart failure: Secondary | ICD-10-CM | POA: Diagnosis not present

## 2017-11-08 DIAGNOSIS — B372 Candidiasis of skin and nail: Secondary | ICD-10-CM | POA: Diagnosis not present

## 2017-11-20 DIAGNOSIS — I2510 Atherosclerotic heart disease of native coronary artery without angina pectoris: Secondary | ICD-10-CM | POA: Diagnosis not present

## 2017-11-20 DIAGNOSIS — J42 Unspecified chronic bronchitis: Secondary | ICD-10-CM | POA: Diagnosis not present

## 2017-11-20 DIAGNOSIS — J849 Interstitial pulmonary disease, unspecified: Secondary | ICD-10-CM | POA: Diagnosis not present

## 2017-11-20 DIAGNOSIS — F324 Major depressive disorder, single episode, in partial remission: Secondary | ICD-10-CM | POA: Diagnosis not present

## 2017-11-20 DIAGNOSIS — E039 Hypothyroidism, unspecified: Secondary | ICD-10-CM | POA: Diagnosis not present

## 2017-11-20 DIAGNOSIS — I10 Essential (primary) hypertension: Secondary | ICD-10-CM | POA: Diagnosis not present

## 2017-11-20 DIAGNOSIS — E7800 Pure hypercholesterolemia, unspecified: Secondary | ICD-10-CM | POA: Diagnosis not present

## 2017-11-20 DIAGNOSIS — N183 Chronic kidney disease, stage 3 (moderate): Secondary | ICD-10-CM | POA: Diagnosis not present

## 2017-11-20 DIAGNOSIS — I480 Paroxysmal atrial fibrillation: Secondary | ICD-10-CM | POA: Diagnosis not present

## 2017-11-20 DIAGNOSIS — R269 Unspecified abnormalities of gait and mobility: Secondary | ICD-10-CM | POA: Diagnosis not present

## 2017-11-20 DIAGNOSIS — R1310 Dysphagia, unspecified: Secondary | ICD-10-CM | POA: Diagnosis not present

## 2017-11-20 DIAGNOSIS — I5032 Chronic diastolic (congestive) heart failure: Secondary | ICD-10-CM | POA: Diagnosis not present

## 2017-11-22 ENCOUNTER — Other Ambulatory Visit: Payer: Self-pay | Admitting: Interventional Cardiology

## 2017-11-23 DIAGNOSIS — I5032 Chronic diastolic (congestive) heart failure: Secondary | ICD-10-CM | POA: Diagnosis not present

## 2017-11-23 DIAGNOSIS — I480 Paroxysmal atrial fibrillation: Secondary | ICD-10-CM | POA: Diagnosis not present

## 2017-12-03 ENCOUNTER — Ambulatory Visit (INDEPENDENT_AMBULATORY_CARE_PROVIDER_SITE_OTHER): Payer: PPO | Admitting: *Deleted

## 2017-12-03 DIAGNOSIS — I495 Sick sinus syndrome: Secondary | ICD-10-CM

## 2017-12-03 NOTE — Progress Notes (Signed)
Remote pacemaker transmission.   

## 2017-12-05 ENCOUNTER — Encounter: Payer: Self-pay | Admitting: Cardiology

## 2017-12-05 NOTE — Progress Notes (Signed)
Letter  

## 2017-12-07 DIAGNOSIS — I480 Paroxysmal atrial fibrillation: Secondary | ICD-10-CM | POA: Diagnosis not present

## 2017-12-07 DIAGNOSIS — I5032 Chronic diastolic (congestive) heart failure: Secondary | ICD-10-CM | POA: Diagnosis not present

## 2018-01-06 DIAGNOSIS — J411 Mucopurulent chronic bronchitis: Secondary | ICD-10-CM | POA: Diagnosis not present

## 2018-01-06 DIAGNOSIS — I480 Paroxysmal atrial fibrillation: Secondary | ICD-10-CM | POA: Diagnosis not present

## 2018-01-06 DIAGNOSIS — I5032 Chronic diastolic (congestive) heart failure: Secondary | ICD-10-CM | POA: Diagnosis not present

## 2018-01-06 LAB — CUP PACEART REMOTE DEVICE CHECK
Battery Remaining Longevity: 102 mo
Battery Remaining Percentage: 95.5 %
Battery Voltage: 2.99 V
Brady Statistic AP VP Percent: 5.6 %
Brady Statistic AP VS Percent: 94 %
Brady Statistic AS VP Percent: 1 %
Brady Statistic AS VS Percent: 1 %
Brady Statistic RA Percent Paced: 99 %
Brady Statistic RV Percent Paced: 5.6 %
Date Time Interrogation Session: 20190827061355
Implantable Lead Implant Date: 20170207
Implantable Lead Implant Date: 20170207
Implantable Lead Location: 753859
Implantable Lead Location: 753860
Implantable Pulse Generator Implant Date: 20170207
Lead Channel Impedance Value: 380 Ohm
Lead Channel Impedance Value: 390 Ohm
Lead Channel Pacing Threshold Amplitude: 0.75 V
Lead Channel Pacing Threshold Amplitude: 1.375 V
Lead Channel Pacing Threshold Pulse Width: 0.5 ms
Lead Channel Pacing Threshold Pulse Width: 0.5 ms
Lead Channel Sensing Intrinsic Amplitude: 2.1 mV
Lead Channel Sensing Intrinsic Amplitude: 6.5 mV
Lead Channel Setting Pacing Amplitude: 2.375
Lead Channel Setting Pacing Amplitude: 2.5 V
Lead Channel Setting Pacing Pulse Width: 0.5 ms
Lead Channel Setting Sensing Sensitivity: 2 mV
Pulse Gen Model: 2240
Pulse Gen Serial Number: 7866856

## 2018-01-07 ENCOUNTER — Other Ambulatory Visit: Payer: Self-pay | Admitting: Interventional Cardiology

## 2018-02-05 DIAGNOSIS — R0609 Other forms of dyspnea: Secondary | ICD-10-CM | POA: Diagnosis not present

## 2018-02-05 DIAGNOSIS — R350 Frequency of micturition: Secondary | ICD-10-CM | POA: Diagnosis not present

## 2018-02-05 DIAGNOSIS — I10 Essential (primary) hypertension: Secondary | ICD-10-CM | POA: Diagnosis not present

## 2018-02-05 DIAGNOSIS — I480 Paroxysmal atrial fibrillation: Secondary | ICD-10-CM | POA: Diagnosis not present

## 2018-02-05 DIAGNOSIS — I5032 Chronic diastolic (congestive) heart failure: Secondary | ICD-10-CM | POA: Diagnosis not present

## 2018-02-05 DIAGNOSIS — M545 Low back pain: Secondary | ICD-10-CM | POA: Diagnosis not present

## 2018-02-06 DIAGNOSIS — I480 Paroxysmal atrial fibrillation: Secondary | ICD-10-CM | POA: Diagnosis not present

## 2018-02-06 DIAGNOSIS — I5032 Chronic diastolic (congestive) heart failure: Secondary | ICD-10-CM | POA: Diagnosis not present

## 2018-02-20 DIAGNOSIS — J849 Interstitial pulmonary disease, unspecified: Secondary | ICD-10-CM | POA: Diagnosis not present

## 2018-02-20 DIAGNOSIS — Z23 Encounter for immunization: Secondary | ICD-10-CM | POA: Diagnosis not present

## 2018-02-20 DIAGNOSIS — E039 Hypothyroidism, unspecified: Secondary | ICD-10-CM | POA: Diagnosis not present

## 2018-02-20 DIAGNOSIS — I5032 Chronic diastolic (congestive) heart failure: Secondary | ICD-10-CM | POA: Diagnosis not present

## 2018-02-20 DIAGNOSIS — I509 Heart failure, unspecified: Secondary | ICD-10-CM | POA: Diagnosis not present

## 2018-02-20 DIAGNOSIS — I480 Paroxysmal atrial fibrillation: Secondary | ICD-10-CM | POA: Diagnosis not present

## 2018-02-20 DIAGNOSIS — N183 Chronic kidney disease, stage 3 (moderate): Secondary | ICD-10-CM | POA: Diagnosis not present

## 2018-02-20 DIAGNOSIS — I10 Essential (primary) hypertension: Secondary | ICD-10-CM | POA: Diagnosis not present

## 2018-02-20 DIAGNOSIS — K219 Gastro-esophageal reflux disease without esophagitis: Secondary | ICD-10-CM | POA: Diagnosis not present

## 2018-02-20 DIAGNOSIS — I2510 Atherosclerotic heart disease of native coronary artery without angina pectoris: Secondary | ICD-10-CM | POA: Diagnosis not present

## 2018-02-20 DIAGNOSIS — E7800 Pure hypercholesterolemia, unspecified: Secondary | ICD-10-CM | POA: Diagnosis not present

## 2018-02-20 DIAGNOSIS — F324 Major depressive disorder, single episode, in partial remission: Secondary | ICD-10-CM | POA: Diagnosis not present

## 2018-02-23 ENCOUNTER — Other Ambulatory Visit: Payer: Self-pay | Admitting: Interventional Cardiology

## 2018-02-24 ENCOUNTER — Other Ambulatory Visit: Payer: Self-pay | Admitting: Interventional Cardiology

## 2018-02-25 ENCOUNTER — Ambulatory Visit (INDEPENDENT_AMBULATORY_CARE_PROVIDER_SITE_OTHER): Payer: PPO

## 2018-02-25 ENCOUNTER — Encounter (HOSPITAL_COMMUNITY): Payer: Self-pay

## 2018-02-25 ENCOUNTER — Ambulatory Visit (HOSPITAL_COMMUNITY)
Admission: EM | Admit: 2018-02-25 | Discharge: 2018-02-25 | Disposition: A | Discharge status: Home / Self Care | Payer: PPO | Attending: Family Medicine | Admitting: Family Medicine

## 2018-02-25 ENCOUNTER — Other Ambulatory Visit: Payer: Self-pay

## 2018-02-25 DIAGNOSIS — I10 Essential (primary) hypertension: Secondary | ICD-10-CM

## 2018-02-25 DIAGNOSIS — R062 Wheezing: Secondary | ICD-10-CM

## 2018-02-25 DIAGNOSIS — R0602 Shortness of breath: Secondary | ICD-10-CM | POA: Diagnosis not present

## 2018-02-25 DIAGNOSIS — H6122 Impacted cerumen, left ear: Secondary | ICD-10-CM | POA: Diagnosis not present

## 2018-02-25 DIAGNOSIS — R05 Cough: Secondary | ICD-10-CM | POA: Diagnosis not present

## 2018-02-25 DIAGNOSIS — H6121 Impacted cerumen, right ear: Secondary | ICD-10-CM

## 2018-02-25 DIAGNOSIS — R059 Cough, unspecified: Secondary | ICD-10-CM

## 2018-02-25 MED ORDER — BENZONATATE 100 MG PO CAPS: 100.0000 mg | ORAL_CAPSULE | Freq: Three times a day (TID) | ORAL | 0 refills | Status: DC

## 2018-02-25 MED ORDER — PREDNISONE 10 MG (21) PO TBPK: ORAL_TABLET | Freq: Every day | ORAL | 0 refills | Status: DC

## 2018-02-25 NOTE — ED Triage Notes (Signed)
Pt c/o cough and congestion and wax in her right ear x 4 days.

## 2018-02-25 NOTE — ED Provider Notes (Signed)
[place]   [id number] 02/25/18 Arrival Time: [place]:  1. Cough   2. Wheezing   3. Impacted cerumen of right ear   4. Essential hypertension    No signs of sepsis. I have personally viewed the imaging studies ordered this visit. No signs of pneumonia.  Imaging: Dg Chest 2 View Result Date: 02/25/2018 IMPRESSION: 1. Diffuse interstitial prominence and peribronchial cuffing, concerning for an acute bronchitis. 2. Aortic atherosclerosis. Electronically Signed   By: [name] M.D.   On: 02/25/2018 16:42   Meds ordered this encounter  Medications  . predniSONE (STERAPRED UNI-PAK 21 TAB) 10 MG (21) TBPK tablet    Sig: Take by mouth daily. Take as directed.    Dispense:  21 tablet    Refill:  0  . benzonatate (TESSALON) 100 MG capsule    Sig: Take 1 capsule (100 mg total) by mouth every 8 (eight) hours.    Dispense:  21 capsule    Refill:  0   No indication for ED evaluation or hospital admission. Discussed with family present.  OTC symptom care as needed. Ensure adequate fluid intake and rest. May f/u with PCP or here as needed.  Follow-up Information    Wenda Low, MD.   Specialty:  Internal Medicine Why:  As needed. Contact information: [address] [place] [address] [phone]        [facility].   Specialty:  Urgent Care Why:  If symptoms worsen. Contact information: [place] [phone]         Hearing much better after cerumen removal. No signs of ear infection.  Reviewed expectations re: course of current medical issues. Questions answered. Outlined signs and symptoms indicating need for more acute intervention. Patient verbalized understanding. After Visit Summary given.   SUBJECTIVE: History from: patient and caregivers.  Efrat Zuidema Macha is a 84 y.o. female with a PMH of COPD and CHF, among other  problems, who presents with complaint of 'not feeling very well' for the past 3-4 days. Gradual onset of symptoms. Mild nasal congestion, post-nasal drainage. Family members with her today also reports a significant and persistent dry cough. Overall with mild fatigue and without body aches. SOB: with exertion but has some of this as her baseline. Wears O2 24 hours a day. Does not have this with her today. 'Can go without it for a little while.' Wheezing: moderate and sporadic. Albuterol nebs at home provide temporary relief. Fever: none reported or suspected. Overall normal PO intake without n/v. Appetite is slightly decreased. Sick contacts: none known. No specific or significant aggravating or alleviating factors reported. OTC treatment: none reported.  Received flu shot this year: yes.  Social History   Tobacco Use  Smoking Status Never Smoker  Smokeless Tobacco Never Used   Also reports a 'clogged right ear' for a few weeks. OTC Debrox without much help. No otalgia or drainage from ears. Feels hearing is normal.  ROS: As per HPI. All other systems negative.    OBJECTIVE:  Vitals:   02/25/18 1550 02/25/18 1555  BP:  (!) 183/55  Pulse:  63  Resp:  18  Temp:  98.2 F (36.8 C)  SpO2:  99%  Weight: 67.6 kg      General appearance: alert; appears somewhat fatigued HEENT: oropharynx moist; mild nasal congestion; conjunctivae with slight bilateral injection; L EAC with cerumen impaction (normal after flushing ear)  Neck: supple without LAD CV: RRR Lungs: unlabored respirations, symmetrical air entry with mild expiratory wheezing; question slight rales at lung bases; cough: mild; able to speak in full sentences Abd: soft; non-tender Ext: no LE edema Skin: warm and dry Psychological: alert and cooperative; normal mood and affect   Allergies  Allergen Reactions  . Penicillins Other (See Comments)    Unknown allergic reaction  . 2,4-D Dimethylamine (Amisol) Other (See Comments)  .  Ace Inhibitors Other (See Comments)  . Lanolin Hives  . Vancomycin     Red man syndrome  . Sulfonamide Derivatives Swelling and Rash    Past Medical History:  Diagnosis Date  . Bradycardia    a. Amiodarone DC'd 12/16 >> b. Holter 1/17 - NSR, sinus brady, junctional rhythm, pause 2.4 s  . C. difficile colitis 2015  . CAD (coronary artery disease)    never had cardiac cath, CAD dx by positive ant. wall defect on Nuc. study 2007 >>  no cath due to high risk/advanced age  . Chronic diastolic CHF (congestive heart failure) ([place])    a. Echo 6/15 - Mild LVH, EF 55-60%, mild AI, mild MR, moderate LAE, mild RAE, PASP 52 mm Hg  . CKD (chronic kidney disease)   . COPD (chronic obstructive pulmonary disease) ([place])   . Diverticulitis   . GERD (gastroesophageal reflux disease)   . Hypertension   . Macular degeneration   . PAF (paroxysmal atrial fibrillation) ([facility])    not optimal candidate for anticoagulation   Family History  Problem Relation Age of Onset  . Heart attack Father 36  . Heart attack Sister   . Cancer Brother   . Cancer Brother   . Cirrhosis Brother   . Heart disease Brother   . Cancer Brother   . COPD Brother   . COPD Sister   . Cancer Sister   . Cancer Sister   . Hypertension Daughter   . Stroke Neg Hx    Social History   Socioeconomic History  . Marital status: Widowed    Spouse name: Not on file  . Number of children: Not on file  . Years of education: Not on file  . Highest education level: Not on file  Occupational History  . Not on file  Social Needs  . Financial resource strain: Not on file  . Food insecurity:    Worry: Not on file    Inability: Not on file  . Transportation needs:    Medical: Not on file    Non-medical: Not on file  Tobacco Use  . Smoking status: Never Smoker  . Smokeless tobacco: Never Used  Substance and Sexual Activity  . Alcohol use: No    Alcohol/week: 0.0 standard drinks  . Drug use: No  . Sexual activity: Never    Lifestyle  . Physical activity:    Days per week: Not on file    Minutes per session: Not on file  . Stress: Not on file  Relationships  . Social connections:    Talks on phone: Not on file    Gets together: Not on file    Attends religious service: Not on file    Active member of club or organization: Not on file    Attends meetings of clubs or organizations: Not on file    Relationship status: Not on file  . Intimate partner violence:    Fear of current or ex partner: Not on file    Emotionally abused: Not on file  Physically abused: Not on file    Forced sexual activity: Not on file  Other Topics Concern  . Not on file  Social History Narrative        father died age 59: [place]         mother died age 52: gallbladder surgery complications         4 brothers: all deceased; prostate ca, throat ca, emphesema, MI, cirrhosis from ETOH         3 sisters: one living: MI, emphesema, 2 sisters with breast CA           Vanessa Kick, MD 02/26/18 [phone]

## 2018-02-26 ENCOUNTER — Telehealth: Payer: Self-pay | Admitting: *Deleted

## 2018-02-26 NOTE — Telephone Encounter (Signed)
Left message for pts daughter (P. Clyda Greener) to call back. (DPR) Adv to call back to schedule follow up with Dr. Irish Lack and after that we will make sure she has enough amiodarone and clopidogrel.

## 2018-02-27 NOTE — Telephone Encounter (Signed)
Left message for patient's daughter to call back. 

## 2018-03-04 ENCOUNTER — Telehealth: Payer: Self-pay | Admitting: Cardiology

## 2018-03-04 NOTE — Telephone Encounter (Signed)
LMOVM reminding pt to send remote transmission.   

## 2018-03-04 NOTE — Telephone Encounter (Signed)
Left message for patient's daughter to call back. 

## 2018-03-05 ENCOUNTER — Encounter: Payer: Self-pay | Admitting: Cardiology

## 2018-03-08 DIAGNOSIS — I5032 Chronic diastolic (congestive) heart failure: Secondary | ICD-10-CM | POA: Diagnosis not present

## 2018-03-08 DIAGNOSIS — I480 Paroxysmal atrial fibrillation: Secondary | ICD-10-CM | POA: Diagnosis not present

## 2018-03-10 ENCOUNTER — Ambulatory Visit (INDEPENDENT_AMBULATORY_CARE_PROVIDER_SITE_OTHER): Payer: PPO

## 2018-03-10 DIAGNOSIS — I495 Sick sinus syndrome: Secondary | ICD-10-CM | POA: Diagnosis not present

## 2018-03-11 ENCOUNTER — Other Ambulatory Visit: Payer: Self-pay | Admitting: Interventional Cardiology

## 2018-03-11 NOTE — Progress Notes (Signed)
Remote pacemaker transmission.   

## 2018-03-13 ENCOUNTER — Other Ambulatory Visit: Payer: Self-pay | Admitting: Interventional Cardiology

## 2018-03-13 MED ORDER — AMIODARONE HCL 100 MG PO TABS: 100.0000 mg | ORAL_TABLET | Freq: Every day | ORAL | 0 refills | Status: DC

## 2018-03-13 NOTE — Telephone Encounter (Signed)
New Message   *STAT* If patient is at the pharmacy, call can be transferred to refill team.   1. Which medications need to be refilled? (please list name of each medication and dose if known) amiodarone (PACERONE) 100 MG tablet  2. Which pharmacy/location (including street and city if local pharmacy) is medication to be sent to? [place], [address]  3. Do they need a 30 day or 90 day supply? [place]

## 2018-03-13 NOTE — Telephone Encounter (Signed)
Pt's medication was sent to pt's pharmacy as requested. Confirmation received.  °

## 2018-03-17 MED ORDER — AMIODARONE HCL 100 MG PO TABS: 100.0000 mg | ORAL_TABLET | Freq: Every day | ORAL | 0 refills | Status: DC

## 2018-03-17 MED ORDER — CLOPIDOGREL BISULFATE 75 MG PO TABS: ORAL_TABLET | ORAL | 0 refills | Status: DC

## 2018-03-17 NOTE — Addendum Note (Signed)
Addended by: Drue Novel I on: 03/17/2018 02:11 PM   Modules accepted: Orders

## 2018-03-17 NOTE — Telephone Encounter (Signed)
Patient has an appointment with Dr. Irish Lack on 06/19/18. Refills sent in for Plavix and Amiodarone.

## 2018-03-18 ENCOUNTER — Emergency Department (HOSPITAL_COMMUNITY): Payer: PPO

## 2018-03-18 ENCOUNTER — Encounter (HOSPITAL_COMMUNITY): Payer: Self-pay

## 2018-03-18 ENCOUNTER — Other Ambulatory Visit: Payer: Self-pay

## 2018-03-18 ENCOUNTER — Inpatient Hospital Stay (HOSPITAL_COMMUNITY)
Admission: EM | Admit: 2018-03-18 | Discharge: 2018-03-22 | DRG: 291 | Disposition: A | Discharge status: Home Health | Payer: PPO | Attending: Internal Medicine | Admitting: Internal Medicine

## 2018-03-18 DIAGNOSIS — J9621 Acute and chronic respiratory failure with hypoxia: Secondary | ICD-10-CM | POA: Diagnosis present

## 2018-03-18 DIAGNOSIS — I350 Nonrheumatic aortic (valve) stenosis: Secondary | ICD-10-CM | POA: Diagnosis not present

## 2018-03-18 DIAGNOSIS — I5033 Acute on chronic diastolic (congestive) heart failure: Secondary | ICD-10-CM | POA: Diagnosis present

## 2018-03-18 DIAGNOSIS — E785 Hyperlipidemia, unspecified: Secondary | ICD-10-CM | POA: Diagnosis present

## 2018-03-18 DIAGNOSIS — Z950 Presence of cardiac pacemaker: Secondary | ICD-10-CM | POA: Diagnosis not present

## 2018-03-18 DIAGNOSIS — K449 Diaphragmatic hernia without obstruction or gangrene: Secondary | ICD-10-CM | POA: Diagnosis present

## 2018-03-18 DIAGNOSIS — Z79890 Hormone replacement therapy (postmenopausal): Secondary | ICD-10-CM | POA: Diagnosis not present

## 2018-03-18 DIAGNOSIS — I2510 Atherosclerotic heart disease of native coronary artery without angina pectoris: Secondary | ICD-10-CM | POA: Diagnosis not present

## 2018-03-18 DIAGNOSIS — J40 Bronchitis, not specified as acute or chronic: Secondary | ICD-10-CM | POA: Diagnosis not present

## 2018-03-18 DIAGNOSIS — Z955 Presence of coronary angioplasty implant and graft: Secondary | ICD-10-CM | POA: Diagnosis not present

## 2018-03-18 DIAGNOSIS — Z79899 Other long term (current) drug therapy: Secondary | ICD-10-CM | POA: Diagnosis not present

## 2018-03-18 DIAGNOSIS — E872 Acidosis, unspecified: Secondary | ICD-10-CM | POA: Diagnosis present

## 2018-03-18 DIAGNOSIS — Z90710 Acquired absence of both cervix and uterus: Secondary | ICD-10-CM

## 2018-03-18 DIAGNOSIS — Z7902 Long term (current) use of antithrombotics/antiplatelets: Secondary | ICD-10-CM | POA: Diagnosis not present

## 2018-03-18 DIAGNOSIS — R531 Weakness: Secondary | ICD-10-CM | POA: Diagnosis not present

## 2018-03-18 DIAGNOSIS — I272 Pulmonary hypertension, unspecified: Secondary | ICD-10-CM | POA: Diagnosis present

## 2018-03-18 DIAGNOSIS — E876 Hypokalemia: Secondary | ICD-10-CM | POA: Diagnosis not present

## 2018-03-18 DIAGNOSIS — I130 Hypertensive heart and chronic kidney disease with heart failure and stage 1 through stage 4 chronic kidney disease, or unspecified chronic kidney disease: Principal | ICD-10-CM | POA: Diagnosis present

## 2018-03-18 DIAGNOSIS — I5032 Chronic diastolic (congestive) heart failure: Secondary | ICD-10-CM | POA: Diagnosis not present

## 2018-03-18 DIAGNOSIS — F0390 Unspecified dementia without behavioral disturbance: Secondary | ICD-10-CM | POA: Diagnosis present

## 2018-03-18 DIAGNOSIS — D509 Iron deficiency anemia, unspecified: Secondary | ICD-10-CM | POA: Diagnosis not present

## 2018-03-18 DIAGNOSIS — J849 Interstitial pulmonary disease, unspecified: Secondary | ICD-10-CM | POA: Diagnosis present

## 2018-03-18 DIAGNOSIS — R0602 Shortness of breath: Secondary | ICD-10-CM | POA: Diagnosis not present

## 2018-03-18 DIAGNOSIS — I480 Paroxysmal atrial fibrillation: Secondary | ICD-10-CM | POA: Diagnosis not present

## 2018-03-18 DIAGNOSIS — K219 Gastro-esophageal reflux disease without esophagitis: Secondary | ICD-10-CM | POA: Diagnosis not present

## 2018-03-18 DIAGNOSIS — I25118 Atherosclerotic heart disease of native coronary artery with other forms of angina pectoris: Secondary | ICD-10-CM | POA: Diagnosis not present

## 2018-03-18 DIAGNOSIS — I10 Essential (primary) hypertension: Secondary | ICD-10-CM | POA: Diagnosis not present

## 2018-03-18 DIAGNOSIS — N183 Chronic kidney disease, stage 3 unspecified: Secondary | ICD-10-CM | POA: Diagnosis present

## 2018-03-18 DIAGNOSIS — N179 Acute kidney failure, unspecified: Secondary | ICD-10-CM | POA: Diagnosis present

## 2018-03-18 DIAGNOSIS — G9341 Metabolic encephalopathy: Secondary | ICD-10-CM | POA: Diagnosis not present

## 2018-03-18 DIAGNOSIS — F329 Major depressive disorder, single episode, unspecified: Secondary | ICD-10-CM | POA: Diagnosis present

## 2018-03-18 DIAGNOSIS — E039 Hypothyroidism, unspecified: Secondary | ICD-10-CM | POA: Diagnosis present

## 2018-03-18 DIAGNOSIS — J441 Chronic obstructive pulmonary disease with (acute) exacerbation: Secondary | ICD-10-CM | POA: Diagnosis not present

## 2018-03-18 DIAGNOSIS — Z9981 Dependence on supplemental oxygen: Secondary | ICD-10-CM

## 2018-03-18 DIAGNOSIS — I959 Hypotension, unspecified: Secondary | ICD-10-CM | POA: Diagnosis not present

## 2018-03-18 DIAGNOSIS — F32A Depression, unspecified: Secondary | ICD-10-CM | POA: Diagnosis present

## 2018-03-18 DIAGNOSIS — T17920A Food in respiratory tract, part unspecified causing asphyxiation, initial encounter: Secondary | ICD-10-CM | POA: Diagnosis not present

## 2018-03-18 DIAGNOSIS — I361 Nonrheumatic tricuspid (valve) insufficiency: Secondary | ICD-10-CM | POA: Diagnosis not present

## 2018-03-18 DIAGNOSIS — R627 Adult failure to thrive: Secondary | ICD-10-CM | POA: Diagnosis present

## 2018-03-18 DIAGNOSIS — R05 Cough: Secondary | ICD-10-CM | POA: Diagnosis not present

## 2018-03-18 LAB — CBC WITH DIFFERENTIAL/PLATELET
Abs Immature Granulocytes: 0.03 10*3/uL (ref 0.00–0.07)
Basophils Absolute: 0.1 10*3/uL (ref 0.0–0.1)
Basophils Relative: 1 %
Eosinophils Absolute: 0.4 10*3/uL (ref 0.0–0.5)
Eosinophils Relative: 5 %
HCT: 35.1 % — ABNORMAL LOW (ref 36.0–46.0)
Hemoglobin: 11.3 g/dL — ABNORMAL LOW (ref 12.0–15.0)
Immature Granulocytes: 0 %
Lymphocytes Relative: 16 %
Lymphs Abs: 1.3 10*3/uL (ref 0.7–4.0)
MCH: 33 pg (ref 26.0–34.0)
MCHC: 32.2 g/dL (ref 30.0–36.0)
MCV: 102.6 fL — ABNORMAL HIGH (ref 80.0–100.0)
Monocytes Absolute: 0.8 10*3/uL (ref 0.1–1.0)
Monocytes Relative: 10 %
Neutro Abs: 5.5 10*3/uL (ref 1.7–7.7)
Neutrophils Relative %: 68 %
Platelets: 181 10*3/uL (ref 150–400)
RBC: 3.42 MIL/uL — ABNORMAL LOW (ref 3.87–5.11)
RDW: 12.2 % (ref 11.5–15.5)
WBC: 8.1 10*3/uL (ref 4.0–10.5)
nRBC: 0 % (ref 0.0–0.2)

## 2018-03-18 LAB — BASIC METABOLIC PANEL
Anion gap: 14 (ref 5–15)
BUN: 10 mg/dL (ref 8–23)
CO2: 28 mmol/L (ref 22–32)
Calcium: 9 mg/dL (ref 8.9–10.3)
Chloride: 98 mmol/L (ref 98–111)
Creatinine, Ser: 1.01 mg/dL — ABNORMAL HIGH (ref 0.44–1.00)
GFR calc Af Amer: 54 mL/min — ABNORMAL LOW (ref >60)
GFR calc non Af Amer: 46 mL/min — ABNORMAL LOW (ref >60)
Glucose, Bld: 107 mg/dL — ABNORMAL HIGH (ref 70–99)
Potassium: 2.9 mmol/L — ABNORMAL LOW (ref 3.5–5.1)
Sodium: 140 mmol/L (ref 135–145)

## 2018-03-18 LAB — TROPONIN I: Troponin I: <0.03 ng/mL (ref <0.03)

## 2018-03-18 LAB — BRAIN NATRIURETIC PEPTIDE: B Natriuretic Peptide: 141.3 pg/mL — ABNORMAL HIGH (ref 0.0–100.0)

## 2018-03-18 LAB — I-STAT CG4 LACTIC ACID, ED
Lactic Acid, Venous: 2.04 mmol/L (ref 0.5–1.9)
Lactic Acid, Venous: 2.39 mmol/L (ref 0.5–1.9)

## 2018-03-18 MED ORDER — POTASSIUM CHLORIDE 20 MEQ/15ML (10%) PO SOLN
40.0000 meq | Freq: Once | ORAL | Status: AC
  Administered 2018-03-18: 40 meq via ORAL
  Filled 2018-03-18: qty 30

## 2018-03-18 MED ORDER — IOPAMIDOL (ISOVUE-370) INJECTION 76%
80.0000 mL | Freq: Once | INTRAVENOUS | Status: AC | PRN
  Administered 2018-03-18: 80 mL via INTRAVENOUS

## 2018-03-18 MED ORDER — ALBUTEROL (5 MG/ML) CONTINUOUS INHALATION SOLN
10.0000 mg/h | INHALATION_SOLUTION | RESPIRATORY_TRACT | Status: AC
  Administered 2018-03-18: 10 mg/h via RESPIRATORY_TRACT
  Filled 2018-03-18: qty 20

## 2018-03-18 MED ORDER — MAGNESIUM SULFATE IN D5W 1-5 GM/100ML-% IV SOLN
1.0000 g | Freq: Once | INTRAVENOUS | Status: AC
  Administered 2018-03-18: 1 g via INTRAVENOUS
  Filled 2018-03-18: qty 100

## 2018-03-18 MED ORDER — IOPAMIDOL (ISOVUE-370) INJECTION 76%
INTRAVENOUS | Status: AC
  Filled 2018-03-18: qty 100

## 2018-03-18 MED ORDER — SODIUM CHLORIDE (PF) 0.9 % IJ SOLN
INTRAMUSCULAR | Status: AC
  Administered 2018-03-19: 01:00:00
  Filled 2018-03-18: qty 50

## 2018-03-18 MED ORDER — METHYLPREDNISOLONE SODIUM SUCC 125 MG IJ SOLR
125.0000 mg | Freq: Once | INTRAMUSCULAR | Status: AC
  Administered 2018-03-18: 125 mg via INTRAVENOUS
  Filled 2018-03-18: qty 2

## 2018-03-18 MED ORDER — POTASSIUM CHLORIDE 10 MEQ/100ML IV SOLN
10.0000 meq | INTRAVENOUS | Status: AC
  Administered 2018-03-18 – 2018-03-19 (×2): 10 meq via INTRAVENOUS
  Filled 2018-03-18 (×2): qty 100

## 2018-03-18 NOTE — ED Triage Notes (Signed)
Pt BIB EMS from home.Pt has been diagnosed with bronchitis and has been on medications. According to family the patient got choked up today and became [facility]. EMS heard expiratory wheezes and gave her albuterol and Atrovent treatment.   20G LAC

## 2018-03-18 NOTE — ED Notes (Signed)
Slowed IV Potassium rate due to pain in patients L arm around IV site.

## 2018-03-18 NOTE — ED Provider Notes (Signed)
Medical screening examination/treatment/procedure(s) were conducted as a shared visit with non-physician practitioner(s) and myself.  I personally evaluated the patient during the encounter.  4-6 weeks of symptoms of coughing and congestion. Also with some choking on mucus.  Exam with diffuse wheezing. Crackles in RLL. Diminished bilateral lower lobes.  Bronchitis? Possibly pulmonary edema? Add on trop/bnp/ecg.    None     Tricha Ruggirello, Corene Cornea, MD 03/19/18 2302

## 2018-03-18 NOTE — ED Notes (Signed)
Patient transported to CT 

## 2018-03-18 NOTE — H&P (Signed)
History and Physical    Laura Zuniga YQI:[id number] DOB: 28-Jan-1918 DOA: 03/18/2018  Referring MD/NP/PA:   PCP: Wenda Low, MD   Patient coming from:  The patient is coming from home.  At baseline, pt is dependent for most of ADL.        Chief Complaint: SOB  HPI: Laura Zuniga is a 84 y.o. female with medical history significant of hypertension, hyperlipidemia, ILD, COPD, GERD, hypothyroidism, depression, bradycardia, C. difficile colitis, CAD, stent placement, dCHF, CKD 3, PAF not on anticoagulants, iron deficiency anemia, pacemaker placement due to sinus node dysfunction, who presents with shortness of breath.  Per patient's daughter, patient has been having shortness of breath for more than 6 weeks, which has worsened in the past 2 weeks.  She has wheezing and cough, mostly dry cough, no chest pain, fever or chills.  Patient was treated with antibiotics and prednisone for 3 times by PCP, but problem comes back again after she had chocking episode per her daughter.  Patient does not have nausea, vomiting, diarrhea, abdominal pain, symptoms of UTI.  Patient had mild confusion earlier, which improved.  Currently patient is oriented x3.  Patient moves all extremities normally.  No unilateral weakness in extremities, no facial droop or slurred speech.  Denies symptoms of UTI.  ED Course: pt was found to have WBC 8.1, lactic acid 2.04, 2.39, negative troponin, BNP 141.3, pending urinalysis, potassium 2.9, stable renal function, temperature normal, no tachycardia, has tachypnea, oxygen saturation 99% on room air.  Chest x-ray showed left small amount of a pleural effusion in the left basilar atelectasis.  CT angiogram chest is negative for PE, but showed mild interstitial edema.  Patient is placed on telemetry bed for observation.  Review of Systems:   General: no fevers, chills, no body weight gain, has poor appetite, has fatigue HEENT: no blurry vision, hearing changes or sore  throat Respiratory: Has dyspnea, coughing, wheezing CV: no chest pain, no palpitations GI: no nausea, vomiting, abdominal pain, diarrhea, constipation GU: no dysuria, burning on urination, increased urinary frequency, hematuria  Ext: Has trace leg edema Neuro: no unilateral weakness, numbness, or tingling, no vision change or hearing loss Skin: no rash, no skin tear. MSK: No muscle spasm, no deformity, no limitation of range of movement in spin Heme: No easy bruising.  Travel history: No recent long distant travel.  Allergy:  Allergies  Allergen Reactions  . Penicillins Other (See Comments)    Unknown allergic reaction Has patient had a PCN reaction causing immediate rash, facial/tongue/throat swelling, SOB or lightheadedness with hypotension: unknown Has patient had a PCN reaction causing severe rash involving mucus membranes or skin necrosis: unknown Has patient had a PCN reaction that required hospitalization: unknown Has patient had a PCN reaction occurring within the last 10 years: unknown If all of the above answers are "NO", then may proceed with Cephalosporin use.   . 2,4-D Dimethylamine (Amisol) Other (See Comments)  . Ace Inhibitors Other (See Comments)  . Lanolin Hives  . Sulfa Antibiotics   . Vancomycin     Red man syndrome  . Sulfonamide Derivatives Swelling and Rash    Past Medical History:  Diagnosis Date  . Bradycardia    a. Amiodarone DC'd 12/16 >> b. Holter 1/17 - NSR, sinus brady, junctional rhythm, pause 2.4 s  . C. difficile colitis 2015  . CAD (coronary artery disease)    never had cardiac cath, CAD dx by positive ant. wall defect on Nuc. study 2007 >>  no cath due to high risk/advanced age  . Chronic diastolic CHF (congestive heart failure) ([place])    a. Echo 6/15 - Mild LVH, EF 55-60%, mild AI, mild MR, moderate LAE, mild RAE, PASP 52 mm Hg  . CKD (chronic kidney disease)   . COPD (chronic obstructive pulmonary disease) ([place])   . Diverticulitis   .  GERD (gastroesophageal reflux disease)   . Hypertension   . Macular degeneration   . PAF (paroxysmal atrial fibrillation) (HCC)    not optimal candidate for anticoagulation    Past Surgical History:  Procedure Laterality Date  . APPENDECTOMY    . CHOLECYSTECTOMY    . EP IMPLANTABLE DEVICE N/A 05/17/2015   Procedure: Pacemaker Implant;  Surgeon: Evans Lance, MD;  Location: [facility];  Service: Cardiovascular;  Laterality: N/A;  . ESOPHAGOGASTRODUODENOSCOPY N/A 09/22/2013   Procedure: ESOPHAGOGASTRODUODENOSCOPY (EGD);  Surgeon: Lear Ng, MD;  Location: [facility] ENDOSCOPY;  Service: Endoscopy;  Laterality: N/A;  . ESOPHAGOGASTRODUODENOSCOPY N/A 09/22/2013   Procedure: ESOPHAGOGASTRODUODENOSCOPY (EGD);  Surgeon: Lear Ng, MD;  Location: [facility] ENDOSCOPY;  Service: Endoscopy;  Laterality: N/A;  . EYE SURGERY    . FLEXIBLE SIGMOIDOSCOPY N/A 09/22/2013   Procedure: FLEXIBLE SIGMOIDOSCOPY;  Surgeon: Lear Ng, MD;  Location: [facility] ENDOSCOPY;  Service: Endoscopy;  Laterality: N/A;  . FLEXIBLE SIGMOIDOSCOPY N/A 09/22/2013   Procedure: FLEXIBLE SIGMOIDOSCOPY;  Surgeon: Lear Ng, MD;  Location: [facility] ENDOSCOPY;  Service: Endoscopy;  Laterality: N/A;  unprepped/ egd first  . VAGINAL HYSTERECTOMY      Social History:  reports that she has never smoked. She has never used smokeless tobacco. She reports that she does not drink alcohol or use drugs.  Family History:  Family History  Problem Relation Age of Onset  . Heart attack Father 57  . Heart attack Sister   . Cancer Brother   . Cancer Brother   . Cirrhosis Brother   . Heart disease Brother   . Cancer Brother   . COPD Brother   . COPD Sister   . Cancer Sister   . Cancer Sister   . Hypertension Daughter   . Stroke Neg Hx      Prior to Admission medications   Medication Sig Start Date End Date Taking? Authorizing Provider  acetaminophen (TYLENOL) 500 MG tablet Take 2 tablets (1,000 mg total) by mouth  every 6 (six) hours as needed for mild pain or moderate pain. 05/27/16  Yes Duffy Bruce, MD  albuterol (PROVENTIL HFA;VENTOLIN HFA) 108 (90 BASE) MCG/ACT inhaler Inhale 2 puffs into the lungs every 6 (six) hours as needed for wheezing or shortness of breath. 02/23/15  Yes Deloria Lair, NP  amiodarone (PACERONE) 100 MG tablet Take 1 tablet (100 mg total) by mouth daily. Keep appointment in March 2020 03/17/18  Yes Jettie Booze, MD  Biotin 1 MG CAPS Take 1 mg by mouth daily.   Yes [provider]  Carboxymethylcellulose Sodium (EYE DROPS OP) Place 1 drop into both eyes daily as needed (dry eyes).   Yes [provider]  clopidogrel (PLAVIX) 75 MG tablet TAKE 1 TABLET BY MOUTH ONCE DAILY. Please make overdue appt with Dr. Irish Lack before anymore refills. 1st attempt 03/17/18  Yes Jettie Booze, MD  Cranberry 250 MG CAPS Take 250 mg by mouth daily after lunch.    Yes [provider]  docusate sodium (COLACE) 100 MG capsule Take 100 mg by mouth daily as needed for mild constipation.   Yes [provider]  EQ CLEARLAX powder Take 17 g by mouth daily as needed for constipation. 01/08/18  Yes [provider]  escitalopram (LEXAPRO) 10 MG tablet Take 15 mg daily by mouth.    Yes [provider]  famotidine (PEPCID) 20 MG tablet Take 20 mg by mouth daily. 02/20/18  Yes [provider]  ferrous sulfate 325 (65 FE) MG tablet Take 325 mg by mouth daily after lunch.    Yes [provider]  furosemide (LASIX) 80 MG tablet Take 1 tablet by mouth in the morning, 1/2 tablet (40 mg)  in the evening and can take 1/2 tablet daily as needed for swelling   Yes [provider]  hydrALAZINE (APRESOLINE) 25 MG tablet TAKE 1 TABLET BY MOUTH TWICE DAILY Patient taking differently: Take 25 mg by mouth. 2 to 3  Times daily 05/10/17  Yes Jettie Booze, MD  isosorbide mononitrate (IMDUR) 60 MG 24 hr tablet TAKE 1 TABLET BY MOUTH  ONCE DAILY 01/07/18  Yes Jettie Booze, MD  levothyroxine (SYNTHROID, LEVOTHROID) 50 MCG tablet Take 50 mcg by mouth daily before breakfast.  06/04/14  Yes [provider]  Multiple Vitamins-Minerals (ICAPS AREDS 2 PO) Take 1 tablet by mouth daily.   Yes [provider]  pantoprazole (PROTONIX) 40 MG tablet Take 40 mg by mouth daily after lunch.    Yes [provider]  potassium chloride SA (K-DUR,KLOR-CON) 20 MEQ tablet Take 20 mEq by mouth 2 (two) times daily.  05/20/14  Yes [provider]  pravastatin (PRAVACHOL) 40 MG tablet Take 40 mg daily by mouth. 01/07/17  Yes [provider]  vitamin E 400 UNIT capsule Take 400 Units by mouth daily after lunch.   Yes [provider]  benzonatate (TESSALON) 100 MG capsule Take 1 capsule (100 mg total) by mouth every 8 (eight) hours. Patient not taking: Reported on 03/18/2018 02/25/18   Vanessa Kick, MD  predniSONE (STERAPRED UNI-PAK 21 TAB) 10 MG (21) TBPK tablet Take by mouth daily. Take as directed. Patient not taking: Reported on 03/18/2018 02/25/18   Vanessa Kick, MD    Physical Exam: Vitals:   03/19/18 0230 03/19/18 0301 03/19/18 0421 03/19/18 0555  BP: 106/89 (!) 131/45 (!) 143/58 (!) 142/57  Pulse: 68 69 69 69  Resp: 15 15 20 20   Temp:      SpO2: 98% 94% 97% 96%  Weight:       General: Not in acute distress HEENT:       Eyes: PERRL, EOMI, no scleral icterus.       ENT: No discharge from the ears and nose, no pharynx injection, no tonsillar enlargement.        Neck: No JVD, no bruit, no mass felt. Heme: No neck lymph node enlargement. Cardiac: S1/S2, RRR, No murmurs, No gallops or rubs. Respiratory: Has diffused wheezing bilaterally. GI: Soft, nondistended, nontender, no rebound pain, no organomegaly, BS present. GU: No hematuria Ext: Has trace leg edema bilaterally. 2+DP/PT pulse bilaterally. Musculoskeletal: No joint deformities, No joint redness or warmth, no limitation of  ROM in spin. Skin: No rashes.  Neuro: Alert, oriented X3, cranial nerves II-XII grossly intact, moves all extremities normally Psych: Patient is not psychotic, no suicidal or hemocidal ideation.  Labs on Admission: I have personally reviewed following labs and imaging studies  CBC: Recent Labs  Lab 03/18/18 1829  WBC 8.1  NEUTROABS 5.5  HGB 11.3*  HCT 35.1*  MCV 102.6*  PLT 181   Basic  Metabolic Panel: Recent Labs  Lab 03/18/18 1829  NA 140  K 2.9*  CL 98  CO2 28  GLUCOSE 107*  BUN 10  CREATININE 1.01*  CALCIUM 9.0   GFR: CrCl cannot be calculated (Unknown ideal weight.). Liver Function Tests: No results for input(s): AST, ALT, ALKPHOS, BILITOT, PROT, ALBUMIN in the last 168 hours. No results for input(s): LIPASE, AMYLASE in the last 168 hours. No results for input(s): AMMONIA in the last 168 hours. Coagulation Profile: No results for input(s): INR, PROTIME in the last 168 hours. Cardiac Enzymes: Recent Labs  Lab 03/18/18 1829  TROPONINI <0.03   BNP (last 3 results) No results for input(s): PROBNP in the last 8760 hours. HbA1C: No results for input(s): HGBA1C in the last 72 hours. CBG: No results for input(s): GLUCAP in the last 168 hours. Lipid Profile: No results for input(s): CHOL, HDL, LDLCALC, TRIG, CHOLHDL, LDLDIRECT in the last 72 hours. Thyroid Function Tests: No results for input(s): TSH, T4TOTAL, FREET4, T3FREE, THYROIDAB in the last 72 hours. Anemia Panel: No results for input(s): VITAMINB12, FOLATE, FERRITIN, TIBC, IRON, RETICCTPCT in the last 72 hours. Urine analysis:    Component Value Date/Time   COLORURINE YELLOW 07/03/2016 0600   APPEARANCEUR HAZY (A) 07/03/2016 0600   LABSPEC 1.011 07/03/2016 0600   PHURINE 5.0 07/03/2016 0600   GLUCOSEU NEGATIVE 07/03/2016 0600   HGBUR NEGATIVE 07/03/2016 0600   BILIRUBINUR NEGATIVE 07/03/2016 0600   KETONESUR NEGATIVE 07/03/2016 0600   PROTEINUR NEGATIVE 07/03/2016 0600   UROBILINOGEN 0.2  07/01/2016 1556   NITRITE NEGATIVE 07/03/2016 0600   LEUKOCYTESUR SMALL (A) 07/03/2016 0600   Sepsis Labs: @LABRCNTIP (procalcitonin:4,lacticidven:4) )No results found for this or any previous visit (from the past 240 hour(s)).   Radiological Exams on Admission: Dg Chest 2 View  Result Date: 03/18/2018 CLINICAL DATA:  Cough EXAM: CHEST - 2 VIEW COMPARISON:  February 25, 2018 FINDINGS: There is a small left pleural effusion with mild left base atelectasis. There is no frank edema or consolidation. Heart is upper normal in size with pulmonary vascularity normal. Pacemaker leads are attached to the right atrium right ventricle. There is aortic atherosclerosis. No adenopathy. No bone lesions. IMPRESSION: Small left pleural effusion with left base atelectasis. No edema or consolidation. Stable cardiac silhouette. Pacemaker leads attached to right atrium and right ventricle. There is aortic atherosclerosis. Aortic Atherosclerosis (ICD10-I70.0). Electronically Signed   By: Lowella Grip III M.D.   On: 03/18/2018 20:01   Ct Angio Chest Pe W/cm &/or Wo Cm  Result Date: 03/18/2018 CLINICAL DATA:  Shortness of breath. EXAM: CT ANGIOGRAPHY CHEST WITH CONTRAST TECHNIQUE: Multidetector CT imaging of the chest was performed using the standard protocol during bolus administration of intravenous contrast. Multiplanar CT image reconstructions and MIPs were obtained to evaluate the vascular anatomy. CONTRAST:  41mL ISOVUE-370 IOPAMIDOL (ISOVUE-370) INJECTION 76% COMPARISON:  Chest radiographs obtained earlier today. Chest CT dated 01/19/2016. FINDINGS: Cardiovascular: Normally opacified pulmonary arteries with no pulmonary arterial filling defects seen. Atheromatous calcifications, including the coronary arteries and aorta. Enlarged heart due to left atrial and left ventricular enlargement. Mediastinum/Nodes: No enlarged mediastinal, hilar, or axillary lymph nodes. Thyroid gland, trachea, and esophagus demonstrate  no significant findings. Small hiatal hernia. Lungs/Pleura: Minimal bilateral dependent atelectasis. Interval minimal patchy prominence of the interstitial markings. Small calcified granulomata in both lower lobes. Prominent epicardial and extrapleural fat on the left, simulating the small left pleural effusion suspected on the radiographs earlier today. No pleural fluid is seen. Upper Abdomen: Cholecystectomy clips. Atheromatous  arterial calcifications. Partially included right renal cyst. Musculoskeletal: Thoracic spine degenerative changes and mild scoliosis. Review of the MIP images confirms the above findings. IMPRESSION: 1. No pulmonary emboli. 2. Minimal interstitial pulmonary edema. 3.  Calcific coronary artery and aortic atherosclerosis. Aortic Atherosclerosis (ICD10-I70.0). Electronically Signed   By: Claudie Revering M.D.   On: 03/18/2018 22:10     EKG: Independently reviewed.  Paced rhythm, QTC 650  Assessment/Plan Principal Problem:   COPD exacerbation (HCC) Active Problems:   HLD (hyperlipidemia)   Depression   Essential hypertension   CAD (coronary artery disease)   PAF (paroxysmal atrial fibrillation) (HCC)   Chronic diastolic heart failure (HCC)   GERD (gastroesophageal reflux disease)   CKD (chronic kidney disease), stage III (HCC)   Cardiac pacemaker   ILD (interstitial lung disease) (HCC)   Hypothyroidism   Hypokalemia   Lactic acidosis   COPD exacerbation in the setting of ILD: Patient has cough, shortness of breath and wheezing on auscultation, consistent with COPD exacerbation.  No infiltration on chest x-ray.  per patient's daughter, patient had choking episode, will need to rule out dysphagia.  -will place on tele bed for obs -Nebulizers: duonebs and albuterol nebs prn Nebs -Solu-Medrol 60 mg IV tid -Z pak -Mucinex for cough  -Incentive spirometry -Follow up sputum culture and flu PCR -Nasal cannula oxygen as neededto maintain O2 saturation 92% or  greater -neek pt NPO until SLP done  HLD (hyperlipidemia): -Pravastatin  HTN:  -Continue home medications: Hydralazine -Patient is also on Lasix -IV hydralazine prn  CAD: s/p of stent. No CP -Continue Plavix, pravastatin, Imdur  Atrial Fibrillation: CHA2DS2-VASc Score is 6, needs oral anticoagulation, but pt is not on AC at home, not sure why. Heart rate is well controlled. -continue amiodarone  Chronic diastolic heart failure: 2D echo on 09/28/2013 showed EF of 55-60%.  BNP 141.3.  Chest x-ray has no pulmonary edema, but CT angiogram showed mild interstitial edema.  Patient has trace leg edema but no JVD.  Does not seem to have CHF exacerbation. - Adjust home dose Lasix from 40 mg in AM - 20 mg in PM to 40 mg twice daily  Depression: -Lexapro  GERD: -Protonix -Pepcid  CKD (chronic kidney disease), stage III ([place]): stable. Cre 1.01 -f/u by BMP  Hypothyroidism: Last TSH was 1.580 on 07/03/16 -Continue home Synthroid  Hypokalemia: K= 2.9 on admission. - Repleted - Give 1 g of magnesium sulfate  Lactic acidosis: Lactic acid 2.04, 2.39.  No fever or leukocytosis.  Does not seem to have sepsis. -trend lactic acid levels    DVT ppx:  SQ Lovenox Code Status: Full code Family Communication:  Yes, patient's daughter at bed side Disposition Plan:  Anticipate discharge back to previous home environment Consults called:  none Admission status: Obs / tele    Date of Service 03/19/2018    Ivor Costa [facility] Pager [phone]  If 7PM-7AM, please contact night-coverage [email or web address] Password [facility] 03/19/2018, 6:25 AM

## 2018-03-18 NOTE — ED Provider Notes (Signed)
[place] [facility] DEPT Provider Note   CSN: [id number] Arrival date & time: 03/18/18  7564     History   Chief Complaint Chief Complaint  Patient presents with  . Shortness of Breath    HPI Laura Zuniga is a 84 y.o. female.  HPI Patient presents to the emergency department with shortness of breath and coughing that is been ongoing over the last 6 weeks.  The patient got worse over the last 3 to 4 weeks.  Patient has seen the urgent care and her doctor twice.  They were given nebulized treatments because of wheezing last week which have not improved her symptoms.  The patient did get somewhat confused earlier today.  Patient is a very alert well-appearing 84 year old lady other than her current issues.  Patient states that she has no pain.  The patient denies chest pain, headache,blurred vision, neck pain, fever,weakness, numbness, dizziness, anorexia, edema, abdominal pain, nausea, vomiting, diarrhea, rash, back pain, dysuria, hematemesis, bloody stool, near syncope, or syncope. Past Medical History:  Diagnosis Date  . Bradycardia    a. Amiodarone DC'd 12/16 >> b. Holter 1/17 - NSR, sinus brady, junctional rhythm, pause 2.4 s  . C. difficile colitis 2015  . CAD (coronary artery disease)    never had cardiac cath, CAD dx by positive ant. wall defect on Nuc. study 2007 >>  no cath due to high risk/advanced age  . Chronic diastolic CHF (congestive heart failure) ([place])    a. Echo 6/15 - Mild LVH, EF 55-60%, mild AI, mild MR, moderate LAE, mild RAE, PASP 52 mm Hg  . CKD (chronic kidney disease)   . COPD (chronic obstructive pulmonary disease) ([place])   . Diverticulitis   . GERD (gastroesophageal reflux disease)   . Hypertension   . Macular degeneration   . PAF (paroxysmal atrial fibrillation) ([place])    not optimal candidate for anticoagulation    Patient Active Problem List   Diagnosis Date Noted  . Sepsis ([place]) 07/03/2016  . Abnormal urinalysis  07/03/2016  . Dysphagia (mild-tolerates regular/thin liquids w/ swallow techniques) 07/03/2016  . Pulmonary HTN ([place]) 07/03/2016  . Transaminitis 07/03/2016  . Hypothyroidism 07/03/2016  . Elevated liver function tests   . UTI (urinary tract infection) with pyuria   . ILD (interstitial lung disease) ([place]) 02/17/2016  . Chronic cough 02/17/2016  . Cardiac pacemaker 02/16/2016  . Sinus node dysfunction ([place]) 05/05/2015  . CKD (chronic kidney disease), stage III ([place]) 09/14/2014  . GERD (gastroesophageal reflux disease) 10/29/2013  . Hyponatremia 10/21/2013  . Acute blood loss anemia 10/01/2013  . Urinary retention 10/01/2013  . Chronic diastolic heart failure ([place]) 09/28/2013  . PAF (paroxysmal atrial fibrillation) ([place]) 09/27/2013  . Melena 09/22/2013  . ATN (acute tubular necrosis) ([place]) 09/21/2013  . Nonspecific abnormal results of cardiovascular function study 06/11/2013  . Syncope 06/28/2011  . Swelling of left lower extremity 06/28/2011  . DEMENTIA, CCE, W/O BEHAVIORAL DISTURBANCE 01/17/2007  . RENAL STONE 12/04/2006  . HLD (hyperlipidemia) 07/15/2006  . DEPRESSION 07/15/2006  . BLINDNESS, LEFT EYE 07/15/2006  . Essential hypertension 07/15/2006  . CAD (coronary artery disease) 07/15/2006  . OSTEOPOROSIS 07/15/2006    Past Surgical History:  Procedure Laterality Date  . APPENDECTOMY    . CHOLECYSTECTOMY    . EP IMPLANTABLE DEVICE N/A 05/17/2015   Procedure: Pacemaker Implant;  Surgeon: Evans Lance, MD;  Location: [facility] CV LAB;  Service: Cardiovascular;  Laterality: N/A;  . ESOPHAGOGASTRODUODENOSCOPY N/A 09/22/2013   Procedure: ESOPHAGOGASTRODUODENOSCOPY (  EGD);  Surgeon: Lear Ng, MD;  Location: [facility] ENDOSCOPY;  Service: Endoscopy;  Laterality: N/A;  . ESOPHAGOGASTRODUODENOSCOPY N/A 09/22/2013   Procedure: ESOPHAGOGASTRODUODENOSCOPY (EGD);  Surgeon: Lear Ng, MD;  Location: [facility] ENDOSCOPY;  Service: Endoscopy;  Laterality: N/A;  . EYE SURGERY    .  FLEXIBLE SIGMOIDOSCOPY N/A 09/22/2013   Procedure: FLEXIBLE SIGMOIDOSCOPY;  Surgeon: Lear Ng, MD;  Location: [facility] ENDOSCOPY;  Service: Endoscopy;  Laterality: N/A;  . FLEXIBLE SIGMOIDOSCOPY N/A 09/22/2013   Procedure: FLEXIBLE SIGMOIDOSCOPY;  Surgeon: Lear Ng, MD;  Location: [facility] ENDOSCOPY;  Service: Endoscopy;  Laterality: N/A;  unprepped/ egd first  . VAGINAL HYSTERECTOMY       OB History   None      Home Medications    Prior to Admission medications   Medication Sig Start Date End Date Taking? Authorizing Provider  acetaminophen (TYLENOL) 500 MG tablet Take 2 tablets (1,000 mg total) by mouth every 6 (six) hours as needed for mild pain or moderate pain. 05/27/16  Yes Duffy Bruce, MD  albuterol (PROVENTIL HFA;VENTOLIN HFA) 108 (90 BASE) MCG/ACT inhaler Inhale 2 puffs into the lungs every 6 (six) hours as needed for wheezing or shortness of breath. 02/23/15  Yes Deloria Lair, NP  amiodarone (PACERONE) 100 MG tablet Take 1 tablet (100 mg total) by mouth daily. Keep appointment in March 2020 03/17/18  Yes Jettie Booze, MD  Biotin 1 MG CAPS Take 1 mg by mouth daily.   Yes [provider]  Carboxymethylcellulose Sodium (EYE DROPS OP) Place 1 drop into both eyes daily as needed (dry eyes).   Yes [provider]  clopidogrel (PLAVIX) 75 MG tablet TAKE 1 TABLET BY MOUTH ONCE DAILY. Please make overdue appt with Dr. Irish Lack before anymore refills. 1st attempt 03/17/18  Yes Jettie Booze, MD  Cranberry 250 MG CAPS Take 250 mg by mouth daily after lunch.    Yes [provider]  docusate sodium (COLACE) 100 MG capsule Take 100 mg by mouth daily as needed for mild constipation.   Yes [provider]  EQ CLEARLAX powder Take 17 g by mouth daily as needed for constipation. 01/08/18  Yes [provider]  escitalopram (LEXAPRO) 10 MG tablet Take 15 mg daily by mouth.    Yes [provider]  famotidine (PEPCID) 20  MG tablet Take 20 mg by mouth daily. 02/20/18  Yes [provider]  ferrous sulfate 325 (65 FE) MG tablet Take 325 mg by mouth daily after lunch.    Yes [provider]  furosemide (LASIX) 80 MG tablet Take 1 tablet by mouth in the morning, 1/2 tablet (40 mg)  in the evening and can take 1/2 tablet daily as needed for swelling   Yes [provider]  hydrALAZINE (APRESOLINE) 25 MG tablet TAKE 1 TABLET BY MOUTH TWICE DAILY Patient taking differently: Take 25 mg by mouth. 2 to 3  Times daily 05/10/17  Yes Jettie Booze, MD  isosorbide mononitrate (IMDUR) 60 MG 24 hr tablet TAKE 1 TABLET BY MOUTH ONCE DAILY 01/07/18  Yes Jettie Booze, MD  levothyroxine (SYNTHROID, LEVOTHROID) 50 MCG tablet Take 50 mcg by mouth daily before breakfast.  06/04/14  Yes [provider]  Multiple Vitamins-Minerals (ICAPS AREDS 2 PO) Take 1 tablet by mouth daily.   Yes [provider]  pantoprazole (PROTONIX) 40 MG tablet Take 40 mg by mouth daily after lunch.    Yes [provider]  potassium chloride SA (K-DUR,KLOR-CON)  20 MEQ tablet Take 20 mEq by mouth 2 (two) times daily.  05/20/14  Yes [provider]  pravastatin (PRAVACHOL) 40 MG tablet Take 40 mg daily by mouth. 01/07/17  Yes [provider]  vitamin E 400 UNIT capsule Take 400 Units by mouth daily after lunch.   Yes [provider]  benzonatate (TESSALON) 100 MG capsule Take 1 capsule (100 mg total) by mouth every 8 (eight) hours. Patient not taking: Reported on 03/18/2018 02/25/18   Vanessa Kick, MD  predniSONE (STERAPRED UNI-PAK 21 TAB) 10 MG (21) TBPK tablet Take by mouth daily. Take as directed. Patient not taking: Reported on 03/18/2018 02/25/18   Vanessa Kick, MD    Family History Family History  Problem Relation Age of Onset  . Heart attack Father 30  . Heart attack Sister   . Cancer Brother   . Cancer Brother   . Cirrhosis Brother   . Heart disease Brother     . Cancer Brother   . COPD Brother   . COPD Sister   . Cancer Sister   . Cancer Sister   . Hypertension Daughter   . Stroke Neg Hx     Social History Social History   Tobacco Use  . Smoking status: Never Smoker  . Smokeless tobacco: Never Used  Substance Use Topics  . Alcohol use: No    Alcohol/week: 0.0 standard drinks  . Drug use: No     Allergies   Penicillins; 2,4-d dimethylamine (amisol); Ace inhibitors; Lanolin; Sulfa antibiotics; Vancomycin; and Sulfonamide derivatives   Review of Systems Review of Systems All other systems negative except as documented in the HPI. All pertinent positives and negatives as reviewed in the HPI.  Physical Exam Updated Vital Signs BP (!) 162/46   Pulse 60   Temp 98 F (36.7 C)   Resp (!) 27   Wt 67.6 kg   SpO2 99%   BMI 29.10 kg/m   Physical Exam  Constitutional: She is oriented to person, place, and time. She appears well-developed and well-nourished. No distress.  HENT:  Head: Normocephalic and atraumatic.  Mouth/Throat: Oropharynx is clear and moist.  Eyes: Pupils are equal, round, and reactive to light.  Neck: Normal range of motion. Neck supple.  Cardiovascular: Normal rate, regular rhythm and normal heart sounds. Exam reveals no gallop and no friction rub.  No murmur heard. Pulmonary/Chest: Tachypnea noted. She has no decreased breath sounds. She has wheezes. She has rhonchi. She has no rales.  Abdominal: Soft. Bowel sounds are normal. She exhibits no distension. There is no tenderness.  Neurological: She is alert and oriented to person, place, and time. She exhibits normal muscle tone. Coordination normal.  Skin: Skin is warm and dry. Capillary refill takes less than 2 seconds. No rash noted. No erythema.  Psychiatric: She has a normal mood and affect. Her behavior is normal.  Nursing note and vitals reviewed.    ED Treatments / Results  Labs (all labs ordered are listed, but only abnormal results are  displayed) Labs Reviewed  BASIC METABOLIC PANEL - Abnormal; Notable for the following components:      Result Value   Potassium 2.9 (*)    Glucose, Bld 107 (*)    Creatinine, Ser 1.01 (*)    GFR calc non Af Amer 46 (*)    GFR calc Af Amer 54 (*)    All other components within normal limits  CBC WITH DIFFERENTIAL/PLATELET - Abnormal; Notable for the following components:   RBC  3.42 (*)    Hemoglobin 11.3 (*)    HCT 35.1 (*)    MCV 102.6 (*)    All other components within normal limits  BRAIN NATRIURETIC PEPTIDE - Abnormal; Notable for the following components:   B Natriuretic Peptide 141.3 (*)    All other components within normal limits  I-STAT CG4 LACTIC ACID, ED - Abnormal; Notable for the following components:   Lactic Acid, Venous 2.04 (*)    All other components within normal limits  I-STAT CG4 LACTIC ACID, ED - Abnormal; Notable for the following components:   Lactic Acid, Venous 2.39 (*)    All other components within normal limits  TROPONIN I  URINALYSIS, ROUTINE W REFLEX MICROSCOPIC    EKG None  Radiology Dg Chest 2 View  Result Date: 03/18/2018 CLINICAL DATA:  Cough EXAM: CHEST - 2 VIEW COMPARISON:  February 25, 2018 FINDINGS: There is a small left pleural effusion with mild left base atelectasis. There is no frank edema or consolidation. Heart is upper normal in size with pulmonary vascularity normal. Pacemaker leads are attached to the right atrium right ventricle. There is aortic atherosclerosis. No adenopathy. No bone lesions. IMPRESSION: Small left pleural effusion with left base atelectasis. No edema or consolidation. Stable cardiac silhouette. Pacemaker leads attached to right atrium and right ventricle. There is aortic atherosclerosis. Aortic Atherosclerosis (ICD10-I70.0). Electronically Signed   By: Lowella Grip III M.D.   On: 03/18/2018 20:01   Ct Angio Chest Pe W/cm &/or Wo Cm  Result Date: 03/18/2018 CLINICAL DATA:  Shortness of breath. EXAM: CT  ANGIOGRAPHY CHEST WITH CONTRAST TECHNIQUE: Multidetector CT imaging of the chest was performed using the standard protocol during bolus administration of intravenous contrast. Multiplanar CT image reconstructions and MIPs were obtained to evaluate the vascular anatomy. CONTRAST:  76mL ISOVUE-370 IOPAMIDOL (ISOVUE-370) INJECTION 76% COMPARISON:  Chest radiographs obtained earlier today. Chest CT dated 01/19/2016. FINDINGS: Cardiovascular: Normally opacified pulmonary arteries with no pulmonary arterial filling defects seen. Atheromatous calcifications, including the coronary arteries and aorta. Enlarged heart due to left atrial and left ventricular enlargement. Mediastinum/Nodes: No enlarged mediastinal, hilar, or axillary lymph nodes. Thyroid gland, trachea, and esophagus demonstrate no significant findings. Small hiatal hernia. Lungs/Pleura: Minimal bilateral dependent atelectasis. Interval minimal patchy prominence of the interstitial markings. Small calcified granulomata in both lower lobes. Prominent epicardial and extrapleural fat on the left, simulating the small left pleural effusion suspected on the radiographs earlier today. No pleural fluid is seen. Upper Abdomen: Cholecystectomy clips. Atheromatous arterial calcifications. Partially included right renal cyst. Musculoskeletal: Thoracic spine degenerative changes and mild scoliosis. Review of the MIP images confirms the above findings. IMPRESSION: 1. No pulmonary emboli. 2. Minimal interstitial pulmonary edema. 3.  Calcific coronary artery and aortic atherosclerosis. Aortic Atherosclerosis (ICD10-I70.0). Electronically Signed   By: Claudie Revering M.D.   On: 03/18/2018 22:10    Procedures Procedures (including critical care time)  Medications Ordered in ED Medications  albuterol (PROVENTIL,VENTOLIN) solution continuous neb (0 mg/hr Nebulization Stopped 03/18/18 2038)  sodium chloride (PF) 0.9 % injection (has no administration in time range)   methylPREDNISolone sodium succinate (SOLU-MEDROL) 125 mg/2 mL injection 125 mg (has no administration in time range)  iopamidol (ISOVUE-370) 76 % injection 80 mL (80 mLs Intravenous Contrast Given 03/18/18 2132)     Initial Impression / Assessment and Plan / ED Course  I have reviewed the triage vital signs and the nursing notes.  Pertinent labs & imaging results that were available during my care of the patient  were reviewed by me and considered in my medical decision making (see chart for details).     Patient will need admission for the worsening shortness of breath and wheezing along with her increased respirations.  Patient is failed outpatient therapies.  I spoke with [facility] who will admit the patient.  Final Clinical Impressions(s) / ED Diagnoses   Final diagnoses:  SOB (shortness of breath)  Bronchitis  COPD exacerbation [facility])    ED Discharge Orders    None       Dalia Heading, PA-C 03/19/18 0041    Mesner, Corene Cornea, MD 03/19/18 2302

## 2018-03-18 NOTE — ED Notes (Signed)
Bed: WA13 Expected date:  Expected time:  Means of arrival:  Comments: EMS-SOB 

## 2018-03-18 NOTE — ED Notes (Signed)
Notified PA,Chris pt. I-stat CG4 Lactic acid results 2.39 and RN,[employer] made aware.

## 2018-03-19 ENCOUNTER — Other Ambulatory Visit (HOSPITAL_COMMUNITY): Payer: PPO

## 2018-03-19 DIAGNOSIS — R0602 Shortness of breath: Secondary | ICD-10-CM | POA: Insufficient documentation

## 2018-03-19 DIAGNOSIS — I25118 Atherosclerotic heart disease of native coronary artery with other forms of angina pectoris: Secondary | ICD-10-CM

## 2018-03-19 LAB — COMPREHENSIVE METABOLIC PANEL
ALK PHOS: 63 U/L (ref 38–126)
ALT: 13 U/L (ref 0–44)
AST: 27 U/L (ref 15–41)
Albumin: 3.2 g/dL — ABNORMAL LOW (ref 3.5–5.0)
Anion gap: 12 (ref 5–15)
BUN: 11 mg/dL (ref 8–23)
CALCIUM: 9.3 mg/dL (ref 8.9–10.3)
CO2: 25 mmol/L (ref 22–32)
Chloride: 101 mmol/L (ref 98–111)
Creatinine, Ser: 1.13 mg/dL — ABNORMAL HIGH (ref 0.44–1.00)
GFR calc Af Amer: 47 mL/min — ABNORMAL LOW (ref >60)
GFR calc non Af Amer: 40 mL/min — ABNORMAL LOW (ref >60)
Glucose, Bld: 175 mg/dL — ABNORMAL HIGH (ref 70–99)
Potassium: 5.1 mmol/L (ref 3.5–5.1)
Sodium: 138 mmol/L (ref 135–145)
TOTAL PROTEIN: 6.6 g/dL (ref 6.5–8.1)
Total Bilirubin: 0.8 mg/dL (ref 0.3–1.2)

## 2018-03-19 LAB — CBC WITH DIFFERENTIAL/PLATELET
Abs Immature Granulocytes: 0.02 10*3/uL (ref 0.00–0.07)
Basophils Absolute: 0 10*3/uL (ref 0.0–0.1)
Basophils Relative: 0 %
Eosinophils Absolute: 0.1 10*3/uL (ref 0.0–0.5)
Eosinophils Relative: 3 %
HCT: 36.1 % (ref 36.0–46.0)
Hemoglobin: 11.5 g/dL — ABNORMAL LOW (ref 12.0–15.0)
IMMATURE GRANULOCYTES: 0 %
Lymphocytes Relative: 7 %
Lymphs Abs: 0.4 10*3/uL — ABNORMAL LOW (ref 0.7–4.0)
MCH: 32.1 pg (ref 26.0–34.0)
MCHC: 31.9 g/dL (ref 30.0–36.0)
MCV: 100.8 fL — ABNORMAL HIGH (ref 80.0–100.0)
MONO ABS: 0.1 10*3/uL (ref 0.1–1.0)
Monocytes Relative: 1 %
Neutro Abs: 4.5 10*3/uL (ref 1.7–7.7)
Neutrophils Relative %: 89 %
Platelets: 189 10*3/uL (ref 150–400)
RBC: 3.58 MIL/uL — ABNORMAL LOW (ref 3.87–5.11)
RDW: 12.2 % (ref 11.5–15.5)
WBC: 5.1 10*3/uL (ref 4.0–10.5)
nRBC: 0 % (ref 0.0–0.2)

## 2018-03-19 LAB — MAGNESIUM: MAGNESIUM: 2.2 mg/dL (ref 1.7–2.4)

## 2018-03-19 LAB — INFLUENZA PANEL BY PCR (TYPE A & B)
Influenza A By PCR: NEGATIVE
Influenza B By PCR: NEGATIVE

## 2018-03-19 LAB — LACTIC ACID, PLASMA
Lactic Acid, Venous: 5.2 mmol/L (ref 0.5–1.9)
Lactic Acid, Venous: 4.2 mmol/L (ref 0.5–1.9)

## 2018-03-19 LAB — URINALYSIS, ROUTINE W REFLEX MICROSCOPIC
Bilirubin Urine: NEGATIVE
Glucose, UA: NEGATIVE mg/dL
Hgb urine dipstick: NEGATIVE
Ketones, ur: NEGATIVE mg/dL
Leukocytes, UA: NEGATIVE
Nitrite: NEGATIVE
Protein, ur: NEGATIVE mg/dL
Specific Gravity, Urine: 1.035 — ABNORMAL HIGH (ref 1.005–1.030)
pH: 7 (ref 5.0–8.0)

## 2018-03-19 MED ORDER — METHYLPREDNISOLONE SODIUM SUCC 125 MG IJ SOLR
60.0000 mg | Freq: Three times a day (TID) | INTRAMUSCULAR | Status: DC
  Administered 2018-03-19 – 2018-03-22 (×11): 60 mg via INTRAVENOUS
  Filled 2018-03-19 (×11): qty 2

## 2018-03-19 MED ORDER — AZITHROMYCIN 250 MG PO TABS
500.0000 mg | ORAL_TABLET | Freq: Every day | ORAL | Status: AC
  Administered 2018-03-19: 500 mg via ORAL
  Filled 2018-03-19: qty 2

## 2018-03-19 MED ORDER — ISOSORBIDE MONONITRATE ER 60 MG PO TB24
60.0000 mg | ORAL_TABLET | Freq: Every day | ORAL | Status: DC
  Administered 2018-03-19 – 2018-03-22 (×3): 60 mg via ORAL
  Filled 2018-03-19 (×4): qty 1

## 2018-03-19 MED ORDER — ESCITALOPRAM OXALATE 10 MG PO TABS
15.0000 mg | ORAL_TABLET | Freq: Every day | ORAL | Status: DC
  Administered 2018-03-19 – 2018-03-22 (×3): 15 mg via ORAL
  Filled 2018-03-19 (×4): qty 2

## 2018-03-19 MED ORDER — ALBUTEROL SULFATE (2.5 MG/3ML) 0.083% IN NEBU
2.5000 mg | INHALATION_SOLUTION | RESPIRATORY_TRACT | Status: DC | PRN
  Administered 2018-03-19: 2.5 mg via RESPIRATORY_TRACT
  Filled 2018-03-19: qty 3

## 2018-03-19 MED ORDER — ZOLPIDEM TARTRATE 5 MG PO TABS
5.0000 mg | ORAL_TABLET | Freq: Every evening | ORAL | Status: DC | PRN
  Administered 2018-03-20: 5 mg via ORAL
  Filled 2018-03-19: qty 1

## 2018-03-19 MED ORDER — HYDRALAZINE HCL 25 MG PO TABS
25.0000 mg | ORAL_TABLET | Freq: Three times a day (TID) | ORAL | Status: DC
  Administered 2018-03-19 – 2018-03-22 (×10): 25 mg via ORAL
  Filled 2018-03-19 (×11): qty 1

## 2018-03-19 MED ORDER — FAMOTIDINE 20 MG PO TABS
20.0000 mg | ORAL_TABLET | Freq: Every day | ORAL | Status: DC
  Administered 2018-03-19 – 2018-03-22 (×3): 20 mg via ORAL
  Filled 2018-03-19 (×4): qty 1

## 2018-03-19 MED ORDER — HYDRALAZINE HCL 20 MG/ML IJ SOLN: 5.0000 mg | INTRAMUSCULAR | Status: DC | PRN

## 2018-03-19 MED ORDER — CLOPIDOGREL BISULFATE 75 MG PO TABS
75.0000 mg | ORAL_TABLET | Freq: Every day | ORAL | Status: DC
  Administered 2018-03-19 – 2018-03-22 (×3): 75 mg via ORAL
  Filled 2018-03-19 (×4): qty 1

## 2018-03-19 MED ORDER — DOCUSATE SODIUM 100 MG PO CAPS: 100.0000 mg | ORAL_CAPSULE | Freq: Every day | ORAL | Status: DC | PRN

## 2018-03-19 MED ORDER — GUAIFENESIN-DM 100-10 MG/5ML PO SYRP
5.0000 mL | ORAL_SOLUTION | ORAL | Status: DC | PRN
  Administered 2018-03-21 – 2018-03-22 (×2): 5 mL via ORAL
  Filled 2018-03-19 (×4): qty 10

## 2018-03-19 MED ORDER — HYDROXYZINE HCL 10 MG PO TABS: 10.0000 mg | ORAL_TABLET | Freq: Three times a day (TID) | ORAL | Status: DC | PRN

## 2018-03-19 MED ORDER — DM-GUAIFENESIN ER 30-600 MG PO TB12
1.0000 | ORAL_TABLET | Freq: Two times a day (BID) | ORAL | Status: DC
  Administered 2018-03-19 – 2018-03-22 (×7): 1 via ORAL
  Filled 2018-03-19 (×7): qty 1

## 2018-03-19 MED ORDER — AZITHROMYCIN 250 MG PO TABS
250.0000 mg | ORAL_TABLET | Freq: Every day | ORAL | Status: DC
  Administered 2018-03-21: 250 mg via ORAL
  Filled 2018-03-19 (×2): qty 1

## 2018-03-19 MED ORDER — POLYETHYLENE GLYCOL 3350 17 GM/SCOOP PO POWD
17.0000 g | Freq: Every day | ORAL | Status: DC | PRN
  Filled 2018-03-19: qty 255

## 2018-03-19 MED ORDER — PRAVASTATIN SODIUM 40 MG PO TABS
40.0000 mg | ORAL_TABLET | Freq: Every day | ORAL | Status: DC
  Administered 2018-03-19 – 2018-03-22 (×3): 40 mg via ORAL
  Filled 2018-03-19 (×4): qty 1

## 2018-03-19 MED ORDER — AMIODARONE HCL 100 MG PO TABS
100.0000 mg | ORAL_TABLET | Freq: Every day | ORAL | Status: DC
  Administered 2018-03-19 – 2018-03-21 (×2): 100 mg via ORAL
  Filled 2018-03-19 (×3): qty 1

## 2018-03-19 MED ORDER — POLYETHYLENE GLYCOL 3350 17 G PO PACK
17.0000 g | PACK | Freq: Every day | ORAL | Status: DC | PRN
  Filled 2018-03-19: qty 1

## 2018-03-19 MED ORDER — LEVOTHYROXINE SODIUM 50 MCG PO TABS
50.0000 ug | ORAL_TABLET | Freq: Every day | ORAL | Status: DC
  Administered 2018-03-19 – 2018-03-22 (×4): 50 ug via ORAL
  Filled 2018-03-19 (×4): qty 1

## 2018-03-19 MED ORDER — PANTOPRAZOLE SODIUM 40 MG PO TBEC
40.0000 mg | DELAYED_RELEASE_TABLET | Freq: Every day | ORAL | Status: DC
  Administered 2018-03-19 – 2018-03-22 (×3): 40 mg via ORAL
  Filled 2018-03-19 (×4): qty 1

## 2018-03-19 MED ORDER — CRANBERRY 250 MG PO CAPS: 250.0000 mg | ORAL_CAPSULE | Freq: Every day | ORAL | Status: DC

## 2018-03-19 MED ORDER — ACETAMINOPHEN 325 MG PO TABS: 650.0000 mg | ORAL_TABLET | Freq: Four times a day (QID) | ORAL | Status: DC | PRN

## 2018-03-19 MED ORDER — FERROUS SULFATE 325 (65 FE) MG PO TABS
325.0000 mg | ORAL_TABLET | Freq: Every day | ORAL | Status: DC
  Administered 2018-03-19 – 2018-03-22 (×3): 325 mg via ORAL
  Filled 2018-03-19 (×4): qty 1

## 2018-03-19 MED ORDER — MOMETASONE FURO-FORMOTEROL FUM 100-5 MCG/ACT IN AERO: 2.0000 | INHALATION_SPRAY | Freq: Two times a day (BID) | RESPIRATORY_TRACT | Status: DC

## 2018-03-19 MED ORDER — IPRATROPIUM-ALBUTEROL 0.5-2.5 (3) MG/3ML IN SOLN
3.0000 mL | Freq: Four times a day (QID) | RESPIRATORY_TRACT | Status: DC
  Administered 2018-03-19 (×3): 3 mL via RESPIRATORY_TRACT
  Filled 2018-03-19 (×3): qty 3

## 2018-03-19 MED ORDER — MOMETASONE FURO-FORMOTEROL FUM 100-5 MCG/ACT IN AERO
2.0000 | INHALATION_SPRAY | Freq: Two times a day (BID) | RESPIRATORY_TRACT | Status: DC
  Administered 2018-03-19 – 2018-03-22 (×5): 2 via RESPIRATORY_TRACT
  Filled 2018-03-19: qty 8.8

## 2018-03-19 MED ORDER — POTASSIUM CHLORIDE CRYS ER 20 MEQ PO TBCR
20.0000 meq | EXTENDED_RELEASE_TABLET | Freq: Every day | ORAL | Status: DC
  Administered 2018-03-19: 20 meq via ORAL
  Filled 2018-03-19: qty 1

## 2018-03-19 MED ORDER — ENOXAPARIN SODIUM 40 MG/0.4ML ~~LOC~~ SOLN
40.0000 mg | SUBCUTANEOUS | Status: DC
  Administered 2018-03-19: 40 mg via SUBCUTANEOUS
  Filled 2018-03-19: qty 0.4

## 2018-03-19 MED ORDER — VITAMIN E 180 MG (400 UNIT) PO CAPS
400.0000 [IU] | ORAL_CAPSULE | Freq: Every day | ORAL | Status: DC
  Administered 2018-03-19 – 2018-03-22 (×3): 400 [IU] via ORAL
  Filled 2018-03-19 (×4): qty 1

## 2018-03-19 MED ORDER — FUROSEMIDE 40 MG PO TABS
40.0000 mg | ORAL_TABLET | Freq: Two times a day (BID) | ORAL | Status: DC
  Administered 2018-03-19 – 2018-03-21 (×4): 40 mg via ORAL
  Filled 2018-03-19 (×5): qty 1

## 2018-03-19 MED ORDER — IPRATROPIUM-ALBUTEROL 0.5-2.5 (3) MG/3ML IN SOLN
3.0000 mL | Freq: Four times a day (QID) | RESPIRATORY_TRACT | Status: DC
  Administered 2018-03-20 – 2018-03-22 (×10): 3 mL via RESPIRATORY_TRACT
  Filled 2018-03-19 (×11): qty 3

## 2018-03-19 MED ORDER — BIOTIN 1 MG PO CAPS: 1.0000 mg | ORAL_CAPSULE | Freq: Every day | ORAL | Status: DC

## 2018-03-19 MED ORDER — TETRAHYDROZOLINE HCL 0.05 % OP SOLN
1.0000 [drp] | Freq: Every day | OPHTHALMIC | Status: DC | PRN
  Administered 2018-03-19: 1 [drp] via OPHTHALMIC
  Filled 2018-03-19 (×2): qty 15

## 2018-03-19 MED ORDER — IPRATROPIUM-ALBUTEROL 0.5-2.5 (3) MG/3ML IN SOLN
3.0000 mL | RESPIRATORY_TRACT | Status: DC
  Administered 2018-03-19: 3 mL via RESPIRATORY_TRACT
  Filled 2018-03-19 (×2): qty 3

## 2018-03-19 NOTE — Progress Notes (Signed)
PHARMACIST - PHYSICIAN ORDER COMMUNICATION  CONCERNING: P&T Medication Policy on Herbal Medications  DESCRIPTION:  This patient's order for:  Cranberry and Biotin  has been noted.  This product(s) is classified as an "herbal" or natural product. Due to a lack of definitive safety studies or FDA approval, nonstandard manufacturing practices, plus the potential risk of unknown drug-drug interactions while on inpatient medications, the Pharmacy and Therapeutics Committee does not permit the use of "herbal" or natural products of this type within [facility].   ACTION TAKEN: The pharmacy department is unable to verify this order at this time and your patient has been informed of this safety policy. Please reevaluate patient's clinical condition at discharge and address if the herbal or natural product(s) should be resumed at that time.  Leone Haven, PharmD

## 2018-03-19 NOTE — ED Notes (Signed)
Spoke with Admitting provider via phone about patient Lactic Plasma Level.

## 2018-03-19 NOTE — ED Notes (Signed)
Physical Therapy at bedside.

## 2018-03-19 NOTE — ED Notes (Signed)
Pt placed on inpatient hospital bed in ED

## 2018-03-19 NOTE — Progress Notes (Signed)
PT Cancellation Note  Patient Details Name: ANDRIANNA MANALANG MRN: [id number] DOB: 1917-07-28   Cancelled Treatment:    Reason Eval/Treat Not Completed: Patient at procedure or test/unavailable(pt working with speech therapist at present. Will check back later today. )   Philomena Doheny PT 03/19/2018  Acute Rehabilitation Services Pager [phone] Office [phone]

## 2018-03-19 NOTE — ED Notes (Signed)
Date and time results received: 03/19/18 1229 (use smartphrase ".now" to insert current time)  Test: Lactic acid Critical Value: 4.2  Name of Provider Notified: Ali Lowe, RN  Orders Received? Or Actions Taken?:

## 2018-03-19 NOTE — Progress Notes (Signed)
Patient ID: Laura Zuniga, female   DOB: Feb 19, 1918, 84 y.o.   MRN: [id number]  PROGRESS NOTE    Laura Zuniga  OIN:[id number] DOB: 1918-01-10 DOA: 03/18/2018 PCP: Wenda Low, MD   Brief Narrative:  84 year old female with history of hypertension, hyperlipidemia, ILD, COPD, GERD, hyperthyroidism, depression, bradycardia, C. difficile colitis, CAD with stent placement, diastolic CHF, CKD stage III, PAF not on anticoagulation, iron deficiency anemia, pacemaker placement due to sinus node dysfunction presented with shortness of breath gradually getting worse for the last 6 weeks despite being treated with antibiotics and prednisone for 3 times per PCP.  CT angiogram chest was negative for PE but showed mild interstitial edema.  Assessment & Plan:   Principal Problem:   COPD exacerbation ([place]) Active Problems:   HLD (hyperlipidemia)   Depression   Essential hypertension   CAD (coronary artery disease)   PAF (paroxysmal atrial fibrillation) (HCC)   Chronic diastolic heart failure (HCC)   GERD (gastroesophageal reflux disease)   CKD (chronic kidney disease), stage III (HCC)   Cardiac pacemaker   ILD (interstitial lung disease) (HCC)   Hypothyroidism   Hypokalemia   Lactic acidosis   Probable COPD exacerbation -CT angiogram was negative for PE -Continue intravenous Solu-Medrol.  Continue Zithromax.  Continue nebs.  Add Dulera -Incentive spirometry -Influenza A and B negative -Oxygen supplementation if needed.  Currently on room air -SLP evaluation because there is concerns for aspiration.  Chronic diastolic heart failure -CT angiogram showed mild interstitial edema.  Lasix dose was increased to 40 milligrams twice a day.  Strict input and output.  Daily weights. -Echo on 09/28/2013 showed EF of 55 to 60%.  Will get repeat echo.  Outpatient follow-up with cardiology continue hydralazine, isosorbide mononitrate  CAD status post stent -No chest pain -Continue Plavix,  pravastatin and Imdur  Atrial fibrillation -Rate controlled.  Continue amiodarone.  Outpatient follow-up with cardiology.  Not on anticoagulation at home, not sure why  Hypertension -Monitor blood pressure.  Continue hydralazine, isosorbide mononitrate and Lasix  Depression -Continue Lexapro  GERD -Continue Protonix and Pepcid  Chronic kidney disease stage III -Stable.  Monitor  Hypothyroidism -Continue Synthroid  Hypokalemia -Resolved.  Lactic acidosis -Questionable cause.  5.2 this morning.  No fever or leukocytosis.  No evidence of sepsis.  DVT prophylaxis: Lovenox Code Status: Full Family Communication: None at bedside Disposition Plan: Home in 1 to 2 days once clinically improved  Consultants: None  Procedures: None  Antimicrobials: Zithromax   Subjective: Patient seen and examined at bedside. Poor historian.  She denies any worsening cough or shortness of breath. Feels slightly better. No overnight fever or vomiting.  Objective: Vitals:   03/19/18 1047 03/19/18 1056 03/19/18 1058 03/19/18 1100  BP:    (!) 141/59  Pulse:    67  Resp: (!) 26 (!) 24 (!) 28 (!) 31  Temp:      TempSrc:      SpO2:    95%  Weight:        Intake/Output Summary (Last 24 hours) at 03/19/2018 1102 Last data filed at 03/19/2018 0225 Gross per 24 hour  Intake 198.78 ml  Output -  Net 198.78 ml   Filed Weights   03/18/18 1807  Weight: 67.6 kg    Examination:  General exam: Elderly female lying in bed.  No distress. Poor historian respiratory system: Bilateral decreased breath sounds at bases with scattered wheezing Cardiovascular system: S1 & S2 heard, Rate controlled Gastrointestinal system: Abdomen is nondistended, soft and  nontender. Normal bowel sounds heard. Extremities: No cyanosis, clubbing; trace edema     Data Reviewed: I have personally reviewed following labs and imaging studies  CBC: Recent Labs  Lab 03/18/18 1829 03/19/18 0845  WBC 8.1 5.1    NEUTROABS 5.5 4.5  HGB 11.3* 11.5*  HCT 35.1* 36.1  MCV 102.6* 100.8*  PLT 181 397   Basic Metabolic Panel: Recent Labs  Lab 03/18/18 1829 03/19/18 0551 03/19/18 0845  NA 140  --  138  K 2.9*  --  5.1  CL 98  --  101  CO2 28  --  25  GLUCOSE 107*  --  175*  BUN 10  --  11  CREATININE 1.01*  --  1.13*  CALCIUM 9.0  --  9.3  MG  --  2.2  --    GFR: CrCl cannot be calculated (Unknown ideal weight.). Liver Function Tests: Recent Labs  Lab 03/19/18 0845  AST 27  ALT 13  ALKPHOS 63  BILITOT 0.8  PROT 6.6  ALBUMIN 3.2*   No results for input(s): LIPASE, AMYLASE in the last 168 hours. No results for input(s): AMMONIA in the last 168 hours. Coagulation Profile: No results for input(s): INR, PROTIME in the last 168 hours. Cardiac Enzymes: Recent Labs  Lab 03/18/18 1829  TROPONINI <0.03   BNP (last 3 results) No results for input(s): PROBNP in the last 8760 hours. HbA1C: No results for input(s): HGBA1C in the last 72 hours. CBG: No results for input(s): GLUCAP in the last 168 hours. Lipid Profile: No results for input(s): CHOL, HDL, LDLCALC, TRIG, CHOLHDL, LDLDIRECT in the last 72 hours. Thyroid Function Tests: No results for input(s): TSH, T4TOTAL, FREET4, T3FREE, THYROIDAB in the last 72 hours. Anemia Panel: No results for input(s): VITAMINB12, FOLATE, FERRITIN, TIBC, IRON, RETICCTPCT in the last 72 hours. Sepsis Labs: Recent Labs  Lab 03/18/18 1836 03/18/18 2053 03/19/18 0625  LATICACIDVEN 2.04* 2.39* 5.2*    No results found for this or any previous visit (from the past 240 hour(s)).       Radiology Studies: Dg Chest 2 View  Result Date: 03/18/2018 CLINICAL DATA:  Cough EXAM: CHEST - 2 VIEW COMPARISON:  February 25, 2018 FINDINGS: There is a small left pleural effusion with mild left base atelectasis. There is no frank edema or consolidation. Heart is upper normal in size with pulmonary vascularity normal. Pacemaker leads are attached to the  right atrium right ventricle. There is aortic atherosclerosis. No adenopathy. No bone lesions. IMPRESSION: Small left pleural effusion with left base atelectasis. No edema or consolidation. Stable cardiac silhouette. Pacemaker leads attached to right atrium and right ventricle. There is aortic atherosclerosis. Aortic Atherosclerosis (ICD10-I70.0). Electronically Signed   By: Lowella Grip III M.D.   On: 03/18/2018 20:01   Ct Angio Chest Pe W/cm &/or Wo Cm  Result Date: 03/18/2018 CLINICAL DATA:  Shortness of breath. EXAM: CT ANGIOGRAPHY CHEST WITH CONTRAST TECHNIQUE: Multidetector CT imaging of the chest was performed using the standard protocol during bolus administration of intravenous contrast. Multiplanar CT image reconstructions and MIPs were obtained to evaluate the vascular anatomy. CONTRAST:  16mL ISOVUE-370 IOPAMIDOL (ISOVUE-370) INJECTION 76% COMPARISON:  Chest radiographs obtained earlier today. Chest CT dated 01/19/2016. FINDINGS: Cardiovascular: Normally opacified pulmonary arteries with no pulmonary arterial filling defects seen. Atheromatous calcifications, including the coronary arteries and aorta. Enlarged heart due to left atrial and left ventricular enlargement. Mediastinum/Nodes: No enlarged mediastinal, hilar, or axillary lymph nodes. Thyroid gland, trachea, and esophagus demonstrate no  significant findings. Small hiatal hernia. Lungs/Pleura: Minimal bilateral dependent atelectasis. Interval minimal patchy prominence of the interstitial markings. Small calcified granulomata in both lower lobes. Prominent epicardial and extrapleural fat on the left, simulating the small left pleural effusion suspected on the radiographs earlier today. No pleural fluid is seen. Upper Abdomen: Cholecystectomy clips. Atheromatous arterial calcifications. Partially included right renal cyst. Musculoskeletal: Thoracic spine degenerative changes and mild scoliosis. Review of the MIP images confirms the above  findings. IMPRESSION: 1. No pulmonary emboli. 2. Minimal interstitial pulmonary edema. 3.  Calcific coronary artery and aortic atherosclerosis. Aortic Atherosclerosis (ICD10-I70.0). Electronically Signed   By: Claudie Revering M.D.   On: 03/18/2018 22:10        Scheduled Meds: . amiodarone  100 mg Oral Daily  . [START ON 03/20/2018] azithromycin  250 mg Oral Daily  . clopidogrel  75 mg Oral Q breakfast  . dextromethorphan-guaiFENesin  1 tablet Oral BID  . enoxaparin (LOVENOX) injection  40 mg Subcutaneous Q24H  . escitalopram  15 mg Oral Daily  . famotidine  20 mg Oral Daily  . ferrous sulfate  325 mg Oral QPC lunch  . furosemide  40 mg Oral BID  . hydrALAZINE  25 mg Oral Q8H  . ipratropium-albuterol  3 mL Nebulization Q6H  . isosorbide mononitrate  60 mg Oral Daily  . levothyroxine  50 mcg Oral QAC breakfast  . methylPREDNISolone (SOLU-MEDROL) injection  60 mg Intravenous Q8H  . pantoprazole  40 mg Oral QPC lunch  . potassium chloride SA  20 mEq Oral Daily  . pravastatin  40 mg Oral Daily  . vitamin E  400 Units Oral QPC lunch   Continuous Infusions:   LOS: 0 days        Aline August, MD [facility] Pager [phone]  If 7PM-7AM, please contact night-coverage [email or web address] Password [email or web address] 03/19/2018, 11:02 AM

## 2018-03-19 NOTE — ED Notes (Signed)
Date and time results received: 03/19/18 0734 (use smartphrase ".now" to insert current time)  Test: Lactic acid Critical Value: 5.2  Name of Provider Notified: Ali Lowe, RN  Orders Received? Or Actions Taken?:

## 2018-03-19 NOTE — ED Notes (Signed)
Attempted to collect Sputum Culture. Patient could not produce sputum.

## 2018-03-19 NOTE — Evaluation (Signed)
Physical Therapy Evaluation Patient Details Name: Laura Zuniga MRN: [id number] DOB: September 18, 1917 Today's Date: 03/19/2018   History of Present Illness  84 year old female with history of hypertension, hyperlipidemia, ILD, COPD, GERD, hyperthyroidism, depression, bradycardia, C. difficile colitis, CAD with stent placement, diastolic CHF, CKD stage III, PAF not on anticoagulation, iron deficiency anemia, pacemaker placement due to sinus node dysfunction presented with shortness of breath gradually getting worse for the last 6 weeks despite being treated with antibiotics and prednisone for 3 times per PCP  Clinical Impression  Pt admitted with above diagnosis. Pt currently with functional limitations due to the deficits listed below (see PT Problem List). Pt ambulated 69' with RW, SaO2 95% on room air, distance limited by 3/4 dyspnea. Pt reports she can ambulate farther at baseline.  Pt will benefit from skilled PT to increase their independence and safety with mobility to allow discharge to the venue listed below.       Follow Up Recommendations Home health PT;Supervision for mobility/OOB    Equipment Recommendations  None recommended by PT    Recommendations for Other Services       Precautions / Restrictions Precautions Precautions: Fall Precaution Comments: no falls in past 1 year, monitor O2 (uses 2L O2 at night and prn with activity) Restrictions Weight Bearing Restrictions: No      Mobility  Bed Mobility Overal bed mobility: Needs Assistance Bed Mobility: Supine to Sit;Sit to Supine     Supine to sit: Modified independent (Device/Increase time);HOB elevated Sit to supine: Min assist   General bed mobility comments: min A for LEs into bed  Transfers Overall transfer level: Needs assistance Equipment used: Rolling walker (2 wheeled) Transfers: Sit to/from Stand Sit to Stand: Min guard         General transfer comment: VCs hand  placement  Ambulation/Gait Ambulation/Gait assistance: Min guard Gait Distance (Feet): 80 Feet Assistive device: Rolling walker (2 wheeled) Gait Pattern/deviations: Step-through pattern;Decreased stride length Gait velocity: WFL   General Gait Details: VCs to step closer to RW, SaO2 95% on RA, RR 30 with walking  Stairs            Wheelchair Mobility    Modified Rankin (Stroke Patients Only)       Balance Overall balance assessment: Modified Independent                                           Pertinent Vitals/Pain Pain Assessment: No/denies pain    Home Living Family/patient expects to be discharged to:: Private residence Living Arrangements: Children Available Help at Discharge: Family;Available 24 hours/day Type of Home: House Home Access: Stairs to enter Entrance Stairs-Rails: Right Entrance Stairs-Number of Steps: 2 Home Layout: One level Home Equipment: Toilet riser;Walker - 2 wheels;Walker - 4 wheels;Shower seat      Prior Function Level of Independence: Needs assistance   Gait / Transfers Assistance Needed: uses RW to walk short distances, limited by SOB  ADL's / Homemaking Assistance Needed: daughters assist         Hand Dominance        Extremity/Trunk Assessment        Lower Extremity Assessment Lower Extremity Assessment: Overall WFL for tasks assessed(4/5 B knee ext, sensation intact to light touch)    Cervical / Trunk Assessment Cervical / Trunk Assessment: Normal  Communication   Communication: No difficulties  Cognition Arousal/Alertness: Awake/alert Behavior During  Therapy: WFL for tasks assessed/performed Overall Cognitive Status: Within Functional Limits for tasks assessed                                        General Comments      Exercises     Assessment/Plan    PT Assessment Patient needs continued PT services  PT Problem List Decreased activity tolerance;Decreased  mobility;Cardiopulmonary status limiting activity;Decreased safety awareness       PT Treatment Interventions Gait training;DME instruction;Therapeutic activities;Therapeutic exercise;Functional mobility training;Patient/family education;Balance training    PT Goals (Current goals can be found in the Care Plan section)  Acute Rehab PT Goals Patient Stated Goal: to walk farther PT Goal Formulation: With patient/family Time For Goal Achievement: 04/02/18 Potential to Achieve Goals: Good    Frequency Min 3X/week   Barriers to discharge        Co-evaluation               AM-PAC PT "6 Clicks" Mobility  Outcome Measure Help needed turning from your back to your side while in a flat bed without using bedrails?: A Little Help needed moving from lying on your back to sitting on the side of a flat bed without using bedrails?: A Little Help needed moving to and from a bed to a chair (including a wheelchair)?: A Little Help needed standing up from a chair using your arms (e.g., wheelchair or bedside chair)?: A Little Help needed to walk in hospital room?: A Little Help needed climbing 3-5 steps with a railing? : A Little 6 Click Score: 18    End of Session Equipment Utilized During Treatment: Gait belt Activity Tolerance: Patient limited by fatigue Patient left: in bed;with call bell/phone within reach;with family/visitor present Nurse Communication: Mobility status PT Visit Diagnosis: Difficulty in walking, not elsewhere classified (R26.2)    Time: 1350-1426 PT Time Calculation (min) (ACUTE ONLY): 36 min   Charges:   PT Evaluation $PT Eval Low Complexity: 1 Low PT Treatments $Gait Training: 8-22 mins       Blondell Reveal Kistler PT 03/19/2018  Acute Rehabilitation Services Pager [phone] Office [phone]

## 2018-03-19 NOTE — Evaluation (Signed)
Clinical/Bedside Swallow Evaluation Patient Details  Name: Laura Zuniga MRN: [id number] Date of Birth: 19-Feb-1918  Today's Date: 03/19/2018 Time: SLP Start Time (ACUTE ONLY): 1213 SLP Stop Time (ACUTE ONLY): 1300 SLP Time Calculation (min) (ACUTE ONLY): 47 min  Past Medical History:  Past Medical History:  Diagnosis Date  . Bradycardia    a. Amiodarone DC'd 12/16 >> b. Holter 1/17 - NSR, sinus brady, junctional rhythm, pause 2.4 s  . C. difficile colitis 2015  . CAD (coronary artery disease)    never had cardiac cath, CAD dx by positive ant. wall defect on Nuc. study 2007 >>  no cath due to high risk/advanced age  . Chronic diastolic CHF (congestive heart failure) ([place])    a. Echo 6/15 - Mild LVH, EF 55-60%, mild AI, mild MR, moderate LAE, mild RAE, PASP 52 mm Hg  . CKD (chronic kidney disease)   . COPD (chronic obstructive pulmonary disease) ([place])   . Diverticulitis   . GERD (gastroesophageal reflux disease)   . Hypertension   . Macular degeneration   . PAF (paroxysmal atrial fibrillation) (HCC)    not optimal candidate for anticoagulation   Past Surgical History:  Past Surgical History:  Procedure Laterality Date  . APPENDECTOMY    . CHOLECYSTECTOMY    . EP IMPLANTABLE DEVICE N/A 05/17/2015   Procedure: Pacemaker Implant;  Surgeon: Evans Lance, MD;  Location: [facility];  Service: Cardiovascular;  Laterality: N/A;  . ESOPHAGOGASTRODUODENOSCOPY N/A 09/22/2013   Procedure: ESOPHAGOGASTRODUODENOSCOPY (EGD);  Surgeon: Lear Ng, MD;  Location: [facility] ENDOSCOPY;  Service: Endoscopy;  Laterality: N/A;  . ESOPHAGOGASTRODUODENOSCOPY N/A 09/22/2013   Procedure: ESOPHAGOGASTRODUODENOSCOPY (EGD);  Surgeon: Lear Ng, MD;  Location: [facility] ENDOSCOPY;  Service: Endoscopy;  Laterality: N/A;  . EYE SURGERY    . FLEXIBLE SIGMOIDOSCOPY N/A 09/22/2013   Procedure: FLEXIBLE SIGMOIDOSCOPY;  Surgeon: Lear Ng, MD;  Location: [facility] ENDOSCOPY;  Service: Endoscopy;   Laterality: N/A;  . FLEXIBLE SIGMOIDOSCOPY N/A 09/22/2013   Procedure: FLEXIBLE SIGMOIDOSCOPY;  Surgeon: Lear Ng, MD;  Location: [facility] ENDOSCOPY;  Service: Endoscopy;  Laterality: N/A;  unprepped/ egd first  . VAGINAL HYSTERECTOMY     HPI:  84 yo admit to [facility] with COPD exacerbation.  PMH + for HTN, CHF, ILD, CAD. Pt treated with bronchodialators and Solu-Medrol.  Pt has h/o GERD and daughter Laura Zuniga states she continues with frequent belching and regurgitating food after meals despite being on 2 reflux medications.  UGI 08/2017 showed normal oropharyngeal swallow, moderate HH, not esophageal stricture or obstruction.  Prior MBS 2017 without aspiration and trace laryngeal penetration. Daughter reports pt required heimlich manuever this year during a meal.  Family reports pt with poor intake, to which pt stated she was not hungry.  She denies dysphagia impacting her intake.     Assessment / Plan / Recommendation Clinical Impression  Pt with negative CN exam. She presents with functional oropharyngeal swallow abillity based on clinical swallow evaluation. NO indications of aspiration observed.  Pt only consumed a few bites/sips as daughter stated she just finished breakfast.  Pt does report frequent belching and sensed food lodging on left side of throat during evaluation - Liquid wash faciliated sensation of clearance.  Given pt with hiatal hernia - suspect applesauce residual sensation was from distal esophagus.    Pt takes large pills with applesauce at home and given current respiratory status, recommend to continue said practice.    Recommend pt start diet - regular but advise  ordering soft meats to ease swallow.  Also recommended pt eat several small meals and drink plenty of liquids during meals - staying upright after.  Educated daughter whom is pt's caregiver to keep a watch on the pt's frequent belching/ regurgitation etc  and address these concerns with MD as needed.  No SLP follow up  with this most pleasant pt.  Thanks for this referral.   SLP Visit Diagnosis: Dysphagia, unspecified (R13.10)    Aspiration Risk  Mild aspiration risk    Diet Recommendation Regular;Thin liquid   Liquid Administration via: Cup;Straw Medication Administration: Whole meds with puree(start with liquids) Supervision: Patient able to self feed;Comment(set up assist due to macular degeneration - bright lights on ) Compensations: Slow rate;Small sips/bites(small frequent meals, start meals with liquids) Postural Changes: Remain upright for at least 30 minutes after po intake;Seated upright at 90 degrees    Other  Recommendations Oral Care Recommendations: Oral care BID   Follow up Recommendations None      Frequency and Duration   n/a      n/a   Prognosis    n/a    Swallow Study   General Date of Onset: 03/19/18 HPI: 84 yo admit to [facility] with COPD exacerbation.  PMH + for HTN, CHF, ILD, CAD. Pt treated with bronchodialators and Solu-Medrol.  Pt has h/o GERD and daughter Laura Zuniga states she continues with frequent belching and regurgitating food after meals despite being on 2 reflux medications.  UGI 08/2017 showed normal oropharyngeal swallow, moderate HH, not esophageal stricture or obstruction.  Prior MBS 2017 without aspiration and trace laryngeal penetration. Daughter reports pt required heimlich manuever this year during a meal.  Family reports pt with poor intake, to which pt stated she was not hungry.  She denies dysphagia impacting her intake.   Diet Prior to this Study: NPO Temperature Spikes Noted: No Respiratory Status: Room air(pt uses 2 liters of oxygen at home at night) History of Recent Intubation: No Behavior/Cognition: Alert;Cooperative;Pleasant mood Oral Cavity Assessment: Within Functional Limits Oral Care Completed by SLP: No Oral Cavity - Dentition: Adequate natural dentition;Other (Comment)(partial (upper) was thrown away) Vision: Impaired for self-feeding(pt has  macular degeneration, bright lights turned on helpful for pt to see better) Self-Feeding Abilities: Able to feed self;Needs set up Patient Positioning: Upright in bed Baseline Vocal Quality: Hoarse Volitional Cough: Strong Volitional Swallow: Able to elicit    Oral/Motor/Sensory Function Overall Oral Motor/Sensory Function: Within functional limits   Ice Chips Ice chips: Not tested   Thin Liquid Thin Liquid: Within functional limits Presentation: Cup;Straw Other Comments: pt did not pass 3 ounce water test as she required rest break due to breathing effort, she did not cough after sequential intake of 2 ounces of water    Nectar Thick Nectar Thick Liquid: Not tested   Honey Thick Honey Thick Liquid: Not tested   Puree Puree: Impaired Presentation: Self Fed;Spoon Other Comments: pt reported sensation of applesauce on left side of throat, liquid wash helped to clear sensation - suspect this may be referrant to distal esophagus   Solid     Solid: Within functional limits Presentation: Self Fredirick Lathe 03/19/2018,1:33 PM  Luanna Salk, [facility] SLP [place] Pager [phone] Office [phone]

## 2018-03-19 NOTE — ED Notes (Signed)
Laura Zuniga, NT and this Probation officer assisted patient with bedpan. Patient did urinate. Performed patient hygiene. Changed patients diaper, incontinence pad, and applied a clean purwick. Repositioned patient in the bed for comfort.

## 2018-03-19 NOTE — ED Notes (Signed)
ED TO INPATIENT HANDOFF REPORT  Name/Age/Gender Laura Zuniga 84 y.o. female  Code Status    Code Status Orders  (From admission, onward)         Start     Ordered   03/19/18 0111  Full code  Continuous     03/19/18 0112        Code Status History    Date Active Date Inactive Code Status Order ID Comments User Context   07/03/2016 0823 07/06/2016 1931 Full Code [id number]  Samella Parr, NP ED   10/03/2013 1546 10/12/2013 1626 Full Code [id number]  Nita Sells, MD Inpatient   09/29/2013 1517 09/30/2013 1720 DNR [id number]  Wenda Low, MD Inpatient   09/16/2013 1800 09/29/2013 1517 DNR [id number]  Barton Dubois, MD Inpatient   06/28/2011 2208 06/29/2011 1555 Full Code [id number]  Robbie Lis, MD ED    Advance Directive Documentation     Most Recent Value  Type of Advance Directive  Healthcare Power of Attorney, Living will  Pre-existing out of facility DNR order (yellow form or pink MOST form)  -  "MOST" Form in Place?  -      Home/SNF/Other Home  Chief Complaint SOB  Level of Care/Admitting Diagnosis ED Disposition    ED Disposition Condition [place]: [place] [100102]  Level of Care: Telemetry [5]  Admit to tele based on following criteria: Other see comments  Comments: CHF  Diagnosis: COPD exacerbation [facility]) [003704]  Admitting Physician: Ivor Costa [[id number]]  Attending Physician: Ivor Costa [[id number]]  PT Class (Do Not Modify): Observation [104]  PT Acc Code (Do Not Modify): Observation [10022]       Medical History Past Medical History:  Diagnosis Date  . Bradycardia    a. Amiodarone DC'd 12/16 >> b. Holter 1/17 - NSR, sinus brady, junctional rhythm, pause 2.4 s  . C. difficile colitis 2015  . CAD (coronary artery disease)    never had cardiac cath, CAD dx by positive ant. wall defect on Nuc. study 2007 >>  no cath due to high risk/advanced age  . Chronic diastolic CHF (congestive heart failure) ([place])     a. Echo 6/15 - Mild LVH, EF 55-60%, mild AI, mild MR, moderate LAE, mild RAE, PASP 52 mm Hg  . CKD (chronic kidney disease)   . COPD (chronic obstructive pulmonary disease) ([place])   . Diverticulitis   . GERD (gastroesophageal reflux disease)   . Hypertension   . Macular degeneration   . PAF (paroxysmal atrial fibrillation) (H[facility])    not optimal candidate for anticoagulation    Allergies Allergies  Allergen Reactions  . Penicillins Other (See Comments)    Unknown allergic reaction Has patient had a PCN reaction causing immediate rash, facial/tongue/throat swelling, SOB or lightheadedness with hypotension: unknown Has patient had a PCN reaction causing severe rash involving mucus membranes or skin necrosis: unknown Has patient had a PCN reaction that required hospitalization: unknown Has patient had a PCN reaction occurring within the last 10 years: unknown If all of the above answers are "NO", then may proceed with Cephalosporin use.   . 2,4-D Dimethylamine (Amisol) Other (See Comments)  . Ace Inhibitors Other (See Comments)  . Lanolin Hives  . Sulfa Antibiotics   . Vancomycin     Red man syndrome  . Sulfonamide Derivatives Swelling and Rash    IV Location/Drains/Wounds Patient Lines/Drains/Airways Status   Active Line/Drains/Airways    Name:   Placement date:  Placement time:   Site:   Days:   Peripheral IV 03/18/18 Left Antecubital   03/18/18    -    Antecubital   1          Labs/Imaging Results for orders placed or performed during the hospital encounter of 03/18/18 (from the past 48 hour(s))  Basic metabolic panel     Status: Abnormal   Collection Time: 03/18/18  6:29 PM  Result Value Ref Range   Sodium 140 135 - 145 mmol/L   Potassium 2.9 (L) 3.5 - 5.1 mmol/L   Chloride 98 98 - 111 mmol/L   CO2 28 22 - 32 mmol/L   Glucose, Bld 107 (H) 70 - 99 mg/dL   BUN 10 8 - 23 mg/dL   Creatinine, Ser 1.01 (H) 0.44 - 1.00 mg/dL   Calcium 9.0 8.9 - 10.3 mg/dL   GFR calc  non Af Amer 46 (L) >60 mL/min   GFR calc Af Amer 54 (L) >60 mL/min   Anion gap 14 5 - 15    Comment: Performed at [facility], [place] [address], [place], [place] [address]  CBC with Differential     Status: Abnormal   Collection Time: 03/18/18  6:29 PM  Result Value Ref Range   WBC 8.1 4.0 - 10.5 K/uL   RBC 3.42 (L) 3.87 - 5.11 MIL/uL   Hemoglobin 11.3 (L) 12.0 - 15.0 g/dL   HCT 35.1 (L) 36.0 - 46.0 %   MCV 102.6 (H) 80.0 - 100.0 fL   MCH 33.0 26.0 - 34.0 pg   MCHC 32.2 30.0 - 36.0 g/dL   RDW 12.2 11.5 - 15.5 %   Platelets 181 150 - 400 K/uL   nRBC 0.0 0.0 - 0.2 %   Neutrophils Relative % 68 %   Neutro Abs 5.5 1.7 - 7.7 K/uL   Lymphocytes Relative 16 %   Lymphs Abs 1.3 0.7 - 4.0 K/uL   Monocytes Relative 10 %   Monocytes Absolute 0.8 0.1 - 1.0 K/uL   Eosinophils Relative 5 %   Eosinophils Absolute 0.4 0.0 - 0.5 K/uL   Basophils Relative 1 %   Basophils Absolute 0.1 0.0 - 0.1 K/uL   Immature Granulocytes 0 %   Abs Immature Granulocytes 0.03 0.00 - 0.07 K/uL    Comment: Performed at [facility], [place] [address] [place], [place] [address]  Troponin I - Add-On to previous collection     Status: None   Collection Time: 03/18/18  6:29 PM  Result Value Ref Range   Troponin I <0.03 <0.03 ng/mL    Comment: Performed at [facility], [place] [address] [place], [place] [address]  Brain natriuretic peptide     Status: Abnormal   Collection Time: 03/18/18  6:29 PM  Result Value Ref Range   B Natriuretic Peptide 141.3 (H) 0.0 - 100.0 pg/mL    Comment: Performed at [facility], [place] [address] [place],  [address]  I-Stat CG4 Lactic Acid, ED     Status: Abnormal   Collection Time: 03/18/18  6:36 PM  Result Value Ref Range   Lactic Acid, Venous 2.04 (HH) 0.5 - 1.9 mmol/L   Comment NOTIFIED PHYSICIAN   I-Stat CG4 Lactic Acid, ED     Status: Abnormal   Collection Time: 03/18/18  8:53 PM  Result Value Ref  Range   Lactic Acid, Venous 2.39 (HH) 0.5 - 1.9 mmol/L   Comment NOTIFIED PHYSICIAN   Influenza  panel by PCR (type A & B)     Status: None   Collection Time: 03/19/18  4:20 AM  Result Value Ref Range   Influenza A By PCR NEGATIVE NEGATIVE   Influenza B By PCR NEGATIVE NEGATIVE    Comment: (NOTE) The Xpert Xpress Flu assay is intended as an aid in the diagnosis of  influenza and should not be used as a sole basis for treatment.  This  assay is FDA approved for nasopharyngeal swab specimens only. Nasal  washings and aspirates are unacceptable for Xpert Xpress Flu testing. Performed at [facility], [place] [address], [place], [place] [address]   Magnesium     Status: None   Collection Time: 03/19/18  5:51 AM  Result Value Ref Range   Magnesium 2.2 1.7 - 2.4 mg/dL    Comment: Performed at [facility], [place] [address] [place], [place] [address]  Lactic acid, plasma     Status: Abnormal   Collection Time: 03/19/18  6:25 AM  Result Value Ref Range   Lactic Acid, Venous 5.2 (HH) 0.5 - 1.9 mmol/L    Comment: CRITICAL RESULT CALLED TO, READ BACK BY AND VERIFIED WITH: SMITH,T. RN AT [phone] 03/19/18 MULLINS,T Performed at [facility], [place] [address] [place], [place] [address]   Urinalysis, Routine w reflex microscopic     Status: Abnormal   Collection Time: 03/19/18  8:41 AM  Result Value Ref Range   Color, Urine YELLOW YELLOW   APPearance CLEAR CLEAR   Specific Gravity, Urine 1.035 (H) 1.005 - 1.030   pH 7.0 5.0 - 8.0   Glucose, UA NEGATIVE NEGATIVE mg/dL   Hgb urine dipstick NEGATIVE NEGATIVE   Bilirubin Urine NEGATIVE NEGATIVE   Ketones, ur NEGATIVE NEGATIVE mg/dL   Protein, ur NEGATIVE NEGATIVE mg/dL   Nitrite NEGATIVE NEGATIVE   Leukocytes, UA NEGATIVE NEGATIVE    Comment: Performed at [place] [address] [place], [place] [address]  CBC with Differential/Platelet     Status: Abnormal    Collection Time: 03/19/18  8:45 AM  Result Value Ref Range   WBC 5.1 4.0 - 10.5 K/uL   RBC 3.58 (L) 3.87 - 5.11 MIL/uL   Hemoglobin 11.5 (L) 12.0 - 15.0 g/dL   HCT 36.1 36.0 - 46.0 %   MCV 100.8 (H) 80.0 - 100.0 fL   MCH 32.1 26.0 - 34.0 pg   MCHC 31.9 30.0 - 36.0 g/dL   RDW 12.2 11.5 - 15.5 %   Platelets 189 150 - 400 K/uL   nRBC 0.0 0.0 - 0.2 %   Neutrophils Relative % 89 %   Neutro Abs 4.5 1.7 - 7.7 K/uL   Lymphocytes Relative 7 %   Lymphs Abs 0.4 (L) 0.7 - 4.0 K/uL   Monocytes Relative 1 %   Monocytes Absolute 0.1 0.1 - 1.0 K/uL   Eosinophils Relative 3 %   Eosinophils Absolute 0.1 0.0 - 0.5 K/uL   Basophils Relative 0 %   Basophils Absolute 0.0 0.0 - 0.1 K/uL   Immature Granulocytes 0 %   Abs Immature Granulocytes 0.02 0.00 - 0.07 K/uL    Comment: Performed at [facility], [place] [address], [place], [place] [address]  Comprehensive metabolic panel     Status: Abnormal   Collection Time: 03/19/18  8:45 AM  Result Value Ref Range   Sodium 138 135 - 145 mmol/L   Potassium 5.1 3.5 - 5.1 mmol/L  Comment: DELTA CHECK NOTED REPEATED TO VERIFY NO VISIBLE HEMOLYSIS    Chloride 101 98 - 111 mmol/L   CO2 25 22 - 32 mmol/L   Glucose, Bld 175 (H) 70 - 99 mg/dL   BUN 11 8 - 23 mg/dL   Creatinine, Ser 1.13 (H) 0.44 - 1.00 mg/dL   Calcium 9.3 8.9 - 10.3 mg/dL   Total Protein 6.6 6.5 - 8.1 g/dL   Albumin 3.2 (L) 3.5 - 5.0 g/dL   AST 27 15 - 41 U/L   ALT 13 0 - 44 U/L   Alkaline Phosphatase 63 38 - 126 U/L   Total Bilirubin 0.8 0.3 - 1.2 mg/dL   GFR calc non Af Amer 40 (L) >60 mL/min   GFR calc Af Amer 47 (L) >60 mL/min   Anion gap 12 5 - 15    Comment: Performed at [facility], [place] [address], [place], [place] [address]  Lactic acid, plasma     Status: Abnormal   Collection Time: 03/19/18 11:48 AM  Result Value Ref Range   Lactic Acid, Venous 4.2 (HH) 0.5 - 1.9 mmol/L    Comment: CRITICAL RESULT CALLED TO, READ BACK BY AND VERIFIED  WITH: SMITH,T. RN AT 1229 03/19/18 MULLINS,T Performed at [facility], [place] [address], [place], [place] [address]    Dg Chest 2 View  Result Date: 03/18/2018 CLINICAL DATA:  Cough EXAM: CHEST - 2 VIEW COMPARISON:  February 25, 2018 FINDINGS: There is a small left pleural effusion with mild left base atelectasis. There is no frank edema or consolidation. Heart is upper normal in size with pulmonary vascularity normal. Pacemaker leads are attached to the right atrium right ventricle. There is aortic atherosclerosis. No adenopathy. No bone lesions. IMPRESSION: Small left pleural effusion with left base atelectasis. No edema or consolidation. Stable cardiac silhouette. Pacemaker leads attached to right atrium and right ventricle. There is aortic atherosclerosis. Aortic Atherosclerosis (ICD10-I70.0). Electronically Signed   By: Lowella Grip III M.D.   On: 03/18/2018 20:01   Ct Angio Chest Pe W/cm &/or Wo Cm  Result Date: 03/18/2018 CLINICAL DATA:  Shortness of breath. EXAM: CT ANGIOGRAPHY CHEST WITH CONTRAST TECHNIQUE: Multidetector CT imaging of the chest was performed using the standard protocol during bolus administration of intravenous contrast. Multiplanar CT image reconstructions and MIPs were obtained to evaluate the vascular anatomy. CONTRAST:  46mL ISOVUE-370 IOPAMIDOL (ISOVUE-370) INJECTION 76% COMPARISON:  Chest radiographs obtained earlier today. Chest CT dated 01/19/2016. FINDINGS: Cardiovascular: Normally opacified pulmonary arteries with no pulmonary arterial filling defects seen. Atheromatous calcifications, including the coronary arteries and aorta. Enlarged heart due to left atrial and left ventricular enlargement. Mediastinum/Nodes: No enlarged mediastinal, hilar, or axillary lymph nodes. Thyroid gland, trachea, and esophagus demonstrate no significant findings. Small hiatal hernia. Lungs/Pleura: Minimal bilateral dependent atelectasis. Interval minimal patchy  prominence of the interstitial markings. Small calcified granulomata in both lower lobes. Prominent epicardial and extrapleural fat on the left, simulating the small left pleural effusion suspected on the radiographs earlier today. No pleural fluid is seen. Upper Abdomen: Cholecystectomy clips. Atheromatous arterial calcifications. Partially included right renal cyst. Musculoskeletal: Thoracic spine degenerative changes and mild scoliosis. Review of the MIP images confirms the above findings. IMPRESSION: 1. No pulmonary emboli. 2. Minimal interstitial pulmonary edema. 3.  Calcific coronary artery and aortic atherosclerosis. Aortic Atherosclerosis (ICD10-I70.0). Electronically Signed   By: Claudie Revering M.D.   On: 03/18/2018 22:10   None  Pending Labs Unresulted Labs (From admission, onward)  Start     Ordered   03/20/18 0500  CBC with Differential/Platelet  Tomorrow morning,   R     03/19/18 1133   03/20/18 0500  Comprehensive metabolic panel  Tomorrow morning,   R     03/19/18 1133   03/20/18 0500  Magnesium  Tomorrow morning,   R     03/19/18 1133   03/19/18 0112  Culture, sputum-assessment  Once,   R     03/19/18 0112          Vitals/Pain Today's Vitals   03/19/18 1230 03/19/18 1245 03/19/18 1300 03/19/18 1411  BP: (!) 137/58 (!) 150/60 (!) 145/54 (!) 152/68  Pulse: 68 69 69 71  Resp: 20 (!) 26 (!) 26 (!) 27  Temp:      TempSrc:      SpO2: 96% 97% 96% 95%  Weight:      PainSc:        Isolation Precautions No active isolations  Medications Medications  albuterol (PROVENTIL,VENTOLIN) solution continuous neb ( Nebulization Canceled Entry 03/19/18 0727)  albuterol (PROVENTIL) (2.5 MG/3ML) 0.083% nebulizer solution 2.5 mg (2.5 mg Nebulization Given 03/19/18 0058)  dextromethorphan-guaiFENesin ([place] DM) 30-600 MG per 12 hr tablet 1 tablet (1 tablet Oral Given 03/19/18 1148)  methylPREDNISolone sodium succinate (SOLU-MEDROL) 125 mg/2 mL injection 60 mg (60 mg Intravenous  Given 03/19/18 1408)  amiodarone (PACERONE) tablet 100 mg (100 mg Oral Given 03/19/18 1149)  furosemide (LASIX) tablet 40 mg (40 mg Oral Given 03/19/18 0748)  hydrALAZINE (APRESOLINE) tablet 25 mg (25 mg Oral Given 03/19/18 1407)  isosorbide mononitrate (IMDUR) 24 hr tablet 60 mg (60 mg Oral Given 03/19/18 1146)  pravastatin (PRAVACHOL) tablet 40 mg (40 mg Oral Given 03/19/18 1148)  escitalopram (LEXAPRO) tablet 15 mg (15 mg Oral Given 03/19/18 1150)  levothyroxine (SYNTHROID, LEVOTHROID) tablet 50 mcg (50 mcg Oral Given 03/19/18 0553)  docusate sodium (COLACE) capsule 100 mg (has no administration in time range)  famotidine (PEPCID) tablet 20 mg (20 mg Oral Given 03/19/18 1146)  pantoprazole (PROTONIX) EC tablet 40 mg (40 mg Oral Given 03/19/18 1333)  clopidogrel (PLAVIX) tablet 75 mg (75 mg Oral Given 03/19/18 0748)  ferrous sulfate tablet 325 mg (325 mg Oral Given 03/19/18 1336)  vitamin E capsule 400 Units (400 Units Oral Given 03/19/18 1333)  potassium chloride SA (K-DUR,KLOR-CON) CR tablet 20 mEq (20 mEq Oral Given 03/19/18 1151)  tetrahydrozoline 0.05 % ophthalmic solution 1 drop (has no administration in time range)  enoxaparin (LOVENOX) injection 40 mg (40 mg Subcutaneous Given 03/19/18 1153)  azithromycin (ZITHROMAX) tablet 500 mg (500 mg Oral Given 03/19/18 0142)    Followed by  azithromycin (ZITHROMAX) tablet 250 mg (has no administration in time range)  polyethylene glycol (MIRALAX / GLYCOLAX) packet 17 g (has no administration in time range)  hydrALAZINE (APRESOLINE) injection 5 mg (has no administration in time range)  acetaminophen (TYLENOL) tablet 650 mg (has no administration in time range)  zolpidem (AMBIEN) tablet 5 mg (has no administration in time range)  hydrOXYzine (ATARAX/VISTARIL) tablet 10 mg (has no administration in time range)  ipratropium-albuterol (DUONEB) 0.5-2.5 (3) MG/3ML nebulizer solution 3 mL (3 mLs Nebulization Given 03/19/18 1340)   mometasone-formoterol (DULERA) 100-5 MCG/ACT inhaler 2 puff (2 puffs Inhalation Not Given 03/19/18 1243)  iopamidol (ISOVUE-370) 76 % injection 80 mL ( Intravenous Canceled Entry 03/19/18 0732)  sodium chloride (PF) 0.9 % injection (  Given by Other 03/19/18 0036)  methylPREDNISolone sodium succinate (SOLU-MEDROL) 125 mg/2 mL injection 125 mg (  125 mg Intravenous Given 03/18/18 2254)  potassium chloride 10 mEq in 100 mL IVPB (0 mEq Intravenous Stopped 03/19/18 0225)  potassium chloride 20 MEQ/15ML (10%) solution 40 mEq (40 mEq Oral Given 03/18/18 2303)  magnesium sulfate IVPB 1 g 100 mL (0 g Intravenous Stopped 03/19/18 0039)    Mobility walks

## 2018-03-19 NOTE — ED Notes (Signed)
Report given to Romie Minus for [place], Room [phone].

## 2018-03-19 NOTE — ED Notes (Signed)
Speech Therapy at bedside

## 2018-03-19 NOTE — ED Notes (Signed)
Speech therapy is at bedside.

## 2018-03-20 ENCOUNTER — Observation Stay (HOSPITAL_BASED_OUTPATIENT_CLINIC_OR_DEPARTMENT_OTHER): Payer: PPO

## 2018-03-20 DIAGNOSIS — D509 Iron deficiency anemia, unspecified: Secondary | ICD-10-CM | POA: Diagnosis present

## 2018-03-20 DIAGNOSIS — J9621 Acute and chronic respiratory failure with hypoxia: Secondary | ICD-10-CM | POA: Diagnosis present

## 2018-03-20 DIAGNOSIS — N179 Acute kidney failure, unspecified: Secondary | ICD-10-CM | POA: Diagnosis present

## 2018-03-20 DIAGNOSIS — I5032 Chronic diastolic (congestive) heart failure: Secondary | ICD-10-CM | POA: Diagnosis not present

## 2018-03-20 DIAGNOSIS — E872 Acidosis: Secondary | ICD-10-CM | POA: Diagnosis present

## 2018-03-20 DIAGNOSIS — E039 Hypothyroidism, unspecified: Secondary | ICD-10-CM | POA: Diagnosis present

## 2018-03-20 DIAGNOSIS — E785 Hyperlipidemia, unspecified: Secondary | ICD-10-CM | POA: Diagnosis present

## 2018-03-20 DIAGNOSIS — R0602 Shortness of breath: Secondary | ICD-10-CM | POA: Diagnosis present

## 2018-03-20 DIAGNOSIS — Z955 Presence of coronary angioplasty implant and graft: Secondary | ICD-10-CM | POA: Diagnosis not present

## 2018-03-20 DIAGNOSIS — Z79899 Other long term (current) drug therapy: Secondary | ICD-10-CM | POA: Diagnosis not present

## 2018-03-20 DIAGNOSIS — I361 Nonrheumatic tricuspid (valve) insufficiency: Secondary | ICD-10-CM

## 2018-03-20 DIAGNOSIS — G9341 Metabolic encephalopathy: Secondary | ICD-10-CM | POA: Diagnosis present

## 2018-03-20 DIAGNOSIS — I130 Hypertensive heart and chronic kidney disease with heart failure and stage 1 through stage 4 chronic kidney disease, or unspecified chronic kidney disease: Secondary | ICD-10-CM | POA: Diagnosis present

## 2018-03-20 DIAGNOSIS — I5033 Acute on chronic diastolic (congestive) heart failure: Secondary | ICD-10-CM | POA: Diagnosis present

## 2018-03-20 DIAGNOSIS — F329 Major depressive disorder, single episode, unspecified: Secondary | ICD-10-CM | POA: Diagnosis present

## 2018-03-20 DIAGNOSIS — K219 Gastro-esophageal reflux disease without esophagitis: Secondary | ICD-10-CM | POA: Diagnosis present

## 2018-03-20 DIAGNOSIS — J441 Chronic obstructive pulmonary disease with (acute) exacerbation: Secondary | ICD-10-CM | POA: Diagnosis present

## 2018-03-20 DIAGNOSIS — I350 Nonrheumatic aortic (valve) stenosis: Secondary | ICD-10-CM | POA: Diagnosis present

## 2018-03-20 DIAGNOSIS — N183 Chronic kidney disease, stage 3 (moderate): Secondary | ICD-10-CM | POA: Diagnosis present

## 2018-03-20 DIAGNOSIS — I2510 Atherosclerotic heart disease of native coronary artery without angina pectoris: Secondary | ICD-10-CM | POA: Diagnosis present

## 2018-03-20 DIAGNOSIS — I480 Paroxysmal atrial fibrillation: Secondary | ICD-10-CM | POA: Diagnosis present

## 2018-03-20 DIAGNOSIS — Z7902 Long term (current) use of antithrombotics/antiplatelets: Secondary | ICD-10-CM | POA: Diagnosis not present

## 2018-03-20 DIAGNOSIS — I272 Pulmonary hypertension, unspecified: Secondary | ICD-10-CM | POA: Diagnosis present

## 2018-03-20 DIAGNOSIS — J849 Interstitial pulmonary disease, unspecified: Secondary | ICD-10-CM | POA: Diagnosis present

## 2018-03-20 DIAGNOSIS — Z79890 Hormone replacement therapy (postmenopausal): Secondary | ICD-10-CM | POA: Diagnosis not present

## 2018-03-20 DIAGNOSIS — E876 Hypokalemia: Secondary | ICD-10-CM | POA: Diagnosis present

## 2018-03-20 DIAGNOSIS — Z950 Presence of cardiac pacemaker: Secondary | ICD-10-CM | POA: Diagnosis not present

## 2018-03-20 LAB — ECHOCARDIOGRAM COMPLETE
Height: 60 in
Weight: 2417.6 oz

## 2018-03-20 LAB — COMPREHENSIVE METABOLIC PANEL
ALK PHOS: 57 U/L (ref 38–126)
ALT: 13 U/L (ref 0–44)
AST: 23 U/L (ref 15–41)
Albumin: 3.5 g/dL (ref 3.5–5.0)
Anion gap: 9 (ref 5–15)
BUN: 21 mg/dL (ref 8–23)
CHLORIDE: 100 mmol/L (ref 98–111)
CO2: 29 mmol/L (ref 22–32)
Calcium: 9.6 mg/dL (ref 8.9–10.3)
Creatinine, Ser: 1.37 mg/dL — ABNORMAL HIGH (ref 0.44–1.00)
GFR calc Af Amer: 37 mL/min — ABNORMAL LOW (ref >60)
GFR calc non Af Amer: 32 mL/min — ABNORMAL LOW (ref >60)
Glucose, Bld: 169 mg/dL — ABNORMAL HIGH (ref 70–99)
Potassium: 5.8 mmol/L — ABNORMAL HIGH (ref 3.5–5.1)
Sodium: 138 mmol/L (ref 135–145)
TOTAL PROTEIN: 6.3 g/dL — AB (ref 6.5–8.1)
Total Bilirubin: 0.6 mg/dL (ref 0.3–1.2)

## 2018-03-20 LAB — CBC WITH DIFFERENTIAL/PLATELET
Abs Immature Granulocytes: 0.06 10*3/uL (ref 0.00–0.07)
Basophils Absolute: 0 10*3/uL (ref 0.0–0.1)
Basophils Relative: 0 %
EOS PCT: 0 %
Eosinophils Absolute: 0 10*3/uL (ref 0.0–0.5)
HCT: 33.3 % — ABNORMAL LOW (ref 36.0–46.0)
Hemoglobin: 10.6 g/dL — ABNORMAL LOW (ref 12.0–15.0)
Immature Granulocytes: 1 %
LYMPHS PCT: 4 %
Lymphs Abs: 0.6 10*3/uL — ABNORMAL LOW (ref 0.7–4.0)
MCH: 32.5 pg (ref 26.0–34.0)
MCHC: 31.8 g/dL (ref 30.0–36.0)
MCV: 102.1 fL — ABNORMAL HIGH (ref 80.0–100.0)
MONO ABS: 0.2 10*3/uL (ref 0.1–1.0)
Monocytes Relative: 2 %
Neutro Abs: 11.7 10*3/uL — ABNORMAL HIGH (ref 1.7–7.7)
Neutrophils Relative %: 93 %
Platelets: 205 10*3/uL (ref 150–400)
RBC: 3.26 MIL/uL — ABNORMAL LOW (ref 3.87–5.11)
RDW: 12.3 % (ref 11.5–15.5)
WBC: 12.6 10*3/uL — ABNORMAL HIGH (ref 4.0–10.5)
nRBC: 0 % (ref 0.0–0.2)

## 2018-03-20 LAB — MAGNESIUM: MAGNESIUM: 2.6 mg/dL — AB (ref 1.7–2.4)

## 2018-03-20 LAB — GLUCOSE, CAPILLARY: GLUCOSE-CAPILLARY: 149 mg/dL — AB (ref 70–99)

## 2018-03-20 MED ORDER — HEPARIN SODIUM (PORCINE) 5000 UNIT/ML IJ SOLN
5000.0000 [IU] | Freq: Three times a day (TID) | INTRAMUSCULAR | Status: DC
  Administered 2018-03-20 – 2018-03-22 (×6): 5000 [IU] via SUBCUTANEOUS
  Filled 2018-03-20 (×6): qty 1

## 2018-03-20 MED ORDER — FUROSEMIDE 10 MG/ML IJ SOLN
40.0000 mg | Freq: Once | INTRAMUSCULAR | Status: AC
  Administered 2018-03-20: 40 mg via INTRAVENOUS
  Filled 2018-03-20: qty 4

## 2018-03-20 NOTE — Progress Notes (Signed)
  Echocardiogram 2D Echocardiogram has been performed.  Jannett Celestine 03/20/2018, 2:14 PM

## 2018-03-20 NOTE — Care Management Obs Status (Signed)
[place] NOTIFICATION   Patient Details  Name: Laura Zuniga MRN: [id number] Date of Birth: June 09, 1917   Medicare Observation Status Notification Given:  Yes    Purcell Mouton, RN 03/20/2018, 11:05 AM

## 2018-03-20 NOTE — Progress Notes (Signed)
PROGRESS NOTE  Laura Zuniga KVQ:[id number] DOB: 1917-08-17 DOA: 03/18/2018 PCP: Wenda Low, MD  Brief Summary:  Per ED intake "Pt BIB EMS from home.Pt has been diagnosed with bronchitis and has been on medications. According to family the patient got choked up today and became [facility]. EMS heard expiratory wheezes and gave her albuterol and Atrovent treatment.     HPI/Recap of past 24 hours:  She was agitated last night, not able to go to sleep, she received Lorrin Mais has been in sleep since She does open eyes to voice, then drift back to sleep No cough noticed during encounter Daughter reports patient was treated for bronchitis with abx and steroids x3 in the past 8 weeks Patient did not improved, she cough and choked, she became confused, daughter brought her to the hospital  Daughter reports patient base weight is around 149, her weight is 151 on presentation, on exam there is no edema  Daughter request pacemaker being interrogated while she is in the hospital  Assessment/Plan: Principal Problem:   COPD exacerbation ([place]) Active Problems:   HLD (hyperlipidemia)   Depression   Essential hypertension   CAD (coronary artery disease)   PAF (paroxysmal atrial fibrillation) (H[facility])   Chronic diastolic heart failure (H[facility])   GERD (gastroesophageal reflux disease)   CKD (chronic kidney disease), stage III (H[facility])   Cardiac pacemaker   ILD (interstitial lung disease) ([place])   Hypothyroidism   Hypokalemia   Lactic acidosis   Acute on chronic diastolic heart failure -2D echo on 09/28/2013 showed EF of 55-60%.   -BNP 141.3.  troponin negative -Chest x-ray has no pulmonary edema, CT angiogram  No PE but showed mild interstitial edema.   -Patient has trace leg edema but no JVD. Per admitting MD and EDP, patient has diffuse wheezing initially, today I did not hear wheezing today -weight slightly up from base weight per family -repeat echocardiogram -continue lasix, consult  cardiology,   ILD (interstitial lung disease)/Pulmonary HTN -I have reviewed Dr Lamonte Sakai note, per pulmonology Dr Lamonte Sakai note from 2017, patient does not appear to have copd, but does has ILD , per DR Byrum Etiology could be secondary to initial higher dosing of amiodarone-now on low-dose amiodarone -Echo 2015: PA pressure 52 mmHg --flu swab negative  there is also concern for dysphagia for which she has MBS in 02/2016 at the time reports was mild aspiration risk Daughter also reports patient has h/o hiatal hernia Daughter reports patient has intermittent trouble with swallowing, she choked on her own mucus prior to coming to the ED Consider dg esophagus once patient is more awake  Hypokalemia: k 2.9 on presentation, replaced  Lactic acidosis: Lactic acid 2.04, 2.39.  No fever or leukocytosis.  Does not seem to have sepsis From tissue hypoxia?   Acute metabolic encephalopathy: Family reports baseline mild dementia, but patient appear more confused the morning prior to coming to the ED Per family patient was agitated last night, not able to sleep, she received ambien at 0045am, has been very drowsy today, family request to d/c ambien    CAD: s/p of stent. No CP -Continue Plavix, pravastatin, Imdur  H/o Afib/bradycardia s/p pacemaker placement Per cardiology Dr Maricela Curet "Not thought to be a good long-term anticoagulation candidate" She is on plavix Discontinue amiodarone? Cardiology consulted  Daughter also request pacemaker to be interrogated  Code Status: full  Family Communication: patient and daughter at bedside  Disposition Plan: not ready to discharge   Consultants:  cardiology  Procedures:  none  Antibiotics:  zithromax   Objective: BP (!) 150/59   Pulse 76   Temp 97.7 F (36.5 C)   Resp 18   Ht 5' (1.524 m)   Wt 68.5 kg   SpO2 98%   BMI 29.51 kg/m   Intake/Output Summary (Last 24 hours) at 03/20/2018 1203 Last data filed at 03/20/2018 0600 Gross  per 24 hour  Intake 480 ml  Output -  Net 480 ml   Filed Weights   03/18/18 1807 03/19/18 1845  Weight: 67.6 kg 68.5 kg    Exam: Patient is examined daily including today on 03/20/2018, exams remain the same as of yesterday except that has changed    General:  Lethargic ,open eyes to voice, then drift back to sleep  Cardiovascular: paced rhythm  Respiratory: mild bibasilar crackles, no wheezing  Abdomen: Soft/ND/NT, positive BS  Musculoskeletal: No Edema, does has chronic venous stasis skin changes  Neuro: lethargic  Data Reviewed: Basic Metabolic Panel: Recent Labs  Lab 03/18/18 1829 03/19/18 0551 03/19/18 0845 03/20/18 0504  NA 140  --  138 138  K 2.9*  --  5.1 5.8*  CL 98  --  101 100  CO2 28  --  25 29  GLUCOSE 107*  --  175* 169*  BUN 10  --  11 21  CREATININE 1.01*  --  1.13* 1.37*  CALCIUM 9.0  --  9.3 9.6  MG  --  2.2  --  2.6*   Liver Function Tests: Recent Labs  Lab 03/19/18 0845 03/20/18 0504  AST 27 23  ALT 13 13  ALKPHOS 63 57  BILITOT 0.8 0.6  PROT 6.6 6.3*  ALBUMIN 3.2* 3.5   No results for input(s): LIPASE, AMYLASE in the last 168 hours. No results for input(s): AMMONIA in the last 168 hours. CBC: Recent Labs  Lab 03/18/18 1829 03/19/18 0845 03/20/18 0504  WBC 8.1 5.1 12.6*  NEUTROABS 5.5 4.5 11.7*  HGB 11.3* 11.5* 10.6*  HCT 35.1* 36.1 33.3*  MCV 102.6* 100.8* 102.1*  PLT 181 189 205   Cardiac Enzymes:   Recent Labs  Lab 03/18/18 1829  TROPONINI <0.03   BNP (last 3 results) Recent Labs    03/18/18 1829  BNP 141.3*    ProBNP (last 3 results) No results for input(s): PROBNP in the last 8760 hours.  CBG: No results for input(s): GLUCAP in the last 168 hours.  No results found for this or any previous visit (from the past 240 hour(s)).   Studies: No results found.  Scheduled Meds: . amiodarone  100 mg Oral Daily  . azithromycin  250 mg Oral Daily  . clopidogrel  75 mg Oral Q breakfast  .  dextromethorphan-guaiFENesin  1 tablet Oral BID  . enoxaparin (LOVENOX) injection  40 mg Subcutaneous Q24H  . escitalopram  15 mg Oral Daily  . famotidine  20 mg Oral Daily  . ferrous sulfate  325 mg Oral QPC lunch  . furosemide  40 mg Intravenous Once  . furosemide  40 mg Oral BID  . hydrALAZINE  25 mg Oral Q8H  . ipratropium-albuterol  3 mL Nebulization QID  . isosorbide mononitrate  60 mg Oral Daily  . levothyroxine  50 mcg Oral QAC breakfast  . methylPREDNISolone (SOLU-MEDROL) injection  60 mg Intravenous Q8H  . mometasone-formoterol  2 puff Inhalation BID  . pantoprazole  40 mg Oral QPC lunch  . pravastatin  40 mg Oral Daily  . vitamin E  400 Units  Oral QPC lunch    Continuous Infusions:   Time spent: 56mins I have personally reviewed and interpreted on  03/20/2018 daily labs, tele strips, imagings as discussed above under date review session and assessment and plans.  I reviewed all nursing notes, pharmacy notes, consultant notes,  vitals, pertinent old records  I have discussed plan of care as described above with RN , patient and family on 03/20/2018   Florencia Reasons MD, PhD  [facility] Pager [phone]. If 7PM-7AM, please contact night-coverage at [email or web address], password [facility] 03/20/2018, 12:03 PM  LOS: 0 days

## 2018-03-20 NOTE — Progress Notes (Signed)
At 1500, patient remains lethargic but will arouse to voice. Patient not alert enough to be able to take PO medications at this time. Informed family at the bedside why she had not received her PO medications. Patient did receive IV Lasix. Passed on to oncoming RN that patient had not received her meds and to admin once fully awake.

## 2018-03-20 NOTE — Progress Notes (Signed)
Pharmacy Brief Note:  Order for enoxaparin 40 mg subcutaneously daily changed to heparin 5000 units subQ q8h for DVT ppx given CrCl < 30 mL/min. Per protocol.   Lenis Noon, PharmD 03/20/18 1:24 PM

## 2018-03-20 NOTE — Progress Notes (Signed)
Have been unable to fully assess patient due to lethargy. Patient received Ambien 5mg  at 0045. Patient's daughters at bedside and stated she had not slept for ~24 hours and was given Ambien last night.  RN has been unable to give any of patient's medications d/t lethargy as well. Both daughters requesting patient not receive this medication going forward. Informed MD in person regarding Ambien and delay on medication administration. Will revisit medication administration once patient is fully awake.

## 2018-03-21 LAB — CBC WITH DIFFERENTIAL/PLATELET
Abs Immature Granulocytes: 0.06 10*3/uL (ref 0.00–0.07)
BASOS ABS: 0 10*3/uL (ref 0.0–0.1)
Basophils Relative: 0 %
EOS PCT: 0 %
Eosinophils Absolute: 0 10*3/uL (ref 0.0–0.5)
HEMATOCRIT: 34.8 % — AB (ref 36.0–46.0)
Hemoglobin: 11 g/dL — ABNORMAL LOW (ref 12.0–15.0)
Immature Granulocytes: 1 %
Lymphocytes Relative: 4 %
Lymphs Abs: 0.4 10*3/uL — ABNORMAL LOW (ref 0.7–4.0)
MCH: 33 pg (ref 26.0–34.0)
MCHC: 31.6 g/dL (ref 30.0–36.0)
MCV: 104.5 fL — ABNORMAL HIGH (ref 80.0–100.0)
Monocytes Absolute: 0.1 10*3/uL (ref 0.1–1.0)
Monocytes Relative: 1 %
Neutro Abs: 9.4 10*3/uL — ABNORMAL HIGH (ref 1.7–7.7)
Neutrophils Relative %: 94 %
Platelets: 211 10*3/uL (ref 150–400)
RBC: 3.33 MIL/uL — ABNORMAL LOW (ref 3.87–5.11)
RDW: 12.4 % (ref 11.5–15.5)
WBC: 10 10*3/uL (ref 4.0–10.5)
nRBC: 0 % (ref 0.0–0.2)

## 2018-03-21 LAB — BASIC METABOLIC PANEL
Anion gap: 11 (ref 5–15)
BUN: 27 mg/dL — ABNORMAL HIGH (ref 8–23)
CALCIUM: 9.1 mg/dL (ref 8.9–10.3)
CO2: 29 mmol/L (ref 22–32)
Chloride: 100 mmol/L (ref 98–111)
Creatinine, Ser: 1.4 mg/dL — ABNORMAL HIGH (ref 0.44–1.00)
GFR calc Af Amer: 36 mL/min — ABNORMAL LOW (ref >60)
GFR calc non Af Amer: 31 mL/min — ABNORMAL LOW (ref >60)
Glucose, Bld: 157 mg/dL — ABNORMAL HIGH (ref 70–99)
Potassium: 4.7 mmol/L (ref 3.5–5.1)
Sodium: 140 mmol/L (ref 135–145)

## 2018-03-21 LAB — LACTIC ACID, PLASMA
Lactic Acid, Venous: 2.7 mmol/L (ref 0.5–1.9)
Lactic Acid, Venous: 1.5 mmol/L (ref 0.5–1.9)

## 2018-03-21 LAB — PROCALCITONIN: Procalcitonin: <0.1 ng/mL

## 2018-03-21 MED ORDER — SODIUM CHLORIDE 0.9 % IV BOLUS
500.0000 mL | Freq: Once | INTRAVENOUS | Status: AC
  Administered 2018-03-21: 500 mL via INTRAVENOUS

## 2018-03-21 MED ORDER — DOCUSATE SODIUM 100 MG PO CAPS: 100.0000 mg | ORAL_CAPSULE | Freq: Every day | ORAL | Status: DC | PRN

## 2018-03-21 NOTE — Consult Note (Addendum)
Cardiology Consultation:   Patient ID: AYDIA MAJ MRN: [id number]; DOB: 05/21/1917  Admit date: 03/18/2018 Date of Consult: 03/21/2018  Primary Care Provider: Wenda Low, MD Primary Cardiologist: Larae Grooms, MD  Primary Electrophysiologist:  Cristopher Peru, MD    Patient Profile:   Laura Zuniga is a 84 y.o. female with a hx of presumed CAD (anterior wall abnormality on nuclear imaging in 4401), diastolic CHF, atrial fibrillation not on a/c due to bleed risk, ILD, HTN, HLD, symptomatic bradycardia s/p PPM, stage III CKD and iron deficiency anemia who is being seen today for the evaluation of dyspnea and afib medication managment at the request of Dr. Erlinda Hong, Internal Medicine.   History of Present Illness:    Laura Zuniga is a 84 y.o. female with a hx of presumed CAD (anterior wall abnormality on nuclear imaging in 0272), diastolic CHF, atrial fibrillation not on a/c due to bleed risk, ILD, HTN, HLD, symptomatic bradycardia s/p PPM, stage III CKD and iron deficiency anemia who is being seen today for the evaluation of dyspnea and afib medication managment at the request of Dr. Erlinda Hong, Internal Medicine.   Pt is followed by Dr. Irish Lack. Dr. Lovena Le follows her pacemaker. It is outlined in Dr. Karle Barr last clinic note 02/2017  that pt was taken off of amiodarone in Dec 2016 for bradycardia. F/u Holter monitor 04/2015 showed NSR, sinus brady, junctional rhythm and pause of 2.4 sec. PPM was placed 05/2015. Based on last general cardiology note and last EP clinic note in 2018, the pt was not on amiodarone (not listed in med list and not mentioned in A&P), however there is a telephone note from 02/26/18 stating that refills of amiodarone was sent to pharmacy.   Pt presented to the [facility] ED on 03/18/18 with CC of dyspnea. Symptoms started ~6 weeks ago and progressively worsening over the last 2 weeks. Also associated wheezing and dry cough. Pt was seen by PCP and given antibiotics and  prednisone. She had 3 rounds of antibiotics and prednisone w/o any improvement. No fever or chills.   ED Course: WBC 8.1, lactic acid 2.04, 2.39, negative troponin, BNP 141.3,potassium 2.9, stable renal function, temperature normal, no tachycardia, tachypneic, oxygen saturation 99% on room air.  Chest x-ray showed left small amount of a pleural effusion in the left basilar atelectasis.  CT angiogram chest is negative for PE, but showed mild interstitial edema.   Pt was admitted by IM. Admitting diagnosis was COPD exacerbation in the setting of ILD. Pt was placed on antibiotics, solu-medrol and nebulizer treatments. Given mildly elevated BNP pt was treated with BID PO Lasix.   Cardiology now asked to weigh in regarding amiodarone. ? If it should be discontinued. Of note, per pulmonology records, it was documented in 2017 that Dr. Lamonte Sakai felt that her ILD was possibly secondary to high dose amiodarone.   Past Medical History:  Diagnosis Date  . Bradycardia    a. Amiodarone DC'd 12/16 >> b. Holter 1/17 - NSR, sinus brady, junctional rhythm, pause 2.4 s  . C. difficile colitis 2015  . CAD (coronary artery disease)    never had cardiac cath, CAD dx by positive ant. wall defect on Nuc. study 2007 >>  no cath due to high risk/advanced age  . Chronic diastolic CHF (congestive heart failure) ([place])    a. Echo 6/15 - Mild LVH, EF 55-60%, mild AI, mild MR, moderate LAE, mild RAE, PASP 52 mm Hg  . CKD (chronic kidney disease)   .  COPD (chronic obstructive pulmonary disease) ([place])   . Diverticulitis   . GERD (gastroesophageal reflux disease)   . Hypertension   . Macular degeneration   . PAF (paroxysmal atrial fibrillation) ([facility])    not optimal candidate for anticoagulation    Past Surgical History:  Procedure Laterality Date  . APPENDECTOMY    . CHOLECYSTECTOMY    . EP IMPLANTABLE DEVICE N/A 05/17/2015   Procedure: Pacemaker Implant;  Surgeon: Evans Lance, MD;  Location: [place];   Service: Cardiovascular;  Laterality: N/A;  . ESOPHAGOGASTRODUODENOSCOPY N/A 09/22/2013   Procedure: ESOPHAGOGASTRODUODENOSCOPY (EGD);  Surgeon: Lear Ng, MD;  Location: [facility] ENDOSCOPY;  Service: Endoscopy;  Laterality: N/A;  . ESOPHAGOGASTRODUODENOSCOPY N/A 09/22/2013   Procedure: ESOPHAGOGASTRODUODENOSCOPY (EGD);  Surgeon: Lear Ng, MD;  Location: [facility] ENDOSCOPY;  Service: Endoscopy;  Laterality: N/A;  . EYE SURGERY    . FLEXIBLE SIGMOIDOSCOPY N/A 09/22/2013   Procedure: FLEXIBLE SIGMOIDOSCOPY;  Surgeon: Lear Ng, MD;  Location: [facility] ENDOSCOPY;  Service: Endoscopy;  Laterality: N/A;  . FLEXIBLE SIGMOIDOSCOPY N/A 09/22/2013   Procedure: FLEXIBLE SIGMOIDOSCOPY;  Surgeon: Lear Ng, MD;  Location: [facility] ENDOSCOPY;  Service: Endoscopy;  Laterality: N/A;  unprepped/ egd first  . VAGINAL HYSTERECTOMY       Home Medications:  Prior to Admission medications   Medication Sig Start Date End Date Taking? Authorizing Provider  acetaminophen (TYLENOL) 500 MG tablet Take 2 tablets (1,000 mg total) by mouth every 6 (six) hours as needed for mild pain or moderate pain. 05/27/16  Yes Duffy Bruce, MD  albuterol (PROVENTIL HFA;VENTOLIN HFA) 108 (90 BASE) MCG/ACT inhaler Inhale 2 puffs into the lungs every 6 (six) hours as needed for wheezing or shortness of breath. 02/23/15  Yes Deloria Lair, NP  amiodarone (PACERONE) 100 MG tablet Take 1 tablet (100 mg total) by mouth daily. Keep appointment in March 2020 03/17/18  Yes Jettie Booze, MD  Biotin 1 MG CAPS Take 1 mg by mouth daily.   Yes [provider]  Carboxymethylcellulose Sodium (EYE DROPS OP) Place 1 drop into both eyes daily as needed (dry eyes).   Yes [provider]  clopidogrel (PLAVIX) 75 MG tablet TAKE 1 TABLET BY MOUTH ONCE DAILY. Please make overdue appt with Dr. Irish Lack before anymore refills. 1st attempt 03/17/18  Yes Jettie Booze, MD  Cranberry 250 MG CAPS Take 250 mg by mouth  daily after lunch.    Yes [provider]  docusate sodium (COLACE) 100 MG capsule Take 100 mg by mouth daily as needed for mild constipation.   Yes [provider]  EQ CLEARLAX powder Take 17 g by mouth daily as needed for constipation. 01/08/18  Yes [provider]  escitalopram (LEXAPRO) 10 MG tablet Take 15 mg daily by mouth.    Yes [provider]  famotidine (PEPCID) 20 MG tablet Take 20 mg by mouth daily. 02/20/18  Yes [provider]  ferrous sulfate 325 (65 FE) MG tablet Take 325 mg by mouth daily after lunch.    Yes [provider]  furosemide (LASIX) 80 MG tablet Take 1 tablet by mouth in the morning, 1/2 tablet (40 mg)  in the evening and can take 1/2 tablet daily as needed for swelling   Yes [provider]  hydrALAZINE (APRESOLINE) 25 MG tablet TAKE 1 TABLET BY MOUTH TWICE DAILY Patient taking differently: Take 25 mg by mouth. 2 to 3  Times daily 05/10/17  Yes Jettie Booze, MD  isosorbide mononitrate (IMDUR) 60 MG 24 hr tablet TAKE 1 TABLET BY MOUTH ONCE DAILY 01/07/18  Yes Jettie Booze, MD  levothyroxine (SYNTHROID, LEVOTHROID) 50 MCG tablet Take 50 mcg by mouth daily before breakfast.  06/04/14  Yes [provider]  Multiple Vitamins-Minerals (ICAPS AREDS 2 PO) Take 1 tablet by mouth daily.   Yes [provider]  pantoprazole (PROTONIX) 40 MG tablet Take 40 mg by mouth daily after lunch.    Yes [provider]  potassium chloride SA (K-DUR,KLOR-CON) 20 MEQ tablet Take 20 mEq by mouth 2 (two) times daily.  05/20/14  Yes [provider]  pravastatin (PRAVACHOL) 40 MG tablet Take 40 mg daily by mouth. 01/07/17  Yes [provider]  vitamin E 400 UNIT capsule Take 400 Units by mouth daily after lunch.   Yes [provider]  benzonatate (TESSALON) 100 MG capsule Take 1 capsule (100 mg total) by mouth every 8 (eight) hours. Patient not taking: Reported on 03/18/2018  02/25/18   Vanessa Kick, MD  predniSONE (STERAPRED UNI-PAK 21 TAB) 10 MG (21) TBPK tablet Take by mouth daily. Take as directed. Patient not taking: Reported on 03/18/2018 02/25/18   Vanessa Kick, MD    Inpatient Medications: Scheduled Meds: . amiodarone  100 mg Oral Daily  . azithromycin  250 mg Oral Daily  . clopidogrel  75 mg Oral Q breakfast  . dextromethorphan-guaiFENesin  1 tablet Oral BID  . escitalopram  15 mg Oral Daily  . famotidine  20 mg Oral Daily  . ferrous sulfate  325 mg Oral QPC lunch  . furosemide  40 mg Oral BID  . heparin injection (subcutaneous)  5,000 Units Subcutaneous Q8H  . hydrALAZINE  25 mg Oral Q8H  . ipratropium-albuterol  3 mL Nebulization QID  . isosorbide mononitrate  60 mg Oral Daily  . levothyroxine  50 mcg Oral QAC breakfast  . methylPREDNISolone (SOLU-MEDROL) injection  60 mg Intravenous Q8H  . mometasone-formoterol  2 puff Inhalation BID  . pantoprazole  40 mg Oral QPC lunch  . pravastatin  40 mg Oral Daily  . vitamin E  400 Units Oral QPC lunch   Continuous Infusions: . sodium chloride 500 mL (03/21/18 0648)   PRN Meds: acetaminophen, albuterol, docusate sodium, guaiFENesin-dextromethorphan, hydrALAZINE, hydrOXYzine, polyethylene glycol, tetrahydrozoline  Allergies:    Allergies  Allergen Reactions  . Penicillins Other (See Comments)    Unknown allergic reaction Has patient had a PCN reaction causing immediate rash, facial/tongue/throat swelling, SOB or lightheadedness with hypotension: unknown Has patient had a PCN reaction causing severe rash involving mucus membranes or skin necrosis: unknown Has patient had a PCN reaction that required hospitalization: unknown Has patient had a PCN reaction occurring within the last 10 years: unknown If all of the above answers are "NO", then may proceed with Cephalosporin use.   . 2,4-D Dimethylamine (Amisol) Other (See Comments)  . Ace Inhibitors Other (See Comments)  . Lanolin Hives  . Sulfa  Antibiotics   . Vancomycin     Red man syndrome  . Sulfonamide Derivatives Swelling and Rash    Social History:   Social History   Socioeconomic History  . Marital status: Widowed    Spouse name: Not on file  . Number of children: Not on file  . Years of education: Not on file  . Highest education level: Not on file  Occupational History  . Not on file  Social Needs  . Financial resource strain: Not on file  . Food  insecurity:    Worry: Not on file    Inability: Not on file  . Transportation needs:    Medical: Not on file    Non-medical: Not on file  Tobacco Use  . Smoking status: Never Smoker  . Smokeless tobacco: Never Used  Substance and Sexual Activity  . Alcohol use: No    Alcohol/week: 0.0 standard drinks  . Drug use: No  . Sexual activity: Never  Lifestyle  . Physical activity:    Days per week: Not on file    Minutes per session: Not on file  . Stress: Not on file  Relationships  . Social connections:    Talks on phone: Not on file    Gets together: Not on file    Attends religious service: Not on file    Active member of club or organization: Not on file    Attends meetings of clubs or organizations: Not on file    Relationship status: Not on file  . Intimate partner violence:    Fear of current or ex partner: Not on file    Emotionally abused: Not on file    Physically abused: Not on file    Forced sexual activity: Not on file  Other Topics Concern  . Not on file  Social History Narrative        father died age 4: Mass[place] MI         mother died age 41: gallbladder surgery complications         4 brothers: all deceased; prostate ca, throat ca, emphesema, MI, cirrhosis from ETOH         3 sisters: one living: MI, emphesema, 2 sisters with breast CA    Family History:    Family History  Problem Relation Age of Onset  . Heart attack Father 83  . Heart attack Sister   . Cancer Brother   . Cancer Brother   . Cirrhosis Brother   . Heart disease  Brother   . Cancer Brother   . COPD Brother   . COPD Sister   . Cancer Sister   . Cancer Sister   . Hypertension Daughter   . Stroke Neg Hx      ROS:  Please see the history of present illness.  All other ROS reviewed and negative.     Physical Exam/Data:   Vitals:   03/20/18 1940 03/20/18 2159 03/21/18 0613 03/21/18 0614  BP:  (!) 151/54 (!) 143/54   Pulse:  68 63   Resp:  16 16   Temp:  98.2 F (36.8 C) 98.2 F (36.8 C)   TempSrc:  Oral Oral   SpO2: 96% 97% 91% 97%  Weight:      Height:        Intake/Output Summary (Last 24 hours) at 03/21/2018 0745 Last data filed at 03/21/2018 0600 Gross per 24 hour  Intake 240 ml  Output -  Net 240 ml   Filed Weights   03/18/18 1807 03/19/18 1845  Weight: 67.6 kg 68.5 kg   Body mass index is 29.51 kg/m.  General:  Elderly WF, Well nourished, well developed, in no acute distress HEENT: normal Lymph: no adenopathy Neck: no JVD Endocrine:  No thryomegaly Vascular: No carotid bruits; FA pulses 2+ bilaterally without bruits  Cardiac:  normal S1, S2; RRR; no murmur  Lungs:  diffuse inspiratory and expiratory wheezing bilaterally Abd: soft, nontender, no hepatomegaly  Ext: no edema Musculoskeletal:  No deformities, BUE and BLE strength normal  and equal Skin: warm and dry  Neuro:  CNs 2-12 intact, no focal abnormalities noted Psych:  Normal affect   EKG:  The EKG was personally reviewed and demonstrates:  Atrial paced  Telemetry:  Telemetry was personally reviewed and demonstrates:  Atrial paced rhythm HR controlled.   Relevant CV Studies: 2D Echo 03/20/18  Study Conclusions  - Procedure narrative: Transthoracic echocardiography. Image   quality was adequate. The study was technically difficult. - Left ventricle: The cavity size was normal. Wall thickness was   increased in a pattern of mild LVH. Systolic function was normal.   The estimated ejection fraction was in the range of 60% to 65%.   Wall motion was  normal; there were no regional wall motion   abnormalities. - Aortic valve: Mildly calcified leaflets. Mild stenosis. Mean   gradient (S): 10 mm Hg. Peak gradient (S): 17 mm Hg. Valve area   (VTI): 1.65 cm^2. Valve area (Vmax): 1.66 cm^2. Valve area   (Vmean): 1.75 cm^2. - Mitral valve: Calcified annulus. Mildly thickened leaflets .   There was trivial regurgitation. - Left atrium: The atrium was normal in size. - Right atrium: The atrium was at the upper limits of normal in   size. - Tricuspid valve: There was mild regurgitation. - Pulmonary arteries: PA peak pressure: 58 mm Hg (S). - Inferior vena cava: The vessel was normal in size. The   respirophasic diameter changes were in the normal range (= 50%),   consistent with normal central venous pressure.  Impressions:  - Compared to a prior study in 2015, the LVEF is higher at 60-65%.   There is now mild aortic stenosis and moderate pulmonary   hypertension with an RVSP of 58 mmHg. Laboratory Data:  Chemistry Recent Labs  Lab 03/19/18 0845 03/20/18 0504 03/21/18 0525  NA 138 138 140  K 5.1 5.8* 4.7  CL 101 100 100  CO2 25 29 29   GLUCOSE 175* 169* 157*  BUN 11 21 27*  CREATININE 1.13* 1.37* 1.40*  CALCIUM 9.3 9.6 9.1  GFRNONAA 40* 32* 31*  GFRAA 47* 37* 36*  ANIONGAP 12 9 11     Recent Labs  Lab 03/19/18 0845 03/20/18 0504  PROT 6.6 6.3*  ALBUMIN 3.2* 3.5  AST 27 23  ALT 13 13  ALKPHOS 63 57  BILITOT 0.8 0.6   Hematology Recent Labs  Lab 03/19/18 0845 03/20/18 0504 03/21/18 0525  WBC 5.1 12.6* 10.0  RBC 3.58* 3.26* 3.33*  HGB 11.5* 10.6* 11.0*  HCT 36.1 33.3* 34.8*  MCV 100.8* 102.1* 104.5*  MCH 32.1 32.5 33.0  MCHC 31.9 31.8 31.6  RDW 12.2 12.3 12.4  PLT 189 205 211   Cardiac Enzymes Recent Labs  Lab 03/18/18 1829  TROPONINI <0.03   No results for input(s): TROPIPOC in the last 168 hours.  BNP Recent Labs  Lab 03/18/18 1829  BNP 141.3*    DDimer No results for input(s): DDIMER in the  last 168 hours.  Radiology/Studies:  Dg Chest 2 View  Result Date: 03/18/2018 CLINICAL DATA:  Cough EXAM: CHEST - 2 VIEW COMPARISON:  February 25, 2018 FINDINGS: There is a small left pleural effusion with mild left base atelectasis. There is no frank edema or consolidation. Heart is upper normal in size with pulmonary vascularity normal. Pacemaker leads are attached to the right atrium right ventricle. There is aortic atherosclerosis. No adenopathy. No bone lesions. IMPRESSION: Small left pleural effusion with left base atelectasis. No edema or consolidation. Stable cardiac silhouette.  Pacemaker leads attached to right atrium and right ventricle. There is aortic atherosclerosis. Aortic Atherosclerosis (ICD10-I70.0). Electronically Signed   By: Lowella Grip III M.D.   On: 03/18/2018 20:01   Ct Angio Chest Pe W/cm &/or Wo Cm  Result Date: 03/18/2018 CLINICAL DATA:  Shortness of breath. EXAM: CT ANGIOGRAPHY CHEST WITH CONTRAST TECHNIQUE: Multidetector CT imaging of the chest was performed using the standard protocol during bolus administration of intravenous contrast. Multiplanar CT image reconstructions and MIPs were obtained to evaluate the vascular anatomy. CONTRAST:  50mL ISOVUE-370 IOPAMIDOL (ISOVUE-370) INJECTION 76% COMPARISON:  Chest radiographs obtained earlier today. Chest CT dated 01/19/2016. FINDINGS: Cardiovascular: Normally opacified pulmonary arteries with no pulmonary arterial filling defects seen. Atheromatous calcifications, including the coronary arteries and aorta. Enlarged heart due to left atrial and left ventricular enlargement. Mediastinum/Nodes: No enlarged mediastinal, hilar, or axillary lymph nodes. Thyroid gland, trachea, and esophagus demonstrate no significant findings. Small hiatal hernia. Lungs/Pleura: Minimal bilateral dependent atelectasis. Interval minimal patchy prominence of the interstitial markings. Small calcified granulomata in both lower lobes. Prominent  epicardial and extrapleural fat on the left, simulating the small left pleural effusion suspected on the radiographs earlier today. No pleural fluid is seen. Upper Abdomen: Cholecystectomy clips. Atheromatous arterial calcifications. Partially included right renal cyst. Musculoskeletal: Thoracic spine degenerative changes and mild scoliosis. Review of the MIP images confirms the above findings. IMPRESSION: 1. No pulmonary emboli. 2. Minimal interstitial pulmonary edema. 3.  Calcific coronary artery and aortic atherosclerosis. Aortic Atherosclerosis (ICD10-I70.0). Electronically Signed   By: Claudie Revering M.D.   On: 03/18/2018 22:10    Assessment and Plan:   Laura Zuniga is a 84 y.o. female with a hx of presumed CAD (anterior wall abnormality on nuclear imaging in 3267), diastolic CHF, atrial fibrillation not on a/c due to bleed risk, ILD, HTN, HLD, symptomatic bradycardia s/p PPM, stage III CKD and iron deficiency anemia who is being seen today for the evaluation of dyspnea and afib medication managment at the request of Dr. Erlinda Hong, Internal Medicine.   1. Dyspnea: suspect etiology for progressive dyspnea is largely pulmonary (ILD + moderate pulmonary HTN) + chronic diastolic CHF. Her lung exam is notable for diffuse inspiratory and expiratory wheezing bilaterally. From a volume standpoint, she appears euvolemic after change in PO diuretic dosing. Given her ILD, progressive dyspnea over the last 6 weeks and current lung exam, I would favor discontinuation of oral amiodarone. MD to assess and will advise.   2. Acute on Chronic Diastolic CHF: echo show normal LVEF. Pt with mildly elevated BNP on admit at 141.3. Also reported a 3 lb weight gain at time of admit. She was treated with PO lasix 40 mg BID. No evidence of significant volume overload on my exam. She looks fairly euvolemic. She has had a bump however in her SCr from 1.13 to 1.40. Recommend holding lasix today. Monitor Scr. Resume PO lasix once renal  function stabilizes. Low sodium diet. Keep BP controlled.   3. H/o Atrial Fibrillation: paced rhythm on tele. HR controlled. Would favor discontinuation of amiodarone as outlined above. Per. Dr. Irish Lack, pt is not a candidate for anticoagulation.   4. PPM: implanted in 2017 for symptomatic bradycardia. Followed by Dr. Lovena Le.   5. ILD: stop amiodarone. Further management per pulmonology.   6. Aortic Stenosis: mild AS by echo.   7. Pulmonary HTN: moderate by recent echo. In the setting of ILD.    For questions or updates, please contact [place] Please consult [email or web address] for contact  info under     Signed, Lyda Jester, PA-C  03/21/2018 7:45 AM As above, patient seen and examined.  Briefly she is a 84 year old female with past medical history of presumed coronary disease, diastolic congestive heart failure, atrial fibrillation not on anticoagulation due to history of falls, interstitial lung disease, hypertension, hyperlipidemia, prior pacemaker, chronic stage III kidney disease admitted with dyspnea for evaluation of atrial fibrillation and amiodarone use.  Patient has chronic dyspnea on exertion and is on home oxygen.  She complains of worsening dyspnea which has improved since admission.  She has been treated with antibiotics, steroids and bronchodilators.  Also gentle diuresis.  She was noted to be on amiodarone and cardiology asked to evaluate.  In reviewing Dr. Agustina Caroli previous notes there was a question of amiodarone toxicity.  BUN 27, creatinine 1.40.  BNP 141.  Electrocardiogram shows atrial paced rhythm.  Echocardiogram shows normal LV function and mild aortic stenosis.  1 paroxysmal atrial fibrillation-patient is in atrial paced rhythm on telemetry.  She has been felt not to be an anticoagulation candidate because of history of falls. CHADSvasc 5.  Based on previous pulmonary notes amiodarone potentially contributed to her interstitial lung disease and she continues to  have increasing dyspnea on exertion.  We will discontinue.  I have explained to her and her daughter that she may have frequent episodes of atrial fibrillation off of amiodarone with high risk of CVA.  They understand.  They will discuss this further with Dr. Lovena Le and Dr. Irish Lack at next office visit.  2 chronic diastolic congestive heart failure-patient is not volume overloaded on examination.  Creatinine mildly increased.  Would hold Lasix today.  Resume home dose at discharge.  3 interstitial lung disease-management per primary care.  [facility] will sign off.   Medication Recommendations: Continue preadmission cardiac medications other than discontinue amiodarone. Other recommendations (labs, testing, etc): No other recommendations from a cardiac standpoint. Follow up as an outpatient: Dr. Lovena Le as scheduled  Kirk Ruths, MD

## 2018-03-21 NOTE — Progress Notes (Signed)
PROGRESS NOTE  Laura Zuniga FBP:[id number] DOB: [birth year] DOA: 03/18/2018 PCP: Wenda Low, MD  Brief Summary:  Per ED intake "Pt BIB EMS from home.Pt has been diagnosed with bronchitis and has been on medications. According to family the patient got choked up today and became [facility]. EMS heard expiratory wheezes and gave her albuterol and Atrovent treatment.     HPI/Recap of past 24 hours:  She had a better night, no agitation, she is alert awake and interactive this morning Still have intermittent cough but less , no fever denies chest pain   Appears euvolemic today, creatinine increased  RN report no choking episode   Patient did not improved, she cough and choked, she became confused, daughter brought her to the hospital  Daughter reports patient base weight is around 149, her weight is 151 on presentation, on exam there is no edema  Daughter request pacemaker being interrogated while she is in the hospital  Assessment/Plan: Principal Problem:   COPD exacerbation ([place]) Active Problems:   HLD (hyperlipidemia)   Depression   Essential hypertension   CAD (coronary artery disease)   PAF (paroxysmal atrial fibrillation) (HCC)   Chronic diastolic heart failure (HCC)   GERD (gastroesophageal reflux disease)   CKD (chronic kidney disease), stage III (HCC)   Cardiac pacemaker   ILD (interstitial lung disease) (HCC)   Hypothyroidism   Hypokalemia   Lactic acidosis   COPD with acute exacerbation (HCC)   Acute on chronic diastolic heart failure/acute on chronic hypoxic respiratory failure -2D echo on 09/28/2013 showed EF of 55-60%.   -BNP 141.3.  troponin negative -Chest x-ray has no pulmonary edema, CT angiogram  No PE but showed mild interstitial edema.   -Patient has trace leg edema but no JVD. Per admitting MD and EDP, patient has diffuse wheezing initially, today I did not hear wheezing  -weight slightly up from base weight per family -repeat echocardiogram  again showed diastolic defunct dysfunction and pulmonary hypertension -She received IV Lasix on admission, her day her volume appeared has improved, now close to euvolemic -Cardiology consulted, input appreciated -Wean oxygen  ILD (interstitial lung disease)/Pulmonary HTN/baseline on home oxygen nightly and as needed with activity -I have reviewed Dr Lamonte Sakai note, per pulmonology Dr Lamonte Sakai note from 2017, patient does not appear to have copd, but does has ILD , per DR Byrum Etiology could be secondary to initial higher dosing of amiodarone-now on low-dose amiodarone -Echo 2015: PA pressure 52 mmHg --flu swab negative -Cardiology recommend stop amiodarone -Continue Solu-Medrol today, if continued improvement will taper steroid -Procalcitonin less than 0.1, DC'd Zithromax -Daughter reports patient was treated for bronchitis with abx and steroids x3 in the past 8 weeks.  Each time she will get better for about a week and then symptom returns -Need to follow-up with Dr. Lamonte Sakai for interstitial lung disease -Wean oxygen to baseline  there is also concern for dysphagia for which she has MBS in 02/2016 at the time reports was mild aspiration risk Daughter also reports patient has h/o hiatal hernia Daughter reports patient has intermittent trouble with swallowing, she choked on her own mucus prior to coming to the ED Consider dg esophagus once patient is more awake, today no choking episode observed  Hypokalemia: k 2.9 on presentation, replaced  Lactic acidosis: Lactic acid 2.04, 2.39.  No fever or leukocytosis.  Does not seem to have sepsis From tissue hypoxia? Lactic acid normalized, today is 1.5   Acute metabolic encephalopathy: Family reports baseline mild dementia, but  patient appear more confused the morning prior to coming to the ED Per family patient was agitated last night, not able to sleep, she received ambien at 0045am, has been very drowsy today, family request to d/c  ambien -Resolved, family report baseline dementia, at baseline she is not oriented to time    CAD: s/p of stent. No CP -Continue Plavix, pravastatin, Imdur  H/o Afib/bradycardia s/p pacemaker placement Per cardiology Dr Maricela Curet "Not thought to be a good long-term anticoagulation candidate" She is on plavix Cardiology consulted , amiodarone discontinued due to concern of progressive interstitial lung disease Daughter also request pacemaker to be interrogated, cardiology recommend outpatient pacemaker interrogation  FTT: Start physical therapy,   Code Status: full  Family Communication: patient and daughter at bedside  Disposition Plan: Home in 1 to 2 days   Consultants:  cardiology  Procedures:  none  Antibiotics:  zithromax from admission to December 13   Objective: BP (!) 135/41 (BP Location: Right Arm)   Pulse 66   Temp 97.7 F (36.5 C) (Oral)   Resp 16   Ht 5' (1.524 m)   Wt 68.5 kg   SpO2 96%   BMI 29.51 kg/m   Intake/Output Summary (Last 24 hours) at 03/21/2018 1934 Last data filed at 03/21/2018 1800 Gross per 24 hour  Intake 660 ml  Output -  Net 660 ml   Filed Weights   03/18/18 1807 03/19/18 1845  Weight: 67.6 kg 68.5 kg    Exam: Patient is examined daily including today on 03/21/2018, exams remain the same as of yesterday except that has changed    General: Alert and interactive, very pleasant, she is oriented to place and person, not to time, family report this is her baseline  Cardiovascular: paced rhythm  Respiratory: mild bibasilar crackles, no wheezing  Abdomen: Soft/ND/NT, positive BS  Musculoskeletal: No Edema, does has chronic venous stasis skin changes  Neuro: Alert and interactive  Data Reviewed: Basic Metabolic Panel: Recent Labs  Lab 03/18/18 1829 03/19/18 0551 03/19/18 0845 03/20/18 0504 03/21/18 0525  NA 140  --  138 138 140  K 2.9*  --  5.1 5.8* 4.7  CL 98  --  101 100 100  CO2 28  --  25 29 29   GLUCOSE  107*  --  175* 169* 157*  BUN 10  --  11 21 27*  CREATININE 1.01*  --  1.13* 1.37* 1.40*  CALCIUM 9.0  --  9.3 9.6 9.1  MG  --  2.2  --  2.6*  --    Liver Function Tests: Recent Labs  Lab 03/19/18 0845 03/20/18 0504  AST 27 23  ALT 13 13  ALKPHOS 63 57  BILITOT 0.8 0.6  PROT 6.6 6.3*  ALBUMIN 3.2* 3.5   No results for input(s): LIPASE, AMYLASE in the last 168 hours. No results for input(s): AMMONIA in the last 168 hours. CBC: Recent Labs  Lab 03/18/18 1829 03/19/18 0845 03/20/18 0504 03/21/18 0525  WBC 8.1 5.1 12.6* 10.0  NEUTROABS 5.5 4.5 11.7* 9.4*  HGB 11.3* 11.5* 10.6* 11.0*  HCT 35.1* 36.1 33.3* 34.8*  MCV 102.6* 100.8* 102.1* 104.5*  PLT 181 189 205 211   Cardiac Enzymes:   Recent Labs  Lab 03/18/18 1829  TROPONINI <0.03   BNP (last 3 results) Recent Labs    03/18/18 1829  BNP 141.3*    ProBNP (last 3 results) No results for input(s): PROBNP in the last 8760 hours.  CBG: Recent Labs  Lab  03/20/18 1310  GLUCAP 149*    No results found for this or any previous visit (from the past 240 hour(s)).   Studies: No results found.  Scheduled Meds: . azithromycin  250 mg Oral Daily  . clopidogrel  75 mg Oral Q breakfast  . dextromethorphan-guaiFENesin  1 tablet Oral BID  . escitalopram  15 mg Oral Daily  . famotidine  20 mg Oral Daily  . ferrous sulfate  325 mg Oral QPC lunch  . heparin injection (subcutaneous)  5,000 Units Subcutaneous Q8H  . hydrALAZINE  25 mg Oral Q8H  . ipratropium-albuterol  3 mL Nebulization QID  . isosorbide mononitrate  60 mg Oral Daily  . levothyroxine  50 mcg Oral QAC breakfast  . methylPREDNISolone (SOLU-MEDROL) injection  60 mg Intravenous Q8H  . mometasone-formoterol  2 puff Inhalation BID  . pantoprazole  40 mg Oral QPC lunch  . pravastatin  40 mg Oral Daily  . vitamin E  400 Units Oral QPC lunch    Continuous Infusions:   Time spent: 60mins, case discussed with cardiology I have personally reviewed and  interpreted on  03/21/2018 daily labs, imagings as discussed above under date review session and assessment and plans.  I reviewed all nursing notes, pharmacy notes, consultant notes,  vitals, pertinent old records  I have discussed plan of care as described above with RN , patient and family on 03/21/2018   Florencia Reasons MD, PhD  [facility] Pager [phone]. If 7PM-7AM, please contact night-coverage at [email or web address], password [facility] 03/21/2018, 7:34 PM  LOS: 1 day

## 2018-03-21 NOTE — Progress Notes (Signed)
CRITICAL VALUE STICKER  CRITICAL VALUE: Lactic acid 2.7  RECEIVER (on-site recipient of call): Myriam Jacobson, [place] NOTIFIED: 03/21/2018  0616  MESSENGER (representative from lab): Claiborne Billings  MD NOTIFIED: [place]  TIME OF NOTIFICATION: 0618  RESPONSE: 500 ml bolus ordered  Will continue to monitor the pt closely.

## 2018-03-22 LAB — BASIC METABOLIC PANEL
Anion gap: 9 (ref 5–15)
BUN: 26 mg/dL — ABNORMAL HIGH (ref 8–23)
CO2: 28 mmol/L (ref 22–32)
Calcium: 8.9 mg/dL (ref 8.9–10.3)
Chloride: 100 mmol/L (ref 98–111)
Creatinine, Ser: 1.27 mg/dL — ABNORMAL HIGH (ref 0.44–1.00)
GFR calc Af Amer: 41 mL/min — ABNORMAL LOW (ref >60)
GFR calc non Af Amer: 35 mL/min — ABNORMAL LOW (ref >60)
Glucose, Bld: 151 mg/dL — ABNORMAL HIGH (ref 70–99)
POTASSIUM: 4.3 mmol/L (ref 3.5–5.1)
Sodium: 137 mmol/L (ref 135–145)

## 2018-03-22 LAB — PROCALCITONIN: Procalcitonin: <0.1 ng/mL

## 2018-03-22 MED ORDER — FERROUS SULFATE 325 (65 FE) MG PO TABS: 325.0000 mg | ORAL_TABLET | ORAL | 0 refills | Status: DC

## 2018-03-22 MED ORDER — GUAIFENESIN ER 600 MG PO TB12: 600.0000 mg | ORAL_TABLET | Freq: Two times a day (BID) | ORAL | 2 refills | Status: DC

## 2018-03-22 MED ORDER — PREDNISONE 10 MG PO TABS: ORAL_TABLET | ORAL | 0 refills | Status: DC

## 2018-03-22 MED ORDER — SENNOSIDES-DOCUSATE SODIUM 8.6-50 MG PO TABS
1.0000 | ORAL_TABLET | Freq: Two times a day (BID) | ORAL | Status: DC
  Administered 2018-03-22: 1 via ORAL
  Filled 2018-03-22: qty 1

## 2018-03-22 MED ORDER — POLYETHYLENE GLYCOL 3350 17 G PO PACK
17.0000 g | PACK | Freq: Every day | ORAL | Status: DC
  Administered 2018-03-22: 17 g via ORAL

## 2018-03-22 MED ORDER — HYDRALAZINE HCL 25 MG PO TABS: 25.0000 mg | ORAL_TABLET | Freq: Three times a day (TID) | ORAL | 0 refills | Status: AC

## 2018-03-22 MED ORDER — SENNOSIDES-DOCUSATE SODIUM 8.6-50 MG PO TABS: 1.0000 | ORAL_TABLET | Freq: Every day | ORAL | 0 refills | Status: AC

## 2018-03-22 NOTE — Care Management Note (Signed)
Case Management Note  Patient Details  Name: Laura Zuniga MRN: [id number] Date of Birth: [birth year]  Subjective/Objective:  CHF                  Action/Plan:  Spoke to pt and dtr at bedside. Offered choice for HH/CMS list provided/placed on chart. Has oxygen and RW at home. Dtr agreeable to [facility] for [facility]. Contacted rep with new referral. Dtr will be at home to assist with care.    Expected Discharge Date:  03/22/18               Expected Discharge Plan:  [place]  In-House Referral:  NA  Discharge planning Services  CM Consult  Post Acute Care Choice:  Home Health Choice offered to:  Adult Children  DME Arranged:  N/A DME Agency:  NA  HH Arranged:  PT, RN, OT HH Agency:  [facility]  Status of Service:  Completed, signed off  If discussed at [place] of Stay Meetings, dates discussed:    Additional Comments:  Erenest Rasher, RN 03/22/2018, 2:44 PM

## 2018-03-22 NOTE — Progress Notes (Signed)
Physical Therapy Treatment Patient Details Name: Laura Zuniga MRN: [id number] DOB: March 26, 1918 Today's Date: 03/22/2018    History of Present Illness 84 year old female with history of hypertension, hyperlipidemia, ILD, COPD, GERD, hyperthyroidism, depression, bradycardia, C. difficile colitis, CAD with stent placement, diastolic CHF, CKD stage III, PAF not on anticoagulation, iron deficiency anemia, pacemaker placement due to sinus node dysfunction presented with shortness of breath gradually getting worse for the last 6 weeks despite being treated with antibiotics and prednisone for 3 times per PCP    PT Comments    Pt was seen by PT to assess gait after a three day delay in being OOB.  Nursing called to ask PT to work with pt and noted her ability to walk is quite similar to the evaluation from when pt was in ED.  Her plan is to progress with HHPT and will need family assist there as she is somewhat off balance and requires small corrections in standing and walking.  Pt is aware she needs help and presents less a fall risk as a result.  Follow acutely as needed until dc.  Follow Up Recommendations  Home health PT;Supervision for mobility/OOB     Equipment Recommendations  None recommended by PT    Recommendations for Other Services       Precautions / Restrictions Precautions Precautions: Fall Restrictions Weight Bearing Restrictions: No    Mobility  Bed Mobility Overal bed mobility: Needs Assistance Bed Mobility: Supine to Sit;Sit to Supine     Supine to sit: Min assist Sit to supine: Min assist   General bed mobility comments: min assist for legs into bed and trunk to get out  Transfers Overall transfer level: Needs assistance Equipment used: Rolling walker (2 wheeled) Transfers: Sit to/from Stand Sit to Stand: Mod assist         General transfer comment: reminders for hand placemetn  Ambulation/Gait Ambulation/Gait assistance: Min guard;Min assist Gait  Distance (Feet): 80 Feet Assistive device: Rolling walker (2 wheeled) Gait Pattern/deviations: Step-through pattern;Decreased stride length;Wide base of support;Trunk flexed Gait velocity: reduced Gait velocity interpretation: <1.31 ft/sec, indicative of household ambulator General Gait Details: tends to walk out of walker and is cued with min assist to relocate walker, min guard for steady state walking   Stairs             Wheelchair Mobility    Modified Rankin (Stroke Patients Only)       Balance Overall balance assessment: Needs assistance Sitting-balance support: Feet supported Sitting balance-Leahy Scale: Good     Standing balance support: Bilateral upper extremity supported;During functional activity Standing balance-Leahy Scale: Poor                              Cognition Arousal/Alertness: Awake/alert Behavior During Therapy: WFL for tasks assessed/performed Overall Cognitive Status: Within Functional Limits for tasks assessed                                        Exercises      General Comments General comments (skin integrity, edema, etc.): pt asked to go to BR and has cloudy urine      Pertinent Vitals/Pain Pain Assessment: No/denies pain    Home Living                      Prior Function  PT Goals (current goals can now be found in the care plan section) Acute Rehab PT Goals Patient Stated Goal: return home and walk Progress towards PT goals: Progressing toward goals    Frequency    Min 3X/week      PT Plan Current plan remains appropriate    Co-evaluation              AM-PAC PT "6 Clicks" Mobility   Outcome Measure  Help needed turning from your back to your side while in a flat bed without using bedrails?: A Little Help needed moving from lying on your back to sitting on the side of a flat bed without using bedrails?: A Little Help needed moving to and from a bed to a  chair (including a wheelchair)?: A Little Help needed standing up from a chair using your arms (e.g., wheelchair or bedside chair)?: A Little Help needed to walk in hospital room?: A Little Help needed climbing 3-5 steps with a railing? : A Little 6 Click Score: 18    End of Session Equipment Utilized During Treatment: Gait belt Activity Tolerance: Patient limited by fatigue Patient left: in bed;with call bell/phone within reach;with bed alarm set;with family/visitor present Nurse Communication: Mobility status PT Visit Diagnosis: Difficulty in walking, not elsewhere classified (R26.2)     Time: 2641-5830 PT Time Calculation (min) (ACUTE ONLY): 25 min  Charges:  $Gait Training: 8-22 mins $Therapeutic Activity: 8-22 mins                     Ramond Dial 03/22/2018, 2:06 PM   Mee Hives, PT MS Acute Rehab Dept. Number: [place]

## 2018-03-22 NOTE — Discharge Summary (Addendum)
Discharge Summary  Laura Zuniga [facility] CHE:[id number] DOB: 24-Dec-1917  PCP: Laura Low, MD  Admit date: 03/18/2018 Discharge date: 03/22/2018  Time spent: 53mins, more than 50% time spent on coordination of care.  Recommendations for Outpatient Follow-up:  1. F/u with PCP within a week  for hospital discharge follow up, repeat cbc/bmp at follow up 2. F/u with pulmonology Laura Laura Zuniga for interstitial lung disease management 3. Follow-up with cardiology Laura. Irish Zuniga and EP Laura. Lovena Zuniga for chronic diastolic heart failure and A. fib  4. Home health RN for heart failure management, home health PT/ OT ordered.  Discharge Diagnoses:  Active Hospital Problems   Diagnosis Date Noted  . COPD exacerbation ([place]) 03/18/2018  . COPD with acute exacerbation ([place]) 03/20/2018  . Hypokalemia 03/18/2018  . Lactic acidosis 03/18/2018  . Hypothyroidism 07/03/2016  . ILD (interstitial lung disease) ([place]) 02/17/2016  . Cardiac pacemaker 02/16/2016  . CKD (chronic kidney disease), stage III ([place]) 09/14/2014  . GERD (gastroesophageal reflux disease) 10/29/2013  . Chronic diastolic heart failure ([place]) 09/28/2013  . PAF (paroxysmal atrial fibrillation) ([place]) 09/27/2013  . CAD (coronary artery disease) 07/15/2006  . Depression 07/15/2006  . Essential hypertension 07/15/2006  . HLD (hyperlipidemia) 07/15/2006    Resolved Hospital Problems  No resolved problems to display.    Discharge Condition: stable  Diet recommendation: heart healthy  Filed Weights   03/18/18 1807 03/19/18 1845  Weight: 67.6 kg 68.5 kg    History of present illness: (per admitting MD Laura Laura Zuniga) PCP: Laura Low, MD   Patient coming from:  The patient is coming from home.  At baseline, pt is dependent for most of ADL.        Chief Complaint: SOB  HPI: Laura Zuniga is a 84 y.o. female with medical history significant of hypertension, hyperlipidemia, ILD, COPD, GERD, hypothyroidism, depression, bradycardia, C.  difficile colitis, CAD, stent placement, dCHF, CKD 3, PAF not on anticoagulants, iron deficiency anemia, pacemaker placement due to sinus node dysfunction, who presents with shortness of breath.  Per patient's daughter, patient has been having shortness of breath for more than 6 weeks, which has worsened in the past 2 weeks.  She has wheezing and cough, mostly dry cough, no chest pain, fever or chills.  Patient was treated with antibiotics and prednisone for 3 times by PCP, but problem comes back again after she had chocking episode per her daughter.  Patient does not have nausea, vomiting, diarrhea, abdominal pain, symptoms of UTI.  Patient had mild confusion earlier, which improved.  Currently patient is oriented x3.  Patient moves all extremities normally.  No unilateral weakness in extremities, no facial droop or slurred speech.  Denies symptoms of UTI.  ED Course: pt was found to have WBC 8.1, lactic acid 2.04, 2.39, negative troponin, BNP 141.3, pending urinalysis, potassium 2.9, stable renal function, temperature normal, no tachycardia, has tachypnea, oxygen saturation 99% on room air.  Chest x-ray showed left small amount of a pleural effusion in the left basilar atelectasis.  CT angiogram chest is negative for PE, but showed mild interstitial edema.  Patient is placed on telemetry bed for observation.  Hospital Course:  Principal Problem:   COPD exacerbation ([place]) Active Problems:   HLD (hyperlipidemia)   Depression   Essential hypertension   CAD (coronary artery disease)   PAF (paroxysmal atrial fibrillation) ([facility])   Chronic diastolic heart failure (H[facility])   GERD (gastroesophageal reflux disease)   CKD (chronic kidney disease), stage III [facility])   Cardiac pacemaker  ILD (interstitial lung disease) (HCC)   Hypothyroidism   Hypokalemia   Lactic acidosis   COPD with acute exacerbation (H[facility])   Acute on chronic diastolic heart failure/acute on chronic hypoxic respiratory failure -2D  echo on 09/28/2013 showed EF of 55-60%.  -BNP 141.3. troponin negative -Chest x-ray has no pulmonary edema, CT angiogram  No PE but showed mild interstitial edema.  -weight slightly up on presentation from base weight per family -Patient has trace leg edema on admission but no JVD. Per admitting MD and EDP, patient has diffuse wheezing initially, today I did not hear significant wheezing  --repeat echocardiogram again showed diastolic defunct dysfunction and pulmonary hypertension -She received IV Lasix on admission, her day her volume appeared has improved,  in fact Lasix held on December 13 due to little bit elevated creatinine at 1.4, today Coreg at 10 and come down to 1.27 on 12/14. now close to euvolemic, resume home dose lasix -Cardiology consulted, input appreciated, cardiology has signed off recommend outpatient follow-up -oxygen weaned to baseline  AKI on CKDIII Cardiorenal syndrome vs from diuresis -cr peaked at 1.4 trend down, ( cr baseline 1)  ILD (interstitial lung disease)/Pulmonary HTN/baseline on home oxygen nightly and as needed with activity -I have reviewed Laura Laura Zuniga note, per pulmonology Laura Laura Zuniga note from 2017, patient does not appear to have copd, but does has ILD , per Laura Zuniga Etiology could be secondary to initial higher dosing of amiodarone-now on Zuniga-dose amiodarone -Daughter reports patient was treated for bronchitis with abx and steroids x3 in the past 8 weeks.  Each time she will get better for about a week and then symptom returns --flu swab negative, Procalcitonin less than 0.1, DC'd abx --Cardiology recommend stop amiodarone -she received Solu-Medrol in the hospital,  she has improved , no cough today on exam .  She is discharged on a longer course of steroid taper , follow-up with pulmonologist Laura Zuniga in 2 weeks   there is also concern for dysphagia for which she has MBS in 02/2016 at the time reports was mild aspiration risk Daughter also reports  patient has h/o hiatal hernia Daughter reports patient has intermittent trouble with swallowing, she choked on her own mucus prior to coming to the ED dg esophagus was considered initially but, no choking episode or swallow issues observed since in the hospital  Hypokalemia: k 2.9 on presentation, replaced  Lactic acidosis: -Lactic acid  Peaked at 5.2, normalized.  -No fever or leukocytosis. Does not seem tohave sepsis -Lactic acidosis likely from tissue hypoxia   Acute metabolic encephalopathy: Family reports baseline mild dementia, but patient appear more confused the morning prior to coming to the ED Per family patient was agitated last night, not able to sleep, she received ambien at 0045am, has been very drowsy today, family request to d/c ambien -Resolved, family report baseline dementia, at baseline she is not oriented to time, but to person and place.    CAD: s/p of stent. No CP -Continue Plavix, pravastatin,Imdur  H/o Afib/bradycardia s/p pacemaker placement Per cardiology Laura Maricela Curet "Not thought to be a good long-term anticoagulation candidate" She is on plavix Cardiology consulted , amiodarone discontinued due to concern of progressive interstitial lung disease Daughter also request pacemaker to be interrogated, cardiology recommend outpatient pacemaker interrogation  FTT: physical therapy recommended home health  Code Status: full  Family Communication: patient and daughter at bedside  Disposition Plan: Home with home health   Consultants:  cardiology  Procedures:  none  Antibiotics:  zithromax from admission to December 13   Discharge Exam: BP (!) 143/52 (BP Location: Right Arm)   Pulse 62   Temp (!) 97.5 F (36.4 C) (Oral)   Resp 16   Ht 5' (1.524 m)   Wt 68.5 kg   SpO2 97%   BMI 29.51 kg/m    General: Alert and interactive, very pleasant, she is oriented to place and person, not to time, family report this is her  baseline  Cardiovascular: paced rhythm  Respiratory: mild bibasilar crackles, no wheezing  Abdomen: Soft/ND/NT, positive BS  Musculoskeletal: No Edema, does has chronic venous stasis skin changes  Neuro: Alert and interactive   Discharge Instructions You were cared for by a hospitalist during your hospital stay. If you have any questions about your discharge medications or the care you received while you were in the hospital after you are discharged, you can call the unit and asked to speak with the hospitalist on call if the hospitalist that took care of you is not available. Once you are discharged, your primary care physician will handle any further medical issues. Please note that NO REFILLS for any discharge medications will be authorized once you are discharged, as it is imperative that you return to your primary care physician (or establish a relationship with a primary care physician if you do not have one) for your aftercare needs so that they can reassess your need for medications and monitor your lab values.  Discharge Instructions    Diet - Zuniga sodium heart healthy   Complete by:  As directed    Increase activity slowly   Complete by:  As directed      Allergies as of 03/22/2018      Reactions   Penicillins Other (See Comments)   Unknown allergic reaction Has patient had a PCN reaction causing immediate rash, facial/tongue/throat swelling, SOB or lightheadedness with hypotension: unknown Has patient had a PCN reaction causing severe rash involving mucus membranes or skin necrosis: unknown Has patient had a PCN reaction that required hospitalization: unknown Has patient had a PCN reaction occurring within the last 10 years: unknown If all of the above answers are "NO", then may proceed with Cephalosporin use.   2,4-d Dimethylamine (amisol) Other (See Comments)   Ace Inhibitors Other (See Comments)   Lanolin Hives   Sulfa Antibiotics    Vancomycin    Red man syndrome    Sulfonamide Derivatives Swelling, Rash      Medication List    STOP taking these medications   amiodarone 100 MG tablet Commonly known as:  PACERONE   famotidine 20 MG tablet Commonly known as:  PEPCID   predniSONE 10 MG (21) Tbpk tablet Commonly known as:  STERAPRED UNI-PAK 21 TAB Replaced by:  predniSONE 10 MG tablet     TAKE these medications   acetaminophen 500 MG tablet Commonly known as:  TYLENOL Take 2 tablets (1,000 mg total) by mouth every 6 (six) hours as needed for mild pain or moderate pain.   albuterol 108 (90 Base) MCG/ACT inhaler Commonly known as:  PROVENTIL HFA;VENTOLIN HFA Inhale 2 puffs into the lungs every 6 (six) hours as needed for wheezing or shortness of breath.   benzonatate 100 MG capsule Commonly known as:  TESSALON Take 1 capsule (100 mg total) by mouth every 8 (eight) hours.   Biotin 1 MG Caps Take 1 mg by mouth daily.   clopidogrel 75 MG tablet Commonly known as:  PLAVIX TAKE 1 TABLET BY MOUTH  ONCE DAILY. Please make overdue appt with Laura. Irish Zuniga before anymore refills. 1st attempt   Cranberry 250 MG Caps Take 250 mg by mouth daily after lunch.   docusate sodium 100 MG capsule Commonly known as:  COLACE Take 100 mg by mouth daily as needed for mild constipation.   EQ CLEARLAX powder Generic drug:  polyethylene glycol powder Take 17 g by mouth daily as needed for constipation.   escitalopram 10 MG tablet Commonly known as:  LEXAPRO Take 15 mg daily by mouth.   EYE DROPS OP Place 1 drop into both eyes daily as needed (dry eyes).   ferrous sulfate 325 (65 FE) MG tablet Take 1 tablet (325 mg total) by mouth every Monday, Wednesday, and Friday. Start taking on:  March 24, 2018 What changed:  when to take this   furosemide 80 MG tablet Commonly known as:  LASIX Take 1 tablet by mouth in the morning, 1/2 tablet (40 mg)  in the evening and can take 1/2 tablet daily as needed for swelling   guaiFENesin 600 MG 12 hr  tablet Commonly known as:  MUCINEX Take 1 tablet (600 mg total) by mouth 2 (two) times daily.   hydrALAZINE 25 MG tablet Commonly known as:  APRESOLINE Take 1 tablet (25 mg total) by mouth 3 (three) times daily. What changed:  when to take this   ICAPS AREDS 2 PO Take 1 tablet by mouth daily.   isosorbide mononitrate 60 MG 24 hr tablet Commonly known as:  IMDUR TAKE 1 TABLET BY MOUTH ONCE DAILY   levothyroxine 50 MCG tablet Commonly known as:  SYNTHROID, LEVOTHROID Take 50 mcg by mouth daily before breakfast.   pantoprazole 40 MG tablet Commonly known as:  PROTONIX Take 40 mg by mouth daily after lunch.   potassium chloride SA 20 MEQ tablet Commonly known as:  K-DUR,KLOR-CON Take 20 mEq by mouth 2 (two) times daily.   pravastatin 40 MG tablet Commonly known as:  PRAVACHOL Take 40 mg daily by mouth.   predniSONE 10 MG tablet Commonly known as:  DELTASONE Label  & dispense according to the schedule below. 6 Pills PO for three days then, 5 Pills PO for three days, then 4 Pills PO for three days, then 3Pills PO for three days, then 2 Pills PO for three days, then 1 Pill PO for three days,  then STOP.  Total of 63 tabs Replaces:  predniSONE 10 MG (21) Tbpk tablet   senna-docusate 8.6-50 MG tablet Commonly known as:  Senokot-S Take 1 tablet by mouth at bedtime.   vitamin E 400 UNIT capsule Take 400 Units by mouth daily after lunch.      Allergies  Allergen Reactions  . Penicillins Other (See Comments)    Unknown allergic reaction Has patient had a PCN reaction causing immediate rash, facial/tongue/throat swelling, SOB or lightheadedness with hypotension: unknown Has patient had a PCN reaction causing severe rash involving mucus membranes or skin necrosis: unknown Has patient had a PCN reaction that required hospitalization: unknown Has patient had a PCN reaction occurring within the last 10 years: unknown If all of the above answers are "NO", then may proceed with  Cephalosporin use.   . 2,4-D Dimethylamine (Amisol) Other (See Comments)  . Ace Inhibitors Other (See Comments)  . Lanolin Hives  . Sulfa Antibiotics   . Vancomycin     Red man syndrome  . Sulfonamide Derivatives Swelling and Rash   Follow-up Information    Collene Gobble, MD Follow  up in 2 week(s).   Specialty:  Pulmonary Disease Why:  interstitial lung disease Contact information: [address] [place] [address] [phone]        Laura Low, MD Follow up in 1 week(s).   Specialty:  Internal Medicine Why:  hospital discharge follow up, repeat cbc/bmp at follow up.  Contact information: [address], [address] [place] [address]        Jettie Booze, MD .   Specialties:  Cardiology, Radiology, Interventional Cardiology Contact information: [address] [place] [address] [phone]        Evans Lance, MD .   Specialty:  Cardiology Contact information: [phone] [address] [place] [address] [phone]            The results of significant diagnostics from this hospitalization (including imaging, microbiology, ancillary and laboratory) are listed below for reference.    Significant Diagnostic Studies: Dg Chest 2 View  Result Date: 03/18/2018 CLINICAL DATA:  Cough EXAM: CHEST - 2 VIEW COMPARISON:  February 25, 2018 FINDINGS: There is a small left pleural effusion with mild left base atelectasis. There is no frank edema or consolidation. Heart is upper normal in size with pulmonary vascularity normal. Pacemaker leads are attached to the right atrium right ventricle. There is aortic atherosclerosis. No adenopathy. No bone lesions. IMPRESSION: Small left pleural effusion with left base atelectasis. No edema or consolidation. Stable cardiac silhouette. Pacemaker leads attached to right atrium and right ventricle. There is aortic atherosclerosis. Aortic Atherosclerosis  (ICD10-I70.0). Electronically Signed   By: Lowella Grip III M.D.   On: 03/18/2018 20:01   Dg Chest 2 View  Result Date: 02/25/2018 CLINICAL DATA:  84 year old female with history of wheezing and elevated blood pressure. Chest congestion. No fever. EXAM: CHEST - 2 VIEW COMPARISON:  Chest x-ray 07/04/2016. 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 No acute consolidative airspace disease. No pleural effusions. Lung volumes are normal. Mild diffuse interstitial prominence. Mild diffuse peribronchial cuffing. Heart size is upper limits of normal. Pulmonary vasculature is normal. Upper mediastinal contours are within normal limits. Aortic atherosclerosis. Left-sided pacemaker with lead tips projecting over the right atrium and right ventricular apex. IMPRESSION: 1. Diffuse interstitial prominence and peribronchial cuffing, concerning for an acute bronchitis. 2. Aortic atherosclerosis. Electronically Signed   By: [name] M.D.   On: 02/25/2018 16:42   Ct Angio Chest Pe W/cm &/or Wo Cm  Result Date: 03/18/2018 CLINICAL DATA:  Shortness of breath. EXAM: CT ANGIOGRAPHY CHEST WITH CONTRAST TECHNIQUE: Multidetector CT imaging of the chest was performed using the standard protocol during bolus administration of intravenous contrast. Multiplanar CT image reconstructions and MIPs were obtained to evaluate the vascular anatomy. CONTRAST:  18mL ISOVUE-370 IOPAMIDOL (ISOVUE-370) INJECTION 76% COMPARISON:  Chest radiographs obtained earlier today. Chest CT dated 01/19/2016. FINDINGS: Cardiovascular: Normally opacified pulmonary arteries with no pulmonary arterial filling defects seen. Atheromatous calcifications, including the coronary arteries and aorta. Enlarged heart due to left atrial and left ventricular enlargement. Mediastinum/Nodes: No enlarged mediastinal, hilar, or axillary lymph nodes. Thyroid gland, trachea, and esophagus demonstrate no  significant findings. Small hiatal hernia. Lungs/Pleura: Minimal bilateral dependent atelectasis. Interval minimal patchy prominence of the interstitial markings. Small calcified granulomata in both lower lobes. Prominent epicardial and extrapleural fat on the left, simulating the small left pleural effusion suspected on the radiographs earlier today. No pleural fluid is seen. Upper Abdomen: Cholecystectomy  clips. Atheromatous arterial calcifications. Partially included right renal cyst. Musculoskeletal: Thoracic spine degenerative changes and mild scoliosis. Review of the MIP images confirms the above findings. IMPRESSION: 1. No pulmonary emboli. 2. Minimal interstitial pulmonary edema. 3.  Calcific coronary artery and aortic atherosclerosis. Aortic Atherosclerosis (ICD10-I70.0). Electronically Signed   By: Claudie Revering M.D.   On: 03/18/2018 22:10    Microbiology: No results found for this or any previous visit (from the past 240 hour(s)).   Labs: Basic Metabolic Panel: Recent Labs  Lab 03/18/18 1829 03/19/18 0551 03/19/18 0845 03/20/18 0504 03/21/18 0525 03/22/18 0433  NA 140  --  138 138 140 137  K 2.9*  --  5.1 5.8* 4.7 4.3  CL 98  --  101 100 100 100  CO2 28  --  25 29 29 28   GLUCOSE 107*  --  175* 169* 157* 151*  BUN 10  --  11 21 27* 26*  CREATININE 1.01*  --  1.13* 1.37* 1.40* 1.27*  CALCIUM 9.0  --  9.3 9.6 9.1 8.9  MG  --  2.2  --  2.6*  --   --    Liver Function Tests: Recent Labs  Lab 03/19/18 0845 03/20/18 0504  AST 27 23  ALT 13 13  ALKPHOS 63 57  BILITOT 0.8 0.6  PROT 6.6 6.3*  ALBUMIN 3.2* 3.5   No results for input(s): LIPASE, AMYLASE in the last 168 hours. No results for input(s): AMMONIA in the last 168 hours. CBC: Recent Labs  Lab 03/18/18 1829 03/19/18 0845 03/20/18 0504 03/21/18 0525  WBC 8.1 5.1 12.6* 10.0  NEUTROABS 5.5 4.5 11.7* 9.4*  HGB 11.3* 11.5* 10.6* 11.0*  HCT 35.1* 36.1 33.3* 34.8*  MCV 102.6* 100.8* 102.1* 104.5*  PLT 181 189 205  211   Cardiac Enzymes: Recent Labs  Lab 03/18/18 1829  TROPONINI <0.03   BNP: BNP (last 3 results) Recent Labs    03/18/18 1829  BNP 141.3*    ProBNP (last 3 results) No results for input(s): PROBNP in the last 8760 hours.  CBG: Recent Labs  Lab 03/20/18 1310  GLUCAP 149*       Signed:  Florencia Reasons MD, PhD  [facility] 03/22/2018, 11:09 AM

## 2018-03-24 DIAGNOSIS — J441 Chronic obstructive pulmonary disease with (acute) exacerbation: Secondary | ICD-10-CM | POA: Diagnosis not present

## 2018-03-24 DIAGNOSIS — I5033 Acute on chronic diastolic (congestive) heart failure: Secondary | ICD-10-CM | POA: Diagnosis not present

## 2018-03-24 DIAGNOSIS — I480 Paroxysmal atrial fibrillation: Secondary | ICD-10-CM | POA: Diagnosis not present

## 2018-03-24 DIAGNOSIS — N183 Chronic kidney disease, stage 3 (moderate): Secondary | ICD-10-CM | POA: Diagnosis not present

## 2018-03-24 DIAGNOSIS — I130 Hypertensive heart and chronic kidney disease with heart failure and stage 1 through stage 4 chronic kidney disease, or unspecified chronic kidney disease: Secondary | ICD-10-CM | POA: Diagnosis not present

## 2018-03-25 ENCOUNTER — Other Ambulatory Visit: Payer: Self-pay | Admitting: Interventional Cardiology

## 2018-03-26 ENCOUNTER — Encounter

## 2018-03-26 ENCOUNTER — Encounter: Payer: Self-pay | Admitting: Interventional Cardiology

## 2018-03-26 ENCOUNTER — Ambulatory Visit: Payer: PPO | Admitting: Interventional Cardiology

## 2018-03-26 VITALS — BP 152/62 | HR 63 | Ht 60.0 in | Wt 150.0 lb

## 2018-03-26 DIAGNOSIS — R9430 Abnormal result of cardiovascular function study, unspecified: Secondary | ICD-10-CM

## 2018-03-26 DIAGNOSIS — Z950 Presence of cardiac pacemaker: Secondary | ICD-10-CM

## 2018-03-26 DIAGNOSIS — I5032 Chronic diastolic (congestive) heart failure: Secondary | ICD-10-CM | POA: Diagnosis not present

## 2018-03-26 DIAGNOSIS — I25118 Atherosclerotic heart disease of native coronary artery with other forms of angina pectoris: Secondary | ICD-10-CM

## 2018-03-26 DIAGNOSIS — I480 Paroxysmal atrial fibrillation: Secondary | ICD-10-CM

## 2018-03-26 NOTE — Progress Notes (Signed)
Cardiology Office Note   Date:  03/26/2018   ID:  Laura Zuniga, DOB [birth year], MRN [id number]  PCP:  Wenda Low, MD    No chief complaint on file.  CAD  Wt Readings from Last 3 Encounters:  03/26/18 150 lb (68 kg)  03/19/18 151 lb 1.6 oz (68.5 kg)  02/25/18 149 lb (67.6 kg)       History of Present Illness: Laura Zuniga is a 84 y.o. female   with a hx of presumed CAD (anterior wall abnormality on nuclear imaging in 8341), diastolic CHF, atrial fibrillation, COPD, HTN. She spends part of her year in [place] and part in [place]. Patient was admitted 6/15 with C. Difficile colitis complicated by atrial fibrillation with RVR. She was placed on amiodarone. She was not felt to be a good candidate for anticoagulation. She converted to NSR. She was then readmitted 6/16 with acute on chronic diastolic CHF. She was diuresed with IV Lasix and d/c back to SNF. FU Holter in 8/15 with NSR, occ junctional rhythm, lowest HR 48; No AFib.   Seen in12/16 to evaluate bradycardia. She was back in AF with SVR (HR in 40s). She did have orthostatic intol but Ortho VS were ok. Case was reviewed with Dr. Virl Axe and she was taken off of Amiodarone. Holter monitor was placed NSR, sinus brady, junctional rhythm and longest pause 2.4 seconds.  She subsequently had symptomatic bradycardia with a pacer placed.  Follows with pulmonary for COPD.  Denies : Chest pain. Dizziness. Nitroglycerin use. Orthopnea. Palpitations. Paroxysmal nocturnal dyspnea. Syncope.   She was hospitalized and found to have interstitial lung disease.  Amiodarone was stopped.  She has maintained sinus rhythm since that time.  She was treated for a COPD exacerbation.  She still has some leg swelling but overall feels better.    Past Medical History:  Diagnosis Date  . Bradycardia    a. Amiodarone DC'd 12/16 >> b. Holter 1/17 - NSR, sinus brady, junctional rhythm, pause 2.4 s  . C. difficile colitis  2015  . CAD (coronary artery disease)    never had cardiac cath, CAD dx by positive ant. wall defect on Nuc. study 2007 >>  no cath due to high risk/advanced age  . Chronic diastolic CHF (congestive heart failure) ([place])    a. Echo 6/15 - Mild LVH, EF 55-60%, mild AI, mild MR, moderate LAE, mild RAE, PASP 52 mm Hg  . CKD (chronic kidney disease)   . COPD (chronic obstructive pulmonary disease) ([place])   . Diverticulitis   . GERD (gastroesophageal reflux disease)   . Hypertension   . Macular degeneration   . PAF (paroxysmal atrial fibrillation) ([facility])    not optimal candidate for anticoagulation    Past Surgical History:  Procedure Laterality Date  . APPENDECTOMY    . CHOLECYSTECTOMY    . EP IMPLANTABLE DEVICE N/A 05/17/2015   Procedure: Pacemaker Implant;  Surgeon: Evans Lance, MD;  Location: [facility];  Service: Cardiovascular;  Laterality: N/A;  . ESOPHAGOGASTRODUODENOSCOPY N/A 09/22/2013   Procedure: ESOPHAGOGASTRODUODENOSCOPY (EGD);  Surgeon: Lear Ng, MD;  Location: [facility] ENDOSCOPY;  Service: Endoscopy;  Laterality: N/A;  . ESOPHAGOGASTRODUODENOSCOPY N/A 09/22/2013   Procedure: ESOPHAGOGASTRODUODENOSCOPY (EGD);  Surgeon: Lear Ng, MD;  Location: [facility] ENDOSCOPY;  Service: Endoscopy;  Laterality: N/A;  . EYE SURGERY    . FLEXIBLE SIGMOIDOSCOPY N/A 09/22/2013   Procedure: FLEXIBLE SIGMOIDOSCOPY;  Surgeon: Lear Ng, MD;  Location: [facility];  Service: Endoscopy;  Laterality: N/A;  . FLEXIBLE SIGMOIDOSCOPY N/A 09/22/2013   Procedure: FLEXIBLE SIGMOIDOSCOPY;  Surgeon: Lear Ng, MD;  Location[facility]COPY;  Service: Endoscopy;  Laterality: N/A;  unprepped/ egd first  . VAGINAL HYSTERECTOMY       Current Outpatient Medications  Medication Sig Dispense Refill  . acetaminophen (TYLENOL) 500 MG tablet Take 2 tablets (1,000 mg total) by mouth every 6 (six) hours as needed for mild pain or moderate pain. 30 tablet 0  . albuterol (PROVENTIL  HFA;VENTOLIN HFA) 108 (90 BASE) MCG/ACT inhaler Inhale 2 puffs into the lungs every 6 (six) hours as needed for wheezing or shortness of breath. 1 Inhaler 3  . benzonatate (TESSALON) 100 MG capsule Take 1 capsule (100 mg total) by mouth every 8 (eight) hours. 21 capsule 0  . Biotin 1 MG CAPS Take 1 mg by mouth daily.    . Carboxymethylcellulose Sodium (EYE DROPS OP) Place 1 drop into both eyes daily as needed (dry eyes).    . clopidogrel (PLAVIX) 75 MG tablet TAKE 1 TABLET BY MOUTH ONCE DAILY. Please make overdue appt with Dr. Irish Lack before anymore refills. 1st attempt 90 tablet 0  . Cranberry 250 MG CAPS Take 250 mg by mouth daily after lunch.     . docusate sodium (COLACE) 100 MG capsule Take 100 mg by mouth daily as needed for mild constipation.    Noelle Penner CLEARLAX powder Take 17 g by mouth daily as needed for constipation.  2  . escitalopram (LEXAPRO) 10 MG tablet Take 15 mg daily by mouth.     . ferrous sulfate 325 (65 FE) MG tablet Take 1 tablet (325 mg total) by mouth every Monday, Wednesday, and Friday. 30 tablet 0  . furosemide (LASIX) 80 MG tablet Take 1 tablet by mouth in the morning, 1/2 tablet (40 mg)  in the evening and can take 1/2 tablet daily as needed for swelling    . guaiFENesin (MUCINEX) 600 MG 12 hr tablet Take 1 tablet (600 mg total) by mouth 2 (two) times daily. 60 tablet 2  . hydrALAZINE (APRESOLINE) 25 MG tablet Take 1 tablet (25 mg total) by mouth 3 (three) times daily. 180 tablet 0  . isosorbide mononitrate (IMDUR) 60 MG 24 hr tablet TAKE 1 TABLET BY MOUTH ONCE DAILY 30 tablet 0  . levothyroxine (SYNTHROID, LEVOTHROID) 50 MCG tablet Take 50 mcg by mouth daily before breakfast.     . Multiple Vitamins-Minerals (ICAPS AREDS 2 PO) Take 1 tablet by mouth daily.    . pantoprazole (PROTONIX) 40 MG tablet Take 40 mg by mouth daily after lunch.     . potassium chloride SA (K-DUR,KLOR-CON) 20 MEQ tablet Take 20 mEq by mouth 2 (two) times daily.     . pravastatin (PRAVACHOL) 40 MG  tablet Take 40 mg daily by mouth.    . predniSONE (DELTASONE) 10 MG tablet Label  & dispense according to the schedule below. 6 Pills PO for three days then, 5 Pills PO for three days, then 4 Pills PO for three days, then 3Pills PO for three days, then 2 Pills PO for three days, then 1 Pill PO for three days,  then STOP.  Total of 63 tabs 63 tablet 0  . senna-docusate (SENOKOT-S) 8.6-50 MG tablet Take 1 tablet by mouth at bedtime. 30 tablet 0  . vitamin E 400 UNIT capsule Take 400 Units by mouth daily after lunch.     No current facility-administered medications for this visit.  Allergies:   Penicillins; 2,4-d dimethylamine (amisol); Ace inhibitors; Lanolin; Sulfa antibiotics; Vancomycin; and Sulfonamide derivatives    Social History:  The patient  reports that she has never smoked. She has never used smokeless tobacco. She reports that she does not drink alcohol or use drugs.   Family History:  The patient's family history includes COPD in her brother and sister; Cancer in her brother, brother, brother, sister, and sister; Cirrhosis in her brother; Heart attack in her sister; Heart attack (age of onset: 75) in her father; Heart disease in her brother; Hypertension in her daughter.    ROS:  Please see the history of present illness.   Otherwise, review of systems are positive for leg swelling.   All other systems are reviewed and negative.    PHYSICAL EXAM: VS:  BP (!) 152/62   Pulse 63   Ht 5' (1.524 m)   Wt 150 lb (68 kg)   SpO2 96%   BMI 29.29 kg/m  , BMI Body mass index is 29.29 kg/m. GEN: Well nourished, well developed, in no acute distress  HEENT: normal  Neck: no JVD, carotid bruits, or masses Cardiac: RRR; 2/6 systolic murmurs, no rubs, or gallops, 2+ lower extremity pitting edema  Respiratory:  clear to auscultation bilaterally, normal work of breathing GI: soft, nontender, nondistended, + BS MS: no deformity or atrophy  Skin: warm and dry, no rash Neuro:  Strength  and sensation are intact Psych: euthymic mood, full affect    Recent Labs: 03/18/2018: B Natriuretic Peptide 141.3 03/20/2018: ALT 13; Magnesium 2.6 03/21/2018: Hemoglobin 11.0; Platelets 211 03/22/2018: BUN 26; Creatinine, Ser 1.27; Potassium 4.3; Sodium 137   Lipid Panel    Component Value Date/Time   CHOL 143 06/28/2011 2230   TRIG 177 (H) 06/28/2011 2230   HDL 38 (L) 06/28/2011 2230   CHOLHDL 3.8 06/28/2011 2230   VLDL 35 06/28/2011 2230   LDLCALC 70 06/28/2011 2230     Other studies Reviewed: Additional studies/ records that were reviewed today with results demonstrating: Creatinine increased slightly after higher dose Lasix in the hospital.   ASSESSMENT AND PLAN:  1. CAD: No angina.  Continue aggressive secondary prevention. 2. Chronic diastolic heart failure: Elevate legs.  Continue current dose of Lasix.  Avoid salt.  Breathing appears stable.  With her advanced age, her activity is limited. 3. Hyperlipidemia: LDL 110 in February 2019. 4. HTN: Borderline.  Continue to avoid salt. 5. Atrial fibrillation: Continue to hold amiodarone.  She will have her pacemaker checked with EP.  If she has A. fib with RVR, we will have to readdress antiarrhythmic therapy.  However, given the interstitial lung disease, will hold amiodarone for now.   Current medicines are reviewed at length with the patient today.  The patient concerns regarding her medicines were addressed.  The following changes have been made:  No change  Labs/ tests ordered today include:  No orders of the defined types were placed in this encounter.   Recommend 150 minutes/week of aerobic exercise Low fat, low carb, high fiber diet recommended  Disposition:   FU in 6 months   Signed, Larae Grooms, MD  03/26/2018 3:43 PM    [facility] [address], [place], [place]  [address] Phone: [phone]; Fax: [phone]

## 2018-03-26 NOTE — Patient Instructions (Signed)

## 2018-04-08 DIAGNOSIS — J441 Chronic obstructive pulmonary disease with (acute) exacerbation: Secondary | ICD-10-CM | POA: Diagnosis not present

## 2018-04-08 DIAGNOSIS — I130 Hypertensive heart and chronic kidney disease with heart failure and stage 1 through stage 4 chronic kidney disease, or unspecified chronic kidney disease: Secondary | ICD-10-CM | POA: Diagnosis not present

## 2018-04-08 DIAGNOSIS — I5033 Acute on chronic diastolic (congestive) heart failure: Secondary | ICD-10-CM | POA: Diagnosis not present

## 2018-04-08 DIAGNOSIS — N183 Chronic kidney disease, stage 3 (moderate): Secondary | ICD-10-CM | POA: Diagnosis not present

## 2018-04-08 DIAGNOSIS — I480 Paroxysmal atrial fibrillation: Secondary | ICD-10-CM | POA: Diagnosis not present

## 2018-04-08 DIAGNOSIS — I5032 Chronic diastolic (congestive) heart failure: Secondary | ICD-10-CM | POA: Diagnosis not present

## 2018-04-10 DIAGNOSIS — N183 Chronic kidney disease, stage 3 (moderate): Secondary | ICD-10-CM | POA: Diagnosis not present

## 2018-04-10 DIAGNOSIS — I130 Hypertensive heart and chronic kidney disease with heart failure and stage 1 through stage 4 chronic kidney disease, or unspecified chronic kidney disease: Secondary | ICD-10-CM | POA: Diagnosis not present

## 2018-04-10 DIAGNOSIS — I480 Paroxysmal atrial fibrillation: Secondary | ICD-10-CM | POA: Diagnosis not present

## 2018-04-10 DIAGNOSIS — I5033 Acute on chronic diastolic (congestive) heart failure: Secondary | ICD-10-CM | POA: Diagnosis not present

## 2018-04-10 DIAGNOSIS — J441 Chronic obstructive pulmonary disease with (acute) exacerbation: Secondary | ICD-10-CM | POA: Diagnosis not present

## 2018-04-15 ENCOUNTER — Ambulatory Visit: Payer: PPO | Admitting: Emergency Medicine

## 2018-04-17 ENCOUNTER — Ambulatory Visit: Payer: PPO | Admitting: Emergency Medicine

## 2018-04-25 ENCOUNTER — Encounter: Payer: Self-pay | Admitting: Internal Medicine

## 2018-04-27 LAB — CUP PACEART REMOTE DEVICE CHECK
Battery Remaining Longevity: 107 mo
Battery Remaining Percentage: 95.5 %
Battery Voltage: 2.99 V
Brady Statistic AP VP Percent: 4.8 %
Brady Statistic AP VS Percent: 95 %
Brady Statistic AS VP Percent: 1 %
Brady Statistic RA Percent Paced: 99 %
Date Time Interrogation Session: 20191128085501
Implantable Lead Implant Date: 20170207
Implantable Lead Implant Date: 20170207
Implantable Lead Location: 753859
Implantable Lead Location: 753860
Implantable Pulse Generator Implant Date: 20170207
Lead Channel Impedance Value: 440 Ohm
Lead Channel Pacing Threshold Amplitude: 1.5 V
Lead Channel Pacing Threshold Pulse Width: 0.5 ms
Lead Channel Pacing Threshold Pulse Width: 0.5 ms
Lead Channel Sensing Intrinsic Amplitude: 1.6 mV
Lead Channel Sensing Intrinsic Amplitude: 8.6 mV
Lead Channel Setting Pacing Amplitude: 2.5 V
Lead Channel Setting Pacing Amplitude: 2.5 V
Lead Channel Setting Pacing Pulse Width: 0.5 ms
Lead Channel Setting Sensing Sensitivity: 2 mV
MDC IDC MSMT LEADCHNL RA IMPEDANCE VALUE: 480 Ohm
MDC IDC MSMT LEADCHNL RV PACING THRESHOLD AMPLITUDE: 0.75 V
MDC IDC STAT BRADY AS VS PERCENT: 1 %
MDC IDC STAT BRADY RV PERCENT PACED: 4.8 %
Pulse Gen Model: 2240
Pulse Gen Serial Number: 7866856

## 2018-04-29 ENCOUNTER — Encounter: Payer: Self-pay | Admitting: Emergency Medicine

## 2018-04-29 ENCOUNTER — Ambulatory Visit (INDEPENDENT_AMBULATORY_CARE_PROVIDER_SITE_OTHER): Payer: PPO | Admitting: Emergency Medicine

## 2018-04-29 DIAGNOSIS — R0602 Shortness of breath: Secondary | ICD-10-CM | POA: Diagnosis not present

## 2018-04-29 DIAGNOSIS — J449 Chronic obstructive pulmonary disease, unspecified: Secondary | ICD-10-CM | POA: Insufficient documentation

## 2018-04-29 DIAGNOSIS — J849 Interstitial pulmonary disease, unspecified: Secondary | ICD-10-CM | POA: Diagnosis not present

## 2018-04-29 NOTE — Progress Notes (Signed)
Subjective:    Patient ID: Laura Zuniga, female    DOB: 09/01/1917, 85 y.o.   MRN: [id number]  Shortness of Breath  Associated symptoms include wheezing. Pertinent negatives include no ear pain, fever, headaches, leg swelling, rash, rhinorrhea, sore throat or vomiting.    ROV 03/23/16 -- This follow-up visit for dyspnea and chronic cough; there has been some speculation that she may have obstructive lung disease, and treated with Breo. Also with some mild interstitial infiltrates on CT scan that may be related to chronic aspiration.  Swallowing evaluation done 02/22/16 showed mild pharyngeal and associated mild aspiration risk. It was recommended that she can you a regular diet, thin liquids and practice swallowing precautions. Pulmonary function testing was performed day and I have personally reviewed. This showed grossly normal spirometry although the was some curve to the flow volume loop. She believes that her cough is a bit better than last time. She remains on Breo - but has ben taking only some days.    ROV 04/29/18 --85 year old never smoker with a history of coronary disease, hypertension, diastolic CHF, atrial fibrillation (pacer).  Have seen her in the past for dyspnea, mild interstitial infiltrates on imaging that we speculated were related to amiodarone toxicity - dose decreased.  She has a history of intermittent aspiration that likely contributed some to cough.  She has GERD, is on Protonix. She has been admitted twice since November for progressive dyspnea, presumed volume overload. Has also been treated for possible bronchitis vs asthma prior to the hospital by Dr Lysle Rubens. She has had waxing / waning edema, takes additional diuretic prn. She has albuterol nebs from Dr Lysle Rubens, unclear that these help her  A ct chest was done during her December hospitalization and I have reviewed.  This shows no significant change in interstitial lung disease compared with priors.   Review of  Systems  Constitutional: Negative for fever and unexpected weight change.  HENT: Negative for congestion, dental problem, ear pain, nosebleeds, postnasal drip, rhinorrhea, sinus pressure, sneezing, sore throat and trouble swallowing.   Eyes: Negative for redness and itching.  Respiratory: Positive for cough, shortness of breath and wheezing. Negative for chest tightness.   Cardiovascular: Negative for palpitations and leg swelling.  Gastrointestinal: Negative for nausea and vomiting.  Genitourinary: Negative for dysuria.  Musculoskeletal: Negative for joint swelling.  Skin: Negative for rash.  Neurological: Negative for headaches.  Hematological: Does not bruise/bleed easily.  Psychiatric/Behavioral: Negative for dysphoric mood. The patient is not nervous/anxious.     Past Medical History:  Diagnosis Date  . Bradycardia    a. Amiodarone DC'd 12/16 >> b. Holter 1/17 - NSR, sinus brady, junctional rhythm, pause 2.4 s  . C. difficile colitis 2015  . CAD (coronary artery disease)    never had cardiac cath, CAD dx by positive ant. wall defect on Nuc. study 2007 >>  no cath due to high risk/advanced age  . Chronic diastolic CHF (congestive heart failure) ([place])    a. Echo 6/15 - Mild LVH, EF 55-60%, mild AI, mild MR, moderate LAE, mild RAE, PASP 52 mm Hg  . CKD (chronic kidney disease)   . COPD (chronic obstructive pulmonary disease) ([place])   . Diverticulitis   . GERD (gastroesophageal reflux disease)   . Hypertension   . Macular degeneration   . PAF (paroxysmal atrial fibrillation) ([facility])    not optimal candidate for anticoagulation     Family History  Problem Relation Age of Onset  . Heart attack  Father 49  . Heart attack Sister   . Cancer Brother   . Cancer Brother   . Cirrhosis Brother   . Heart disease Brother   . Cancer Brother   . COPD Brother   . COPD Sister   . Cancer Sister   . Cancer Sister   . Hypertension Daughter   . Stroke Neg Hx      Social History    Socioeconomic History  . Marital status: Widowed    Spouse name: Not on file  . Number of children: Not on file  . Years of education: Not on file  . Highest education level: Not on file  Occupational History  . Not on file  Social Needs  . Financial resource strain: Not on file  . Food insecurity:    Worry: Not on file    Inability: Not on file  . Transportation needs:    Medical: Not on file    Non-medical: Not on file  Tobacco Use  . Smoking status: Never Smoker  . Smokeless tobacco: Never Used  Substance and Sexual Activity  . Alcohol use: No    Alcohol/week: 0.0 standard drinks  . Drug use: No  . Sexual activity: Never  Lifestyle  . Physical activity:    Days per week: Not on file    Minutes per session: Not on file  . Stress: Not on file  Relationships  . Social connections:    Talks on phone: Not on file    Gets together: Not on file    Attends religious service: Not on file    Active member of club or organization: Not on file    Attends meetings of clubs or organizations: Not on file    Relationship status: Not on file  . Intimate partner violence:    Fear of current or ex partner: Not on file    Emotionally abused: Not on file    Physically abused: Not on file    Forced sexual activity: Not on file  Other Topics Concern  . Not on file  Social History Narrative        father died age 75: [place]         mother died age 12: gallbladder surgery complications         4 brothers: all deceased; prostate ca, throat ca, emphesema, MI, cirrhosis from ETOH         3 sisters: one living: MI, emphesema, 2 sisters with breast CA     Allergies  Allergen Reactions  . Penicillins Other (See Comments)    Unknown allergic reaction Has patient had a PCN reaction causing immediate rash, facial/tongue/throat swelling, SOB or lightheadedness with hypotension: unknown Has patient had a PCN reaction causing severe rash involving mucus membranes or skin necrosis:  unknown Has patient had a PCN reaction that required hospitalization: unknown Has patient had a PCN reaction occurring within the last 10 years: unknown If all of the above answers are "NO", then may proceed with Cephalosporin use.   . 2,4-D Dimethylamine (Amisol) Other (See Comments)  . Ace Inhibitors Other (See Comments)  . Lanolin Hives  . Sulfa Antibiotics   . Vancomycin     Red man syndrome  . Sulfonamide Derivatives Swelling and Rash     Outpatient Medications Prior to Visit  Medication Sig Dispense Refill  . acetaminophen (TYLENOL) 500 MG tablet Take 2 tablets (1,000 mg total) by mouth every 6 (six) hours as needed for mild pain or moderate  pain. 30 tablet 0  . albuterol (PROVENTIL HFA;VENTOLIN HFA) 108 (90 BASE) MCG/ACT inhaler Inhale 2 puffs into the lungs every 6 (six) hours as needed for wheezing or shortness of breath. 1 Inhaler 3  . benzonatate (TESSALON) 100 MG capsule Take 1 capsule (100 mg total) by mouth every 8 (eight) hours. 21 capsule 0  . Biotin 1 MG CAPS Take 1 mg by mouth daily.    . Carboxymethylcellulose Sodium (EYE DROPS OP) Place 1 drop into both eyes daily as needed (dry eyes).    . clopidogrel (PLAVIX) 75 MG tablet TAKE 1 TABLET BY MOUTH ONCE DAILY. 90 tablet 3  . Cranberry 250 MG CAPS Take 250 mg by mouth daily after lunch.     . docusate sodium (COLACE) 100 MG capsule Take 100 mg by mouth daily as needed for mild constipation.    Noelle Penner CLEARLAX powder Take 17 g by mouth daily as needed for constipation.  2  . escitalopram (LEXAPRO) 10 MG tablet Take 15 mg daily by mouth.     . ferrous sulfate 325 (65 FE) MG tablet Take 1 tablet (325 mg total) by mouth every Monday, Wednesday, and Friday. 30 tablet 0  . furosemide (LASIX) 80 MG tablet Take 1 tablet by mouth in the morning, 1/2 tablet (40 mg)  in the evening and can take 1/2 tablet daily as needed for swelling    . guaiFENesin (MUCINEX) 600 MG 12 hr tablet Take 1 tablet (600 mg total) by mouth 2 (two) times  daily. 60 tablet 2  . hydrALAZINE (APRESOLINE) 25 MG tablet Take 1 tablet (25 mg total) by mouth 3 (three) times daily. 180 tablet 0  . isosorbide mononitrate (IMDUR) 60 MG 24 hr tablet TAKE 1 TABLET BY MOUTH ONCE DAILY 30 tablet 0  . levothyroxine (SYNTHROID, LEVOTHROID) 50 MCG tablet Take 50 mcg by mouth daily before breakfast.     . Multiple Vitamins-Minerals (ICAPS AREDS 2 PO) Take 1 tablet by mouth daily.    . pantoprazole (PROTONIX) 40 MG tablet Take 40 mg by mouth daily after lunch.     . potassium chloride SA (K-DUR,KLOR-CON) 20 MEQ tablet Take 20 mEq by mouth 2 (two) times daily.     . pravastatin (PRAVACHOL) 40 MG tablet Take 40 mg daily by mouth.    . senna-docusate (SENOKOT-S) 8.6-50 MG tablet Take 1 tablet by mouth at bedtime. 30 tablet 0  . vitamin E 400 UNIT capsule Take 400 Units by mouth daily after lunch.    . predniSONE (DELTASONE) 10 MG tablet Label  & dispense according to the schedule below. 6 Pills PO for three days then, 5 Pills PO for three days, then 4 Pills PO for three days, then 3Pills PO for three days, then 2 Pills PO for three days, then 1 Pill PO for three days,  then STOP.  Total of 63 tabs 63 tablet 0   No facility-administered medications prior to visit.         Objective:   Physical Exam Vitals:   04/29/18 1453  BP: 118/70  Pulse: 68  SpO2: 94%  Weight: 149 lb (67.6 kg)  Height: 5\' 1"  (1.549 m)   Gen: Pleasant, elderly woman, in no distress,  normal affect  ENT: No lesions,  mouth clear,  oropharynx clear, no postnasal drip, poor dentition  Neck: No JVD, no stridor  Lungs: No use of accessory muscles, right basilar insp crackles.   Cardiovascular: RRR, heart sounds normal, no murmur or gallops, no  peripheral edema  Musculoskeletal: No deformities, no cyanosis or clubbing  Neuro: alert, non focal  Skin: Warm, no lesions or rashes       Assessment & Plan:  Obstructive lung disease (generalized) ([place]) Her pulmonary function testing is  grossly normal from 2017.  She does have some curve to her flow volume loop but she is never had a clinical syndrome consistent with COPD.  She was treated empirically for possible COPD exacerbations in November leading up to 2 hospitalizations, the last in December where she improved after diuresis.  At that time her amiodarone was stopped.  It is not clear to me that she has clinically significant COPD.  She has albuterol nebs at home and she is not sure that they help her.  She does have occasional wheezing that sounds like upper airway noise by her description.  She has intermittent dysphasia and certainly some of this could be due to upper airway secretions and secretion management.  I do not think there is any harm in her using the albuterol on an as-needed basis unless it causes tachycardia.  Not clear to me that she has benefited truly from prednisone that is been given over the last few months.  ILD (interstitial lung disease) ([place]) Stable by repeat CT scan of the chest that was done during her December hospitalization.  I did not see any evidence of progression, the disease is mild.  I do not disagree with stopping the amiodarone which was done recently.  SOB (shortness of breath) I think that in large part this is due to her volume status and they are using Lasix with additional dosing based on her weight gain or edema accumulation.  I also think she has some unstable upper airway irritation at times, possibly related to intermittent aspiration, that leads to wheezing.  This also probably puts her at risk for bronchitis.  Baltazar Apo, MD, PhD 04/29/2018, 4:08 PM [facility] [phone] or if no answer [phone]

## 2018-04-29 NOTE — Assessment & Plan Note (Signed)
I think that in large part this is due to her volume status and they are using Lasix with additional dosing based on her weight gain or edema accumulation.  I also think she has some unstable upper airway irritation at times, possibly related to intermittent aspiration, that leads to wheezing.  This also probably puts her at risk for bronchitis.

## 2018-04-29 NOTE — Assessment & Plan Note (Signed)
Stable by repeat CT scan of the chest that was done during her December hospitalization.  I did not see any evidence of progression, the disease is mild.  I do not disagree with stopping the amiodarone which was done recently.

## 2018-04-29 NOTE — Patient Instructions (Addendum)
Your prior breathing test do not support a diagnosis of significant COPD.  Therefore is not clear that you need or will benefit from inhaled medication or prednisone. You are at risk for inflammation in the lungs, bronchitis or even pneumonia if you have episodes of aspirating when you eat drink.  Please be careful to avoid this. Agree with stopping amiodarone Continue your diuretic medications as directed by Dr. Irish Lack Follow with Dr Lamonte Sakai in 6 months or sooner if you have any problems

## 2018-04-29 NOTE — Assessment & Plan Note (Signed)
Her pulmonary function testing is grossly normal from 2017.  She does have some curve to her flow volume loop but she is never had a clinical syndrome consistent with COPD.  She was treated empirically for possible COPD exacerbations in November leading up to 2 hospitalizations, the last in December where she improved after diuresis.  At that time her amiodarone was stopped.  It is not clear to me that she has clinically significant COPD.  She has albuterol nebs at home and she is not sure that they help her.  She does have occasional wheezing that sounds like upper airway noise by her description.  She has intermittent dysphasia and certainly some of this could be due to upper airway secretions and secretion management.  I do not think there is any harm in her using the albuterol on an as-needed basis unless it causes tachycardia.  Not clear to me that she has benefited truly from prednisone that is been given over the last few months.

## 2018-05-08 ENCOUNTER — Other Ambulatory Visit: Payer: Self-pay | Admitting: Interventional Cardiology

## 2018-05-09 DIAGNOSIS — I5032 Chronic diastolic (congestive) heart failure: Secondary | ICD-10-CM | POA: Diagnosis not present

## 2018-05-09 DIAGNOSIS — I480 Paroxysmal atrial fibrillation: Secondary | ICD-10-CM | POA: Diagnosis not present

## 2018-05-13 ENCOUNTER — Ambulatory Visit (INDEPENDENT_AMBULATORY_CARE_PROVIDER_SITE_OTHER): Payer: PPO | Admitting: Internal Medicine

## 2018-05-13 ENCOUNTER — Encounter: Payer: Self-pay | Admitting: Internal Medicine

## 2018-05-13 VITALS — BP 124/72 | HR 71 | Ht 61.0 in | Wt 150.0 lb

## 2018-05-13 DIAGNOSIS — Z950 Presence of cardiac pacemaker: Secondary | ICD-10-CM

## 2018-05-13 DIAGNOSIS — I495 Sick sinus syndrome: Secondary | ICD-10-CM | POA: Diagnosis not present

## 2018-05-13 DIAGNOSIS — I480 Paroxysmal atrial fibrillation: Secondary | ICD-10-CM | POA: Diagnosis not present

## 2018-05-13 NOTE — Patient Instructions (Signed)
Medication Instructions:  Your physician recommends that you continue on your current medications as directed. Please refer to the Current Medication list given to you today.  Labwork: None ordered.  Testing/Procedures: None ordered.  Follow-Up: Your physician wants you to follow-up in: one year with Dr. Lovena Le.   You will receive a reminder letter in the mail two months in advance. If you don't receive a letter, please call our office to schedule the follow-up appointment.  Remote monitoring is used to monitor your Pacemaker from home. This monitoring reduces the number of office visits required to check your device to one time per year. It allows [place] to keep an eye on the functioning of your device to ensure it is working properly. You are scheduled for a device check from home on 06/09/2018. You may send your transmission at any time that day. If you have a wireless device, the transmission will be sent automatically. After your physician reviews your transmission, you will receive a postcard with your next transmission date.  Any Other Special Instructions Will Be Listed Below (If Applicable).  If you need a refill on your cardiac medications before your next appointment, please call your pharmacy.

## 2018-05-13 NOTE — Progress Notes (Signed)
HPI [name] returns today for followup. She is a pleasant 85 yo woman with sinus node dysfunction, s/p PPM insertion, chronic dyspnea, and peripheral edema. She denies chest pain. She has become more sedentary in the past year. Allergies  Allergen Reactions  . Penicillins Other (See Comments)    Unknown allergic reaction Has patient had a PCN reaction causing immediate rash, facial/tongue/throat swelling, SOB or lightheadedness with hypotension: unknown Has patient had a PCN reaction causing severe rash involving mucus membranes or skin necrosis: unknown Has patient had a PCN reaction that required hospitalization: unknown Has patient had a PCN reaction occurring within the last 10 years: unknown If all of the above answers are "NO", then may proceed with Cephalosporin use.   . 2,4-D Dimethylamine (Amisol) Other (See Comments)  . Ace Inhibitors Other (See Comments)  . Lanolin Hives  . Sulfa Antibiotics   . Vancomycin     Red man syndrome  . Sulfonamide Derivatives Swelling and Rash     Current Outpatient Medications  Medication Sig Dispense Refill  . acetaminophen (TYLENOL) 500 MG tablet Take 2 tablets (1,000 mg total) by mouth every 6 (six) hours as needed for mild pain or moderate pain. 30 tablet 0  . albuterol (PROVENTIL HFA;VENTOLIN HFA) 108 (90 BASE) MCG/ACT inhaler Inhale 2 puffs into the lungs every 6 (six) hours as needed for wheezing or shortness of breath. 1 Inhaler 3  . Biotin 1 MG CAPS Take 1 mg by mouth daily.    . Carboxymethylcellulose Sodium (EYE DROPS OP) Place 1 drop into both eyes daily as needed (dry eyes).    . clopidogrel (PLAVIX) 75 MG tablet TAKE 1 TABLET BY MOUTH ONCE DAILY. 90 tablet 3  . Cranberry 250 MG CAPS Take 250 mg by mouth daily after lunch.     . docusate sodium (COLACE) 100 MG capsule Take 100 mg by mouth daily as needed for mild constipation.    Noelle Penner CLEARLAX powder Take 17 g by mouth daily as needed for constipation.  2  .  escitalopram (LEXAPRO) 10 MG tablet Take 15 mg daily by mouth.     . furosemide (LASIX) 80 MG tablet Take 1 tablet by mouth in the morning, 1/2 tablet (40 mg)  in the evening and can take 1/2 tablet daily as needed for swelling    . hydrALAZINE (APRESOLINE) 25 MG tablet Take 1 tablet (25 mg total) by mouth 3 (three) times daily. 180 tablet 0  . isosorbide mononitrate (IMDUR) 60 MG 24 hr tablet TAKE 1 TABLET BY MOUTH ONCE DAILY 30 tablet 6  . levothyroxine (SYNTHROID, LEVOTHROID) 50 MCG tablet Take 50 mcg by mouth daily before breakfast.     . Multiple Vitamins-Minerals (ICAPS AREDS 2 PO) Take 1 tablet by mouth daily.    . pantoprazole (PROTONIX) 40 MG tablet Take 40 mg by mouth daily after lunch.     . potassium chloride SA (K-DUR,KLOR-CON) 20 MEQ tablet Take 20 mEq by mouth 2 (two) times daily.     . pravastatin (PRAVACHOL) 40 MG tablet Take 40 mg daily by mouth.    . senna-docusate (SENOKOT-S) 8.6-50 MG tablet Take 1 tablet by mouth at bedtime. 30 tablet 0  . vitamin E 400 UNIT capsule Take 400 Units by mouth daily after lunch.     No current facility-administered medications for this visit.      Past Medical History:  Diagnosis Date  . Bradycardia    a. Amiodarone DC'd 12/16 >> b.  Holter 1/17 - NSR, sinus brady, junctional rhythm, pause 2.4 s  . C. difficile colitis 2015  . CAD (coronary artery disease)    never had cardiac cath, CAD dx by positive ant. wall defect on Nuc. study 2007 >>  no cath due to high risk/advanced age  . Chronic diastolic CHF (congestive heart failure) ([place])    a. Echo 6/15 - Mild LVH, EF 55-60%, mild AI, mild MR, moderate LAE, mild RAE, PASP 52 mm Hg  . CKD (chronic kidney disease)   . COPD (chronic obstructive pulmonary disease) ([place])   . Diverticulitis   . GERD (gastroesophageal reflux disease)   . Hypertension   . Macular degeneration   . PAF (paroxysmal atrial fibrillation) ([facility])    not optimal candidate for anticoagulation    ROS:   All systems  reviewed and negative except as noted in the HPI.   Past Surgical History:  Procedure Laterality Date  . APPENDECTOMY    . CHOLECYSTECTOMY    . EP IMPLANTABLE DEVICE N/A 05/17/2015   Procedure: Pacemaker Implant;  Surgeon: Evans Lance, MD;  Location: [facility];  Service: Cardiovascular;  Laterality: N/A;  . ESOPHAGOGASTRODUODENOSCOPY N/A 09/22/2013   Procedure: ESOPHAGOGASTRODUODENOSCOPY (EGD);  Surgeon: Lear Ng, MD;  Location: [facility] ENDOSCOPY;  Service: Endoscopy;  Laterality: N/A;  . ESOPHAGOGASTRODUODENOSCOPY N/A 09/22/2013   Procedure: ESOPHAGOGASTRODUODENOSCOPY (EGD);  Surgeon: Lear Ng, MD;  Location: [facility] ENDOSCOPY;  Service: Endoscopy;  Laterality: N/A;  . EYE SURGERY    . FLEXIBLE SIGMOIDOSCOPY N/A 09/22/2013   Procedure: FLEXIBLE SIGMOIDOSCOPY;  Surgeon: Lear Ng, MD;  [facility] ENDOSCOPY;  Service: Endoscopy;  Laterality: N/A;  . FLEXIBLE SIGMOIDOSCOPY N/A 09/22/2013   Procedure: FLEXIBLE SIGMOIDOSCOPY;  Surgeon: Lear Ng, MD;  Location: [facility] ENDOSCOPY;  Service: Endoscopy;  Laterality: N/A;  unprepped/ egd first  . VAGINAL HYSTERECTOMY       Family History  Problem Relation Age of Onset  . Heart attack Father 76  . Heart attack Sister   . Cancer Brother   . Cancer Brother   . Cirrhosis Brother   . Heart disease Brother   . Cancer Brother   . COPD Brother   . COPD Sister   . Cancer Sister   . Cancer Sister   . Hypertension Daughter   . Stroke Neg Hx      Social History   Socioeconomic History  . Marital status: Widowed    Spouse name: Not on file  . Number of children: Not on file  . Years of education: Not on file  . Highest education level: Not on file  Occupational History  . Not on file  Social Needs  . Financial resource strain: Not on file  . Food insecurity:    Worry: Not on file    Inability: Not on file  . Transportation needs:    Medical: Not on file    Non-medical: Not on file  Tobacco Use    . Smoking status: Never Smoker  . Smokeless tobacco: Never Used  Substance and Sexual Activity  . Alcohol use: No    Alcohol/week: 0.0 standard drinks  . Drug use: No  . Sexual activity: Never  Lifestyle  . Physical activity:    Days per week: Not on file    Minutes per session: Not on file  . Stress: Not on file  Relationships  . Social connections:    Talks on phone: Not on file    Gets together: Not  on file    Attends religious service: Not on file    Active member of club or organization: Not on file    Attends meetings of clubs or organizations: Not on file    Relationship status: Not on file  . Intimate partner violence:    Fear of current or ex partner: Not on file    Emotionally abused: Not on file    Physically abused: Not on file    Forced sexual activity: Not on file  Other Topics Concern  . Not on file  Social History Narrative        father died age 38: [place] MI         mother died age 68: gallbladder surgery complications         4 brothers: all deceased; prostate ca, throat ca, emphesema, MI, cirrhosis from ETOH         3 sisters: one living: MI, emphesema, 2 sisters with breast CA     BP 124/72   Pulse 71   Ht 5\' 1"  (1.549 m)   Wt 150 lb (68 kg)   SpO2 95%   BMI 28.34 kg/m   Physical Exam:  Well appearing elderly woman, NAD HEENT: Unremarkable except for a conjuctival hemorrhage. Neck:  6 cm JVD, no thyromegally Lymphatics:  No adenopathy Back:  No CVA tenderness Lungs:  Clear with no wheezes HEART:  Regular rate rhythm, no murmurs, no rubs, no clicks Abd:  soft, positive bowel sounds, no organomegally, no rebound, no guarding Ext:  2 plus pulses, trace peripheral edema, no cyanosis, no clubbing Skin:  No rashes no nodules Neuro:  CN II through XII intact, motor grossly intact   DEVICE  Normal device function.  See PaceArt for details.   Assess/Plan: 1. Sinus node dysfunction - she is asymptomatic, s/p PPM insertion.  2. PAF - she is  asymptomatic. She has had an episode of atrial fib lasting 5 hours. I am worried about both bleeding and clotting. For now we will hold off on systemic anti-coagulation as I am more concerned about her propensity to bleed.  She has a conjuctival hemorrhage today.  3. PPM - her [employer] DDD PM is working normally. We will follow. 4. Venous insufficiency - she has some hperpigmented areas but minimal swelling today. She is encouraged to keep her legs elevated.  Mikle Bosworth.D.

## 2018-05-14 LAB — CUP PACEART INCLINIC DEVICE CHECK
Battery Remaining Longevity: 108 mo
Battery Voltage: 2.99 V
Brady Statistic RA Percent Paced: 96 %
Brady Statistic RV Percent Paced: 4.3 %
Date Time Interrogation Session: 20200204210652
Implantable Lead Implant Date: 20170207
Implantable Lead Implant Date: 20170207
Implantable Lead Location: 753859
Implantable Pulse Generator Implant Date: 20170207
Lead Channel Impedance Value: 375 Ohm
Lead Channel Impedance Value: 437.5 Ohm
Lead Channel Pacing Threshold Amplitude: 0.75 V
Lead Channel Pacing Threshold Amplitude: 1.25 V
Lead Channel Pacing Threshold Pulse Width: 0.6 ms
Lead Channel Sensing Intrinsic Amplitude: 2.4 mV
Lead Channel Sensing Intrinsic Amplitude: 5.6 mV
Lead Channel Setting Pacing Amplitude: 2.5 V
Lead Channel Setting Pacing Amplitude: 2.5 V
Lead Channel Setting Pacing Pulse Width: 0.5 ms
Lead Channel Setting Sensing Sensitivity: 2 mV
MDC IDC LEAD LOCATION: 753860
MDC IDC MSMT LEADCHNL RV PACING THRESHOLD PULSEWIDTH: 0.5 ms
Pulse Gen Model: 2240
Pulse Gen Serial Number: 7866856

## 2018-05-25 ENCOUNTER — Emergency Department (HOSPITAL_COMMUNITY)
Admission: EM | Admit: 2018-05-25 | Discharge: 2018-05-25 | Disposition: A | Discharge status: Home / Self Care | Payer: PPO | Attending: Emergency Medicine | Admitting: Emergency Medicine

## 2018-05-25 ENCOUNTER — Other Ambulatory Visit: Payer: Self-pay

## 2018-05-25 ENCOUNTER — Encounter (HOSPITAL_COMMUNITY): Payer: Self-pay | Admitting: Emergency Medicine

## 2018-05-25 ENCOUNTER — Emergency Department (HOSPITAL_COMMUNITY): Payer: PPO

## 2018-05-25 DIAGNOSIS — F0390 Unspecified dementia without behavioral disturbance: Secondary | ICD-10-CM | POA: Insufficient documentation

## 2018-05-25 DIAGNOSIS — I5032 Chronic diastolic (congestive) heart failure: Secondary | ICD-10-CM | POA: Diagnosis not present

## 2018-05-25 DIAGNOSIS — Z950 Presence of cardiac pacemaker: Secondary | ICD-10-CM | POA: Diagnosis not present

## 2018-05-25 DIAGNOSIS — I959 Hypotension, unspecified: Secondary | ICD-10-CM | POA: Diagnosis not present

## 2018-05-25 DIAGNOSIS — E039 Hypothyroidism, unspecified: Secondary | ICD-10-CM | POA: Insufficient documentation

## 2018-05-25 DIAGNOSIS — J40 Bronchitis, not specified as acute or chronic: Secondary | ICD-10-CM | POA: Diagnosis not present

## 2018-05-25 DIAGNOSIS — I130 Hypertensive heart and chronic kidney disease with heart failure and stage 1 through stage 4 chronic kidney disease, or unspecified chronic kidney disease: Secondary | ICD-10-CM | POA: Insufficient documentation

## 2018-05-25 DIAGNOSIS — I2510 Atherosclerotic heart disease of native coronary artery without angina pectoris: Secondary | ICD-10-CM | POA: Diagnosis not present

## 2018-05-25 DIAGNOSIS — N183 Chronic kidney disease, stage 3 (moderate): Secondary | ICD-10-CM | POA: Diagnosis not present

## 2018-05-25 DIAGNOSIS — Z79899 Other long term (current) drug therapy: Secondary | ICD-10-CM | POA: Insufficient documentation

## 2018-05-25 DIAGNOSIS — J80 Acute respiratory distress syndrome: Secondary | ICD-10-CM | POA: Diagnosis not present

## 2018-05-25 DIAGNOSIS — R069 Unspecified abnormalities of breathing: Secondary | ICD-10-CM | POA: Diagnosis not present

## 2018-05-25 DIAGNOSIS — R0602 Shortness of breath: Secondary | ICD-10-CM | POA: Diagnosis not present

## 2018-05-25 DIAGNOSIS — Z7901 Long term (current) use of anticoagulants: Secondary | ICD-10-CM | POA: Insufficient documentation

## 2018-05-25 DIAGNOSIS — R062 Wheezing: Secondary | ICD-10-CM | POA: Diagnosis not present

## 2018-05-25 LAB — CBC
HCT: 36.2 % (ref 36.0–46.0)
Hemoglobin: 11.5 g/dL — ABNORMAL LOW (ref 12.0–15.0)
MCH: 32.7 pg (ref 26.0–34.0)
MCHC: 31.8 g/dL (ref 30.0–36.0)
MCV: 102.8 fL — ABNORMAL HIGH (ref 80.0–100.0)
PLATELETS: 210 10*3/uL (ref 150–400)
RBC: 3.52 MIL/uL — ABNORMAL LOW (ref 3.87–5.11)
RDW: 12.5 % (ref 11.5–15.5)
WBC: 8.6 10*3/uL (ref 4.0–10.5)
nRBC: 0 % (ref 0.0–0.2)

## 2018-05-25 LAB — BASIC METABOLIC PANEL
Anion gap: 12 (ref 5–15)
BUN: 7 mg/dL — ABNORMAL LOW (ref 8–23)
CO2: 32 mmol/L (ref 22–32)
Calcium: 9.3 mg/dL (ref 8.9–10.3)
Chloride: 96 mmol/L — ABNORMAL LOW (ref 98–111)
Creatinine, Ser: 1.07 mg/dL — ABNORMAL HIGH (ref 0.44–1.00)
GFR calc non Af Amer: 43 mL/min — ABNORMAL LOW (ref >60)
GFR, EST AFRICAN AMERICAN: 50 mL/min — AB (ref >60)
Glucose, Bld: 122 mg/dL — ABNORMAL HIGH (ref 70–99)
Potassium: 3.2 mmol/L — ABNORMAL LOW (ref 3.5–5.1)
Sodium: 140 mmol/L (ref 135–145)

## 2018-05-25 LAB — I-STAT TROPONIN, ED: Troponin i, poc: 0.01 ng/mL (ref 0.00–0.08)

## 2018-05-25 LAB — BRAIN NATRIURETIC PEPTIDE: B Natriuretic Peptide: 174.2 pg/mL — ABNORMAL HIGH (ref 0.0–100.0)

## 2018-05-25 MED ORDER — METHYLPREDNISOLONE SODIUM SUCC 125 MG IJ SOLR
80.0000 mg | Freq: Once | INTRAMUSCULAR | Status: AC
  Administered 2018-05-25: 80 mg via INTRAVENOUS
  Filled 2018-05-25: qty 2

## 2018-05-25 MED ORDER — BENZONATATE 100 MG PO CAPS: 100.0000 mg | ORAL_CAPSULE | Freq: Three times a day (TID) | ORAL | 0 refills | Status: AC

## 2018-05-25 MED ORDER — PREDNISONE 10 MG (21) PO TBPK: ORAL_TABLET | ORAL | 0 refills | Status: AC

## 2018-05-25 NOTE — ED Provider Notes (Signed)
[facility] EMERGENCY DEPARTMENT Provider Note   CSN: [id number] Arrival date & time: 05/25/18  [place]     History   Chief Complaint Chief Complaint  Patient presents with  . Shortness of Breath    HPI Laura Zuniga is a 85 y.o. female.  HPI Pt started getting a cold a few weeks ago.  She saw her doctor and was prescribed medications for a cough syrup.  Pt states she has not been getting better and the last few nights she has felt worse.  She has not been sleeping well.  She has been coughing constantly.  Today she felt short of breath and called 911.  No fevers.  Once in a while chest pain but none now or recently.  No leg swelling today.  Past Medical History:  Diagnosis Date  . Bradycardia    a. Amiodarone DC'd 12/16 >> b. Holter 1/17 - NSR, sinus brady, junctional rhythm, pause 2.4 s  . C. difficile colitis 2015  . CAD (coronary artery disease)    never had cardiac cath, CAD dx by positive ant. wall defect on Nuc. study 2007 >>  no cath due to high risk/advanced age  . Chronic diastolic CHF (congestive heart failure) ([place])    a. Echo 6/15 - Mild LVH, EF 55-60%, mild AI, mild MR, moderate LAE, mild RAE, PASP 52 mm Hg  . CKD (chronic kidney disease)   . COPD (chronic obstructive pulmonary disease) ([place])   . Diverticulitis   . GERD (gastroesophageal reflux disease)   . Hypertension   . Macular degeneration   . PAF (paroxysmal atrial fibrillation) ([place])    not optimal candidate for anticoagulation    Patient Active Problem List   Diagnosis Date Noted  . Obstructive lung disease (generalized) ([place]) 04/29/2018  . SOB (shortness of breath)   . Hypokalemia 03/18/2018  . Lactic acidosis 03/18/2018  . Sepsis ([place]) 07/03/2016  . Abnormal urinalysis 07/03/2016  . Dysphagia (mild-tolerates regular/thin liquids w/ swallow techniques) 07/03/2016  . Pulmonary HTN ([place]) 07/03/2016  . Transaminitis 07/03/2016  . Hypothyroidism 07/03/2016  . Elevated liver  function tests   . UTI (urinary tract infection) with pyuria   . ILD (interstitial lung disease) ([place]) 02/17/2016  . Chronic cough 02/17/2016  . Cardiac pacemaker 02/16/2016  . Sinus node dysfunction ([place]) 05/05/2015  . CKD (chronic kidney disease), stage III ([place]) 09/14/2014  . GERD (gastroesophageal reflux disease) 10/29/2013  . Hyponatremia 10/21/2013  . Acute blood loss anemia 10/01/2013  . Urinary retention 10/01/2013  . Chronic diastolic heart failure ([place]) 09/28/2013  . PAF (paroxysmal atrial fibrillation) ([place]) 09/27/2013  . Melena 09/22/2013  . ATN (acute tubular necrosis) ([place]) 09/21/2013  . Nonspecific abnormal results of cardiovascular function study 06/11/2013  . Syncope 06/28/2011  . Swelling of left lower extremity 06/28/2011  . DEMENTIA, CCE, W/O BEHAVIORAL DISTURBANCE 01/17/2007  . RENAL STONE 12/04/2006  . HLD (hyperlipidemia) 07/15/2006  . Depression 07/15/2006  . BLINDNESS, LEFT EYE 07/15/2006  . Essential hypertension 07/15/2006  . CAD (coronary artery disease) 07/15/2006  . OSTEOPOROSIS 07/15/2006    Past Surgical History:  Procedure Laterality Date  . APPENDECTOMY    . CHOLECYSTECTOMY    . EP IMPLANTABLE DEVICE N/A 05/17/2015   Procedure: Pacemaker Implant;  Surgeon: Evans Lance, MD;  Location: [facility];  Service: Cardiovascular;  Laterality: N/A;  . ESOPHAGOGASTRODUODENOSCOPY N/A 09/22/2013   Procedure: ESOPHAGOGASTRODUODENOSCOPY (EGD);  Surgeon: Lear Ng, MD;  Location: [facility] ENDOSCOPY;  Service: Endoscopy;  Laterality: N/A;  . ESOPHAGOGASTRODUODENOSCOPY N/A 09/22/2013   Procedure: ESOPHAGOGASTRODUODENOSCOPY (EGD);  Surgeon: Lear Ng, MD;  Location: [facility] ENDOSCOPY;  Service: Endoscopy;  Laterality: N/A;  . EYE SURGERY    . FLEXIBLE SIGMOIDOSCOPY N/A 09/22/2013   Procedure: FLEXIBLE SIGMOIDOSCOPY;  Surgeon: Lear Ng, MD;  Location: [facility] ENDOSCOPY;  Service: Endoscopy;  Laterality: N/A;  . FLEXIBLE SIGMOIDOSCOPY N/A  09/22/2013   Procedure: FLEXIBLE SIGMOIDOSCOPY;  Surgeon: Lear Ng, MD;  Location: [facility] ENDOSCOPY;  Service: Endoscopy;  Laterality: N/A;  unprepped/ egd first  . VAGINAL HYSTERECTOMY       OB History   No obstetric history on file.      Home Medications    Prior to Admission medications   Medication Sig Start Date End Date Taking? Authorizing Provider  acetaminophen (TYLENOL) 500 MG tablet Take 2 tablets (1,000 mg total) by mouth every 6 (six) hours as needed for mild pain or moderate pain. 05/27/16  Yes Duffy Bruce, MD  albuterol (PROVENTIL HFA;VENTOLIN HFA) 108 (90 BASE) MCG/ACT inhaler Inhale 2 puffs into the lungs every 6 (six) hours as needed for wheezing or shortness of breath. 02/23/15  Yes Deloria Lair, NP  albuterol (PROVENTIL) (2.5 MG/3ML) 0.083% nebulizer solution Take 2.5 mg by nebulization every 4 (four) hours as needed for wheezing or shortness of breath.    Yes [provider]  Ascorbic Acid (VITAMIN C) 1000 MG tablet Take 1,000 mg by mouth daily after lunch.   Yes [provider]  Biotin 1 MG CAPS Take 1 mg by mouth daily after lunch.    Yes [provider]  Calcium Carb-Cholecalciferol (CALCIUM 600-D PO) Take 1 tablet by mouth at bedtime.   Yes [provider]  Carboxymethylcellulose Sodium (EYE DROPS OP) Place 1 drop into both eyes at bedtime as needed (dry eyes).    Yes [provider]  clopidogrel (PLAVIX) 75 MG tablet TAKE 1 TABLET BY MOUTH ONCE DAILY. Patient taking differently: Take 75 mg by mouth daily.  03/27/18  Yes Jettie Booze, MD  Cranberry 250 MG CAPS Take 250 mg by mouth daily after lunch.    Yes [provider]  docusate sodium (COLACE) 100 MG capsule Take 100-200 mg by mouth at bedtime as needed for mild constipation.    Yes [provider]  escitalopram (LEXAPRO) 10 MG tablet Take 10 mg by mouth daily.    Yes [provider]  ferrous sulfate 325 (65 FE) MG tablet  Take 325 mg by mouth daily after lunch.   Yes [provider]  furosemide (LASIX) 80 MG tablet Take 40-80 mg by mouth See admin instructions. Take one tablet (80 mg) by mouth every morning and 1/2 tablet (40 mg) every afternoon, may increase afternoon dose to 1 tablet (80 mg) as needed for fluid or weight gain   Yes [provider]  hydrALAZINE (APRESOLINE) 25 MG tablet Take 1 tablet (25 mg total) by mouth 3 (three) times daily. 03/22/18  Yes Florencia Reasons, MD  isosorbide mononitrate (IMDUR) 60 MG 24 hr tablet TAKE 1 TABLET BY MOUTH ONCE DAILY Patient taking differently: Take 60 mg by mouth daily.  05/08/18  Yes Jettie Booze, MD  levothyroxine (SYNTHROID, LEVOTHROID) 50 MCG tablet Take 50 mcg by mouth daily before breakfast.  06/04/14  Yes [provider]  Multiple Vitamins-Minerals (ICAPS AREDS 2 PO) Take 1 tablet by mouth daily after lunch.    Yes [provider]  nitroGLYCERIN (NITROSTAT) 0.4 MG SL tablet Place  0.4 mg under the tongue every 5 (five) minutes as needed for chest pain.   Yes [provider]  pantoprazole (PROTONIX) 40 MG tablet Take 40 mg by mouth daily after lunch.    Yes [provider]  potassium chloride SA (K-DUR,KLOR-CON) 20 MEQ tablet Take 20 mEq by mouth See admin instructions. Take one tablet (20 meq) by mouth twice daily - morning and afternoon 05/20/14  Yes [provider]  pravastatin (PRAVACHOL) 40 MG tablet Take 40 mg by mouth at bedtime.  01/07/17  Yes [provider]  vitamin E 400 UNIT capsule Take 400 Units by mouth daily after lunch.   Yes [provider]  benzonatate (TESSALON) 100 MG capsule Take 1 capsule (100 mg total) by mouth every 8 (eight) hours. 05/25/18   Dorie Rank, MD  predniSONE (STERAPRED UNI-PAK 21 TAB) 10 MG (21) TBPK tablet Take 4 tablets (40 mg total) by mouth daily for 3 days, THEN 3 tablets (30 mg total) daily for 3 days, THEN 2 tablets (20 mg total) daily for 3 days,  THEN 1 tablet (10 mg total) daily for 3 days. 05/25/18 06/06/18  Dorie Rank, MD  senna-docusate (SENOKOT-S) 8.6-50 MG tablet Take 1 tablet by mouth at bedtime. Patient not taking: Reported on 05/25/2018 03/22/18   Florencia Reasons, MD    Family History Family History  Problem Relation Age of Onset  . Heart attack Father 18  . Heart attack Sister   . Cancer Brother   . Cancer Brother   . Cirrhosis Brother   . Heart disease Brother   . Cancer Brother   . COPD Brother   . COPD Sister   . Cancer Sister   . Cancer Sister   . Hypertension Daughter   . Stroke Neg Hx     Social History Social History   Tobacco Use  . Smoking status: Never Smoker  . Smokeless tobacco: Never Used  Substance Use Topics  . Alcohol use: No    Alcohol/week: 0.0 standard drinks  . Drug use: No     Allergies   Penicillins; 2,4-d dimethylamine (amisol); Ace inhibitors; Lanolin; Sulfa antibiotics; Vancomycin; and Sulfonamide derivatives   Review of Systems Review of Systems  All other systems reviewed and are negative.    Physical Exam Updated Vital Signs BP (!) 128/54   Pulse 78   Temp 98.2 F (36.8 C) (Oral)   Resp 19   Ht 1.549 m (5\' 1" )   Wt 68 kg   SpO2 92%   BMI 28.34 kg/m   Physical Exam Vitals signs and nursing note reviewed.  Constitutional:      General: She is not in acute distress.    Appearance: She is well-developed.  HENT:     Head: Normocephalic and atraumatic.     Right Ear: External ear normal.     Left Ear: External ear normal.  Eyes:     General: No scleral icterus.       Right eye: No discharge.        Left eye: No discharge.     Conjunctiva/sclera: Conjunctivae normal.  Neck:     Musculoskeletal: Neck supple.     Trachea: No tracheal deviation.  Cardiovascular:     Rate and Rhythm: Normal rate and regular rhythm.  Pulmonary:     Effort: Pulmonary effort is normal. No respiratory distress.     Breath sounds: No stridor. Wheezing present. No rales.  Abdominal:       General: Bowel sounds  are normal. There is no distension.     Palpations: Abdomen is soft.     Tenderness: There is no abdominal tenderness. There is no guarding or rebound.  Musculoskeletal:        General: No tenderness.  Skin:    General: Skin is warm and dry.     Findings: No rash.  Neurological:     Mental Status: She is alert.     Cranial Nerves: No cranial nerve deficit (no facial droop, extraocular movements intact, no slurred speech).     Sensory: No sensory deficit.     Motor: No abnormal muscle tone or seizure activity.     Coordination: Coordination normal.      ED Treatments / Results  Labs (all labs ordered are listed, but only abnormal results are displayed) Labs Reviewed  BASIC METABOLIC PANEL - Abnormal; Notable for the following components:      Result Value   Potassium 3.2 (*)    Chloride 96 (*)    Glucose, Bld 122 (*)    BUN 7 (*)    Creatinine, Ser 1.07 (*)    GFR calc non Af Amer 43 (*)    GFR calc Af Amer 50 (*)    All other components within normal limits  CBC - Abnormal; Notable for the following components:   RBC 3.52 (*)    Hemoglobin 11.5 (*)    MCV 102.8 (*)    All other components within normal limits  BRAIN NATRIURETIC PEPTIDE - Abnormal; Notable for the following components:   B Natriuretic Peptide 174.2 (*)    All other components within normal limits  I-STAT TROPONIN, ED    EKG EKG Interpretation  Date/Time:  Sunday May 25 2018 18:50:15 EST Ventricular Rate:  74 PR Interval:    QRS Duration: 94 QT Interval:  447 QTC Calculation: 496 R Axis:   -31 Text Interpretation:  Sinus or ectopic atrial rhythm Borderline prolonged PR interval Left axis deviation Low voltage, precordial leads Borderline repolarization abnormality Borderline prolonged QT interval No significant change since last tracing Confirmed by Dorie Rank [phone]) on 05/25/2018 6:55:23 PM   Radiology Dg Chest 2 View  Result Date: 05/25/2018 CLINICAL DATA:   Shortness of breath EXAM: CHEST - 2 VIEW COMPARISON:  Chest x-ray dated 03/18/2018. FINDINGS: Heart size and mediastinal contours are stable. LEFT chest wall pacemaker leads appear stable in position. Lungs are clear. No pleural effusion or pneumothorax seen. No acute or suspicious osseous finding. IMPRESSION: No active cardiopulmonary disease. No evidence of pneumonia or pulmonary edema. Electronically Signed   By: Franki Cabot M.D.   On: 05/25/2018 20:15    Procedures Procedures (including critical care time)  Medications Ordered in ED Medications  methylPREDNISolone sodium succinate (SOLU-MEDROL) 125 mg/2 mL injection 80 mg (80 mg Intravenous Given 05/25/18 1938)     Initial Impression / Assessment and Plan / ED Course  I have reviewed the triage vital signs and the nursing notes.  Pertinent labs & imaging results that were available during my care of the patient were reviewed by me and considered in my medical decision making (see chart for details).  Clinical Course as of May 25 2145  Nancy Fetter May 25, 2018  2028 Labs are unremarkable.  Chest x-ray without pneumonia.   [JK]    Clinical Course User Index [JK] Dorie Rank, MD    Patient's laboratory tests are reassuring.  No findings to suggest congestive heart failure exacerbation.  Chest x-ray does not show pulmonary edema  or pneumonia.  Patient was treated with albuterol and steroids.  His symptoms improved.  I discussed the findings with the patient and her daughter.  She does have breathing treatments that she is able to use at home.  She has been using her diuretics as indicated for her peripheral edema.  I suspect patient symptoms were triggered by a viral infection.  She does have a component of bronchospasm.  She has improved with treatment appears stable for discharge.  I will give her a prescription for a steroid taper and Tessalon for cough.  I encouraged her to follow-up with her pulmonary doctor.  Final Clinical Impressions(s) /  ED Diagnoses   Final diagnoses:  Bronchitis    ED Discharge Orders         Ordered    predniSONE (STERAPRED UNI-PAK 21 TAB) 10 MG (21) TBPK tablet     05/25/18 2147    benzonatate (TESSALON) 100 MG capsule  Every 8 hours     05/25/18 2147           Dorie Rank, MD 05/25/18 2149

## 2018-05-25 NOTE — ED Triage Notes (Signed)
Per [facility]- pt picked up from home, reports SOBx2 months, and pneumonia around christmas. Pt's condition has hasn't improved since then.  2 duo nebs treatments given PTA by EMS

## 2018-05-25 NOTE — Discharge Instructions (Addendum)
Ache the medications as prescribed, follow-up with your pulmonary doctor this week to be rechecked

## 2018-06-07 DIAGNOSIS — I5032 Chronic diastolic (congestive) heart failure: Secondary | ICD-10-CM | POA: Diagnosis not present

## 2018-06-07 DIAGNOSIS — I480 Paroxysmal atrial fibrillation: Secondary | ICD-10-CM | POA: Diagnosis not present

## 2018-06-09 ENCOUNTER — Ambulatory Visit (INDEPENDENT_AMBULATORY_CARE_PROVIDER_SITE_OTHER): Payer: PPO | Admitting: *Deleted

## 2018-06-09 DIAGNOSIS — I495 Sick sinus syndrome: Secondary | ICD-10-CM | POA: Diagnosis not present

## 2018-06-09 DIAGNOSIS — R001 Bradycardia, unspecified: Secondary | ICD-10-CM

## 2018-06-09 LAB — CUP PACEART REMOTE DEVICE CHECK
Battery Remaining Percentage: 95.5 %
Battery Voltage: 2.99 V
Brady Statistic AP VP Percent: 1 %
Brady Statistic AP VS Percent: 53 %
Brady Statistic AS VP Percent: 1 %
Brady Statistic RA Percent Paced: 53 %
Brady Statistic RV Percent Paced: 1 %
Date Time Interrogation Session: 20200302103811
Implantable Lead Implant Date: 20170207
Implantable Lead Location: 753859
Implantable Pulse Generator Implant Date: 20170207
Lead Channel Impedance Value: 380 Ohm
Lead Channel Impedance Value: 440 Ohm
Lead Channel Pacing Threshold Amplitude: 0.75 V
Lead Channel Pacing Threshold Amplitude: 1.25 V
Lead Channel Pacing Threshold Pulse Width: 0.5 ms
Lead Channel Pacing Threshold Pulse Width: 0.6 ms
Lead Channel Sensing Intrinsic Amplitude: 2.4 mV
Lead Channel Sensing Intrinsic Amplitude: 5 mV
Lead Channel Setting Pacing Amplitude: 2.5 V
Lead Channel Setting Pacing Amplitude: 2.5 V
Lead Channel Setting Pacing Pulse Width: 0.5 ms
Lead Channel Setting Sensing Sensitivity: 2 mV
MDC IDC LEAD IMPLANT DT: 20170207
MDC IDC LEAD LOCATION: 753860
MDC IDC MSMT BATTERY REMAINING LONGEVITY: 109 mo
MDC IDC STAT BRADY AS VS PERCENT: 47 %
Pulse Gen Model: 2240
Pulse Gen Serial Number: 7866856

## 2018-06-16 NOTE — Progress Notes (Signed)
Remote pacemaker transmission.   

## 2018-06-18 DIAGNOSIS — Z961 Presence of intraocular lens: Secondary | ICD-10-CM | POA: Diagnosis not present

## 2018-06-18 DIAGNOSIS — H02132 Senile ectropion of right lower eyelid: Secondary | ICD-10-CM | POA: Diagnosis not present

## 2018-06-19 ENCOUNTER — Ambulatory Visit: Payer: PPO | Admitting: Interventional Cardiology

## 2018-07-01 DIAGNOSIS — I5032 Chronic diastolic (congestive) heart failure: Secondary | ICD-10-CM | POA: Diagnosis not present

## 2018-07-01 DIAGNOSIS — I480 Paroxysmal atrial fibrillation: Secondary | ICD-10-CM | POA: Diagnosis not present

## 2018-07-03 DIAGNOSIS — R7309 Other abnormal glucose: Secondary | ICD-10-CM | POA: Diagnosis not present

## 2018-07-03 DIAGNOSIS — I700 Atherosclerosis of aorta: Secondary | ICD-10-CM | POA: Diagnosis not present

## 2018-07-03 DIAGNOSIS — I2510 Atherosclerotic heart disease of native coronary artery without angina pectoris: Secondary | ICD-10-CM | POA: Diagnosis not present

## 2018-07-03 DIAGNOSIS — I5032 Chronic diastolic (congestive) heart failure: Secondary | ICD-10-CM | POA: Diagnosis not present

## 2018-07-03 DIAGNOSIS — J42 Unspecified chronic bronchitis: Secondary | ICD-10-CM | POA: Diagnosis not present

## 2018-07-03 DIAGNOSIS — E039 Hypothyroidism, unspecified: Secondary | ICD-10-CM | POA: Diagnosis not present

## 2018-07-03 DIAGNOSIS — J849 Interstitial pulmonary disease, unspecified: Secondary | ICD-10-CM | POA: Diagnosis not present

## 2018-07-03 DIAGNOSIS — F322 Major depressive disorder, single episode, severe without psychotic features: Secondary | ICD-10-CM | POA: Diagnosis not present

## 2018-07-03 DIAGNOSIS — E7800 Pure hypercholesterolemia, unspecified: Secondary | ICD-10-CM | POA: Diagnosis not present

## 2018-07-03 DIAGNOSIS — N183 Chronic kidney disease, stage 3 (moderate): Secondary | ICD-10-CM | POA: Diagnosis not present

## 2018-08-04 DIAGNOSIS — N183 Chronic kidney disease, stage 3 (moderate): Secondary | ICD-10-CM | POA: Diagnosis not present

## 2018-08-04 DIAGNOSIS — E7800 Pure hypercholesterolemia, unspecified: Secondary | ICD-10-CM | POA: Diagnosis not present

## 2018-08-04 DIAGNOSIS — E039 Hypothyroidism, unspecified: Secondary | ICD-10-CM | POA: Diagnosis not present

## 2018-08-04 DIAGNOSIS — R7309 Other abnormal glucose: Secondary | ICD-10-CM | POA: Diagnosis not present

## 2018-08-07 DIAGNOSIS — I480 Paroxysmal atrial fibrillation: Secondary | ICD-10-CM | POA: Diagnosis not present

## 2018-08-07 DIAGNOSIS — I5032 Chronic diastolic (congestive) heart failure: Secondary | ICD-10-CM | POA: Diagnosis not present

## 2018-09-07 DIAGNOSIS — I5032 Chronic diastolic (congestive) heart failure: Secondary | ICD-10-CM | POA: Diagnosis not present

## 2018-09-07 DIAGNOSIS — I480 Paroxysmal atrial fibrillation: Secondary | ICD-10-CM | POA: Diagnosis not present

## 2018-09-08 ENCOUNTER — Ambulatory Visit (INDEPENDENT_AMBULATORY_CARE_PROVIDER_SITE_OTHER): Payer: PPO | Admitting: *Deleted

## 2018-09-08 DIAGNOSIS — I495 Sick sinus syndrome: Secondary | ICD-10-CM

## 2018-09-09 LAB — CUP PACEART REMOTE DEVICE CHECK
Date Time Interrogation Session: 20200602085256
Implantable Lead Implant Date: 20170207
Implantable Lead Implant Date: 20170207
Implantable Lead Location: 753859
Implantable Lead Location: 753860
Implantable Pulse Generator Implant Date: 20170207
Pulse Gen Model: 2240
Pulse Gen Serial Number: 7866856

## 2018-09-16 ENCOUNTER — Encounter: Payer: Self-pay | Admitting: Cardiology

## 2018-09-16 NOTE — Progress Notes (Signed)
Remote pacemaker transmission.   

## 2018-10-07 DIAGNOSIS — H04521 Eversion of right lacrimal punctum: Secondary | ICD-10-CM | POA: Diagnosis not present

## 2018-10-07 DIAGNOSIS — H04123 Dry eye syndrome of bilateral lacrimal glands: Secondary | ICD-10-CM | POA: Diagnosis not present

## 2018-10-07 DIAGNOSIS — D485 Neoplasm of uncertain behavior of skin: Secondary | ICD-10-CM | POA: Diagnosis not present

## 2018-10-07 DIAGNOSIS — I480 Paroxysmal atrial fibrillation: Secondary | ICD-10-CM | POA: Diagnosis not present

## 2018-10-07 DIAGNOSIS — H02132 Senile ectropion of right lower eyelid: Secondary | ICD-10-CM | POA: Diagnosis not present

## 2018-10-07 DIAGNOSIS — H02532 Eyelid retraction right lower eyelid: Secondary | ICD-10-CM | POA: Diagnosis not present

## 2018-10-07 DIAGNOSIS — H02112 Cicatricial ectropion of right lower eyelid: Secondary | ICD-10-CM | POA: Diagnosis not present

## 2018-10-07 DIAGNOSIS — I5032 Chronic diastolic (congestive) heart failure: Secondary | ICD-10-CM | POA: Diagnosis not present

## 2018-10-07 DIAGNOSIS — H02142 Spastic ectropion of right lower eyelid: Secondary | ICD-10-CM | POA: Diagnosis not present

## 2018-10-07 DIAGNOSIS — H16211 Exposure keratoconjunctivitis, right eye: Secondary | ICD-10-CM | POA: Diagnosis not present

## 2018-10-30 ENCOUNTER — Telehealth: Payer: Self-pay | Admitting: *Deleted

## 2018-10-30 DIAGNOSIS — J42 Unspecified chronic bronchitis: Secondary | ICD-10-CM | POA: Diagnosis not present

## 2018-10-30 DIAGNOSIS — F324 Major depressive disorder, single episode, in partial remission: Secondary | ICD-10-CM | POA: Diagnosis not present

## 2018-10-30 DIAGNOSIS — E039 Hypothyroidism, unspecified: Secondary | ICD-10-CM | POA: Diagnosis not present

## 2018-10-30 DIAGNOSIS — I5032 Chronic diastolic (congestive) heart failure: Secondary | ICD-10-CM | POA: Diagnosis not present

## 2018-10-30 DIAGNOSIS — I2510 Atherosclerotic heart disease of native coronary artery without angina pectoris: Secondary | ICD-10-CM | POA: Diagnosis not present

## 2018-10-30 DIAGNOSIS — J411 Mucopurulent chronic bronchitis: Secondary | ICD-10-CM | POA: Diagnosis not present

## 2018-10-30 DIAGNOSIS — I509 Heart failure, unspecified: Secondary | ICD-10-CM | POA: Diagnosis not present

## 2018-10-30 DIAGNOSIS — F0390 Unspecified dementia without behavioral disturbance: Secondary | ICD-10-CM | POA: Diagnosis not present

## 2018-10-30 DIAGNOSIS — I480 Paroxysmal atrial fibrillation: Secondary | ICD-10-CM | POA: Diagnosis not present

## 2018-10-30 DIAGNOSIS — N183 Chronic kidney disease, stage 3 (moderate): Secondary | ICD-10-CM | POA: Diagnosis not present

## 2018-10-30 DIAGNOSIS — I10 Essential (primary) hypertension: Secondary | ICD-10-CM | POA: Diagnosis not present

## 2018-10-30 NOTE — Telephone Encounter (Signed)
   [facility] Pre-operative Risk Assessment    Request for surgical clearance:  1. What type of surgery is being performed? RIGHT EYELID ECTROPION REPAIR and LEFT LOWER EYELID LESION EXCISION   2. When is this surgery scheduled? 11/18/18  3. What type of clearance is required (medical clearance vs. Pharmacy clearance to hold med vs. Both)? MEDICAL  4. Are there any medications that need to be held prior to surgery and how long? PLAVIX; IT HAS BEEN NOTED THAT PT HAS A PACEMAKER.   5. Practice name and name of physician performing surgery? [facility]; DR. Elayne Snare ZALDIVAR  6. What is your office phone number [phone]   7.   What is your office fax number [phone]  8.   Anesthesia type (None, local, MAC, general) ? MAC   Julaine Hua 10/30/2018, 4:23 PM  _________________________________________________________________   (provider comments below)

## 2018-10-31 NOTE — Telephone Encounter (Signed)
   Primary Cardiologist: Larae Grooms, MD  Primary Electrophysiologist: Cristopher Peru, MD  Chart reviewed as part of pre-operative protocol coverage. Patient was contacted 10/31/2018 in reference to pre-operative risk assessment for pending surgery as outlined below.  Sophonie Goforth Roussin was last seen on 05/13/2018 by Dr Lovena Le.  Since that day, MARIFER HURD has done well.  Therefore, based on ACC/AHA guidelines, the patient would be at acceptable risk for the planned procedure without further cardiovascular testing.   I will route this recommendation to the requesting party via Epic fax function and remove from pre-op pool.  Please call with questions.  Rosaria Ferries, PA-C 10/31/2018, 3:38 PM    [facility]; DR. Isidoro Donning office phone number [phone]  office fax number [phone]

## 2018-11-07 DIAGNOSIS — I480 Paroxysmal atrial fibrillation: Secondary | ICD-10-CM | POA: Diagnosis not present

## 2018-11-07 DIAGNOSIS — I5032 Chronic diastolic (congestive) heart failure: Secondary | ICD-10-CM | POA: Diagnosis not present

## 2018-11-10 NOTE — Telephone Encounter (Signed)
OK to hold Plavix 5 days prior to procedure.

## 2018-11-10 NOTE — Telephone Encounter (Signed)
Message sent to Dr. Irish Lack

## 2018-11-10 NOTE — Telephone Encounter (Signed)
Follow up   Laura Zuniga is needing to know if the patient's plavix should be withheld. Please advise.

## 2018-11-12 DIAGNOSIS — R1084 Generalized abdominal pain: Secondary | ICD-10-CM | POA: Diagnosis not present

## 2018-11-12 DIAGNOSIS — R197 Diarrhea, unspecified: Secondary | ICD-10-CM | POA: Diagnosis not present

## 2018-11-17 ENCOUNTER — Telehealth: Payer: Self-pay | Admitting: Interventional Cardiology

## 2018-11-17 NOTE — Telephone Encounter (Signed)
° °  Daughter of patient called. The patient was supposed to have a surgery done to correct a droopy eye tomorrow, 11/18/18.  The surgical center cancelled the procedure because they thought the patient was too high risk to do the procedure.  The daughter just wants to know if Dr. Irish Lack feels that the patient is too high risk to have the procedure done. The daughter is concerned with the way the patient's eye is looking, and really does not want the patient to get worse.  Please advise the daughter.

## 2018-11-17 NOTE — Telephone Encounter (Signed)
No cardiac testing needed.  It is up to the surgeon where to do the surgery.

## 2018-11-17 NOTE — Telephone Encounter (Signed)
Spoke with patient's daughter. She states that the patient was suppose to have ectropion repair of her right eyelid and a lesion excised from her left lower eyelid with Dr. Isidoro Donning at [facility] tomorrow. She states that they cancelled her procedure and told her she was "too high risk" to do there and that they would look at finding a place for the patient to have it done somewhere else. Daughter is requesting Dr. Hassell Done opinion on whether or not the patient is too high risk. Made patient aware that Dr. Irish Lack cleared her from a cardiac perspective and that it would be at the discretion of the surgeon if he felt that it would be "too high risk" to do it outside of the hospital. Will forward to make aware.

## 2018-11-17 NOTE — Telephone Encounter (Signed)
Left message to call back  

## 2018-11-18 NOTE — Telephone Encounter (Signed)
Spoke to pt daughter Hassan Rowan. Informed of Dr. Hassell Done recommendation and informed I will re-fax clearance. Pt daughter stated they are doing the procedure in the office now with mild sedative and local anesthetic. Pt daughter verbalized thanks for the call.  Clearance letter re-faxed via Epic fax function.

## 2018-11-18 NOTE — Telephone Encounter (Signed)
Please let them know Dr. Irish Lack does not recommend further cardiac testing and surgery is at the discretion of the surgeon.   Please re-fax clearance letter dated 10/31/18 from [facility]

## 2018-11-26 DIAGNOSIS — N183 Chronic kidney disease, stage 3 (moderate): Secondary | ICD-10-CM | POA: Diagnosis not present

## 2018-11-26 DIAGNOSIS — E039 Hypothyroidism, unspecified: Secondary | ICD-10-CM | POA: Diagnosis not present

## 2018-11-26 DIAGNOSIS — F324 Major depressive disorder, single episode, in partial remission: Secondary | ICD-10-CM | POA: Diagnosis not present

## 2018-11-26 DIAGNOSIS — F322 Major depressive disorder, single episode, severe without psychotic features: Secondary | ICD-10-CM | POA: Diagnosis not present

## 2018-11-26 DIAGNOSIS — J42 Unspecified chronic bronchitis: Secondary | ICD-10-CM | POA: Diagnosis not present

## 2018-11-26 DIAGNOSIS — J411 Mucopurulent chronic bronchitis: Secondary | ICD-10-CM | POA: Diagnosis not present

## 2018-11-26 DIAGNOSIS — I5032 Chronic diastolic (congestive) heart failure: Secondary | ICD-10-CM | POA: Diagnosis not present

## 2018-11-26 DIAGNOSIS — I10 Essential (primary) hypertension: Secondary | ICD-10-CM | POA: Diagnosis not present

## 2018-11-26 DIAGNOSIS — I480 Paroxysmal atrial fibrillation: Secondary | ICD-10-CM | POA: Diagnosis not present

## 2018-11-26 DIAGNOSIS — I2510 Atherosclerotic heart disease of native coronary artery without angina pectoris: Secondary | ICD-10-CM | POA: Diagnosis not present

## 2018-11-26 DIAGNOSIS — F0390 Unspecified dementia without behavioral disturbance: Secondary | ICD-10-CM | POA: Diagnosis not present

## 2018-11-26 DIAGNOSIS — I509 Heart failure, unspecified: Secondary | ICD-10-CM | POA: Diagnosis not present

## 2018-11-27 DIAGNOSIS — R410 Disorientation, unspecified: Secondary | ICD-10-CM | POA: Diagnosis not present

## 2018-11-27 DIAGNOSIS — K589 Irritable bowel syndrome without diarrhea: Secondary | ICD-10-CM | POA: Diagnosis not present

## 2018-11-27 DIAGNOSIS — K5900 Constipation, unspecified: Secondary | ICD-10-CM | POA: Diagnosis not present

## 2018-12-07 ENCOUNTER — Other Ambulatory Visit: Payer: Self-pay | Admitting: Interventional Cardiology

## 2018-12-08 ENCOUNTER — Ambulatory Visit
Admission: RE | Admit: 2018-12-08 | Discharge: 2018-12-08 | Disposition: A | Discharge status: Home / Self Care | Payer: PPO | Source: Ambulatory Visit | Attending: Internal Medicine | Admitting: Internal Medicine

## 2018-12-08 ENCOUNTER — Other Ambulatory Visit: Payer: Self-pay | Admitting: Internal Medicine

## 2018-12-08 ENCOUNTER — Ambulatory Visit (INDEPENDENT_AMBULATORY_CARE_PROVIDER_SITE_OTHER): Payer: PPO | Admitting: *Deleted

## 2018-12-08 DIAGNOSIS — K5900 Constipation, unspecified: Secondary | ICD-10-CM

## 2018-12-08 DIAGNOSIS — I480 Paroxysmal atrial fibrillation: Secondary | ICD-10-CM | POA: Diagnosis not present

## 2018-12-08 DIAGNOSIS — R109 Unspecified abdominal pain: Secondary | ICD-10-CM | POA: Diagnosis not present

## 2018-12-08 DIAGNOSIS — I5032 Chronic diastolic (congestive) heart failure: Secondary | ICD-10-CM | POA: Diagnosis not present

## 2018-12-08 DIAGNOSIS — I495 Sick sinus syndrome: Secondary | ICD-10-CM | POA: Diagnosis not present

## 2018-12-08 LAB — CUP PACEART REMOTE DEVICE CHECK
Battery Remaining Longevity: 104 mo
Battery Remaining Percentage: 95.5 %
Battery Voltage: 2.98 V
Brady Statistic AP VP Percent: 1 %
Brady Statistic AP VS Percent: 80 %
Brady Statistic AS VP Percent: 1 %
Brady Statistic AS VS Percent: 20 %
Brady Statistic RA Percent Paced: 79 %
Brady Statistic RV Percent Paced: 1 %
Date Time Interrogation Session: 20200831065309
Implantable Lead Implant Date: 20170207
Implantable Lead Implant Date: 20170207
Implantable Lead Location: 753859
Implantable Lead Location: 753860
Implantable Pulse Generator Implant Date: 20170207
Lead Channel Impedance Value: 380 Ohm
Lead Channel Impedance Value: 440 Ohm
Lead Channel Pacing Threshold Amplitude: 0.75 V
Lead Channel Pacing Threshold Amplitude: 1.25 V
Lead Channel Pacing Threshold Pulse Width: 0.5 ms
Lead Channel Pacing Threshold Pulse Width: 0.6 ms
Lead Channel Sensing Intrinsic Amplitude: 2.7 mV
Lead Channel Sensing Intrinsic Amplitude: 5.7 mV
Lead Channel Setting Pacing Amplitude: 2.5 V
Lead Channel Setting Pacing Amplitude: 2.5 V
Lead Channel Setting Pacing Pulse Width: 0.5 ms
Lead Channel Setting Sensing Sensitivity: 2 mV
Pulse Gen Model: 2240
Pulse Gen Serial Number: 7866856

## 2018-12-17 ENCOUNTER — Encounter: Payer: Self-pay | Admitting: Cardiology

## 2018-12-17 DIAGNOSIS — H5010 Unspecified exotropia: Secondary | ICD-10-CM | POA: Diagnosis not present

## 2018-12-17 DIAGNOSIS — R531 Weakness: Secondary | ICD-10-CM | POA: Diagnosis not present

## 2018-12-17 DIAGNOSIS — R21 Rash and other nonspecific skin eruption: Secondary | ICD-10-CM | POA: Diagnosis not present

## 2018-12-17 DIAGNOSIS — R079 Chest pain, unspecified: Secondary | ICD-10-CM | POA: Diagnosis not present

## 2018-12-17 DIAGNOSIS — K5900 Constipation, unspecified: Secondary | ICD-10-CM | POA: Diagnosis not present

## 2018-12-17 NOTE — Progress Notes (Signed)
Remote pacemaker transmission.   

## 2018-12-19 DIAGNOSIS — A46 Erysipelas: Secondary | ICD-10-CM | POA: Diagnosis not present

## 2018-12-26 DIAGNOSIS — A46 Erysipelas: Secondary | ICD-10-CM | POA: Diagnosis not present

## 2019-01-07 DIAGNOSIS — I5032 Chronic diastolic (congestive) heart failure: Secondary | ICD-10-CM | POA: Diagnosis not present

## 2019-01-07 DIAGNOSIS — I480 Paroxysmal atrial fibrillation: Secondary | ICD-10-CM | POA: Diagnosis not present

## 2019-02-07 DIAGNOSIS — I480 Paroxysmal atrial fibrillation: Secondary | ICD-10-CM | POA: Diagnosis not present

## 2019-02-07 DIAGNOSIS — I5032 Chronic diastolic (congestive) heart failure: Secondary | ICD-10-CM | POA: Diagnosis not present

## 2019-02-10 DIAGNOSIS — H02132 Senile ectropion of right lower eyelid: Secondary | ICD-10-CM | POA: Diagnosis not present

## 2019-02-10 DIAGNOSIS — H02112 Cicatricial ectropion of right lower eyelid: Secondary | ICD-10-CM | POA: Diagnosis not present

## 2019-02-10 DIAGNOSIS — H04521 Eversion of right lacrimal punctum: Secondary | ICD-10-CM | POA: Diagnosis not present

## 2019-02-13 ENCOUNTER — Telehealth: Payer: Self-pay | Admitting: Emergency Medicine

## 2019-02-13 NOTE — Telephone Encounter (Signed)
Called the number provided, was told that all agents were busy. Left a message for Levada Dy to call back. Patient was last seen in our office in January 2020.

## 2019-02-16 NOTE — Telephone Encounter (Signed)
We do not have any OV notes in the last 6 months. I have sent a community message to Levada Dy making her aware of this.

## 2019-03-01 ENCOUNTER — Other Ambulatory Visit: Payer: Self-pay | Admitting: Cardiology

## 2019-03-01 DIAGNOSIS — Z20822 Contact with and (suspected) exposure to covid-19: Secondary | ICD-10-CM

## 2019-03-02 LAB — NOVEL CORONAVIRUS, NAA: SARS-CoV-2, NAA: NOT DETECTED

## 2019-03-04 DIAGNOSIS — F322 Major depressive disorder, single episode, severe without psychotic features: Secondary | ICD-10-CM | POA: Diagnosis not present

## 2019-03-04 DIAGNOSIS — I10 Essential (primary) hypertension: Secondary | ICD-10-CM | POA: Diagnosis not present

## 2019-03-04 DIAGNOSIS — F0390 Unspecified dementia without behavioral disturbance: Secondary | ICD-10-CM | POA: Diagnosis not present

## 2019-03-04 DIAGNOSIS — E039 Hypothyroidism, unspecified: Secondary | ICD-10-CM | POA: Diagnosis not present

## 2019-03-04 DIAGNOSIS — F324 Major depressive disorder, single episode, in partial remission: Secondary | ICD-10-CM | POA: Diagnosis not present

## 2019-03-04 DIAGNOSIS — I5032 Chronic diastolic (congestive) heart failure: Secondary | ICD-10-CM | POA: Diagnosis not present

## 2019-03-04 DIAGNOSIS — I480 Paroxysmal atrial fibrillation: Secondary | ICD-10-CM | POA: Diagnosis not present

## 2019-03-04 DIAGNOSIS — I509 Heart failure, unspecified: Secondary | ICD-10-CM | POA: Diagnosis not present

## 2019-03-04 DIAGNOSIS — N1831 Chronic kidney disease, stage 3a: Secondary | ICD-10-CM | POA: Diagnosis not present

## 2019-03-04 DIAGNOSIS — J411 Mucopurulent chronic bronchitis: Secondary | ICD-10-CM | POA: Diagnosis not present

## 2019-03-04 DIAGNOSIS — I2510 Atherosclerotic heart disease of native coronary artery without angina pectoris: Secondary | ICD-10-CM | POA: Diagnosis not present

## 2019-03-04 DIAGNOSIS — J42 Unspecified chronic bronchitis: Secondary | ICD-10-CM | POA: Diagnosis not present

## 2019-03-09 ENCOUNTER — Ambulatory Visit (INDEPENDENT_AMBULATORY_CARE_PROVIDER_SITE_OTHER): Payer: PPO | Admitting: *Deleted

## 2019-03-09 DIAGNOSIS — I480 Paroxysmal atrial fibrillation: Secondary | ICD-10-CM | POA: Diagnosis not present

## 2019-03-09 DIAGNOSIS — I5032 Chronic diastolic (congestive) heart failure: Secondary | ICD-10-CM | POA: Diagnosis not present

## 2019-03-09 DIAGNOSIS — Z950 Presence of cardiac pacemaker: Secondary | ICD-10-CM

## 2019-03-09 LAB — CUP PACEART REMOTE DEVICE CHECK
Battery Remaining Longevity: 104 mo
Battery Remaining Percentage: 95.5 %
Battery Voltage: 2.99 V
Brady Statistic AP VP Percent: 1 %
Brady Statistic AP VS Percent: 81 %
Brady Statistic AS VP Percent: 1 %
Brady Statistic AS VS Percent: 18 %
Brady Statistic RA Percent Paced: 81 %
Brady Statistic RV Percent Paced: 1 %
Date Time Interrogation Session: 20201130020028
Implantable Lead Implant Date: 20170207
Implantable Lead Implant Date: 20170207
Implantable Lead Location: 753859
Implantable Lead Location: 753860
Implantable Pulse Generator Implant Date: 20170207
Lead Channel Impedance Value: 380 Ohm
Lead Channel Impedance Value: 440 Ohm
Lead Channel Pacing Threshold Amplitude: 0.75 V
Lead Channel Pacing Threshold Amplitude: 1.25 V
Lead Channel Pacing Threshold Pulse Width: 0.5 ms
Lead Channel Pacing Threshold Pulse Width: 0.6 ms
Lead Channel Sensing Intrinsic Amplitude: 2.7 mV
Lead Channel Sensing Intrinsic Amplitude: 5 mV
Lead Channel Setting Pacing Amplitude: 2.5 V
Lead Channel Setting Pacing Amplitude: 2.5 V
Lead Channel Setting Pacing Pulse Width: 0.5 ms
Lead Channel Setting Sensing Sensitivity: 2 mV
Pulse Gen Model: 2240
Pulse Gen Serial Number: 7866856

## 2019-03-19 ENCOUNTER — Ambulatory Visit (INDEPENDENT_AMBULATORY_CARE_PROVIDER_SITE_OTHER): Payer: PPO | Admitting: Emergency Medicine

## 2019-03-19 ENCOUNTER — Encounter: Payer: Self-pay | Admitting: Emergency Medicine

## 2019-03-19 ENCOUNTER — Other Ambulatory Visit: Payer: Self-pay

## 2019-03-19 DIAGNOSIS — J449 Chronic obstructive pulmonary disease, unspecified: Secondary | ICD-10-CM | POA: Diagnosis not present

## 2019-03-19 DIAGNOSIS — J849 Interstitial pulmonary disease, unspecified: Secondary | ICD-10-CM

## 2019-03-19 NOTE — Progress Notes (Signed)
Subjective:    Patient ID: RASHON MAGYAR, female    DOB: September 13, 1917, 85 y.o.   MRN: [id number]  Shortness of Breath Associated symptoms include wheezing. Pertinent negatives include no ear pain, fever, headaches, leg swelling, rash, rhinorrhea, sore throat or vomiting.   ROV 03/19/2019 --Mrs. Bartnik is 57, a never smoker, with a history of CAD, hypertension with diastolic CHF, atrial fibrillation (pacer).  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  Amiodarone has been stopped she also has GERD and intermittent aspiration symptoms that could be contributors.  Her interstitial disease was stable on CT chest 03/18/2018.  She may have some mild COPD but her pulmonary function testing from 2017 was grossly normal, possibly some curve to her flow volume loop.  She has intermittent upper airway wheeze. She does a lot of throat clearing. Intermittently aspirates with meals. She walked today and did not desaturate. Never uses her albuterol.    Review of Systems  Constitutional: Negative for fever and unexpected weight change.  HENT: Negative for congestion, dental problem, ear pain, nosebleeds, postnasal drip, rhinorrhea, sinus pressure, sneezing, sore throat and trouble swallowing.   Eyes: Negative for redness and itching.  Respiratory: Positive for cough, shortness of breath and wheezing. Negative for chest tightness.   Cardiovascular: Negative for palpitations and leg swelling.  Gastrointestinal: Negative for nausea and vomiting.  Genitourinary: Negative for dysuria.  Musculoskeletal: Negative for joint swelling.  Skin: Negative for rash.  Neurological: Negative for headaches.  Hematological: Does not bruise/bleed easily.  Psychiatric/Behavioral: Negative for dysphoric mood. The patient is not nervous/anxious.     Past Medical History:  Diagnosis Date  . Bradycardia    a. Amiodarone DC'd 12/16 >> b. Holter 1/17 - NSR, sinus  brady, junctional rhythm, pause 2.4 s  . C. difficile colitis 2015  . CAD (coronary artery disease)    never had cardiac cath, CAD dx by positive ant. wall defect on Nuc. study 2007 >>  no cath due to high risk/advanced age  . Chronic diastolic CHF (congestive heart failure) ([place])    a. Echo 6/15 - Mild LVH, EF 55-60%, mild AI, mild MR, moderate LAE, mild RAE, PASP 52 mm Hg  . CKD (chronic kidney disease)   . COPD (chronic obstructive pulmonary disease) ([place])   . Diverticulitis   . GERD (gastroesophageal reflux disease)   . Hypertension   . Macular degeneration   . PAF (paroxysmal atrial fibrillation) ([facility])    not optimal candidate for anticoagulation     Family History  Problem Relation Age of Onset  . Heart attack Father 41  . Heart attack Sister   . Cancer Brother   . Cancer Brother   . Cirrhosis Brother   . Heart disease Brother   . Cancer Brother   . COPD Brother   . COPD Sister   . Cancer Sister   . Cancer Sister   . Hypertension Daughter   . Stroke Neg Hx      Social History   Socioeconomic History  . Marital status: Widowed    Spouse name: Not on file  . Number of children: Not on file  . Years of education: Not on file  . Highest education level: Not on file  Occupational History  . Not on file  Tobacco Use  . Smoking status: Never Smoker  . Smokeless tobacco: Never Used  Substance and Sexual Activity  . Alcohol use: No  Alcohol/week: 0.0 standard drinks  . Drug use: No  . Sexual activity: Never  Other Topics Concern  . Not on file  Social History Narrative        father died age 51: [place] MI         mother died age 73: gallbladder surgery complications         4 brothers: all deceased; prostate ca, throat ca, emphesema, MI, cirrhosis from ETOH         3 sisters: one living: MI, emphesema, 2 sisters with breast CA   Social Determinants of Health   Financial Resource Strain:   . Difficulty of Paying Living Expenses: Not on file  Food  Insecurity:   . Worried About Charity fundraiser in the Last Year: Not on file  . Ran Out of Food in the Last Year: Not on file  Transportation Needs:   . Lack of Transportation (Medical): Not on file  . Lack of Transportation (Non-Medical): Not on file  Physical Activity:   . Days of Exercise per Week: Not on file  . Minutes of Exercise per Session: Not on file  Stress:   . Feeling of Stress : Not on file  Social Connections:   . Frequency of Communication with Friends and Family: Not on file  . Frequency of Social Gatherings with Friends and Family: Not on file  . Attends Religious Services: Not on file  . Active Member of Clubs or Organizations: Not on file  . Attends Archivist Meetings: Not on file  . Marital Status: Not on file  Intimate Partner Violence:   . Fear of Current or Ex-Partner: Not on file  . Emotionally Abused: Not on file  . Physically Abused: Not on file  . Sexually Abused: Not on file     Allergies  Allergen Reactions  . Penicillins Other (See Comments)    Unknown allergic reaction Has patient had a PCN reaction causing immediate rash, facial/tongue/throat swelling, SOB or lightheadedness with hypotension: unknown Has patient had a PCN reaction causing severe rash involving mucus membranes or skin necrosis: unknown Has patient had a PCN reaction that required hospitalization: unknown Has patient had a PCN reaction occurring within the last 10 years: unknown If all of the above answers are "NO", then may proceed with Cephalosporin use.   . 2,4-D Dimethylamine (Amisol) Other (See Comments)    Unknown reaction  . Ace Inhibitors Other (See Comments)    Unknown reaction  . Lanolin Hives  . Sulfa Antibiotics Other (See Comments)    Unknown reaction - maybe hives or nausea  . Vancomycin Other (See Comments)    Red man syndrome  . Sulfonamide Derivatives Swelling and Rash     Outpatient Medications Prior to Visit  Medication Sig Dispense Refill   . acetaminophen (TYLENOL) 500 MG tablet Take 2 tablets (1,000 mg total) by mouth every 6 (six) hours as needed for mild pain or moderate pain. 30 tablet 0  . albuterol (PROVENTIL HFA;VENTOLIN HFA) 108 (90 BASE) MCG/ACT inhaler Inhale 2 puffs into the lungs every 6 (six) hours as needed for wheezing or shortness of breath. 1 Inhaler 3  . albuterol (PROVENTIL) (2.5 MG/3ML) 0.083% nebulizer solution Take 2.5 mg by nebulization every 4 (four) hours as needed for wheezing or shortness of breath.     . Ascorbic Acid (VITAMIN C) 1000 MG tablet Take 1,000 mg by mouth daily after lunch.    . benzonatate (TESSALON) 100 MG capsule Take 1 capsule (  100 mg total) by mouth every 8 (eight) hours. 21 capsule 0  . Biotin 1 MG CAPS Take 1 mg by mouth daily after lunch.     . Calcium Carb-Cholecalciferol (CALCIUM 600-D PO) Take 1 tablet by mouth at bedtime.    . Carboxymethylcellulose Sodium (EYE DROPS OP) Place 1 drop into both eyes at bedtime as needed (dry eyes).     . clopidogrel (PLAVIX) 75 MG tablet TAKE 1 TABLET BY MOUTH ONCE DAILY. (Patient taking differently: Take 75 mg by mouth daily. ) 90 tablet 3  . Cranberry 250 MG CAPS Take 250 mg by mouth daily after lunch.     . docusate sodium (COLACE) 100 MG capsule Take 100-200 mg by mouth at bedtime as needed for mild constipation.     Marland Kitchen escitalopram (LEXAPRO) 10 MG tablet Take 10 mg by mouth daily.     . ferrous sulfate 325 (65 FE) MG tablet Take 325 mg by mouth daily after lunch.    . furosemide (LASIX) 80 MG tablet Take 40-80 mg by mouth See admin instructions. Take one tablet (80 mg) by mouth every morning and 1/2 tablet (40 mg) every afternoon, may increase afternoon dose to 1 tablet (80 mg) as needed for fluid or weight gain    . hydrALAZINE (APRESOLINE) 25 MG tablet Take 1 tablet (25 mg total) by mouth 3 (three) times daily. 180 tablet 0  . isosorbide mononitrate (IMDUR) 60 MG 24 hr tablet Take 1 tablet (60 mg total) by mouth daily. Please make yearly appt  with Dr. Irish Lack for December for future refills. 1st attempt 30 tablet 3  . lansoprazole (PREVACID) 30 MG capsule Take 30 mg by mouth daily at 12 noon.    Marland Kitchen levothyroxine (SYNTHROID, LEVOTHROID) 50 MCG tablet Take 50 mcg by mouth daily before breakfast.     . Multiple Vitamins-Minerals (ICAPS AREDS 2 PO) Take 1 tablet by mouth daily after lunch.     . nitroGLYCERIN (NITROSTAT) 0.4 MG SL tablet Place 0.4 mg under the tongue every 5 (five) minutes as needed for chest pain.    . potassium chloride SA (K-DUR,KLOR-CON) 20 MEQ tablet Take 20 mEq by mouth See admin instructions. Take one tablet (20 meq) by mouth twice daily - morning and afternoon    . pravastatin (PRAVACHOL) 40 MG tablet Take 40 mg by mouth at bedtime.     . senna-docusate (SENOKOT-S) 8.6-50 MG tablet Take 1 tablet by mouth at bedtime. 30 tablet 0  . vitamin E 400 UNIT capsule Take 400 Units by mouth daily after lunch.    . pantoprazole (PROTONIX) 40 MG tablet Take 40 mg by mouth daily after lunch.      No facility-administered medications prior to visit.        Objective:   Physical Exam Vitals:   03/19/19 1619  Pulse: 77  SpO2: 95%  Weight: 146 lb (66.2 kg)  Height: 4\' 11"  (1.499 m)   Gen: Pleasant, elderly woman, in no distress,  normal affect  ENT: No lesions,  mouth clear,  oropharynx clear, no postnasal drip, poor dentition  Neck: No JVD, no stridor  Lungs: No use of accessory muscles, bibasilar insp crackles.   Cardiovascular: RRR, heart sounds normal, no murmur or gallops, no peripheral edema  Musculoskeletal: No deformities, no cyanosis or clubbing  Neuro: alert, non focal  Skin: Warm, no lesions or rashes       Assessment & Plan:  Obstructive lung disease (generalized) ([place]) Mild based on her prior pulmonary  function testing.  She does not use and has not benefited significantly from albuterol.  I do not believe there is any indication to start scheduled bronchodilators at this time.  ILD  (interstitial lung disease) ([place]) Likely due to chronic intermittent aspiration, question possible contribution of amiodarone toxicity as well.  The amiodarone was stopped last year.  Has been stable by imaging, last was about 1 year ago.  Also stable clinically.  She did not desaturate on ambulatory oximetry today and we will not need to continue her exertional O2.  She needs to continue her nocturnal oxygen.  Baltazar Apo, MD, PhD 03/19/2019, 5:27 PM [facility] [phone] or if no answer [phone]

## 2019-03-19 NOTE — Patient Instructions (Addendum)
Your walking oxygen level today was normal.  You do not need to use oxygen with exertion anymore. You do need to continue to use oxygen while sleeping as recommended by Dr. Deforest Hoyles. We do not need to start any scheduled inhaler medications right now.  Please continue to keep your albuterol available to use 2 puffs if you need it for shortness of breath. Practice swallowing precautions to the best of your ability to avoid recurrent aspiration. Follow with Dr. Lamonte Sakai in 12 months or sooner if you have any problems.

## 2019-03-19 NOTE — Assessment & Plan Note (Signed)
Likely due to chronic intermittent aspiration, question possible contribution of amiodarone toxicity as well.  The amiodarone was stopped last year.  Has been stable by imaging, last was about 1 year ago.  Also stable clinically.  She did not desaturate on ambulatory oximetry today and we will not need to continue her exertional O2.  She needs to continue her nocturnal oxygen.

## 2019-03-19 NOTE — Assessment & Plan Note (Signed)
Mild based on her prior pulmonary function testing.  She does not use and has not benefited significantly from albuterol.  I do not believe there is any indication to start scheduled bronchodilators at this time.

## 2019-03-31 ENCOUNTER — Other Ambulatory Visit: Payer: Self-pay | Admitting: Interventional Cardiology

## 2019-04-01 NOTE — Progress Notes (Signed)
PPM remote 

## 2019-04-07 DIAGNOSIS — N390 Urinary tract infection, site not specified: Secondary | ICD-10-CM | POA: Diagnosis not present

## 2019-04-09 ENCOUNTER — Other Ambulatory Visit: Payer: Self-pay | Admitting: Interventional Cardiology

## 2019-04-16 ENCOUNTER — Telehealth: Payer: Self-pay | Admitting: Interventional Cardiology

## 2019-04-16 NOTE — Telephone Encounter (Signed)
New Message °

## 2019-05-01 ENCOUNTER — Ambulatory Visit: Payer: PPO | Attending: Internal Medicine

## 2019-05-01 DIAGNOSIS — Z23 Encounter for immunization: Secondary | ICD-10-CM | POA: Insufficient documentation

## 2019-05-01 NOTE — Progress Notes (Signed)
   Covid-19 Vaccination Clinic  Name:  Laura Zuniga    MRN: [id number] DOB: 07-Sep-1917  05/01/2019  [name] was observed post Covid-19 immunization for 30 minutes based on pre-vaccination screening without incidence. She was provided with Vaccine Information Sheet and instruction to access the V-Safe system.   [name] was instructed to call 911 with any severe reactions post vaccine: Marland Kitchen Difficulty breathing  . Swelling of your face and throat  . A fast heartbeat  . A bad rash all over your body  . Dizziness and weakness    Immunizations Administered    Name Date Dose VIS Date Route   Pfizer COVID-19 Vaccine 05/01/2019 11:51 AM 0.3 mL 03/20/2019 Intramuscular   Manufacturer: [place]   Lot: [id number]   [place]: [id number]

## 2019-05-05 ENCOUNTER — Other Ambulatory Visit: Payer: Self-pay | Admitting: Interventional Cardiology

## 2019-05-22 ENCOUNTER — Ambulatory Visit: Payer: PPO | Attending: Internal Medicine

## 2019-05-22 DIAGNOSIS — Z23 Encounter for immunization: Secondary | ICD-10-CM

## 2019-05-22 NOTE — Progress Notes (Signed)
   Covid-19 Vaccination Clinic  Name:  Laura Zuniga    MRN: [id number] DOB: 07-02-1917  05/22/2019  [name] was observed post Covid-19 immunization for 30 minutes based on pre-vaccination screening without incidence. She was provided with Vaccine Information Sheet and instruction to access the V-Safe system.   [name] was instructed to call 911 with any severe reactions post vaccine: Marland Kitchen Difficulty breathing  . Swelling of your face and throat  . A fast heartbeat  . A bad rash all over your body  . Dizziness and weakness    Immunizations Administered    Name Date Dose VIS Date Route   Pfizer COVID-19 Vaccine 05/22/2019  5:45 PM 0.3 mL 03/20/2019 Intramuscular   Manufacturer: [place]   Lot: [id number]   [place]: [id number]

## 2019-06-01 ENCOUNTER — Other Ambulatory Visit: Payer: Self-pay | Admitting: Interventional Cardiology

## 2019-06-08 ENCOUNTER — Ambulatory Visit (INDEPENDENT_AMBULATORY_CARE_PROVIDER_SITE_OTHER): Payer: PPO | Admitting: *Deleted

## 2019-06-08 DIAGNOSIS — Z950 Presence of cardiac pacemaker: Secondary | ICD-10-CM | POA: Diagnosis not present

## 2019-06-08 LAB — CUP PACEART REMOTE DEVICE CHECK
Battery Remaining Longevity: 105 mo
Battery Remaining Percentage: 95.5 %
Battery Voltage: 2.99 V
Brady Statistic AP VP Percent: 1 %
Brady Statistic AP VS Percent: 81 %
Brady Statistic AS VP Percent: 1 %
Brady Statistic AS VS Percent: 19 %
Brady Statistic RA Percent Paced: 81 %
Brady Statistic RV Percent Paced: 1 %
Date Time Interrogation Session: 20210301051725
Implantable Lead Implant Date: 20170207
Implantable Lead Implant Date: 20170207
Implantable Lead Location: 753859
Implantable Lead Location: 753860
Implantable Pulse Generator Implant Date: 20170207
Lead Channel Impedance Value: 390 Ohm
Lead Channel Impedance Value: 440 Ohm
Lead Channel Pacing Threshold Amplitude: 0.75 V
Lead Channel Pacing Threshold Amplitude: 1.25 V
Lead Channel Pacing Threshold Pulse Width: 0.5 ms
Lead Channel Pacing Threshold Pulse Width: 0.6 ms
Lead Channel Sensing Intrinsic Amplitude: 3.4 mV
Lead Channel Sensing Intrinsic Amplitude: 5.5 mV
Lead Channel Setting Pacing Amplitude: 2.5 V
Lead Channel Setting Pacing Amplitude: 2.5 V
Lead Channel Setting Pacing Pulse Width: 0.5 ms
Lead Channel Setting Sensing Sensitivity: 2 mV
Pulse Gen Model: 2240
Pulse Gen Serial Number: 7866856

## 2019-06-08 NOTE — Progress Notes (Signed)
PPM Remote  

## 2019-06-22 ENCOUNTER — Other Ambulatory Visit: Payer: Self-pay | Admitting: General Practice

## 2019-06-22 NOTE — Patient Outreach (Signed)
Client is newly enrolled in the Special Needs Plan program with Congestive Heart Failure. No Health Risk Assessment on file, Individualized Care Plan (ICP) completed from information in the EMR. Client also has a history of Atrial Fibrillation, COPD, Hypercholesterolemia and CAD, last ED visit was 05/25/2018, no recent ED or acute care admissions. Will send introductory letter with ICP to the primary provider and client, along with educational materials. Assigned RN Care Coordinator will follow up in 3 months.

## 2019-06-30 ENCOUNTER — Other Ambulatory Visit: Payer: Self-pay | Admitting: Interventional Cardiology

## 2019-07-06 ENCOUNTER — Other Ambulatory Visit: Payer: Self-pay | Admitting: Interventional Cardiology

## 2019-07-16 ENCOUNTER — Other Ambulatory Visit: Payer: Self-pay | Admitting: *Deleted

## 2019-07-16 DIAGNOSIS — I509 Heart failure, unspecified: Secondary | ICD-10-CM

## 2019-07-16 NOTE — Patient Outreach (Addendum)
[facility]) Care Management Chronic Special Needs Program  07/16/2019  Name: Laura Zuniga DOB: 07/16/1917  MRN: [id number]  Laura Zuniga is enrolled in a chronic special needs plan for Heart Failure. Chronic Care Management Coordinator telephoned client/ daughter to complete health risk assessment and to develop individualized care plan.  Introduced the chronic care management program, importance of client participation, and taking their care plan to all provider appointments and inpatient facilities.  Reviewed the transition of care process and possible referral to community care management.  RN care manager completed health risk assessment with client's daughter Laura Zuniga.  Subjective: Client's daughter Laura Zuniga reports she lives with client and assist her with all aspects of care, client has macular degeneration and will be 86 years old next month, she is blind in left eye, does not wear glasses because it does not help and states " everything has been done".  Laura Zuniga states she is power of attorney but not sure if this if for health care or financial and requests documents be sent to her and she will review and may complete.  Client does not weigh daily or record, only weighs if her feet swell and has been instructed to take extra lasix.  Caregiver states they would be willing to try and weigh every morning and record.  Laura Zuniga states she and client could benefit from "some help in the home", Laura Zuniga states she would like to know if her mother qualifies for medicaid and would like additional resources/ assistance with this process from Education officer, museum.  Laura Zuniga reports client does have "some dementia", has [facility] visiting once monthly x 3 months.    Goals Addressed            This Visit's Progress   .  Acknowledge receipt of Building surveyor mailed advanced directives packet to client's home RN care manager ask daughter to call if she needs  any assistance with completing. RN care manager mailed EMMI education article" Advanced Directives"    . "I would like in home help" (pt-stated)       RN care manager placed order for social worker to contact patient's daughter Laura Zuniga for resources, assistance with medicaid application and assistance in the home. RN care manager gave contact number for HTA conciegre for any questions related to benefits.    . Client verbalize knowledge of Heart Failure disease self management skills:       Knows the signs and symptoms of heart failure, notify doctor for any new or worsening symptoms, call 911 for severe symptoms, weigh daily and record weights and follow a low salt diet.  Reviewed information in the [employer] calendar education tool of heart failure zones and when to call the doctor. RN care manager mailed EMMI education articles "Heart Failure and Atrial Fibrillation" and "Heart failure- keeping track of our weight daily" Please weight same time each day in the morning, on hard surface, empty bladder and weigh before getting dressed or eating.    . Client will have ADL/IADL needs addressed by 6 months       RN care manager placed order for social worker to assist with any resources for assistance in the home. RN care manager reviewed with client's daughter about resources / support for caregivers. Client does require assistance with ADL/ IADL's    . Client will report no worsening of symptoms of Atrial Fibrillation within the next 12 months  Please review A-fib Action Plan in the back of [employer] calendar. Please report to your health care provider any worsening of symptoms such as racing or irregular heart beat, dizziness, or weakness. Call 911 for emergency such as chest pain, fainting, struggling to breathe. Please follow up with your health care provider as recommended.     . Client will report no worsening of symptoms related to heart disease within  the next 12 months       Continue to follow a low salt diet Please follow up with your primary care provider and cardiologist, have any needed labwork completed    . Client will verbalize knowledge of chronic lung disease as evidenced by no ED visits or Inpatient stays related to chronic lung disease        Please review COPD action plan and zones in the [employer] calendar educational tool. Please take all medications as prescribed Follow up with pulmonary doctor as recommended    . Client/Caregiver will verbalize understanding of instructions related to self-care and safety       Client has had one fall in the last 3 months Please use equipment such as walker and wheelchair as instructed, do not get in a hurry, keep pathways clear and always ask for assistance as needed.    . Maintain timely refills of Heart Failure medication as prescribed within the year        Take medications as prescribed. Follow up with your health care provider if you have any questions. Please contact your assigned RN care manager if you have difficulty obtaining medications. Review of electronic medical record medication dispense report indicates client maintains timely refills of heart failure medication.     . Obtain annual  Lipid Profile, LDL-C       Per medical record review, lipid profile completed 08/04/18 LDL=96 Try to avoid saturated fats, trans-fats and eat more fiber. Continue to follow up with your health care provider for any needed lab work.     . Visit Primary Care Provider, Pulmonologist and Cardiologist at least 2 times per year       Dr. Malvin Johns, Pulmonology appt on 03/19/2019.  Dr. Lovena Le cardiologist visits completed 2 visits in 2020 Continue to follow up with specialists      Plan:  RN care manager completed online Health Risk Assessment. Reviewed plan of care with daughter Laura Zuniga, client will be a tier 2 at present due to fall in last 3 months and advanced age.  Client  followed by [facility]. RN care manager reviewed using 24 hour nurse advice line as needed. RN care manager faxed letter with individualized care plan to primary care provider and mailed successful outreach letter to client including consent form, advanced directives packet and EMMI education articles.  Chronic care management coordination will outreach in:  6 months.  Will refer client to:  [place] worker.  Jacqlyn Larsen [facility], BSN [facility] Community Care Coordinator [phone]     Kassie Mends Nursing/RN Coord [facility] Case Manager, C-SNP  [phone]

## 2019-07-20 ENCOUNTER — Other Ambulatory Visit: Payer: Self-pay

## 2019-07-20 ENCOUNTER — Other Ambulatory Visit: Payer: Self-pay | Admitting: *Deleted

## 2019-07-20 ENCOUNTER — Encounter: Payer: Self-pay | Admitting: *Deleted

## 2019-07-20 NOTE — Patient Outreach (Signed)
[facility]) Care Management  07/20/2019  Rasheda [facility] [birth year] [id number]   CSW made an attempt to try and contact patient today to perform the initial phone assessment, as well as assess and assist with social work needs and services, without success.  A HIPAA compliant message was left for patient with her son-in-law, who reported that patient was with her daughter, [name] at a doctors appointment at the time of CSW's call.  CSW left a HIPAA compliant message for patient with her son-in-law and is currently awaiting a return call.  CSW will make a second outreach attempt within the next 3-4 business days, if a return call is not received from patient in the meantime.  CSW will also mail an Outreach Letter to patient's home requesting that patient contact CSW if she is interested in receiving social work services through [place] with Scientist, clinical (histocompatibility and immunogenetics).  Nat Christen, BSW, MSW, LCSW  Licensed Education officer, environmental Health System  Mailing [place] [address] [place], [place] [address] Physical Address-[address], [place], [place] [address] Toll Free Main # [phone] Fax # [phone] Cell # [phone]  Office # [phone] [email or web address]

## 2019-07-21 ENCOUNTER — Telehealth: Payer: Self-pay | Admitting: Interventional Cardiology

## 2019-07-21 ENCOUNTER — Encounter: Payer: Self-pay | Admitting: *Deleted

## 2019-07-21 ENCOUNTER — Other Ambulatory Visit: Payer: Self-pay | Admitting: *Deleted

## 2019-07-21 MED ORDER — ISOSORBIDE MONONITRATE ER 60 MG PO TB24: ORAL_TABLET | ORAL | 0 refills | Status: DC

## 2019-07-21 NOTE — Telephone Encounter (Signed)
°*  STAT* If patient is at the pharmacy, call can be transferred to refill team.   1. Which medications need to be refilled? (please list name of each medication and dose if known)  isosorbide mononitrate (IMDUR) 60 MG 24 hr tablet  2. Which pharmacy/location (including street and city if local pharmacy) is medication to be sent to? [place], [address]  3. Do they need a 30 day or 90 day supply? 90 day supply   Patient only has two tablets left. An appointment has been scheduled for 07/30/19.

## 2019-07-21 NOTE — Patient Outreach (Signed)
[facility]) Care Management  07/21/2019  Thella Mcclaine [facility] 07-21-1917 [id number]   CSW received an incoming call from patient's daughter, Gregary Cromer today, in response to the HIPAA compliant message left for patient with her son-in-law yesterday.  CSW was able to perform the initial phone assessment with Mrs. Gertie Fey, as well as assess and assist with social work needs and services.  CSW introduced self, explained role and types of services provided through [facility] ([facility]).  CSW further explained to Mrs. Gertie Fey that [place] works with patient's RNCM, also with [facility], Jacqlyn Larsen.  CSW then explained the reason for the call, indicating that Mrs. Mare Ferrari thought that patient would benefit from social work services and resources to assist with completion of an Adult Medicaid application, as well as assist with arranging in-home care services for patient.  CSW obtained two HIPAA compliant identifiers from Mrs. Gertie Fey, which included patient's name and date of birth.  Mrs. Tomlin admitted that [place] really needed to speak with her sister, [name], patient's other daughter and individual with whom patient currently resides, as Mrs. Clyda Greener is patient's primary caregiver and [place], making all decisions on patient's behalf.  Mrs. Gertie Fey went on to say that Mrs. Clyda Greener is currently out-of-town, but that she would have Mrs. Tolbert return CSW's call at her earliest convenience.  Mrs. Gertie Fey reported that she is staying with patient until Mrs. Clyda Greener returns from out-of-town, as patient has Dementia and currently requires 24 hour care and supervision.  Mrs. Gertie Fey stated, "I know that mom and [name] don't always see eye-to-eye on everything, with regards to mom's care, but I know [name] could definitely benefit from help with mom in the home".  Mrs. Tomlin denied needing assistance with placement for patient, of any  kind, indicating that patient will remain in the home with Mrs. Tolbert for as long as possible.    Per Mrs. Farmer's request, CSW agreed to go ahead and mail patient and Mrs. Tolbert an Adult Medicaid application, through the [place], as well as Location manager for [employer] Forensic scientist), through the [place], and [employer]), through [employer]  CSW then explained to Mrs. Gertie Fey that if CSW does not receive a return call from Mrs. Tolbert within the next week, CSW will make arrangements to contact her again on Tuesday, July 28, 2019, around 9:00am.  Mrs. Tomlin voiced understanding and was agreeable to this plan, apologetic for not having more insight into patient's current plan of care or in-home care needs.  CSW will also mail Mrs. Tolbert information about Adult Day Care Programs for patient, including [facility] ([facility]) and PACE (Program [facility] [place] for the Elderly).  All of the following resource information and applications have been mailed to patient's home today: Medicaid Application Adult [place] for Ongoing Illness Application PCS (Personal Care Services) Instructions PCS (Personal Care Services) Application PCS (Personal Care Services) Providers CAPS Forensic scientist) Instructions CAPS Psychologist, prison and probation services Alternative Program Services) Application  Nat Christen, BSW, MSW, LCSW  Licensed Clinical Social Worker  [place]  Mailing [place] [place], [place] [address] Physical Address-[address], [place], [place] [address] Toll Free Main # [phone] Fax # [phone] Cell # [phone]  Office # [phone] [email or web address]

## 2019-07-21 NOTE — Telephone Encounter (Signed)
Pt's medication was sent to pt's pharmacy as requested. Confirmation received.  °

## 2019-07-23 ENCOUNTER — Ambulatory Visit: Payer: PPO | Admitting: *Deleted

## 2019-07-24 ENCOUNTER — Ambulatory Visit: Payer: Self-pay | Admitting: *Deleted

## 2019-07-28 ENCOUNTER — Encounter: Payer: Self-pay | Admitting: *Deleted

## 2019-07-28 ENCOUNTER — Other Ambulatory Visit: Payer: Self-pay | Admitting: *Deleted

## 2019-07-28 NOTE — Patient Outreach (Signed)
[facility]) Care Management  07/28/2019  Laura Zuniga [facility] 07/10/1917 [id number]  CSW was able to make contact with patient's daughter, and individual with whom patient currently resides, [name] today to follow-up regarding social work services and resources for patient, as well as to confirm that she received the packet of resource information mailed to her home by CSW.  Mrs. Clyda Greener confirmed receipt of the packet, but denied having had an opportunity to review the contents, admitting that she just returned home from vacation, not having had a chance to go through her mail yet.  Mrs. Clyda Greener stated, "I do remember seeing a large packet from [facility]".  Mrs. Clyda Greener reported that she plans to go through her mail within the next few days, requesting to take down CSW's contact information, agreeing to contact CSW if she has any questions about the information received, or if she needs assistance with completion of the applications.   CSW explained to Mrs. Clyda Greener that all of the following resource information and applications would be enclosed in the packet:  Medicaid Application; Adult Day Care Centers; Personal Care for Ongoing Illness Application; PCS (Personal Care Services) Instructions; PCS (Personal Care Services) Application; PCS (Personal Care Services) Providers; CAPS Forensic scientist) Instructions; CAPS Forensic scientist) Application.  Mrs. Tolbert voiced understanding, admitting that she may decide to not even pursue any type of in-home care services for patient, "due to all the hassle".  Mrs. Clyda Greener went on to explain that she highly doubts that patient would even qualify for Medicaid, based on her [place], assets, etc.  Last, Mrs. Clyda Greener indicated that she does not wish to have patient attend an Adult Day Care Program, for fear of patient contracting COVID-19.  CSW inquired about placement for patient, but Mrs.  Tolbert adamantly declined, verbalizing that her and her sister, Gregary Cromer, individual that [place] spoke with last week, refuse to place patient into a long-term care facility, of any kind, wanting for patient to remain at home.  CSW will perform a case closure on patient, as all goals of treatment have been met from social work standpoint and no additional social work needs have been identified at this time.  CSW will notify patient's RNCM with [facility], Jacqlyn Larsen of CSW's plans to close patient's case.  CSW will fax an update to patient's Primary Care Physician, Dr. Wenda Low to ensure that they are aware of CSW's involvement with patient's plan of care.  Mrs. Clyda Greener was most appreciative of the call, indicating that she plans to speak with Mrs. Tomlin to discuss in-home care services for patient.  Nat Christen, BSW, MSW, LCSW  Licensed Education officer, environmental Health System  Mailing [place] [address], [place], [place] [address] Physical Address-[address], [place], [place] [address] Toll Free Main # [phone] Fax # [phone] Cell # [phone]  Office # [phone] [email or web address]

## 2019-07-29 NOTE — Progress Notes (Signed)
Cardiology Office Note   Date:  07/30/2019   ID:  Laura Zuniga, DOB 1917/09/03, MRN [id number]  PCP:  Wenda Low, MD    No chief complaint on file.  CAD  Wt Readings from Last 3 Encounters:  07/30/19 144 lb 1.9 oz (65.4 kg)  03/19/19 146 lb (66.2 kg)  05/25/18 150 lb (68 kg)       History of Present Illness: Laura Zuniga is a 86 y.o. female  with a hx of presumed CAD (anterior wall abnormality on nuclear imaging in [id number]), diastolic CHF, atrial fibrillation, COPD, HTN. She spends part of her year in [place] and part in [place]. Patient was admitted 6/15 with C. Difficile colitis complicated by atrial fibrillation with RVR. She was placed on amiodarone. She was not felt to be a good candidate for anticoagulation. She converted to NSR. She was then readmitted 6/16 with acute on chronic diastolic CHF. She was diuresed with IV Lasix and d/c back to SNF. FU Holter in 8/15 with NSR, occ junctional rhythm, lowest HR 48; No AFib.   Seen in12/16 to evaluate bradycardia. She was back in AF with SVR (HR in 40s). She did have orthostatic intol but Ortho VS were ok. Case was reviewed with Dr. Virl Axe and she was taken off of Amiodarone. Holter monitor was placed NSR, sinus brady, junctional rhythm and longest pause 2.4 seconds.  She subsequently had symptomatic bradycardia with a pacer placed.  Follows with pulmonary for COPD.  Since the last visit, she had a fall.    Denies : Chest pain. Dizziness. Leg edema. Nitroglycerin use. Orthopnea. Palpitations. Paroxysmal nocturnal dyspnea. Shortness of breath. Syncope.     Past Medical History:  Diagnosis Date  . Bradycardia    a. Amiodarone DC'd 12/16 >> b. Holter 1/17 - NSR, sinus brady, junctional rhythm, pause 2.4 s  . C. difficile colitis 2015  . CAD (coronary artery disease)    never had cardiac cath, CAD dx by positive ant. wall defect on Nuc. study 2007 >>  no cath due to high risk/advanced age  . Chronic  diastolic CHF (congestive heart failure) ([place])    a. Echo 6/15 - Mild LVH, EF 55-60%, mild AI, mild MR, moderate LAE, mild RAE, PASP 52 mm Hg  . CKD (chronic kidney disease)   . COPD (chronic obstructive pulmonary disease) ([place])   . Diverticulitis   . GERD (gastroesophageal reflux disease)   . Hypertension   . Macular degeneration   . PAF (paroxysmal atrial fibrillation) ([facility])    not optimal candidate for anticoagulation    Past Surgical History:  Procedure Laterality Date  . APPENDECTOMY    . CHOLECYSTECTOMY    . EP IMPLANTABLE DEVICE N/A 05/17/2015   Procedure: Pacemaker Implant;  Surgeon: Evans Lance, MD;  Location: [facility];  Service: Cardiovascular;  Laterality: N/A;  . ESOPHAGOGASTRODUODENOSCOPY N/A 09/22/2013   Procedure: ESOPHAGOGASTRODUODENOSCOPY (EGD);  Surgeon: Lear Ng, MD;  Location: [facility] ENDOSCOPY;  Service: Endoscopy;  Laterality: N/A;  . ESOPHAGOGASTRODUODENOSCOPY N/A 09/22/2013   Procedure: ESOPHAGOGASTRODUODENOSCOPY (EGD);  Surgeon: Lear Ng, MD;  Location: [facility] ENDOSCOPY;  Service: Endoscopy;  Laterality: N/A;  . EYE SURGERY    . FLEXIBLE SIGMOIDOSCOPY N/A 09/22/2013   Procedure: FLEXIBLE SIGMOIDOSCOPY;  Surgeon: Lear Ng, MD;  Location: [facility] ENDOSCOPY;  Service: Endoscopy;  Laterality: N/A;  . FLEXIBLE SIGMOIDOSCOPY N/A 09/22/2013   Procedure: FLEXIBLE SIGMOIDOSCOPY;  Surgeon: Lear Ng, MD;  Location: [facility];  Service:  Endoscopy;  Laterality: N/A;  unprepped/ egd first  . VAGINAL HYSTERECTOMY       Current Outpatient Medications  Medication Sig Dispense Refill  . acetaminophen (TYLENOL) 500 MG tablet Take 2 tablets (1,000 mg total) by mouth every 6 (six) hours as needed for mild pain or moderate pain. 30 tablet 0  . albuterol (PROVENTIL HFA;VENTOLIN HFA) 108 (90 BASE) MCG/ACT inhaler Inhale 2 puffs into the lungs every 6 (six) hours as needed for wheezing or shortness of breath. 1 Inhaler 3  . albuterol  (PROVENTIL) (2.5 MG/3ML) 0.083% nebulizer solution Take 2.5 mg by nebulization every 4 (four) hours as needed for wheezing or shortness of breath.     . Ascorbic Acid (VITAMIN C) 1000 MG tablet Take 1,000 mg by mouth daily after lunch.    . benzonatate (TESSALON) 100 MG capsule Take 1 capsule (100 mg total) by mouth every 8 (eight) hours. 21 capsule 0  . Biotin 1 MG CAPS Take 1 mg by mouth daily after lunch.     . Calcium Carb-Cholecalciferol (CALCIUM 600-D PO) Take 1 tablet by mouth at bedtime.    . Carboxymethylcellulose Sodium (EYE DROPS OP) Place 1 drop into both eyes at bedtime as needed (dry eyes).     . clopidogrel (PLAVIX) 75 MG tablet Take 1 tablet by mouth once daily 90 tablet 0  . Cranberry 250 MG CAPS Take 250 mg by mouth daily after lunch.     . docusate sodium (COLACE) 100 MG capsule Take 100-200 mg by mouth at bedtime as needed for mild constipation.     Marland Kitchen escitalopram (LEXAPRO) 10 MG tablet Take 10 mg by mouth daily.     . ferrous sulfate 325 (65 FE) MG tablet Take 325 mg by mouth daily after lunch.    . furosemide (LASIX) 80 MG tablet Take 40-80 mg by mouth See admin instructions. Take one tablet (80 mg) by mouth every morning and 1/2 tablet (40 mg) every afternoon, may increase afternoon dose to 1 tablet (80 mg) as needed for fluid or weight gain    . hydrALAZINE (APRESOLINE) 25 MG tablet Take 1 tablet (25 mg total) by mouth 3 (three) times daily. 180 tablet 0  . isosorbide mononitrate (IMDUR) 60 MG 24 hr tablet TAKE 1 TABLET BY MOUTH ONCE DAILY. Please keep upcoming appt with Dr. Irish Lack in April before anymore refills. Thank you 90 tablet 0  . lansoprazole (PREVACID) 30 MG capsule Take 30 mg by mouth daily at 12 noon.    Marland Kitchen levothyroxine (SYNTHROID, LEVOTHROID) 50 MCG tablet Take 50 mcg by mouth daily before breakfast.     . Multiple Vitamins-Minerals (ICAPS AREDS 2 PO) Take 1 tablet by mouth daily after lunch.     . nitroGLYCERIN (NITROSTAT) 0.4 MG SL tablet Place 0.4 mg under  the tongue every 5 (five) minutes as needed for chest pain.    . pantoprazole (PROTONIX) 40 MG tablet pantoprazole 40 mg tablet,delayed release  TAKE 1 TABLET BY MOUTH ONCE DAILY FOR 90 DAYS    . potassium chloride SA (K-DUR,KLOR-CON) 20 MEQ tablet Take 20 mEq by mouth See admin instructions. Take one tablet (20 meq) by mouth twice daily - morning and afternoon    . pravastatin (PRAVACHOL) 40 MG tablet Take 40 mg by mouth at bedtime.     . predniSONE (DELTASONE) 5 MG tablet Take 5 mg by mouth daily.    Marland Kitchen senna-docusate (SENOKOT-S) 8.6-50 MG tablet Take 1 tablet by mouth at bedtime. 30 tablet 0  .  vitamin E 400 UNIT capsule Take 400 Units by mouth daily after lunch.     No current facility-administered medications for this visit.    Allergies:   Penicillins; 2,4-d dimethylamine (amisol); Ace inhibitors; Lanolin; Sulfa antibiotics; Vancomycin; and Sulfonamide derivatives    Social History:  The patient  reports that she has never smoked. She has never used smokeless tobacco. She reports that she does not drink alcohol or use drugs.   Family History:  The patient's family history includes COPD in her brother and sister; Cancer in her brother, brother, brother, sister, and sister; Cirrhosis in her brother; Heart attack in her sister; Heart attack (age of onset: 34) in her father; Heart disease in her brother; Hypertension in her daughter.    ROS:  Please see the history of present illness.   Otherwise, review of systems are positive for chronic DOE.   All other systems are reviewed and negative.    PHYSICAL EXAM: VS:  BP 126/60   Pulse 62   Ht 4\' 11"  (1.499 m)   Wt 144 lb 1.9 oz (65.4 kg)   SpO2 95%   BMI 29.11 kg/m  , BMI Body mass index is 29.11 kg/m. GEN: Well nourished, well developed, in no acute distress  HEENT: normal  Neck: no JVD, carotid bruits, or masses Cardiac: RRR; 2/6 systolic murmurs, no rubs, or gallops,chronic left ankle edema  Respiratory:  clear to auscultation  bilaterally, normal work of breathing GI: soft, nontender, nondistended, + BS MS: no deformity or atrophy  Skin: warm and dry, no rash Neuro:  Strength and sensation are intact Psych: euthymic mood, full affect   EKG:   The ekg ordered today demonstrates NSR, no ST changes   Recent Labs: No results found for requested labs within last 8760 hours.   Lipid Panel    Component Value Date/Time   CHOL 143 06/28/2011 2230   TRIG 177 (H) 06/28/2011 2230   HDL 38 (L) 06/28/2011 2230   CHOLHDL 3.8 06/28/2011 2230   VLDL 35 06/28/2011 2230   LDLCALC 70 06/28/2011 2230     Other studies Reviewed: Additional studies/ records that were reviewed today with results demonstrating: PMD labs reviewed.   ASSESSMENT AND PLAN:  1. CAD: No angina. Continue aggressive secondary prevention.   2. Chronic diastolic heart failure:  Appears euvolemic.  3. Hyperlipidemia: Followed 4. HTN: The current medical regimen is effective;  continue present plan and medications. 5. AFib: We have held Amio due to interstitial lung disease.  In NSR. Avoid falls.   Current medicines are reviewed at length with the patient today.  The patient concerns regarding her medicines were addressed.  The following changes have been made:  No change  Labs/ tests ordered today include:  No orders of the defined types were placed in this encounter.   Recommend 150 minutes/week of aerobic exercise Low fat, low carb, high fiber diet recommended  Disposition:   FU in 1 year   Signed, Larae Grooms, MD  07/30/2019 2:22 PM    [facility] [address], [place], [place]  [address] Phone: [phone]; Fax: [phone]

## 2019-07-30 ENCOUNTER — Encounter: Payer: Self-pay | Admitting: Interventional Cardiology

## 2019-07-30 ENCOUNTER — Ambulatory Visit (INDEPENDENT_AMBULATORY_CARE_PROVIDER_SITE_OTHER): Payer: HMO | Admitting: Interventional Cardiology

## 2019-07-30 ENCOUNTER — Other Ambulatory Visit: Payer: Self-pay

## 2019-07-30 VITALS — BP 126/60 | HR 62 | Ht 59.0 in | Wt 144.1 lb

## 2019-07-30 DIAGNOSIS — I480 Paroxysmal atrial fibrillation: Secondary | ICD-10-CM

## 2019-07-30 DIAGNOSIS — I25118 Atherosclerotic heart disease of native coronary artery with other forms of angina pectoris: Secondary | ICD-10-CM

## 2019-07-30 DIAGNOSIS — I5032 Chronic diastolic (congestive) heart failure: Secondary | ICD-10-CM | POA: Diagnosis not present

## 2019-07-30 DIAGNOSIS — Z950 Presence of cardiac pacemaker: Secondary | ICD-10-CM | POA: Diagnosis not present

## 2019-07-30 NOTE — Patient Instructions (Signed)

## 2019-09-08 LAB — CUP PACEART REMOTE DEVICE CHECK
Battery Remaining Longevity: 106 mo
Battery Remaining Percentage: 95.5 %
Battery Voltage: 2.98 V
Brady Statistic AP VP Percent: 1 %
Brady Statistic AP VS Percent: 81 %
Brady Statistic AS VP Percent: 1 %
Brady Statistic AS VS Percent: 19 %
Brady Statistic RA Percent Paced: 81 %
Brady Statistic RV Percent Paced: 1 %
Date Time Interrogation Session: 20210531025548
Implantable Lead Implant Date: 20170207
Implantable Lead Implant Date: 20170207
Implantable Lead Location: 753859
Implantable Lead Location: 753860
Implantable Pulse Generator Implant Date: 20170207
Lead Channel Impedance Value: 400 Ohm
Lead Channel Impedance Value: 480 Ohm
Lead Channel Pacing Threshold Amplitude: 0.75 V
Lead Channel Pacing Threshold Amplitude: 1.25 V
Lead Channel Pacing Threshold Pulse Width: 0.5 ms
Lead Channel Pacing Threshold Pulse Width: 0.6 ms
Lead Channel Sensing Intrinsic Amplitude: 2.9 mV
Lead Channel Sensing Intrinsic Amplitude: 5.7 mV
Lead Channel Setting Pacing Amplitude: 2.5 V
Lead Channel Setting Pacing Amplitude: 2.5 V
Lead Channel Setting Pacing Pulse Width: 0.5 ms
Lead Channel Setting Sensing Sensitivity: 2 mV
Pulse Gen Model: 2240
Pulse Gen Serial Number: 7866856

## 2019-09-09 ENCOUNTER — Ambulatory Visit (INDEPENDENT_AMBULATORY_CARE_PROVIDER_SITE_OTHER): Payer: HMO | Admitting: *Deleted

## 2019-09-09 DIAGNOSIS — I495 Sick sinus syndrome: Secondary | ICD-10-CM | POA: Diagnosis not present

## 2019-09-10 NOTE — Progress Notes (Signed)
Remote pacemaker transmission.   

## 2019-09-27 ENCOUNTER — Other Ambulatory Visit: Payer: Self-pay | Admitting: Interventional Cardiology

## 2019-10-07 ENCOUNTER — Other Ambulatory Visit (HOSPITAL_COMMUNITY): Payer: Self-pay | Admitting: Internal Medicine

## 2019-10-07 ENCOUNTER — Ambulatory Visit (HOSPITAL_COMMUNITY): Payer: HMO

## 2019-10-07 DIAGNOSIS — M79605 Pain in left leg: Secondary | ICD-10-CM

## 2019-10-09 ENCOUNTER — Ambulatory Visit (HOSPITAL_COMMUNITY)
Admission: RE | Admit: 2019-10-09 | Discharge: 2019-10-09 | Disposition: A | Discharge status: Home / Self Care | Payer: HMO | Source: Ambulatory Visit | Attending: Internal Medicine | Admitting: Internal Medicine

## 2019-10-09 ENCOUNTER — Other Ambulatory Visit: Payer: Self-pay

## 2019-10-09 DIAGNOSIS — M7989 Other specified soft tissue disorders: Secondary | ICD-10-CM | POA: Diagnosis not present

## 2019-10-09 DIAGNOSIS — M79605 Pain in left leg: Secondary | ICD-10-CM | POA: Insufficient documentation

## 2019-10-09 NOTE — Progress Notes (Signed)
Left lower extremity venous duplex has been completed. Preliminary results can be found in CV Proc through chart review.  Results were given to Dr. Glenna Durand office.  10/09/19 1:04 PM Laura Zuniga RVT

## 2019-10-20 ENCOUNTER — Other Ambulatory Visit: Payer: Self-pay | Admitting: Interventional Cardiology

## 2019-10-26 ENCOUNTER — Telehealth: Payer: Self-pay | Admitting: Emergency Medicine

## 2019-10-26 NOTE — Telephone Encounter (Signed)
Spoke with daughter Christen Butter listed), she will have her mother call clinic to assess symptoms. Patient had 4 episodes of fast Hr on 10/24/19 between 1248 and 1443 . The longest episode appeared to be AFL that lasted 2 hours and 9 minutes. History of similar issues in the past.Brenda will have patient call office.Patient overdue for follow-up with Dr Lovena Le and will be receiving call to place on schedule to be seen. To call Hassan Rowan to make appointment at [phone].

## 2019-10-26 NOTE — Telephone Encounter (Signed)
Patient's daughter [name] called and reports patient may have been making her bed during the time of the AMS and HVR recorded on 10/24/19. She reports patient has less energy and is SOB with any activity. [name] requested that scheduler contact her at [phone] to schedule overdue follow up. She was given ED precautions for worsening cardiac symptoms and will call the office if her mother has any change in condition.

## 2019-10-27 NOTE — Telephone Encounter (Signed)
followup with me in next couple of weeks. GT

## 2019-11-24 ENCOUNTER — Encounter: Payer: Self-pay | Admitting: Internal Medicine

## 2019-11-24 ENCOUNTER — Other Ambulatory Visit: Payer: Self-pay

## 2019-11-24 ENCOUNTER — Ambulatory Visit (INDEPENDENT_AMBULATORY_CARE_PROVIDER_SITE_OTHER): Payer: HMO | Admitting: Internal Medicine

## 2019-11-24 VITALS — BP 128/60 | HR 63 | Ht 60.0 in | Wt 141.8 lb

## 2019-11-24 DIAGNOSIS — Z950 Presence of cardiac pacemaker: Secondary | ICD-10-CM

## 2019-11-24 DIAGNOSIS — I480 Paroxysmal atrial fibrillation: Secondary | ICD-10-CM | POA: Diagnosis not present

## 2019-11-24 DIAGNOSIS — I495 Sick sinus syndrome: Secondary | ICD-10-CM

## 2019-11-24 MED ORDER — RIVAROXABAN 15 MG PO TABS: 15.0000 mg | ORAL_TABLET | Freq: Every day | ORAL | 11 refills | Status: AC

## 2019-11-24 NOTE — Patient Instructions (Addendum)
Medication Instructions:  Your physician has recommended you make the following change in your medication:   1.  STOP taking PLAVIX  2.  START taking XARELTO 15 mg-  Take one tablet by mouth daily with food.  Labwork: None ordered.  Testing/Procedures: None ordered.  Follow-Up: Your physician wants you to follow-up in: 6 months with Dr. Lovena Le.   You will receive a reminder letter in the mail two months in advance. If you don't receive a letter, please call our office to schedule the follow-up appointment.  Remote monitoring is used to monitor your Pacemaker from home. This monitoring reduces the number of office visits required to check your device to one time per year. It allows [place] to keep an eye on the functioning of your device to ensure it is working properly. You are scheduled for a device check from home on 12/09/2019. You may send your transmission at any time that day. If you have a wireless device, the transmission will be sent automatically. After your physician reviews your transmission, you will receive a postcard with your next transmission date.  Any Other Special Instructions Will Be Listed Below (If Applicable).  If you need a refill on your cardiac medications before your next appointment, please call your pharmacy.   Rivaroxaban oral tablets What is this medicine? RIVAROXABAN (ri va ROX a ban) is an anticoagulant (blood thinner). It is used to treat blood clots in the lungs or in the veins. It is also used to prevent blood clots in the lungs or in the veins. It is also used to lower the chance of stroke in people with a medical condition called atrial fibrillation. This medicine may be used for other purposes; ask your health care provider or pharmacist if you have questions. COMMON BRAND NAME(S): Xarelto, Xarelto Starter Pack What should I tell my health care provider before I take this medicine? They need to know if you have any of these conditions:  antiphospholipid  antibody syndrome  artificial heart valve  bleeding disorders  bleeding in the brain  blood in your stools (black or tarry stools) or if you have blood in your vomit  history of blood clots  history of stomach bleeding  kidney disease  liver disease  low blood counts, like low white cell, platelet, or red cell counts  recent or planned spinal or epidural procedure  take medicines that treat or prevent blood clots  an unusual or allergic reaction to rivaroxaban, other medicines, foods, dyes, or preservatives  pregnant or trying to get pregnant  breast-feeding How should I use this medicine? Take this medicine by mouth with a glass of water. Follow the directions on the prescription label. Take your medicine at regular intervals. Do not take it more often than directed. Do not stop taking except on your doctor's advice. Stopping this medicine may increase your risk of a blood clot. Be sure to refill your prescription before you run out of medicine. If you are taking this medicine after hip or knee replacement surgery, take it with or without food. If you are taking this medicine for atrial fibrillation, take it with your evening meal. If you are taking this medicine to treat blood clots, take it with food at the same time each day. If you are unable to swallow your tablet, you may crush the tablet and mix it in applesauce. Then, immediately eat the applesauce. You should eat more food right after you eat the applesauce containing the crushed tablet. Talk to  your pediatrician regarding the use of this medicine in children. Special care may be needed. Overdosage: If you think you have taken too much of this medicine contact a poison control center or emergency room at once. NOTE: This medicine is only for you. Do not share this medicine with others. What if I miss a dose? If you take your medicine once a day and miss a dose, take the missed dose as soon as you remember. If it is  almost time for your next dose, take only that dose. Do not take double or extra doses. If you take your medicine twice a day and miss a dose, take the missed dose immediately. In this instance, 2 tablets may be taken at the same time. The next day you should take 1 tablet twice a day as directed. What may interact with this medicine? Do not take this medicine with any of the following medications:  defibrotide This medicine may also interact with the following medications:  aspirin and aspirin-like medicines  certain antibiotics like erythromycin, azithromycin, and clarithromycin  certain medicines for fungal infections like ketoconazole and itraconazole  certain medicines for irregular heart beat like amiodarone, quinidine, dronedarone  certain medicines for seizures like carbamazepine, phenytoin  certain medicines that treat or prevent blood clots like warfarin, enoxaparin, and dalteparin  conivaptan  felodipine  indinavir  lopinavir; ritonavir  NSAIDS, medicines for pain and inflammation, like ibuprofen or naproxen  ranolazine  rifampin  ritonavir  SNRIs, medicines for depression, like desvenlafaxine, duloxetine, levomilnacipran, venlafaxine  SSRIs, medicines for depression, like citalopram, escitalopram, fluoxetine, fluvoxamine, paroxetine, sertraline  St. John's wort  verapamil This list may not describe all possible interactions. Give your health care provider a list of all the medicines, herbs, non-prescription drugs, or dietary supplements you use. Also tell them if you smoke, drink alcohol, or use illegal drugs. Some items may interact with your medicine. What should I watch for while using this medicine? Visit your healthcare professional for regular checks on your progress. You may need blood work done while you are taking this medicine. Your condition will be monitored carefully while you are receiving this medicine. It is important not to miss any  appointments. Avoid sports and activities that might cause injury while you are using this medicine. Severe falls or injuries can cause unseen bleeding. Be careful when using sharp tools or knives. Consider using an Copy. Take special care brushing or flossing your teeth. Report any injuries, bruising, or red spots on the skin to your healthcare professional. If you are going to need surgery or other procedure, tell your healthcare professional that you are taking this medicine. Wear a medical ID bracelet or chain. Carry a card that describes your disease and details of your medicine and dosage times. What side effects may I notice from receiving this medicine? Side effects that you should report to your doctor or health care professional as soon as possible:  allergic reactions like skin rash, itching or hives, swelling of the face, lips, or tongue  back pain  redness, blistering, peeling or loosening of the skin, including inside the mouth  signs and symptoms of bleeding such as bloody or black, tarry stools; red or dark-brown urine; spitting up blood or brown material that looks like coffee grounds; red spots on the skin; unusual bruising or bleeding from the eye, gums, or nose  signs and symptoms of a blood clot such as chest pain; shortness of breath; pain, swelling, or warmth in the leg  signs and symptoms of a stroke such as changes in vision; confusion; trouble speaking or understanding; severe headaches; sudden numbness or weakness of the face, arm or leg; trouble walking; dizziness; loss of coordination Side effects that usually do not require medical attention (report to your doctor or health care professional if they continue or are bothersome):  dizziness  muscle pain This list may not describe all possible side effects. Call your doctor for medical advice about side effects. You may report side effects to FDA at 1-800-FDA-1088. Where should I keep my medicine? Keep  out of the reach of children. Store at room temperature between 15 and 30 degrees C (59 and 86 degrees F). Throw away any unused medicine after the expiration date. NOTE: This sheet is a summary. It may not cover all possible information. If you have questions about this medicine, talk to your doctor, pharmacist, or health care provider.  2020 Elsevier/Gold Standard (2018-06-23 09:45:59)

## 2019-11-24 NOTE — Progress Notes (Signed)
HPI [name] returns today for followup. She is a pleasant 86 yo woman with a h/o sinus node dysfunction, chronic dyspnea, and peripheral edema. She has been noted to have PAF in the past. When I last saw her we discussed anti-coagulation. She has been on plavix with no bleeding. She is at risk of bleeding however due to her age. She has not fallen. She has some anginal symptoms. Her daughter notes that she has had "mini strokes" that she characterizes as brief episodes where she doesn't talk.  Allergies  Allergen Reactions  . Penicillins Other (See Comments)    Unknown allergic reaction Has patient had a PCN reaction causing immediate rash, facial/tongue/throat swelling, SOB or lightheadedness with hypotension: unknown Has patient had a PCN reaction causing severe rash involving mucus membranes or skin necrosis: unknown Has patient had a PCN reaction that required hospitalization: unknown Has patient had a PCN reaction occurring within the last 10 years: unknown If all of the above answers are "NO", then may proceed with Cephalosporin use.   . 2,4-D Dimethylamine (Amisol) Other (See Comments)    Unknown reaction  . Ace Inhibitors Other (See Comments)    Unknown reaction  . Lanolin Hives  . Sulfa Antibiotics Other (See Comments)    Unknown reaction - maybe hives or nausea  . Vancomycin Other (See Comments)    Red man syndrome  . Sulfonamide Derivatives Swelling and Rash     Current Outpatient Medications  Medication Sig Dispense Refill  . acetaminophen (TYLENOL) 500 MG tablet Take 2 tablets (1,000 mg total) by mouth every 6 (six) hours as needed for mild pain or moderate pain. 30 tablet 0  . albuterol (PROVENTIL HFA;VENTOLIN HFA) 108 (90 BASE) MCG/ACT inhaler Inhale 2 puffs into the lungs every 6 (six) hours as needed for wheezing or shortness of breath. 1 Inhaler 3  . albuterol (PROVENTIL) (2.5 MG/3ML) 0.083% nebulizer solution Take 2.5 mg by nebulization every 4 (four)  hours as needed for wheezing or shortness of breath.     . Ascorbic Acid (VITAMIN C) 1000 MG tablet Take 1,000 mg by mouth daily after lunch.    . benzonatate (TESSALON) 100 MG capsule Take 1 capsule (100 mg total) by mouth every 8 (eight) hours. 21 capsule 0  . Biotin 1 MG CAPS Take 1 mg by mouth daily after lunch.     . Calcium Carb-Cholecalciferol (CALCIUM 600-D PO) Take 1 tablet by mouth at bedtime.    . Carboxymethylcellulose Sodium (EYE DROPS OP) Place 1 drop into both eyes at bedtime as needed (dry eyes).     . clopidogrel (PLAVIX) 75 MG tablet Take 1 tablet by mouth once daily 90 tablet 2  . Cranberry 250 MG CAPS Take 250 mg by mouth daily after lunch.     . escitalopram (LEXAPRO) 10 MG tablet Take 10 mg by mouth daily.     . ferrous sulfate 325 (65 FE) MG tablet Take 325 mg by mouth daily after lunch.    . furosemide (LASIX) 80 MG tablet Take 40-80 mg by mouth See admin instructions. Take one tablet (80 mg) by mouth every morning and 1/2 tablet (40 mg) every afternoon, may increase afternoon dose to 1 tablet (80 mg) as needed for fluid or weight gain    . hydrALAZINE (APRESOLINE) 25 MG tablet Take 1 tablet (25 mg total) by mouth 3 (three) times daily. 180 tablet 0  . isosorbide mononitrate (IMDUR) 60 MG 24 hr tablet TAKE 1 TABLET  BY MOUTH ONCE DAILY . APPOINTMENT REQUIRED FOR FUTURE REFILLS 90 tablet 2  . lansoprazole (PREVACID) 30 MG capsule Take 30 mg by mouth daily at 12 noon.    Marland Kitchen levothyroxine (SYNTHROID, LEVOTHROID) 50 MCG tablet Take 50 mcg by mouth daily before breakfast.     . Multiple Vitamins-Minerals (ICAPS AREDS 2 PO) Take 1 tablet by mouth daily after lunch.     . nitroGLYCERIN (NITROSTAT) 0.4 MG SL tablet Place 0.4 mg under the tongue every 5 (five) minutes as needed for chest pain.    . pantoprazole (PROTONIX) 40 MG tablet pantoprazole 40 mg tablet,delayed release  TAKE 1 TABLET BY MOUTH ONCE DAILY FOR 90 DAYS    . potassium chloride SA (K-DUR,KLOR-CON) 20 MEQ tablet Take  20 mEq by mouth See admin instructions. Take one tablet (20 meq) by mouth twice daily - morning and afternoon    . pravastatin (PRAVACHOL) 40 MG tablet Take 40 mg by mouth at bedtime.     . predniSONE (DELTASONE) 5 MG tablet Take 5 mg by mouth daily.    Marland Kitchen senna-docusate (SENOKOT-S) 8.6-50 MG tablet Take 1 tablet by mouth at bedtime. 30 tablet 0  . vitamin E 400 UNIT capsule Take 400 Units by mouth daily after lunch.     No current facility-administered medications for this visit.     Past Medical History:  Diagnosis Date  . Bradycardia    a. Amiodarone DC'd 12/16 >> b. Holter 1/17 - NSR, sinus brady, junctional rhythm, pause 2.4 s  . C. difficile colitis 2015  . CAD (coronary artery disease)    never had cardiac cath, CAD dx by positive ant. wall defect on Nuc. study 2007 >>  no cath due to high risk/advanced age  . Chronic diastolic CHF (congestive heart failure) ([place])    a. Echo 6/15 - Mild LVH, EF 55-60%, mild AI, mild MR, moderate LAE, mild RAE, PASP 52 mm Hg  . CKD (chronic kidney disease)   . COPD (chronic obstructive pulmonary disease) ([place])   . Diverticulitis   . GERD (gastroesophageal reflux disease)   . Hypertension   . Macular degeneration   . PAF (paroxysmal atrial fibrillation) ([facility])    not optimal candidate for anticoagulation    ROS:   All systems reviewed and negative except as noted in the HPI.   Past Surgical History:  Procedure Laterality Date  . APPENDECTOMY    . CHOLECYSTECTOMY    . EP IMPLANTABLE DEVICE N/A 05/17/2015   Procedure: Pacemaker Implant;  Surgeon: Evans Lance, MD;  Location: [facility];  Service: Cardiovascular;  Laterality: N/A;  . ESOPHAGOGASTRODUODENOSCOPY N/A 09/22/2013   Procedure: ESOPHAGOGASTRODUODENOSCOPY (EGD);  Surgeon: Lear Ng, MD;  Location: [facility] ENDOSCOPY;  Service: Endoscopy;  Laterality: N/A;  . ESOPHAGOGASTRODUODENOSCOPY N/A 09/22/2013   Procedure: ESOPHAGOGASTRODUODENOSCOPY (EGD);  Surgeon: Lear Ng, MD;  Location: [facility] ENDOSCOPY;  Service: Endoscopy;  Laterality: N/A;  . EYE SURGERY    . FLEXIBLE SIGMOIDOSCOPY N/A 09/22/2013   Procedure: FLEXIBLE SIGMOIDOSCOPY;  Surgeon: Lear Ng, MD;  Location: [facility] ENDOSCOPY;  Service: Endoscopy;  Laterality: N/A;  . FLEXIBLE SIGMOIDOSCOPY N/A 09/22/2013   Procedure: FLEXIBLE SIGMOIDOSCOPY;  Surgeon: Lear Ng, MD;  Location: [facility] ENDOSCOPY;  Service: Endoscopy;  Laterality: N/A;  unprepped/ egd first  . VAGINAL HYSTERECTOMY       Family History  Problem Relation Age of Onset  . Heart attack Father 23  . Heart attack Sister   . Cancer Brother   .  Cancer Brother   . Cirrhosis Brother   . Heart disease Brother   . Cancer Brother   . COPD Brother   . COPD Sister   . Cancer Sister   . Cancer Sister   . Hypertension Daughter   . Stroke Neg Hx      Social History   Socioeconomic History  . Marital status: Widowed    Spouse name: Not on file  . Number of children: 2  . Years of education: 25  . Highest education level: 12th grade  Occupational History  . Occupation: Retired  Tobacco Use  . Smoking status: Never Smoker  . Smokeless tobacco: Never Used  Vaping Use  . Vaping Use: Never used  Substance and Sexual Activity  . Alcohol use: No    Alcohol/week: 0.0 standard drinks  . Drug use: No  . Sexual activity: Never  Other Topics Concern  . Not on file  Social History Narrative        father died age 5: Massive MI         mother died age 30: gallbladder surgery complications         4 brothers: all deceased; prostate ca, throat ca, emphesema, MI, cirrhosis from ETOH         3 sisters: one living: MI, emphesema, 2 sisters with breast CA   Social Determinants of Health   Financial Resource Strain: Low Risk   . Difficulty of Paying Living Expenses: Not hard at all  Food Insecurity: No Food Insecurity  . Worried About Charity fundraiser in the Last Year: Never true  . Ran Out of Food in the Last Year:  Never true  Transportation Needs: No Transportation Needs  . Lack of Transportation (Medical): No  . Lack of Transportation (Non-Medical): No  Physical Activity: Inactive  . Days of Exercise per Week: 0 days  . Minutes of Exercise per Session: 0 min  Stress: No Stress Concern Present  . Feeling of Stress : Not at all  Social Connections: Moderately Integrated  . Frequency of Communication with Friends and Family: More than three times a week  . Frequency of Social Gatherings with Friends and Family: More than three times a week  . Attends Religious Services: More than 4 times per year  . Active Member of Clubs or Organizations: Yes  . Attends Archivist Meetings: More than 4 times per year  . Marital Status: Widowed  Intimate Partner Violence: Not At Risk  . Fear of Current or Ex-Partner: No  . Emotionally Abused: No  . Physically Abused: No  . Sexually Abused: No     BP 128/60   Pulse 63   Ht 5' (1.524 m)   Wt 141 lb 12.8 oz (64.3 kg)   SpO2 96%   BMI 27.69 kg/m   Physical Exam:  Well appearing elderly woman, NAD HEENT: Unremarkable Neck:  6 cm JVD, no thyromegally Lymphatics:  No adenopathy Back:  No CVA tenderness Lungs:  Clear with no wheezes HEART:  Regular rate rhythm, no murmurs, no rubs, no clicks Abd:  soft, positive bowel sounds, no organomegally, no rebound, no guarding Ext:  2 plus pulses, no edema, no cyanosis, no clubbing Skin:  No rashes no nodules Neuro:  CN II through XII intact, motor grossly intact  EKG - none  DEVICE  Normal device function.  See PaceArt for details.   Assess/Plan: 1. Sinus node dysfunction - she is asymptomatic, s/p PPM insertion 2. PAF -  we discussed the treatment options in detail. She is at risk for both bleeding and clotting. As she has not bled on plavix and has not fallen and may be having some mild neuro symptoms of cerebral ischemia, and is clearly having PAF, I have recommended a trial of Xarelto and  stopping the plavix. 3. PPM - her [employer] DDD PM is working normally.  4. Venous insufficiency - her leg swelling is improved and appears essentially resolved on exam.  Salome Spotted.

## 2019-12-09 ENCOUNTER — Ambulatory Visit (INDEPENDENT_AMBULATORY_CARE_PROVIDER_SITE_OTHER): Payer: HMO | Admitting: *Deleted

## 2019-12-09 DIAGNOSIS — I495 Sick sinus syndrome: Secondary | ICD-10-CM

## 2019-12-10 LAB — CUP PACEART REMOTE DEVICE CHECK
Battery Remaining Longevity: 98 mo
Battery Remaining Percentage: 95.5 %
Battery Voltage: 2.96 V
Brady Statistic AP VP Percent: 1 %
Brady Statistic AP VS Percent: 80 %
Brady Statistic AS VP Percent: 1 %
Brady Statistic AS VS Percent: 20 %
Brady Statistic RA Percent Paced: 79 %
Brady Statistic RV Percent Paced: 1 %
Date Time Interrogation Session: 20210902053632
Implantable Lead Implant Date: 20170207
Implantable Lead Implant Date: 20170207
Implantable Lead Location: 753859
Implantable Lead Location: 753860
Implantable Pulse Generator Implant Date: 20170207
Lead Channel Impedance Value: 390 Ohm
Lead Channel Impedance Value: 400 Ohm
Lead Channel Pacing Threshold Amplitude: 0.75 V
Lead Channel Pacing Threshold Amplitude: 1 V
Lead Channel Pacing Threshold Pulse Width: 0.5 ms
Lead Channel Pacing Threshold Pulse Width: 0.6 ms
Lead Channel Sensing Intrinsic Amplitude: 2.7 mV
Lead Channel Sensing Intrinsic Amplitude: 5.6 mV
Lead Channel Setting Pacing Amplitude: 2.5 V
Lead Channel Setting Pacing Amplitude: 2.5 V
Lead Channel Setting Pacing Pulse Width: 0.5 ms
Lead Channel Setting Sensing Sensitivity: 2 mV
Pulse Gen Model: 2240
Pulse Gen Serial Number: 7866856

## 2019-12-10 NOTE — Progress Notes (Signed)
Remote pacemaker transmission.   

## 2019-12-22 ENCOUNTER — Telehealth: Payer: Self-pay | Admitting: Internal Medicine

## 2019-12-22 NOTE — Telephone Encounter (Signed)
LMOVM for Judson Roch requesting call back to DC.  Able to reach pt's daughter, Hilda Blades.  Hilda Blades reports they unplugged pt's home monitor and they were concerned it would alert [place].  Clarified that pt's PPM has not been turned off.    Hilda Blades requested return kit for [employer].  Confirmed mailing address.  Email request sent to [employer].  Future transmissions canceled.  Debra verbalized appreciation of call and assistance.  She denies additional questions or concerns at this time.

## 2019-12-22 NOTE — Telephone Encounter (Signed)
Laura Zuniga, Hospice nurse called to let us know patient is actively dying.  Family has turned off her pacemaker they wanted to make [place] aware since we are monitoring it.

## 2019-12-23 ENCOUNTER — Telehealth: Payer: Self-pay

## 2019-12-23 NOTE — Telephone Encounter (Signed)
The Hospice nurse Sarah called to let us know the pt is actively dying and been having some A-fib. She did not want [place] to try to prevent the A-fib because the pt may die today. I told her if the monitor is unplug we will not be able to see the A-fib. The last transmission we received was 12-10-2019.

## 2019-12-28 ENCOUNTER — Other Ambulatory Visit: Payer: Self-pay | Admitting: *Deleted

## 2019-12-28 NOTE — Patient Outreach (Signed)
  [facility]) Care Management Chronic Special Needs Program    12/28/2019  Name: Laura Zuniga, DOB: April 13, 1917  MRN: [id number]   [name] is enrolled in a chronic special needs plan for Congestive Heart Failure.  RN care manager received notification client deceased as of 01-22-20.  Case closure letter and today's note faxed to primary care provider.  Case closed  Jacqlyn Larsen [facility], BSN [place], [place]

## 2020-01-08 DEATH — deceased

## 2020-01-14 ENCOUNTER — Ambulatory Visit: Payer: PPO | Admitting: *Deleted
